# Patient Record
Sex: Female | Born: 1940 | Race: White | Hispanic: No | Marital: Married | Smoking: Never smoker
Health system: Southern US, Community
[De-identification: ages and names within clinical notes are randomized; demographics above are authoritative.]

## PROBLEM LIST (undated history)

## (undated) DIAGNOSIS — F419 Anxiety disorder, unspecified: Secondary | ICD-10-CM

## (undated) DIAGNOSIS — M810 Age-related osteoporosis without current pathological fracture: Secondary | ICD-10-CM

## (undated) DIAGNOSIS — T8859XA Other complications of anesthesia, initial encounter: Secondary | ICD-10-CM

## (undated) DIAGNOSIS — I10 Essential (primary) hypertension: Secondary | ICD-10-CM

## (undated) DIAGNOSIS — S52202A Unspecified fracture of shaft of left ulna, initial encounter for closed fracture: Secondary | ICD-10-CM

## (undated) DIAGNOSIS — E119 Type 2 diabetes mellitus without complications: Secondary | ICD-10-CM

## (undated) DIAGNOSIS — T4145XA Adverse effect of unspecified anesthetic, initial encounter: Secondary | ICD-10-CM

## (undated) DIAGNOSIS — M1990 Unspecified osteoarthritis, unspecified site: Secondary | ICD-10-CM

## (undated) DIAGNOSIS — E039 Hypothyroidism, unspecified: Secondary | ICD-10-CM

## (undated) DIAGNOSIS — M719 Bursopathy, unspecified: Secondary | ICD-10-CM

## (undated) DIAGNOSIS — S5292XA Unspecified fracture of left forearm, initial encounter for closed fracture: Secondary | ICD-10-CM

## (undated) DIAGNOSIS — C50919 Malignant neoplasm of unspecified site of unspecified female breast: Secondary | ICD-10-CM

## (undated) DIAGNOSIS — Z9889 Other specified postprocedural states: Secondary | ICD-10-CM

## (undated) DIAGNOSIS — F32A Depression, unspecified: Secondary | ICD-10-CM

## (undated) DIAGNOSIS — F329 Major depressive disorder, single episode, unspecified: Secondary | ICD-10-CM

## (undated) DIAGNOSIS — E079 Disorder of thyroid, unspecified: Secondary | ICD-10-CM

## (undated) DIAGNOSIS — K219 Gastro-esophageal reflux disease without esophagitis: Secondary | ICD-10-CM

## (undated) DIAGNOSIS — E785 Hyperlipidemia, unspecified: Secondary | ICD-10-CM

## (undated) DIAGNOSIS — R112 Nausea with vomiting, unspecified: Secondary | ICD-10-CM

## (undated) HISTORY — DX: Malignant neoplasm of unspecified site of unspecified female breast: C50.919

## (undated) HISTORY — DX: Essential (primary) hypertension: I10

## (undated) HISTORY — DX: Major depressive disorder, single episode, unspecified: F32.9

## (undated) HISTORY — DX: Bursopathy, unspecified: M71.9

## (undated) HISTORY — DX: Depression, unspecified: F32.A

## (undated) HISTORY — DX: Age-related osteoporosis without current pathological fracture: M81.0

## (undated) HISTORY — DX: Type 2 diabetes mellitus without complications: E11.9

## (undated) HISTORY — PX: CHOLECYSTECTOMY: SHX55

## (undated) HISTORY — DX: Disorder of thyroid, unspecified: E07.9

## (undated) HISTORY — DX: Hyperlipidemia, unspecified: E78.5

---

## 1981-08-23 DIAGNOSIS — C50919 Malignant neoplasm of unspecified site of unspecified female breast: Secondary | ICD-10-CM

## 1981-08-23 HISTORY — DX: Malignant neoplasm of unspecified site of unspecified female breast: C50.919

## 1981-08-23 HISTORY — PX: MASTECTOMY: SHX3

## 1988-08-23 HISTORY — PX: CATARACT EXTRACTION: SUR2

## 1998-12-17 ENCOUNTER — Encounter: Payer: Self-pay | Admitting: Ophthalmology

## 1998-12-17 ENCOUNTER — Ambulatory Visit (HOSPITAL_COMMUNITY): Admission: RE | Admit: 1998-12-17 | Discharge: 1998-12-18 | Payer: Self-pay | Admitting: Ophthalmology

## 1999-05-08 ENCOUNTER — Ambulatory Visit (HOSPITAL_COMMUNITY): Admission: RE | Admit: 1999-05-08 | Discharge: 1999-05-09 | Payer: Self-pay | Admitting: Surgery

## 1999-05-08 ENCOUNTER — Encounter: Payer: Self-pay | Admitting: Surgery

## 2007-10-10 ENCOUNTER — Ambulatory Visit: Payer: Self-pay | Admitting: General Surgery

## 2007-10-10 HISTORY — PX: BREAST BIOPSY: SHX20

## 2010-12-01 ENCOUNTER — Other Ambulatory Visit: Payer: Self-pay | Admitting: *Deleted

## 2010-12-01 DIAGNOSIS — Z79899 Other long term (current) drug therapy: Secondary | ICD-10-CM

## 2010-12-01 DIAGNOSIS — E7800 Pure hypercholesterolemia, unspecified: Secondary | ICD-10-CM

## 2010-12-02 ENCOUNTER — Ambulatory Visit: Payer: Self-pay | Admitting: Cardiology

## 2010-12-02 ENCOUNTER — Other Ambulatory Visit (INDEPENDENT_AMBULATORY_CARE_PROVIDER_SITE_OTHER): Payer: Self-pay | Admitting: *Deleted

## 2010-12-02 DIAGNOSIS — E7800 Pure hypercholesterolemia, unspecified: Secondary | ICD-10-CM

## 2010-12-02 DIAGNOSIS — Z79899 Other long term (current) drug therapy: Secondary | ICD-10-CM

## 2014-09-03 ENCOUNTER — Ambulatory Visit: Payer: Self-pay | Admitting: Family Medicine

## 2014-10-10 ENCOUNTER — Ambulatory Visit: Payer: Self-pay | Admitting: Family Medicine

## 2015-03-18 ENCOUNTER — Telehealth: Payer: Self-pay | Admitting: Family Medicine

## 2015-03-18 NOTE — Telephone Encounter (Signed)
Pt needs refill on all medications. Metformin,Glipizide, Lisinopril, Synthroid,Sertraline, Zithromax to be sent to [facility].

## 2015-03-18 NOTE — Telephone Encounter (Signed)
Spoke with Patient and she has refills on medication called [facility] and had them refilled.

## 2015-04-25 ENCOUNTER — Ambulatory Visit (INDEPENDENT_AMBULATORY_CARE_PROVIDER_SITE_OTHER): Payer: Medicare PPO | Admitting: Family Medicine

## 2015-04-25 ENCOUNTER — Encounter: Payer: Self-pay | Admitting: Family Medicine

## 2015-04-25 VITALS — BP 138/77 | HR 80 | Temp 98.2°F | Resp 18 | Ht 61.0 in | Wt 193.8 lb

## 2015-04-25 DIAGNOSIS — E785 Hyperlipidemia, unspecified: Secondary | ICD-10-CM

## 2015-04-25 DIAGNOSIS — Z9181 History of falling: Secondary | ICD-10-CM | POA: Insufficient documentation

## 2015-04-25 DIAGNOSIS — I10 Essential (primary) hypertension: Secondary | ICD-10-CM | POA: Diagnosis not present

## 2015-04-25 DIAGNOSIS — E119 Type 2 diabetes mellitus without complications: Secondary | ICD-10-CM

## 2015-04-25 DIAGNOSIS — F339 Major depressive disorder, recurrent, unspecified: Secondary | ICD-10-CM | POA: Insufficient documentation

## 2015-04-25 DIAGNOSIS — N951 Menopausal and female climacteric states: Secondary | ICD-10-CM | POA: Insufficient documentation

## 2015-04-25 DIAGNOSIS — E1169 Type 2 diabetes mellitus with other specified complication: Secondary | ICD-10-CM | POA: Insufficient documentation

## 2015-04-25 DIAGNOSIS — E1129 Type 2 diabetes mellitus with other diabetic kidney complication: Secondary | ICD-10-CM | POA: Insufficient documentation

## 2015-04-25 DIAGNOSIS — E039 Hypothyroidism, unspecified: Secondary | ICD-10-CM | POA: Insufficient documentation

## 2015-04-25 DIAGNOSIS — R809 Proteinuria, unspecified: Secondary | ICD-10-CM

## 2015-04-25 DIAGNOSIS — F329 Major depressive disorder, single episode, unspecified: Secondary | ICD-10-CM | POA: Insufficient documentation

## 2015-04-25 HISTORY — DX: Type 2 diabetes mellitus without complications: E11.9

## 2015-04-25 MED ORDER — LISINOPRIL 20 MG PO TABS: 20.0000 mg | ORAL_TABLET | Freq: Every day | ORAL | Status: DC

## 2015-04-25 MED ORDER — METFORMIN HCL 500 MG PO TABS: 1000.0000 mg | ORAL_TABLET | Freq: Two times a day (BID) | ORAL | Status: DC

## 2015-04-25 MED ORDER — ATORVASTATIN CALCIUM 40 MG PO TABS: 40.0000 mg | ORAL_TABLET | Freq: Every day | ORAL | Status: DC

## 2015-04-25 MED ORDER — GLIPIZIDE ER 2.5 MG PO TB24: 2.5000 mg | ORAL_TABLET | Freq: Every day | ORAL | Status: DC

## 2015-04-25 NOTE — Progress Notes (Signed)
Name: Lindsey [name]   MRN: [id number]    DOB: [birth year]   Date:04/25/2015       Progress Note  Subjective  Chief Complaint  Chief Complaint  Patient presents with  . Follow-up    3 mo  . Diabetes  . Depression  . Hyperlipidemia    Diabetes She presents for her follow-up diabetic visit. She has type 2 diabetes mellitus. Pertinent negatives for hypoglycemia include no headaches. Pertinent negatives for diabetes include no blurred vision, no chest pain and no fatigue. Pertinent negatives for diabetic complications include no CVA. Current diabetic treatment includes oral agent (dual therapy).  Hyperlipidemia This is a chronic problem. The problem is controlled. Recent lipid tests were reviewed and are normal. Pertinent negatives include no chest pain, leg pain, myalgias or shortness of breath. Current antihyperlipidemic treatment includes statins.  Hypertension This is a chronic problem. The problem is controlled. Pertinent negatives include no blurred vision, chest pain, headaches, palpitations or shortness of breath. Past treatments include ACE inhibitors. There is no history of kidney disease, CAD/MI or CVA.    Past Medical History  Diagnosis Date  . Depression   . Diabetes mellitus without complication   . Hyperlipidemia   . Osteoporosis     Past Surgical History  Procedure Laterality Date  . Mastectomy Right 1983    Family History  Problem Relation Age of Onset  . Kidney disease Mother   . Heart disease Father   . Stroke Sister   . Cancer Sister     breast  . Stroke Brother   . Diabetes Brother   . Hypertension Brother     Social History   Social History  . Marital Status: Married    Spouse Name: N/A  . Number of Children: N/A  . Years of Education: N/A   Occupational History  . Not on file.   Social History Main Topics  . Smoking status: Never Smoker   . Smokeless tobacco: Never Used  . Alcohol Use: No  . Drug Use: No  . Sexual Activity: No   Other  Topics Concern  . Not on file   Social History Narrative  . No narrative on file     Current outpatient prescriptions:  .  aspirin 81 MG tablet, Take by mouth., Disp: , Rfl:  .  atorvastatin (LIPITOR) 40 MG tablet, Take by mouth., Disp: , Rfl:  .  cholecalciferol (VITAMIN D) 1000 UNITS tablet, Take by mouth., Disp: , Rfl:  .  glipiZIDE (GLUCOTROL XL) 2.5 MG 24 hr tablet, Take by mouth., Disp: , Rfl:  .  lisinopril (PRINIVIL,ZESTRIL) 20 MG tablet, Take by mouth., Disp: , Rfl:  .  metFORMIN (GLUCOPHAGE) 500 MG tablet, Take by mouth., Disp: , Rfl:  .  mupirocin ointment (BACTROBAN) 2 %, BACTROBAN, 2% (External Ointment)  1 (one) Ointment two times daily for 30 days  Quantity: 45;  Refills: 2   Ordered :19-Jul-2012  Dione Booze ;  Started 18-Apr-2012 Active, Disp: , Rfl:  .  naproxen sodium (ALEVE) 220 MG tablet, Take by mouth., Disp: , Rfl:  .  sertraline (ZOLOFT) 100 MG tablet, Take by mouth., Disp: , Rfl:  .  SYNTHROID 112 MCG tablet, , Disp: , Rfl: 1  No Known Allergies   Review of Systems  Constitutional: Negative for fatigue.  Eyes: Negative for blurred vision.  Respiratory: Negative for shortness of breath.   Cardiovascular: Negative for chest pain and palpitations.  Musculoskeletal: Negative for myalgias.  Neurological: Negative for headaches.  Objective  Filed Vitals:   04/25/15 1006  BP: 138/77  Pulse: 80  Temp: 98.2 F (36.8 C)  TempSrc: Oral  Resp: 18  Height: 5\' 1"  (1.549 m)  Weight: 193 lb 12.8 oz (87.907 kg)  SpO2: 95%    Physical Exam  Constitutional: She is oriented to person, place, and time and well-developed, well-nourished, and in no distress.  Cardiovascular: Normal rate and regular rhythm.   Pulmonary/Chest: Effort normal and breath sounds normal.  Abdominal: Soft. Bowel sounds are normal.  Neurological: She is alert and oriented to person, place, and time.  Skin: Skin is warm and dry.  Nursing note and vitals reviewed.  Assessment &  Plan  1. Diabetes mellitus without complication  - glipiZIDE (GLUCOTROL XL) 2.5 MG 24 hr tablet; Take 1 tablet (2.5 mg total) by mouth daily with breakfast.  Dispense: 90 tablet; Refill: 1 - metFORMIN (GLUCOPHAGE) 500 MG tablet; Take 2 tablets (1,000 mg total) by mouth 2 (two) times daily with a meal.  Dispense: 360 tablet; Refill: 1 - HgB A1c  2. Hyperlipidemia  - atorvastatin (LIPITOR) 40 MG tablet; Take 1 tablet (40 mg total) by mouth daily at 6 PM.  Dispense: 90 tablet; Refill: 1 - Lipid Profile - Comprehensive Metabolic Panel (CMET)  3. Benign hypertension  - lisinopril (PRINIVIL,ZESTRIL) 20 MG tablet; Take 1 tablet (20 mg total) by mouth daily.  Dispense: 90 tablet; Refill: 1   Trameka Dorough [facility] 04/25/2015 10:18 AM

## 2015-04-26 LAB — LIPID PANEL
CHOL/HDL RATIO: 4.4 ratio (ref 0.0–4.4)
Cholesterol, Total: 227 mg/dL — ABNORMAL HIGH (ref 100–199)
HDL: 52 mg/dL (ref >39)
LDL Calculated: 142 mg/dL — ABNORMAL HIGH (ref 0–99)
Triglycerides: 167 mg/dL — ABNORMAL HIGH (ref 0–149)
VLDL CHOLESTEROL CAL: 33 mg/dL (ref 5–40)

## 2015-04-26 LAB — COMPREHENSIVE METABOLIC PANEL
ALK PHOS: 57 IU/L (ref 39–117)
ALT: 16 IU/L (ref 0–32)
AST: 19 IU/L (ref 0–40)
Albumin/Globulin Ratio: 1.9 (ref 1.1–2.5)
Albumin: 4.7 g/dL (ref 3.5–4.8)
BILIRUBIN TOTAL: 0.7 mg/dL (ref 0.0–1.2)
BUN/Creatinine Ratio: 20 (ref 11–26)
BUN: 17 mg/dL (ref 8–27)
CHLORIDE: 102 mmol/L (ref 97–108)
CO2: 26 mmol/L (ref 18–29)
Calcium: 9.9 mg/dL (ref 8.7–10.3)
Creatinine, Ser: 0.85 mg/dL (ref 0.57–1.00)
GFR calc Af Amer: 78 mL/min/{1.73_m2} (ref >59)
GFR calc non Af Amer: 67 mL/min/{1.73_m2} (ref >59)
GLUCOSE: 136 mg/dL — AB (ref 65–99)
Globulin, Total: 2.5 g/dL (ref 1.5–4.5)
Potassium: 5.8 mmol/L — ABNORMAL HIGH (ref 3.5–5.2)
Sodium: 145 mmol/L — ABNORMAL HIGH (ref 134–144)
TOTAL PROTEIN: 7.2 g/dL (ref 6.0–8.5)

## 2015-04-26 LAB — HEMOGLOBIN A1C
Est. average glucose Bld gHb Est-mCnc: 140 mg/dL
HEMOGLOBIN A1C: 6.5 % — AB (ref 4.8–5.6)

## 2015-05-01 ENCOUNTER — Other Ambulatory Visit: Payer: Self-pay | Admitting: Family Medicine

## 2015-05-01 DIAGNOSIS — E875 Hyperkalemia: Secondary | ICD-10-CM

## 2015-05-08 ENCOUNTER — Other Ambulatory Visit: Payer: Self-pay | Admitting: Family Medicine

## 2015-05-09 LAB — BASIC METABOLIC PANEL
BUN/Creatinine Ratio: 20 (ref 11–26)
BUN: 17 mg/dL (ref 8–27)
CO2: 26 mmol/L (ref 18–29)
CREATININE: 0.84 mg/dL (ref 0.57–1.00)
Calcium: 9.7 mg/dL (ref 8.7–10.3)
Chloride: 103 mmol/L (ref 97–108)
GFR calc Af Amer: 79 mL/min/{1.73_m2} (ref >59)
GFR, EST NON AFRICAN AMERICAN: 68 mL/min/{1.73_m2} (ref >59)
Glucose: 152 mg/dL — ABNORMAL HIGH (ref 65–99)
Potassium: 4.7 mmol/L (ref 3.5–5.2)
SODIUM: 146 mmol/L — AB (ref 134–144)

## 2015-05-19 ENCOUNTER — Telehealth: Payer: Self-pay | Admitting: Family Medicine

## 2015-05-19 NOTE — Telephone Encounter (Signed)
Requesting return call needing test results.

## 2015-05-21 MED ORDER — SYNTHROID 112 MCG PO TABS: 112.0000 ug | ORAL_TABLET | Freq: Every day | ORAL | Status: DC

## 2015-05-21 NOTE — Telephone Encounter (Signed)
Medication has been refilled and sent to [facility] 

## 2015-06-09 ENCOUNTER — Ambulatory Visit (INDEPENDENT_AMBULATORY_CARE_PROVIDER_SITE_OTHER): Payer: Medicare PPO | Admitting: Family Medicine

## 2015-06-09 ENCOUNTER — Encounter: Payer: Self-pay | Admitting: Family Medicine

## 2015-06-09 VITALS — BP 136/77 | HR 79 | Temp 98.6°F | Resp 18 | Ht 61.0 in | Wt 195.2 lb

## 2015-06-09 DIAGNOSIS — Z23 Encounter for immunization: Secondary | ICD-10-CM | POA: Diagnosis not present

## 2015-06-09 NOTE — Progress Notes (Signed)
This encounter was created in error - please disregard.

## 2015-08-26 ENCOUNTER — Ambulatory Visit (INDEPENDENT_AMBULATORY_CARE_PROVIDER_SITE_OTHER): Payer: Medicare PPO | Admitting: Family Medicine

## 2015-08-26 ENCOUNTER — Encounter: Payer: Self-pay | Admitting: Family Medicine

## 2015-08-26 VITALS — BP 138/71 | HR 76 | Temp 98.5°F | Resp 17 | Ht 61.0 in | Wt 194.5 lb

## 2015-08-26 DIAGNOSIS — I10 Essential (primary) hypertension: Secondary | ICD-10-CM | POA: Diagnosis not present

## 2015-08-26 DIAGNOSIS — E785 Hyperlipidemia, unspecified: Secondary | ICD-10-CM | POA: Diagnosis not present

## 2015-08-26 DIAGNOSIS — IMO0001 Reserved for inherently not codable concepts without codable children: Secondary | ICD-10-CM

## 2015-08-26 DIAGNOSIS — E119 Type 2 diabetes mellitus without complications: Secondary | ICD-10-CM

## 2015-08-26 LAB — GLUCOSE, POCT (MANUAL RESULT ENTRY): POC Glucose: 150 mg/dl — AB (ref 70–99)

## 2015-08-26 LAB — POCT GLYCOSYLATED HEMOGLOBIN (HGB A1C): Hemoglobin A1C: 6.9

## 2015-08-26 NOTE — Progress Notes (Signed)
Name: Lindsey [name]   MRN: [id number]    DOB: 1941-07-03   Date:08/26/2015       Progress Note  Subjective  Chief Complaint  Chief Complaint  Patient presents with  . Diabetes  . Hypertension  . Hyperlipidemia    Diabetes She presents for her follow-up diabetic visit. She has type 2 diabetes mellitus. Her disease course has been stable. Pertinent negatives for hypoglycemia include no headaches. (Sometimes if she goes too long without eating, her blood glucose will drop.) Pertinent negatives for diabetes include no blurred vision, no chest pain, no fatigue, no foot paresthesias, no polydipsia and no polyuria. Pertinent negatives for diabetic complications include no autonomic neuropathy, CVA or heart disease. Current diabetic treatment includes oral agent (monotherapy). She is following a diabetic and generally healthy diet. She has not had a previous visit with a dietitian. She rarely participates in exercise. Frequency home blood tests: Does not check her Blood Glucose at home. There is no change in her home blood glucose trend. An ACE inhibitor/angiotensin II receptor blocker is being taken. Eye exam is current (November 2016).  Hypertension This is a chronic problem. The problem is controlled. Pertinent negatives include no blurred vision, chest pain, headaches, palpitations or shortness of breath. Past treatments include ACE inhibitors. There is no history of kidney disease, CAD/MI or CVA.  Hyperlipidemia This is a chronic problem. The problem is controlled. Recent lipid tests were reviewed and are high. Pertinent negatives include no chest pain, leg pain, myalgias or shortness of breath. Current antihyperlipidemic treatment includes statins.    Past Medical History  Diagnosis Date  . Depression   . Diabetes mellitus without complication ([place])   . Hyperlipidemia   . Osteoporosis     Past Surgical History  Procedure Laterality Date  . Mastectomy Right 1983    Family History  Problem  Relation Age of Onset  . Kidney disease Mother   . Heart disease Father   . Stroke Sister   . Cancer Sister     breast  . Stroke Brother   . Diabetes Brother   . Hypertension Brother     Social History   Social History  . Marital Status: Married    Spouse Name: N/A  . Number of Children: N/A  . Years of Education: N/A   Occupational History  . Not on file.   Social History Main Topics  . Smoking status: Never Smoker   . Smokeless tobacco: Never Used  . Alcohol Use: No  . Drug Use: No  . Sexual Activity: No   Other Topics Concern  . Not on file   Social History Narrative    Current outpatient prescriptions:  .  aspirin 81 MG tablet, Take by mouth., Disp: , Rfl:  .  atorvastatin (LIPITOR) 40 MG tablet, Take 1 tablet (40 mg total) by mouth daily at 6 PM., Disp: 90 tablet, Rfl: 1 .  cholecalciferol (VITAMIN D) 1000 UNITS tablet, Take by mouth., Disp: , Rfl:  .  ketoconazole (NIZORAL) 2 % cream, APPLY TO THE SKIN TWICE A DAY TO INFRAMAMMARY AND GROIN AS DIRECTED, Disp: , Rfl: 0 .  lisinopril (PRINIVIL,ZESTRIL) 20 MG tablet, Take 1 tablet (20 mg total) by mouth daily., Disp: 90 tablet, Rfl: 1 .  metFORMIN (GLUCOPHAGE) 500 MG tablet, Take 2 tablets (1,000 mg total) by mouth 2 (two) times daily with a meal., Disp: 360 tablet, Rfl: 1 .  mupirocin ointment (BACTROBAN) 2 %, BACTROBAN, 2% (External Ointment)  1 (one) Ointment  two times daily for 30 days  Quantity: 45;  Refills: 2   Ordered :19-Jul-2012  Lindsey [name] ;  Started 18-Apr-2012 Active, Disp: , Rfl:  .  naproxen sodium (ALEVE) 220 MG tablet, Take by mouth., Disp: , Rfl:  .  sertraline (ZOLOFT) 100 MG tablet, Take by mouth., Disp: , Rfl:  .  SYNTHROID 112 MCG tablet, Take 1 tablet (112 mcg total) by mouth daily., Disp: 90 tablet, Rfl: 2  No Known Allergies   Review of Systems  Constitutional: Negative for fatigue.  Eyes: Negative for blurred vision.  Respiratory: Negative for shortness of breath.   Cardiovascular:  Negative for chest pain and palpitations.  Musculoskeletal: Negative for myalgias.  Neurological: Negative for headaches.  Endo/Heme/Allergies: Negative for polydipsia.    Objective  Filed Vitals:   08/26/15 0908  BP: 138/71  Pulse: 76  Temp: 98.5 F (36.9 C)  TempSrc: Oral  Resp: 17  Height: 5\' 1"  (1.549 m)  Weight: 194 lb 8 oz (88.225 kg)  SpO2: 97%    Physical Exam  Constitutional: She is oriented to person, place, and time and well-developed, well-nourished, and in no distress.  HENT:  Head: Normocephalic and atraumatic.  Cardiovascular: Normal rate, regular rhythm and normal heart sounds.   No murmur heard. Pulmonary/Chest: Effort normal.  Abdominal: Soft. Bowel sounds are normal.  Musculoskeletal: Normal range of motion. She exhibits no edema.  Neurological: She is alert and oriented to person, place, and time.  Skin: Skin is warm and dry.  Nursing note and vitals reviewed.    Assessment & Plan  1. Benign hypertension BP at goal.  2. Diabetes mellitus without complication (HCC) [id number], well controlled DM.  - POCT Glucose (CBG) - POCT HgB A1C - Urine Microalbumin w/creat. ratio  3. Hyperlipidemia  - Comprehensive Metabolic Panel (CMET) - Lipid Profile  4. Obesity, Class II, BMI 35-39.9, with comorbidity ([place]) referral to [facility] for management and treatment of obesity and diabetes. - Amb ref to Medical Nutrition Therapy-MNT   Lindsey [name] Lindsey A. Lindsey [name] 08/26/2015 9:45 AM

## 2015-08-27 LAB — LIPID PANEL
CHOLESTEROL TOTAL: 125 mg/dL (ref 100–199)
Chol/HDL Ratio: 2.6 ratio units (ref 0.0–4.4)
HDL: 49 mg/dL (ref >39)
LDL CALC: 49 mg/dL (ref 0–99)
TRIGLYCERIDES: 134 mg/dL (ref 0–149)
VLDL CHOLESTEROL CAL: 27 mg/dL (ref 5–40)

## 2015-08-27 LAB — COMPREHENSIVE METABOLIC PANEL
ALBUMIN: 4.6 g/dL (ref 3.5–4.8)
ALK PHOS: 53 IU/L (ref 39–117)
ALT: 16 IU/L (ref 0–32)
AST: 17 IU/L (ref 0–40)
Albumin/Globulin Ratio: 2.2 (ref 1.1–2.5)
BUN/Creatinine Ratio: 18 (ref 11–26)
BUN: 13 mg/dL (ref 8–27)
Bilirubin Total: 0.6 mg/dL (ref 0.0–1.2)
CO2: 27 mmol/L (ref 18–29)
CREATININE: 0.72 mg/dL (ref 0.57–1.00)
Calcium: 9.6 mg/dL (ref 8.7–10.3)
Chloride: 102 mmol/L (ref 96–106)
GFR calc Af Amer: 95 mL/min/{1.73_m2} (ref >59)
GFR calc non Af Amer: 82 mL/min/{1.73_m2} (ref >59)
GLUCOSE: 144 mg/dL — AB (ref 65–99)
Globulin, Total: 2.1 g/dL (ref 1.5–4.5)
Potassium: 4.8 mmol/L (ref 3.5–5.2)
Sodium: 145 mmol/L — ABNORMAL HIGH (ref 134–144)
Total Protein: 6.7 g/dL (ref 6.0–8.5)

## 2015-08-27 LAB — MICROALBUMIN / CREATININE URINE RATIO
CREATININE, UR: 85.9 mg/dL
MICROALB/CREAT RATIO: 37.6 mg/g{creat} — AB (ref 0.0–30.0)
MICROALBUM., U, RANDOM: 32.3 ug/mL

## 2015-09-12 ENCOUNTER — Encounter: Payer: Medicare PPO | Attending: Family Medicine | Admitting: Dietician

## 2015-09-12 VITALS — Ht 61.0 in | Wt 190.7 lb

## 2015-09-12 DIAGNOSIS — E119 Type 2 diabetes mellitus without complications: Secondary | ICD-10-CM | POA: Diagnosis not present

## 2015-09-12 DIAGNOSIS — E669 Obesity, unspecified: Secondary | ICD-10-CM

## 2015-09-12 NOTE — Progress Notes (Signed)
Medical Nutrition Therapy: Visit start time: 1100  end time: 1200  Assessment:  Diagnosis: Diabetes Past medical history: HTN, hyperlipidemia Psychosocial issues/ stress concerns: none; she is taking antidepressant Preferred learning method:  Nicki [place] . Hands-on  Current weight: 190.7lbs with shoes  Height: 5'1" Medications, supplements: reviewed list in chart with patient  Progress and evaluation: Patient reports several year history of Diabetes.          She states she does sometimes have low BG symptoms when she waits too long to eat.           She has been making some diet changes to improve blood sugars and lose weight; she reports weight loss of several pounds recently.   Physical activity: occasional walking, not on a regular basis  Dietary Intake:  Usual eating pattern includes 2-3 meals and 1-2 snacks per day. Dining out frequency: 3 meals per week.  Breakfast: cereal, Special K, corn flakes, cheerios, bran flakes with unsweetened soy milk Snack: sometimes orange or apple Lunch: pkg peanut butter crackers Snack: crackers or pretzels or chips Supper: vegetables and beans, mostly chicken or pork chop, occasionally salmon or other fish Snack: usually none Beverages: water or flavored water, Sugar free Nature's twist, diet pepsi dr pepper root beer; avoids juices.   Nutrition Care Education: Topics covered: Diabetes, weight control Basic nutrition: basic food groups, appropriate nutrient balance, appropriate meal and snack schedule, general nutrition guidelines    Weight control: benefits of weight control, guidelines for food portions to promote weight loss Diabetes:  goals for BGs and HbA1C, appropriate carb intake and balance, importance of protein sources with each meal, symptoms and treatment of low BGs.  Other lifestyle changes:  Role of physical activity in controlling BGs and weight. Discussed options for light exercise, including chair exercises.   Nutritional  Diagnosis:  NI-5.5 Imbalance of nutrients As related to some meals lacking protein sources.  As evidenced by patient report.  Intervention: Instruction as noted above.   Set goals with patient input to include protein with meals for adequate intake and to help prevent low BGs.   Encouraged light exercise.   Advised patient to plan for snacks when physically active and when away from home for several hours.  Education Materials given:  . General diet guidelines for Diabetes . Plate Planner . Food lists/ plate-method food lists . Sample meal pattern/ menus: Quick and Healthy Meal Ideas . Snacking handout . Goals/ instructions  Learner/ who was taught:  . Patient   Level of understanding: [name] Verbalizes/ demonstrates competency   Demonstrated degree of understanding via:   Teach back Learning barriers: . None   Willingness to learn/ readiness for change: . Eager, change in progress   Monitoring and Evaluation:  Dietary intake, exercise, BG control, and body weight      follow up: 10/13/15

## 2015-09-12 NOTE — Patient Instructions (Addendum)
   Measure portions of starchy foods, and aim for 1 cup or less.   If you eat 2/3 cup of starch or less with your meal, you can add a serving of fruit.   Eat a protein food with your breakfast -- you can add 1/4 cup nuts with your cereal, or 1-2 boiled eggs. Or have scrambled eggs and grits.  Make sure to eat a small easy meal or a balanced snack at lunchtime. You can try cracks and lowfat cheese with a fruit, or a small bowl of soup with a salad, or a serving of beans with a vegetable, or a sandwich with peanut butter or [place] and a fruit.   "100% whole grain" is the healthiest choice for bread. If not that, try white wheat bread.   Keep walking as often as possible, or spend 15 or more minutes exercising your legs and arms while sitting in a chair.

## 2015-09-16 ENCOUNTER — Other Ambulatory Visit: Payer: Self-pay | Admitting: Family Medicine

## 2015-09-17 NOTE — Telephone Encounter (Signed)
Medication has been refilled and sent to [facility]

## 2015-09-29 ENCOUNTER — Ambulatory Visit (INDEPENDENT_AMBULATORY_CARE_PROVIDER_SITE_OTHER): Payer: Medicare PPO | Admitting: Family Medicine

## 2015-09-29 ENCOUNTER — Encounter: Payer: Self-pay | Admitting: Family Medicine

## 2015-09-29 VITALS — BP 142/80 | HR 82 | Temp 97.9°F | Resp 16 | Wt 194.0 lb

## 2015-09-29 DIAGNOSIS — N644 Mastodynia: Secondary | ICD-10-CM

## 2015-09-29 DIAGNOSIS — M810 Age-related osteoporosis without current pathological fracture: Secondary | ICD-10-CM

## 2015-09-29 DIAGNOSIS — Z1211 Encounter for screening for malignant neoplasm of colon: Secondary | ICD-10-CM

## 2015-09-29 DIAGNOSIS — Z0000 Encounter for general adult medical examination without abnormal findings: Secondary | ICD-10-CM

## 2015-09-29 HISTORY — DX: Age-related osteoporosis without current pathological fracture: M81.0

## 2015-09-29 MED ORDER — ALENDRONATE SODIUM 70 MG PO TABS: 70.0000 mg | ORAL_TABLET | ORAL | Status: DC

## 2015-09-29 MED ORDER — METFORMIN HCL 500 MG PO TABS: 500.0000 mg | ORAL_TABLET | Freq: Four times a day (QID) | ORAL | Status: DC

## 2015-09-29 NOTE — Progress Notes (Signed)
Name: Lindsey [name]   MRN: [id number]    DOB: Nov 12, 1940   Date:09/29/2015       Progress Note  Subjective  Chief Complaint  Chief Complaint  Patient presents with  . Annual Exam    HPI   Pt. Is here for annual physical exam. She is doing well. Last mammogram in January 2016, BI-RADS Cat. 1 Last Dexa Scan in February 2016, showed Osteoporosis at L-Spine, this was never reviewed by me and was scanned into the patient's chart by the CMA at that time. Never had Colonoscopy. Lipid Panel obtained in January 2017, is at goal.  Past Medical History  Diagnosis Date  . Depression   . Diabetes mellitus without complication ([place])   . Hyperlipidemia   . Osteoporosis     Past Surgical History  Procedure Laterality Date  . Mastectomy Right 1983    Family History  Problem Relation Age of Onset  . Kidney disease Mother   . Heart disease Father   . Stroke Sister   . Cancer Sister     breast  . Stroke Brother   . Diabetes Brother   . Hypertension Brother     Social History   Social History  . Marital Status: Married    Spouse Name: N/A  . Number of Children: N/A  . Years of Education: N/A   Occupational History  . Not on file.   Social History Main Topics  . Smoking status: Never Smoker   . Smokeless tobacco: Never Used  . Alcohol Use: No  . Drug Use: No  . Sexual Activity: No   Other Topics Concern  . Not on file   Social History Narrative     Current outpatient prescriptions:  .  alendronate (FOSAMAX) 70 MG tablet, Take 1 tablet (70 mg total) by mouth once a week. Take with a full glass of water on an empty stomach., Disp: 52 tablet, Rfl: 0 .  aspirin 81 MG tablet, Take by mouth., Disp: , Rfl:  .  atorvastatin (LIPITOR) 40 MG tablet, Take 1 tablet (40 mg total) by mouth daily., Disp: 90 tablet, Rfl: 1 .  cholecalciferol (VITAMIN D) 1000 UNITS tablet, Take by mouth., Disp: , Rfl:  .  ketoconazole (NIZORAL) 2 % cream, APPLY TO THE SKIN TWICE A DAY TO INFRAMAMMARY  AND GROIN AS DIRECTED, Disp: , Rfl: 0 .  lisinopril (PRINIVIL,ZESTRIL) 20 MG tablet, Take 1 tablet (20 mg total) by mouth daily., Disp: 90 tablet, Rfl: 1 .  metFORMIN (GLUCOPHAGE) 500 MG tablet, Take 1 tablet (500 mg total) by mouth 2 (two) times daily with a meal., Disp: 360 tablet, Rfl: 1 .  metFORMIN (GLUCOPHAGE) 500 MG tablet, Take 1 tablet (500 mg total) by mouth 4 (four) times daily., Disp: 360 tablet, Rfl: 3 .  mupirocin ointment (BACTROBAN) 2 %, Reported on 09/12/2015, Disp: , Rfl:  .  naproxen sodium (ALEVE) 220 MG tablet, Take by mouth., Disp: , Rfl:  .  sertraline (ZOLOFT) 100 MG tablet, Take by mouth., Disp: , Rfl:  .  SYNTHROID 112 MCG tablet, Take 1 tablet (112 mcg total) by mouth daily., Disp: 90 tablet, Rfl: 2  No Known Allergies   Review of Systems  Constitutional: Negative for fever, chills and weight loss.  HENT: Negative for sore throat.   Eyes: Negative for blurred vision and double vision.  Respiratory: Negative for cough, sputum production and shortness of breath.   Cardiovascular: Negative for chest pain.  Gastrointestinal: Positive for heartburn (frequent heartburn).  Negative for nausea, vomiting, abdominal pain, blood in stool and melena.  Genitourinary: Negative for dysuria and hematuria.  Musculoskeletal: Positive for back pain (occasional back pain). Negative for myalgias, joint pain and neck pain.  Neurological: Negative for dizziness and headaches.  Psychiatric/Behavioral: Positive for depression. The patient is not nervous/anxious and does not have insomnia.      Objective  Filed Vitals:   09/29/15 0851  BP: 142/80  Pulse: 82  Temp: 97.9 F (36.6 C)  TempSrc: Oral  Resp: 16  Weight: 194 lb (87.998 kg)  SpO2: 95%    Physical Exam  Constitutional: She is oriented to person, place, and time and well-developed, well-nourished, and in no distress.  HENT:  Head: Normocephalic and atraumatic.  Eyes: Pupils are equal, round, and reactive to light.   Cardiovascular: Normal rate and regular rhythm.   Pulmonary/Chest: Effort normal and breath sounds normal. Left breast exhibits tenderness (mild tenderness over the 7-8 o clock position.in the left breast, no palpable mass.). Left breast exhibits no inverted nipple, no mass, no nipple discharge and no skin change.  R. Mastectomy in 1983, 2/2 breast cancer. L. Breast normal,   Abdominal: Soft. Bowel sounds are normal.  Neurological: She is alert and oriented to person, place, and time.  Nursing note and vitals reviewed.    Assessment & Plan  1. 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 Complains of one episode of bloody discharge from left nipple. We will order mammogram and a diagnostic ultrasound of left breast.  - MM Digital Diagnostic Unilat L; Future - US BREAST LTD UNI LEFT INC AXILLA; Future  2. Annual physical exam   3. Screening for colon cancer Never had colonoscopy and never wishes to have one. We will order Cologuard, which is agreeable to patient. - Cologuard  4. 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 We'll start on Fosamax for one year. Advised to take medication with a full glass of water and to stay upright for at least an hour following the administration. If patient develops GI symptoms, we'll consider discontinuation. Follow-up with repeat DEXA scan in March 2017. Patient verbalized agreement. - alendronate (FOSAMAX) 70 MG tablet; Take 1 tablet (70 mg total) by mouth once a week. Take with a full glass of water on an empty stomach.  Dispense: 52 tablet; Refill: 0   Rosalie Gelpi [facility] 09/29/2015 8:15 PM

## 2015-10-06 ENCOUNTER — Other Ambulatory Visit: Payer: Medicare PPO

## 2015-10-06 ENCOUNTER — Ambulatory Visit: Payer: Medicare PPO

## 2015-10-10 ENCOUNTER — Ambulatory Visit
Admission: RE | Admit: 2015-10-10 | Discharge: 2015-10-10 | Disposition: A | Discharge status: Home / Self Care | Payer: Medicare PPO | Source: Ambulatory Visit | Attending: Family Medicine | Admitting: Family Medicine

## 2015-10-10 ENCOUNTER — Other Ambulatory Visit: Payer: Self-pay | Admitting: Family Medicine

## 2015-10-10 DIAGNOSIS — Z853 Personal history of malignant neoplasm of breast: Secondary | ICD-10-CM | POA: Diagnosis not present

## 2015-10-10 DIAGNOSIS — N63 Unspecified lump in breast: Secondary | ICD-10-CM | POA: Insufficient documentation

## 2015-10-10 DIAGNOSIS — N644 Mastodynia: Secondary | ICD-10-CM | POA: Diagnosis present

## 2015-10-10 HISTORY — DX: Malignant neoplasm of unspecified site of unspecified female breast: C50.919

## 2015-10-13 ENCOUNTER — Encounter: Payer: Medicare PPO | Attending: Family Medicine | Admitting: Dietician

## 2015-10-13 VITALS — Ht 61.0 in | Wt 192.0 lb

## 2015-10-13 DIAGNOSIS — E119 Type 2 diabetes mellitus without complications: Secondary | ICD-10-CM | POA: Diagnosis not present

## 2015-10-13 DIAGNOSIS — E669 Obesity, unspecified: Secondary | ICD-10-CM

## 2015-10-13 NOTE — Progress Notes (Signed)
Medical Nutrition Therapy: Visit start time: 0930  end time: 1000  Assessment:  Diagnosis: Diabetes Medical history changes: no changes Psychosocial issues/ stress concerns: none  Current weight: 192.0lbs  Height: 5'1" Medications, supplement changes: no changes per patient  Progress and evaluation: Patient reports no particular diet changes since last visit on 09/12/15.         She still reports occasional low blood sugar symptoms in afternoons.          Weight has remained stable, less than 1lb change since previous visit.   Physical activity: some walking, not on a regular basis. States she stays busy all day.   Dietary Intake:  Usual eating pattern includes 2-3  meals and 2-3 snacks per day. Dining out frequency: 1-2 meals per week.  Breakfast: cereal, Special K, corn flakes, cheerios, bran flakes with unsweetened soy milk Snack: sometimes orange or apple Lunch: pkg peanut butter crackers Snack: crackers or pretzels or chips Supper: vegetables and beans, mostly chicken or pork chop, occasionally salmon or other fish Snack: usually none Beverages: water or flavored water, Sugar free Nature's twist, diet pepsi dr pepper root beer; avoids juices   Nutrition Care Education: Topics covered: diabetes, weight management Weight control: behavioral changes for weight loss: brief review of portion control, importance of physical activity Diabetes: appropriate meal and snack schedule, appropriate carb intake and balance, importance of adequate protein for BG control, prevention of low BGs, and preserving muscle.   Nutritional Diagnosis:  [place]-3.3 Overweight/obesity As related to hypothyroidism, history of excess calories.  As evidenced by patient report.  Intervention: Discussion as noted above.   Reviewed importance of protein and controlled starch portions.    Encouraged ongoing regular physical activity.   Education Materials given:  [name] Sample meal pattern/ menus . Goals/  instructions  Learner/ who was taught:  . Patient   Level of understanding: [name] Verbalizes/ demonstrates competency   Demonstrated degree of understanding via:   Teach back Learning barriers: . None  Willingness to learn/ readiness for change: . Eager, change in progress   Monitoring and Evaluation:  Dietary intake, exercise, BG control, and body weight      follow up: prn

## 2015-10-13 NOTE — Patient Instructions (Signed)
   Make sure to have some protein food in the morning and at lunchtime.   If you eat a vegetable plate for your meal, make sure to have beans like pintos as one of your choices.   Keep trying to build up time spent walking or otherwise active.

## 2015-10-24 ENCOUNTER — Encounter: Payer: Self-pay | Admitting: Family Medicine

## 2015-10-27 ENCOUNTER — Ambulatory Visit: Payer: Medicare PPO | Admitting: Family Medicine

## 2015-10-28 ENCOUNTER — Other Ambulatory Visit: Payer: Self-pay | Admitting: Family Medicine

## 2015-10-28 DIAGNOSIS — N63 Unspecified lump in unspecified breast: Secondary | ICD-10-CM

## 2015-11-04 ENCOUNTER — Ambulatory Visit
Admission: RE | Admit: 2015-11-04 | Discharge: 2015-11-04 | Disposition: A | Discharge status: Home / Self Care | Payer: Medicare PPO | Source: Ambulatory Visit | Attending: Family Medicine | Admitting: Family Medicine

## 2015-11-04 DIAGNOSIS — N63 Unspecified lump in unspecified breast: Secondary | ICD-10-CM

## 2015-11-04 DIAGNOSIS — C50912 Malignant neoplasm of unspecified site of left female breast: Secondary | ICD-10-CM | POA: Insufficient documentation

## 2015-11-04 DIAGNOSIS — C50919 Malignant neoplasm of unspecified site of unspecified female breast: Secondary | ICD-10-CM

## 2015-11-04 HISTORY — DX: Malignant neoplasm of unspecified site of unspecified female breast: C50.919

## 2015-11-04 HISTORY — PX: BREAST BIOPSY: SHX20

## 2015-11-05 ENCOUNTER — Other Ambulatory Visit: Payer: Self-pay | Admitting: Family Medicine

## 2015-11-05 DIAGNOSIS — C50912 Malignant neoplasm of unspecified site of left female breast: Secondary | ICD-10-CM | POA: Insufficient documentation

## 2015-11-05 DIAGNOSIS — C50412 Malignant neoplasm of upper-outer quadrant of left female breast: Secondary | ICD-10-CM

## 2015-11-06 LAB — SURGICAL PATHOLOGY

## 2015-11-13 ENCOUNTER — Encounter: Payer: Self-pay | Admitting: *Deleted

## 2015-11-13 ENCOUNTER — Telehealth: Payer: Self-pay | Admitting: Family Medicine

## 2015-11-13 NOTE — Progress Notes (Signed)
  Oncology Nurse Navigator Documentation  Navigator Location: [facility] (11/13/15 1400) Navigator Encounter Type: Introductory phone call (11/13/15 1400)   Abnormal Finding Date: 10/10/15 (11/13/15 1400) Confirmed Diagnosis Date: 11/04/15 (11/13/15 1400)     Patient Visit Type: Initial (11/13/15 1400)                    Acuity: Level 2 (11/13/15 1400)   Acuity Level 2: Initial guidance, education and coordination as needed (11/13/15 1400)     Time Spent with Patient: 30 (11/13/15 1400)   Introduced IT trainer.  Patient has initial consult with Dr. Jamal Collin on 11/20/15 .  Will deliver [facility] Handbook/folder with hospital services to office unless patient calls ,and wants to review information .  Collected information for case conference.  Patient has history of right mastectomy in 1983.  Her 35 year old daughter was diagnosed with Breast Cancer 1 year ago.

## 2015-11-13 NOTE — Telephone Encounter (Signed)
Made appointment for pt at [facility] with Dr. Jamal Collin for 11/18/15 @2  and called to inform pt. Pt stated that she needed a morning appointment so I gave her the number to call and reschedule appointment for a time that fits her schedule.

## 2015-11-13 NOTE — Telephone Encounter (Signed)
The Navigant from [facility] called wanting to know if there was a course of treatment for patient based on the findings of ultrasound and mammogram results.

## 2015-11-18 ENCOUNTER — Ambulatory Visit: Payer: Self-pay | Admitting: General Surgery

## 2015-11-20 ENCOUNTER — Encounter: Payer: Self-pay | Admitting: *Deleted

## 2015-11-20 ENCOUNTER — Ambulatory Visit (INDEPENDENT_AMBULATORY_CARE_PROVIDER_SITE_OTHER): Payer: Medicare PPO | Admitting: General Surgery

## 2015-11-20 ENCOUNTER — Encounter: Payer: Self-pay | Admitting: General Surgery

## 2015-11-20 VITALS — BP 178/88 | HR 90 | Resp 16 | Ht 61.0 in | Wt 190.0 lb

## 2015-11-20 DIAGNOSIS — C50412 Malignant neoplasm of upper-outer quadrant of left female breast: Secondary | ICD-10-CM

## 2015-11-20 NOTE — Progress Notes (Signed)
Patient ID: Lindsey [name], female   DOB: Mar 14, 1941, 75 y.o.   MRN: [id number]  Chief Complaint  Patient presents with  . Breast Problem    breast cancer    HPI Lindsey [name] is a 75 y.o. female.  who presents for a breast evaluation post breast biopsy showing left breast cancer. She has a history of right breast cancer in 1983. The most recent mammogram was done on 11-04-15.  Patient does perform regular self breast checks and gets regular mammograms done.  She states she could not feel anything different in the breast prior to the mammogram. She did have bloody discharge one time in February. She is here today with her husband-Carl, daughter-Cindy and daughter in law-Anita. I have reviewed the history of present illness with the patient.  HPI  Past Medical History  Diagnosis Date  . Depression   . Diabetes mellitus without complication ([place])   . Hyperlipidemia   . Osteoporosis   . Breast cancer ([place]) 1983    right breast cancer  . Hypertension   . Thyroid disease   . Breast cancer in female [facility]) 11-04-15    INVASIVE MAMMARY CARCINOMA     Past Surgical History  Procedure Laterality Date  . Mastectomy Right 1983    Dr Myrle Sheng  . Breast biopsy Left 11-04-15  . Cataract extraction  1990  . Breast biopsy Left 10-10-07    Family History  Problem Relation Age of Onset  . Kidney disease Mother   . Heart disease Father   . Stroke Sister   . Cancer Sister     breast  . Breast cancer Sister   . Stroke Brother   . Diabetes Brother   . Hypertension Brother   . Cancer Daughter 44    DCIS/lumpectomy/genetic negative    Social History Social History  Substance Use Topics  . Smoking status: Never Smoker   . Smokeless tobacco: Never Used  . Alcohol Use: No    No Known Allergies  Current Outpatient Prescriptions  Medication Sig Dispense Refill  . alendronate (FOSAMAX) 70 MG tablet Take 1 tablet (70 mg total) by mouth once a week. Take with a full glass of water on an empty  stomach. 52 tablet 0  . aspirin 81 MG tablet Take by mouth.    [name] atorvastatin (LIPITOR) 40 MG tablet Take 1 tablet (40 mg total) by mouth daily. 90 tablet 1  . cholecalciferol (VITAMIN D) 1000 UNITS tablet Take by mouth.    [name] ketoconazole (NIZORAL) 2 % cream APPLY TO THE SKIN TWICE A DAY TO INFRAMAMMARY AND GROIN AS DIRECTED  0  . lisinopril (PRINIVIL,ZESTRIL) 20 MG tablet Take 1 tablet (20 mg total) by mouth daily. 90 tablet 1  . metFORMIN (GLUCOPHAGE) 500 MG tablet Take 1 tablet (500 mg total) by mouth 2 (two) times daily with a meal. (Patient taking differently: Take 1,000 mg by mouth 2 (two) times daily with a meal. ) 360 tablet 1  . naproxen sodium (ALEVE) 220 MG tablet Take by mouth.    . sertraline (ZOLOFT) 100 MG tablet Take by mouth.    . SYNTHROID 112 MCG tablet Take 1 tablet (112 mcg total) by mouth daily. 90 tablet 2   No current facility-administered medications for this visit.    Review of Systems Review of Systems  Blood pressure 178/88, pulse 90, resp. rate 16, height 5\' 1"  (1.549 m), weight 190 lb (86.183 kg).  Physical Exam Physical Exam  Constitutional: She is  oriented to person, place, and time. She appears well-developed and well-nourished.  HENT:  Mouth/Throat: Oropharynx is clear and moist.  Eyes: Conjunctivae are normal. No scleral icterus.  Neck: Neck supple.  Cardiovascular: Normal rate, regular rhythm and normal heart sounds.   Pulmonary/Chest: Effort normal and breath sounds normal. Left breast exhibits skin change. Left breast exhibits no inverted nipple, no mass, no nipple discharge and no tenderness.  Right mastectomy with keloid like scars. Left breast minimal irregularity in the upper outer quadrant with minimal ecchymosis from recent biopsy.  Abdominal: Soft. Normal appearance. There is no hepatomegaly. There is no tenderness.  Lymphadenopathy:    She has no cervical adenopathy.    She has no axillary adenopathy.  Neurological: She is alert and  oriented to person, place, and time.  Skin: Skin is warm and dry.  Psychiatric: Her behavior is normal.    Data Reviewed Mammogram,ultrasound and progress notes. Suspicious mass at 1 o'clock left breast. Biopsy-invasive mammary CA, ER/PR pos, her 2 neg  Assessment    History of right breast cancer-mastectomy in 1983. New findings invasive mammary carcinoma left breast. No charge [place] was done to see if the mass is easy to locate- the mass biopsies at 1 ocl is well seen as also the clip. In view of the episode of bloody nipple discharge, the areolar area was also looked at There is a an intensely shadowing mass 3-4 ocl jus outside the areola.     Plan   Discussed findings in full with pt and her family present today. If pt decided on mastectomy, there is no need for biopsy of the new area lateral to nipple. She is uncertain at present Recommend left breast core biopsy. Discussed in detail with patient and family options of lumpectomy vs mastectomy, she will decide and call back with her decision.     PCP:  Rochel Brome A This information has been scribed by Karie Fetch RN, BSN,BC.   SANKAR,SEEPLAPUTHUR G 11/20/2015, 11:43 AM

## 2015-11-20 NOTE — Patient Instructions (Addendum)
Left breast biopsy in the office. Decide and call back with decision of lumpectomy vs mastectomy

## 2015-11-20 NOTE — Progress Notes (Signed)
  Oncology Nurse Navigator Documentation  Navigator Location: [facility] (11/20/15 0800) Navigator Encounter Type: Telephone (11/20/15 0800)           Patient Visit Type: Other (11/20/15 0800)   Barriers/Navigation Needs: Education (11/20/15 0800)                Acuity: Level 2 (11/20/15 0800)         Time Spent with Patient: 15 (11/20/15 0800)   Patient called today to see if she could eat prior to her surgery at 11:00.  Informed her she had a surgical consult this morning and would not be having surgery.  Informed her she would be discussing surgical options.  Informed her to go ahead and have breakfast this morning.  She is to call if she has any questions or needs.

## 2015-11-24 ENCOUNTER — Telehealth: Payer: Self-pay | Admitting: General Surgery

## 2015-11-24 ENCOUNTER — Ambulatory Visit: Payer: Medicare PPO | Admitting: General Surgery

## 2015-11-24 ENCOUNTER — Telehealth: Payer: Self-pay | Admitting: *Deleted

## 2015-11-24 ENCOUNTER — Other Ambulatory Visit: Payer: Self-pay | Admitting: General Surgery

## 2015-11-24 DIAGNOSIS — C50412 Malignant neoplasm of upper-outer quadrant of left female breast: Secondary | ICD-10-CM

## 2015-11-24 NOTE — Addendum Note (Signed)
Addended by: Christene Lye on: 11/24/2015 01:08 PM   Modules accepted: Orders

## 2015-11-24 NOTE — Telephone Encounter (Signed)
Pt's daughter Rodena Piety had called earlier stating Ms Fread had decided to have a mastectomy. Accordingly her appt today here was cancelled. As discussed before mastectomy and SN biopsy will be scheduled.  This procedure has been discussed with pt at her visit last week.

## 2015-11-24 NOTE — Telephone Encounter (Signed)
Per daughter-in-law, Rodena Piety, patient wants to cancel today's appointment for left breast biopsy and proceed with left breast mastectomy.   Patient's surgery to be scheduled for 12-12-15 at [facility]. It is okay for patient to continue 81 mg aspirin once daily.

## 2015-11-27 ENCOUNTER — Inpatient Hospital Stay: Payer: Medicare PPO | Admitting: Hematology and Oncology

## 2015-12-04 ENCOUNTER — Encounter
Admission: RE | Admit: 2015-12-04 | Discharge: 2015-12-04 | Disposition: A | Discharge status: Home / Self Care | Payer: Medicare PPO | Source: Ambulatory Visit | Attending: General Surgery | Admitting: General Surgery

## 2015-12-04 DIAGNOSIS — Z01810 Encounter for preprocedural cardiovascular examination: Secondary | ICD-10-CM | POA: Insufficient documentation

## 2015-12-04 DIAGNOSIS — Z01812 Encounter for preprocedural laboratory examination: Secondary | ICD-10-CM | POA: Insufficient documentation

## 2015-12-04 DIAGNOSIS — I10 Essential (primary) hypertension: Secondary | ICD-10-CM

## 2015-12-04 HISTORY — DX: Unspecified osteoarthritis, unspecified site: M19.90

## 2015-12-04 HISTORY — DX: Hypothyroidism, unspecified: E03.9

## 2015-12-04 LAB — COMPREHENSIVE METABOLIC PANEL
ALK PHOS: 47 U/L (ref 38–126)
ALT: 18 U/L (ref 14–54)
ANION GAP: 4 — AB (ref 5–15)
AST: 20 U/L (ref 15–41)
Albumin: 4.5 g/dL (ref 3.5–5.0)
BUN: 19 mg/dL (ref 6–20)
CALCIUM: 9.1 mg/dL (ref 8.9–10.3)
CHLORIDE: 105 mmol/L (ref 101–111)
CO2: 29 mmol/L (ref 22–32)
CREATININE: 0.63 mg/dL (ref 0.44–1.00)
GFR calc non Af Amer: >60 mL/min (ref >60)
GLUCOSE: 152 mg/dL — AB (ref 65–99)
Potassium: 3.5 mmol/L (ref 3.5–5.1)
Sodium: 138 mmol/L (ref 135–145)
Total Bilirubin: 1 mg/dL (ref 0.3–1.2)
Total Protein: 7.3 g/dL (ref 6.5–8.1)

## 2015-12-04 LAB — CBC WITH DIFFERENTIAL/PLATELET
Basophils Absolute: 0 10*3/uL (ref 0–0.1)
Basophils Relative: 1 %
Eosinophils Absolute: 0.2 10*3/uL (ref 0–0.7)
Eosinophils Relative: 4 %
HEMATOCRIT: 39.1 % (ref 35.0–47.0)
HEMOGLOBIN: 13.3 g/dL (ref 12.0–16.0)
LYMPHS ABS: 1.4 10*3/uL (ref 1.0–3.6)
LYMPHS PCT: 28 %
MCH: 29 pg (ref 26.0–34.0)
MCHC: 34.1 g/dL (ref 32.0–36.0)
MCV: 85.2 fL (ref 80.0–100.0)
MONOS PCT: 7 %
Monocytes Absolute: 0.3 10*3/uL (ref 0.2–0.9)
NEUTROS ABS: 3 10*3/uL (ref 1.4–6.5)
NEUTROS PCT: 60 %
Platelets: 153 10*3/uL (ref 150–440)
RBC: 4.59 MIL/uL (ref 3.80–5.20)
RDW: 14.7 % — ABNORMAL HIGH (ref 11.5–14.5)
WBC: 5 10*3/uL (ref 3.6–11.0)

## 2015-12-04 NOTE — Patient Instructions (Addendum)
  Your procedure is scheduled on: 12/12/15 Report to radiology at 7:00   Remember: Instructions that are not followed completely Taber result in serious medical risk, up to and including death, or upon the discretion of your surgeon and anesthesiologist your surgery Ange need to be rescheduled.    __x__ 1. Do not eat food or drink liquids after midnight. No gum chewing or hard candies.     __x__ 2. No Alcohol for 24 hours before or after surgery.   ____ 3. Bring all medications with you on the day of surgery if instructed.    __x__ 4. Notify your doctor if there is any change in your medical condition     (cold, fever, infections).     Do not wear jewelry, make-up, hairpins, clips or nail polish.  Do not wear lotions, powders, or perfumes. You [place] wear deodorant.  Do not shave 48 hours prior to surgery. Men [place] shave face and neck.  Do not bring valuables to the hospital.    [facility] is not responsible for any belongings or valuables.               Contacts, dentures or bridgework Dandy not be worn into surgery.  Leave your suitcase in the car. After surgery it [place] be brought to your room.  For patients admitted to the hospital, discharge time is determined by your                treatment team.   Patients discharged the day of surgery will not be allowed to drive home.   Please read over the following fact sheets that you were given:   Surgical Site Infection Prevention   ____ Take these medicines the morning of surgery with A SIP OF WATER:    1. synthroid  2. lisinopril  3. sertraline  4.Cart take tylenol if hurting  5.  6.  ____ Fleet Enema (as directed)   __x__ Use CHG Soap as directed  ____ Use inhalers on the day of surgery  __x__ Stop metformin 2 days prior to surgery Last dose on 4/18   ____ Take 1/2 of usual insulin dose the night before surgery and none on the morning of surgery.   __x_ Stop aspirin on 4/14  __x__ Stop Anti-inflammatories on 4/14  Sulewski take  tylenol for pain   ____ Stop supplements until after surgery.    ____ Bring C-Pap to the hospital.

## 2015-12-04 NOTE — Pre-Procedure Instructions (Signed)
Dr. Rosey Bath notified of right bundle branch on EKG taken in PAT.  He Ok'd to proceed without clearance.

## 2015-12-04 NOTE — Pre-Procedure Instructions (Signed)
Patient practices right arm precautions due to a right mastectomy in the distant past and is having a left mastectomy.  Dr. Angie Fava office will call us to let us know if we need to use a foot IV or can we use the right arm. Patient is diabetic.

## 2015-12-05 NOTE — Pre-Procedure Instructions (Signed)
Dr. Jamal Collin said to use the right arm for the IV stick in upcoming left mastectomy surgery.

## 2015-12-12 ENCOUNTER — Ambulatory Visit
Admission: RE | Admit: 2015-12-12 | Discharge: 2015-12-12 | Disposition: A | Discharge status: Home / Self Care | Payer: Medicare PPO | Source: Ambulatory Visit | Attending: General Surgery | Admitting: General Surgery

## 2015-12-12 ENCOUNTER — Encounter: Payer: Self-pay | Admitting: *Deleted

## 2015-12-12 ENCOUNTER — Ambulatory Visit: Payer: Medicare PPO | Admitting: Anesthesiology

## 2015-12-12 ENCOUNTER — Encounter
Admission: RE | Disposition: A | Discharge status: Home / Self Care | Payer: Self-pay | Source: Ambulatory Visit | Attending: General Surgery

## 2015-12-12 DIAGNOSIS — E119 Type 2 diabetes mellitus without complications: Secondary | ICD-10-CM | POA: Diagnosis not present

## 2015-12-12 DIAGNOSIS — Z6835 Body mass index (BMI) 35.0-35.9, adult: Secondary | ICD-10-CM | POA: Insufficient documentation

## 2015-12-12 DIAGNOSIS — E669 Obesity, unspecified: Secondary | ICD-10-CM | POA: Insufficient documentation

## 2015-12-12 DIAGNOSIS — C50412 Malignant neoplasm of upper-outer quadrant of left female breast: Secondary | ICD-10-CM | POA: Diagnosis not present

## 2015-12-12 DIAGNOSIS — Z841 Family history of disorders of kidney and ureter: Secondary | ICD-10-CM | POA: Insufficient documentation

## 2015-12-12 DIAGNOSIS — Z9011 Acquired absence of right breast and nipple: Secondary | ICD-10-CM | POA: Diagnosis not present

## 2015-12-12 DIAGNOSIS — M810 Age-related osteoporosis without current pathological fracture: Secondary | ICD-10-CM | POA: Diagnosis not present

## 2015-12-12 DIAGNOSIS — Z7983 Long term (current) use of bisphosphonates: Secondary | ICD-10-CM | POA: Insufficient documentation

## 2015-12-12 DIAGNOSIS — Z833 Family history of diabetes mellitus: Secondary | ICD-10-CM | POA: Diagnosis not present

## 2015-12-12 DIAGNOSIS — Z803 Family history of malignant neoplasm of breast: Secondary | ICD-10-CM | POA: Diagnosis not present

## 2015-12-12 DIAGNOSIS — Z853 Personal history of malignant neoplasm of breast: Secondary | ICD-10-CM | POA: Insufficient documentation

## 2015-12-12 DIAGNOSIS — I10 Essential (primary) hypertension: Secondary | ICD-10-CM | POA: Diagnosis not present

## 2015-12-12 DIAGNOSIS — M1990 Unspecified osteoarthritis, unspecified site: Secondary | ICD-10-CM | POA: Diagnosis not present

## 2015-12-12 DIAGNOSIS — Z7982 Long term (current) use of aspirin: Secondary | ICD-10-CM | POA: Diagnosis not present

## 2015-12-12 DIAGNOSIS — C50912 Malignant neoplasm of unspecified site of left female breast: Secondary | ICD-10-CM | POA: Diagnosis present

## 2015-12-12 DIAGNOSIS — Z79899 Other long term (current) drug therapy: Secondary | ICD-10-CM | POA: Insufficient documentation

## 2015-12-12 DIAGNOSIS — E039 Hypothyroidism, unspecified: Secondary | ICD-10-CM | POA: Diagnosis not present

## 2015-12-12 DIAGNOSIS — D0512 Intraductal carcinoma in situ of left breast: Secondary | ICD-10-CM | POA: Diagnosis not present

## 2015-12-12 DIAGNOSIS — Z9842 Cataract extraction status, left eye: Secondary | ICD-10-CM | POA: Insufficient documentation

## 2015-12-12 DIAGNOSIS — Z8249 Family history of ischemic heart disease and other diseases of the circulatory system: Secondary | ICD-10-CM | POA: Insufficient documentation

## 2015-12-12 DIAGNOSIS — E785 Hyperlipidemia, unspecified: Secondary | ICD-10-CM | POA: Diagnosis not present

## 2015-12-12 DIAGNOSIS — F329 Major depressive disorder, single episode, unspecified: Secondary | ICD-10-CM | POA: Insufficient documentation

## 2015-12-12 DIAGNOSIS — Z170 Estrogen receptor positive status [ER+]: Secondary | ICD-10-CM | POA: Diagnosis not present

## 2015-12-12 DIAGNOSIS — Z7984 Long term (current) use of oral hypoglycemic drugs: Secondary | ICD-10-CM | POA: Insufficient documentation

## 2015-12-12 DIAGNOSIS — Z823 Family history of stroke: Secondary | ICD-10-CM | POA: Diagnosis not present

## 2015-12-12 HISTORY — PX: MASTECTOMY W/ SENTINEL NODE BIOPSY: SHX2001

## 2015-12-12 LAB — GLUCOSE, CAPILLARY: Glucose-Capillary: 161 mg/dL — ABNORMAL HIGH (ref 65–99)

## 2015-12-12 SURGERY — MASTECTOMY WITH SENTINEL LYMPH NODE BIOPSY
Anesthesia: General | Laterality: Left | Wound class: Clean Contaminated

## 2015-12-12 MED ORDER — FENTANYL CITRATE (PF) 100 MCG/2ML IJ SOLN
INTRAMUSCULAR | Status: DC | PRN
  Administered 2015-12-12: 50 ug via INTRAVENOUS
  Administered 2015-12-12: 25 ug via INTRAVENOUS
  Administered 2015-12-12: 75 ug via INTRAVENOUS
  Administered 2015-12-12: 50 ug via INTRAVENOUS

## 2015-12-12 MED ORDER — ONDANSETRON HCL 4 MG/2ML IJ SOLN
INTRAMUSCULAR | Status: AC
  Filled 2015-12-12: qty 2

## 2015-12-12 MED ORDER — CEFAZOLIN SODIUM-DEXTROSE 2-4 GM/100ML-% IV SOLN
INTRAVENOUS | Status: AC
  Administered 2015-12-12: 2 g via INTRAVENOUS
  Filled 2015-12-12: qty 100

## 2015-12-12 MED ORDER — HYDROMORPHONE HCL 1 MG/ML IJ SOLN
0.2500 mg | INTRAMUSCULAR | Status: DC | PRN
  Administered 2015-12-12 (×2): 0.5 mg via INTRAVENOUS

## 2015-12-12 MED ORDER — ACETAMINOPHEN 10 MG/ML IV SOLN
INTRAVENOUS | Status: DC | PRN
  Administered 2015-12-12: 1000 mg via INTRAVENOUS

## 2015-12-12 MED ORDER — LIDOCAINE HCL (CARDIAC) 20 MG/ML IV SOLN
INTRAVENOUS | Status: DC | PRN
  Administered 2015-12-12: 60 mg via INTRAVENOUS

## 2015-12-12 MED ORDER — ONDANSETRON HCL 4 MG/2ML IJ SOLN
INTRAMUSCULAR | Status: DC | PRN
  Administered 2015-12-12: 4 mg via INTRAVENOUS

## 2015-12-12 MED ORDER — CEFAZOLIN SODIUM-DEXTROSE 2-4 GM/100ML-% IV SOLN
2.0000 g | INTRAVENOUS | Status: AC
  Administered 2015-12-12: 2 g via INTRAVENOUS

## 2015-12-12 MED ORDER — KETAMINE HCL 50 MG/ML IJ SOLN
INTRAMUSCULAR | Status: DC | PRN
  Administered 2015-12-12: 25 mg via INTRAMUSCULAR
  Administered 2015-12-12: 20 mg via INTRAMUSCULAR

## 2015-12-12 MED ORDER — ONDANSETRON HCL 4 MG/2ML IJ SOLN
4.0000 mg | Freq: Once | INTRAMUSCULAR | Status: AC | PRN
  Administered 2015-12-12: 4 mg via INTRAVENOUS

## 2015-12-12 MED ORDER — SODIUM CHLORIDE 0.9 % IV SOLN
INTRAVENOUS | Status: DC
  Administered 2015-12-12: 50 mL/h via INTRAVENOUS

## 2015-12-12 MED ORDER — FAMOTIDINE 20 MG PO TABS
20.0000 mg | ORAL_TABLET | Freq: Once | ORAL | Status: AC
  Administered 2015-12-12: 20 mg via ORAL

## 2015-12-12 MED ORDER — DEXAMETHASONE SODIUM PHOSPHATE 10 MG/ML IJ SOLN
INTRAMUSCULAR | Status: DC | PRN
  Administered 2015-12-12: 10 mg via INTRAVENOUS

## 2015-12-12 MED ORDER — CHLORHEXIDINE GLUCONATE 4 % EX LIQD: 1.0000 "application " | Freq: Once | CUTANEOUS | Status: DC

## 2015-12-12 MED ORDER — OXYCODONE-ACETAMINOPHEN 5-325 MG PO TABS: 1.0000 | ORAL_TABLET | ORAL | Status: DC | PRN

## 2015-12-12 MED ORDER — SODIUM CHLORIDE 0.9 % IJ SOLN
INTRAMUSCULAR | Status: AC
  Filled 2015-12-12: qty 10

## 2015-12-12 MED ORDER — TECHNETIUM TC 99M SULFUR COLLOID
1.0000 | Freq: Once | INTRAVENOUS | Status: AC | PRN
  Administered 2015-12-12: 0.923 via INTRAVENOUS

## 2015-12-12 MED ORDER — METHYLENE BLUE 0.5 % INJ SOLN
INTRAVENOUS | Status: DC | PRN
  Administered 2015-12-12: 6 mL via SUBMUCOSAL

## 2015-12-12 MED ORDER — METOCLOPRAMIDE HCL 5 MG/ML IJ SOLN
INTRAMUSCULAR | Status: AC
  Administered 2015-12-12: 10 mg
  Filled 2015-12-12: qty 2

## 2015-12-12 MED ORDER — METHYLENE BLUE 0.5 % INJ SOLN
INTRAVENOUS | Status: AC
  Filled 2015-12-12: qty 10

## 2015-12-12 MED ORDER — FAMOTIDINE 20 MG PO TABS
ORAL_TABLET | ORAL | Status: AC
  Administered 2015-12-12: 20 mg via ORAL
  Filled 2015-12-12: qty 1

## 2015-12-12 MED ORDER — ACETAMINOPHEN 10 MG/ML IV SOLN
INTRAVENOUS | Status: AC
  Filled 2015-12-12: qty 100

## 2015-12-12 MED ORDER — PROPOFOL 10 MG/ML IV BOLUS
INTRAVENOUS | Status: DC | PRN
  Administered 2015-12-12: 100 mg via INTRAVENOUS

## 2015-12-12 MED ORDER — EPHEDRINE SULFATE 50 MG/ML IJ SOLN
INTRAMUSCULAR | Status: DC | PRN
  Administered 2015-12-12: 5 mg via INTRAVENOUS

## 2015-12-12 MED ORDER — HYDROMORPHONE HCL 1 MG/ML IJ SOLN
INTRAMUSCULAR | Status: AC
  Administered 2015-12-12: 0.5 mg via INTRAVENOUS
  Filled 2015-12-12: qty 1

## 2015-12-12 SURGICAL SUPPLY — 58 items
APPLIER CLIP 11 MED OPEN (CLIP)
APPLIER CLIP 13 LRG OPEN (CLIP)
BLADE CLIPPER SURG (BLADE) ×3 IMPLANT
BLADE SURG 10 STRL SS SAFETY (BLADE) ×3 IMPLANT
BULB RESERV EVAC DRAIN JP 100C (MISCELLANEOUS) ×6 IMPLANT
CANISTER SUCT 1200ML W/VALVE (MISCELLANEOUS) ×3 IMPLANT
CHLORAPREP W/TINT 26ML (MISCELLANEOUS) ×3 IMPLANT
CLIP APPLIE 11 MED OPEN (CLIP) IMPLANT
CLIP APPLIE 13 LRG OPEN (CLIP) IMPLANT
CLOSURE WOUND 1/2 X4 (GAUZE/BANDAGES/DRESSINGS) ×2
DEVICE DISSECT PLASMABLAD 3.0S (MISCELLANEOUS) ×1 IMPLANT
DRAIN CHANNEL JP 15F RND 16 (MISCELLANEOUS) ×6 IMPLANT
DRAPE LAPAROTOMY TRNSV 106X77 (MISCELLANEOUS) ×3 IMPLANT
DRSG TELFA 3X8 NADH (GAUZE/BANDAGES/DRESSINGS) ×3 IMPLANT
ELECT CAUTERY BLADE 6.4 (BLADE) ×3 IMPLANT
ELECT REM PT RETURN 9FT ADLT (ELECTROSURGICAL) ×3
ELECTRODE REM PT RTRN 9FT ADLT (ELECTROSURGICAL) ×1 IMPLANT
GAUZE FLUFF 18X24 1PLY STRL (GAUZE/BANDAGES/DRESSINGS) ×3 IMPLANT
GAUZE SPONGE 4X4 12PLY STRL (GAUZE/BANDAGES/DRESSINGS) ×3 IMPLANT
GLOVE BIO SURGEON STRL SZ7 (GLOVE) ×18 IMPLANT
GOWN STRL REUS W/ TWL LRG LVL3 (GOWN DISPOSABLE) ×4 IMPLANT
GOWN STRL REUS W/TWL LRG LVL3 (GOWN DISPOSABLE) ×8
HARMONIC SCALPEL FOCUS (MISCELLANEOUS) IMPLANT
KIT RM TURNOVER STRD PROC AR (KITS) ×3 IMPLANT
LABEL OR SOLS (LABEL) ×3 IMPLANT
LIQUID BAND (GAUZE/BANDAGES/DRESSINGS) ×3 IMPLANT
NDL SAFETY 18GX1.5 (NEEDLE) ×3 IMPLANT
NEEDLE FILTER BLUNT 18X 1/2SAF (NEEDLE) ×2
NEEDLE FILTER BLUNT 18X1 1/2 (NEEDLE) ×1 IMPLANT
NEEDLE HYPO 25X1 1.5 SAFETY (NEEDLE) ×3 IMPLANT
NS IRRIG 1000ML POUR BTL (IV SOLUTION) ×3 IMPLANT
PACK BASIN MINOR ARMC (MISCELLANEOUS) ×3 IMPLANT
PAD ABD DERMACEA PRESS 5X9 (GAUZE/BANDAGES/DRESSINGS) ×3 IMPLANT
PIN SAFETY STRL (MISCELLANEOUS) IMPLANT
PLASMABLADE 3.0S (MISCELLANEOUS) ×3
SPONGE LAP 18X18 5 PK (GAUZE/BANDAGES/DRESSINGS) ×6 IMPLANT
STRIP CLOSURE SKIN 1/2X4 (GAUZE/BANDAGES/DRESSINGS) ×4 IMPLANT
SURGI-BRA LG (MISCELLANEOUS) ×3 IMPLANT
SUT ETHILON 2 0 FS 18 (SUTURE) ×3 IMPLANT
SUT ETHILON 2 0 FSLX (SUTURE) ×3 IMPLANT
SUT ETHILON 3-0 FS-10 30 BLK (SUTURE) ×6
SUT MNCRL AB 3-0 PS2 27 (SUTURE) ×6 IMPLANT
SUT SILK 2 0 (SUTURE) ×2
SUT SILK 2-0 18XBRD TIE 12 (SUTURE) ×1 IMPLANT
SUT SILK 3 0 (SUTURE) ×2
SUT SILK 3-0 18XBRD TIE 12 (SUTURE) ×1 IMPLANT
SUT SILK 4 0 (SUTURE) ×2
SUT SILK 4-0 18XBRD TIE 12 (SUTURE) ×1 IMPLANT
SUT VIC AB 2-0 CT1 27 (SUTURE) ×4
SUT VIC AB 2-0 CT1 TAPERPNT 27 (SUTURE) ×2 IMPLANT
SUT VIC AB 3-0 SH 27 (SUTURE) ×2
SUT VIC AB 3-0 SH 27X BRD (SUTURE) ×1 IMPLANT
SUT VICRYL+ 3-0 144IN (SUTURE) ×3 IMPLANT
SUTURE EHLN 3-0 FS-10 30 BLK (SUTURE) ×2 IMPLANT
SWABSTK COMLB BENZOIN TINCTURE (MISCELLANEOUS) ×3 IMPLANT
SYRINGE 10CC LL (SYRINGE) ×3 IMPLANT
TUBING CONNECTING 10 (TUBING) ×2 IMPLANT
TUBING CONNECTING 10' (TUBING) ×1

## 2015-12-12 NOTE — Anesthesia Preprocedure Evaluation (Signed)
Anesthesia Evaluation  Patient identified by MRN, date of birth, ID band Patient awake    Reviewed: Allergy & Precautions, NPO status , Patient's Chart, lab work & pertinent test results  Airway Mallampati: II  TM Distance: >3 FB Neck ROM: Limited    Dental  (+) Upper Dentures   Pulmonary    Pulmonary exam normal        Cardiovascular Exercise Tolerance: Good hypertension, Pt. on medications Normal cardiovascular exam     Neuro/Psych Depression    GI/Hepatic   Endo/Other  diabetes, Type 2Hypothyroidism BG 161. Treated hypothyroidism.  Renal/GU      Musculoskeletal  (+) Arthritis , Osteoarthritis,    Abdominal (+) + obese,  Abdomen: soft.    Peds  Hematology   Anesthesia Other Findings   Reproductive/Obstetrics                             Anesthesia Physical Anesthesia Plan  ASA: III  Anesthesia Plan: General   Post-op Pain Management:    Induction: Intravenous  Airway Management Planned: LMA  Additional Equipment:   Intra-op Plan:   Post-operative Plan: Extubation in OR  Informed Consent: I have reviewed the patients History and Physical, chart, labs and discussed the procedure including the risks, benefits and alternatives for the proposed anesthesia with the patient or authorized representative who has indicated his/her understanding and acceptance.     Plan Discussed with: CRNA  Anesthesia Plan Comments:         Anesthesia Quick Evaluation

## 2015-12-12 NOTE — Anesthesia Postprocedure Evaluation (Signed)
Anesthesia Post Note  Patient: Lindsey [name]  Procedure(s) Performed: Procedure(s) (LRB): MASTECTOMY WITH SENTINEL LYMPH NODE BIOPSY (Left)  Patient location during evaluation: PACU Anesthesia Type: General Level of consciousness: awake Pain management: pain level controlled Vital Signs Assessment: post-procedure vital signs reviewed and stable Respiratory status: spontaneous breathing Cardiovascular status: blood pressure returned to baseline Postop Assessment: no headache Anesthetic complications: no    Last Vitals:  Filed Vitals:   12/12/15 0827  BP: 187/95  Pulse: 78  Temp: 36.5 C  Resp: 6    Last Pain: There were no vitals filed for this visit.               Jhana Giarratano M

## 2015-12-12 NOTE — Interval H&P Note (Signed)
History and Physical Interval Note:  12/12/2015 8:46 AM  Lindsey [name]  has presented today for surgery, with the diagnosis of LEFT BREAST CANCER  The various methods of treatment have been discussed with the patient and family. After consideration of risks, benefits and other options for treatment, the patient has consented to  Procedure(s): MASTECTOMY WITH SENTINEL LYMPH NODE BIOPSY (Left) as a surgical intervention .  The patient's history has been reviewed, patient examined, no change in status, stable for surgery.  I have reviewed the patient's chart and labs.  Questions were answered to the patient's satisfaction.     [name] G

## 2015-12-12 NOTE — H&P (View-Only) (Signed)
Patient ID: Lindsey [name], female   DOB: 10/15/1940, 75 y.o.   MRN: [id number]  Chief Complaint  Patient presents with  . Breast Problem    breast cancer    HPI Lindsey [name] is a 75 y.o. female.  who presents for a breast evaluation post breast biopsy showing left breast cancer. She has a history of right breast cancer in 1983. The most recent mammogram was done on 11-04-15.  Patient does perform regular self breast checks and gets regular mammograms done.  She states she could not feel anything different in the breast prior to the mammogram. She did have bloody discharge one time in February. She is here today with her husband-Lindsey [name], daughter-Lindsey [name] and daughter in law-Lindsey [name]. I have reviewed the history of present illness with the patient.  HPI  Past Medical History  Diagnosis Date  . Depression   . Diabetes mellitus without complication ([place])   . Hyperlipidemia   . Osteoporosis   . Breast cancer ([place]) 1983    right breast cancer  . Hypertension   . Thyroid disease   . Breast cancer in female [facility]) 11-04-15    INVASIVE MAMMARY CARCINOMA     Past Surgical History  Procedure Laterality Date  . Mastectomy Right 1983    Dr Myrle Sheng  . Breast biopsy Left 11-04-15  . Cataract extraction  1990  . Breast biopsy Left 10-10-07    Family History  Problem Relation Age of Onset  . Kidney disease Mother   . Heart disease Father   . Stroke Sister   . Cancer Sister     breast  . Breast cancer Sister   . Stroke Brother   . Diabetes Brother   . Hypertension Brother   . Cancer Daughter 74    DCIS/lumpectomy/genetic negative    Social History Social History  Substance Use Topics  . Smoking status: Never Smoker   . Smokeless tobacco: Never Used  . Alcohol Use: No    No Known Allergies  Current Outpatient Prescriptions  Medication Sig Dispense Refill  . alendronate (FOSAMAX) 70 MG tablet Take 1 tablet (70 mg total) by mouth once a week. Take with a full glass of water on an empty  stomach. 52 tablet 0  . aspirin 81 MG tablet Take by mouth.    [name] atorvastatin (LIPITOR) 40 MG tablet Take 1 tablet (40 mg total) by mouth daily. 90 tablet 1  . cholecalciferol (VITAMIN D) 1000 UNITS tablet Take by mouth.    [name] ketoconazole (NIZORAL) 2 % cream APPLY TO THE SKIN TWICE A DAY TO INFRAMAMMARY AND GROIN AS DIRECTED  0  . lisinopril (PRINIVIL,ZESTRIL) 20 MG tablet Take 1 tablet (20 mg total) by mouth daily. 90 tablet 1  . metFORMIN (GLUCOPHAGE) 500 MG tablet Take 1 tablet (500 mg total) by mouth 2 (two) times daily with a meal. (Patient taking differently: Take 1,000 mg by mouth 2 (two) times daily with a meal. ) 360 tablet 1  . naproxen sodium (ALEVE) 220 MG tablet Take by mouth.    . sertraline (ZOLOFT) 100 MG tablet Take by mouth.    . SYNTHROID 112 MCG tablet Take 1 tablet (112 mcg total) by mouth daily. 90 tablet 2   No current facility-administered medications for this visit.    Review of Systems Review of Systems  Blood pressure 178/88, pulse 90, resp. rate 16, height 5\' 1"  (1.549 m), weight 190 lb (86.183 kg).  Physical Exam Physical Exam  Constitutional: She is  oriented to person, place, and time. She appears well-developed and well-nourished.  HENT:  Mouth/Throat: Oropharynx is clear and moist.  Eyes: Conjunctivae are normal. No scleral icterus.  Neck: Neck supple.  Cardiovascular: Normal rate, regular rhythm and normal heart sounds.   Pulmonary/Chest: Effort normal and breath sounds normal. Left breast exhibits skin change. Left breast exhibits no inverted nipple, no mass, no nipple discharge and no tenderness.  Right mastectomy with keloid like scars. Left breast minimal irregularity in the upper outer quadrant with minimal ecchymosis from recent biopsy.  Abdominal: Soft. Normal appearance. There is no hepatomegaly. There is no tenderness.  Lymphadenopathy:    She has no cervical adenopathy.    She has no axillary adenopathy.  Neurological: She is alert and  oriented to person, place, and time.  Skin: Skin is warm and dry.  Psychiatric: Her behavior is normal.    Data Reviewed Mammogram,ultrasound and progress notes. Suspicious mass at 1 o'clock left breast. Biopsy-invasive mammary CA, ER/PR pos, her 2 neg  Assessment    History of right breast cancer-mastectomy in 1983. New findings invasive mammary carcinoma left breast. No charge [place] was done to see if the mass is easy to locate- the mass biopsies at 1 ocl is well seen as also the clip. In view of the episode of bloody nipple discharge, the areolar area was also looked at There is a an intensely shadowing mass 3-4 ocl jus outside the areola.     Plan   Discussed findings in full with pt and her family present today. If pt decided on mastectomy, there is no need for biopsy of the new area lateral to nipple. She is uncertain at present Recommend left breast core biopsy. Discussed in detail with patient and family options of lumpectomy vs mastectomy, she will decide and call back with her decision.     PCP:  Rochel Brome A This information has been scribed by Karie Fetch RN, BSN,BC.   SANKAR,SEEPLAPUTHUR G 11/20/2015, 11:43 AM

## 2015-12-12 NOTE — Anesthesia Procedure Notes (Signed)
Procedure Name: LMA Insertion Date/Time: 12/12/2015 9:53 AM Performed by: Jenetta Downer Pre-anesthesia Checklist: Patient identified, Emergency Drugs available, Suction available and Patient being monitored Patient Re-evaluated:Patient Re-evaluated prior to inductionOxygen Delivery Method: Circle system utilized Preoxygenation: Pre-oxygenation with 100% oxygen Intubation Type: IV induction Ventilation: Mask ventilation without difficulty LMA: LMA inserted LMA Size: 4.0 Number of attempts: 1 Placement Confirmation: positive ETCO2 and breath sounds checked- equal and bilateral

## 2015-12-12 NOTE — Transfer of Care (Signed)
Immediate Anesthesia Transfer of Care Note  Patient: Lindsey [name]  Procedure(s) Performed: Procedure(s): MASTECTOMY WITH SENTINEL LYMPH NODE BIOPSY (Left)  Patient Location: PACU  Anesthesia Type:General  Level of Consciousness: sedated  Airway & Oxygen Therapy: Patient Spontanous Breathing and Patient connected to face mask oxygen  Post-op Assessment: Report given to RN and Post -op Vital signs reviewed and stable  Post vital signs: Reviewed and stable  Last Vitals:  Filed Vitals:   12/12/15 0827 12/12/15 1209  BP: 187/95 151/98  Pulse: 78 82  Temp: 36.5 C 36.4 C  Resp: 6 17    Complications: No apparent anesthesia complications

## 2015-12-12 NOTE — Discharge Instructions (Signed)
AMBULATORY SURGERY  °DISCHARGE INSTRUCTIONS ° ° °1) The drugs that you were given will stay in your system until tomorrow so for the next 24 hours you should not: ° °A) Drive an automobile °B) Make any legal decisions °C) Drink any alcoholic beverage ° ° °2) You [place] resume regular meals tomorrow.  Today it is better to start with liquids and gradually work up to solid foods. ° °You [place] eat anything you prefer, but it is better to start with liquids, then soup and crackers, and gradually work up to solid foods. ° ° °3) Please notify your doctor immediately if you have any unusual bleeding, trouble breathing, redness and pain at the surgery site, drainage, fever, or pain not relieved by medication. ° ° ° °4) Additional Instructions: ° ° ° ° ° ° ° °Please contact your physician with any problems or Same Day Surgery at [phone], Monday through Friday 6 am to 4 pm, or [place] [facility] number at [phone]. °

## 2015-12-12 NOTE — Op Note (Signed)
Preop diagnosis: Carcinoma left breast  Post op diagnosis: Same  Operation: Left total mastectomy with sentinel node biopsy  Surgeon: S.G.Kaiser Belluomini  Assistant:     Anesthesia: Gen.  Complications: None  EBL: 100 mL  Drains: 2 Jackson-Pratt    drains  Description: Preoperatively the patient underwent nuclear contrast injection for sentinel node identification. Patient was brought the operating room and put to sleep with a Gamma finder the activity in the axilla was noted be very limited and accordingly 5 mL of diluted methylene blue was injected in the subareolar region. The left breast maxilla was then prepped and draped as sterile field. Timeout was performed. Elliptical incision in the medial to lateral aspect was mapped out on the skin and the skin incision was made along the superior flap with the initial dissection limited to the lateral aspect of this. The skin and subcutaneous tissue was lifted up all the way to the anterior fold of the axilla and the sentinel node region was acquired with the use of from Gamma finder did not appear to be any easily apparent node however with further search there was one node about a centimeter centimeter and a half in size that was somewhat firm is seemed to have a little bit of activity. This was removed and sent off as sentinel node separately no other visible nodes or palpable nodes were noted and signal activity was essentially nill and nor was a any blue nodes seen. Total mastectomy was then performed and the skin incision was made superior and inferior flaps were then created superior flap was created to the infraclavicular space. Lower flap to the upper rectus fascia. The plasma blade was utilized for the entire dissection in coagulation in addition a suture ligature of 3-0 Vicryl and plain ligatures of 3-0 Vicryl used. The breast with the underlying pectoral fascia was then dissected off from the underlying pectoral muscle from the medial to lateral  aspect and the lateral attachments were then taken down. A small portion of the inferior most aspect of the axillary fat pad was removed along with the breast for additional lymph node assessment. The lateral end of the breast was tagged for identification and orientation for the pathologist. Removed breast was sent fresh to the pathology. Hemostasis was ensured the wound was then irrigated with saline enhanced all of this fluid was suctioned out to 9 mm Jackson-Pratt drain is then positioned and brought out through the inferior stab incisions one was placed along the medial aspect one laterally towards the axilla and the drain was fastened the skin with 3-0 nylon stitches the subcutaneous tissue was approximated with the several interrupted 2-0 Vicryl stitches and the skin then closed with 2 running sutures of 3-0 Monocryl. Liqui ban was applied a Telfa and fluffs and ABDs were then placed in a surgical bra utilized to hold this in place patient tolerated the procedure well with no immediate problems encountered she was subsequently returned recovery room stable condition.

## 2015-12-15 ENCOUNTER — Ambulatory Visit (INDEPENDENT_AMBULATORY_CARE_PROVIDER_SITE_OTHER): Payer: Medicare PPO | Admitting: General Surgery

## 2015-12-15 ENCOUNTER — Encounter: Payer: Self-pay | Admitting: General Surgery

## 2015-12-15 VITALS — BP 126/74 | HR 74 | Resp 12 | Ht 61.0 in | Wt 183.0 lb

## 2015-12-15 DIAGNOSIS — C50412 Malignant neoplasm of upper-outer quadrant of left female breast: Secondary | ICD-10-CM

## 2015-12-15 NOTE — Patient Instructions (Signed)
Patient to return at the beginning of next week.

## 2015-12-15 NOTE — Progress Notes (Signed)
This is a 75 year old female here today for her post op left mastectomy done on 12/12/15. Patient is doing well and has no complaints. Incision is clean and healing well and intact. Dressings were changed. Drainage is serosanguious and in moderate amounts. Patient to return early next week.    PCP:  Keith Rake  This information has been scribed by Gaspar Cola CMA.

## 2015-12-16 ENCOUNTER — Encounter: Payer: Self-pay | Admitting: General Surgery

## 2015-12-16 LAB — SURGICAL PATHOLOGY

## 2015-12-17 NOTE — Progress Notes (Signed)
  Oncology Nurse Navigator Documentation  Navigator Location: [facility] (12/17/15 1000)         Surgery Date: 12/12/15 (12/17/15 1000) Treatment Initiated Date: 12/12/15 (12/17/15 1000) Patient Visit Type: Follow-up (12/17/15 1000) Treatment Phase: Follow-up (surgery) (12/17/15 1000)     Interventions: Coordination of Care (12/17/15 1000)        Support Groups/Services:  (Lymphedema class) (12/17/15 1000)   Acuity: Level 1 (12/17/15 1000)         Time Spent with Patient: 30 (12/17/15 1000)  Phoned patient post mastectomy.  States she is doing well with minimal discomfort, but having difficulty sleeping.  Would like to take Aleve, states it sometimes helps her sleep.  Spoke to Colman Cater RN at Dr. Angie Fava office, and phoned patient back to let her know ok to take Aleve.  Also recommended Lymphedema class offered here at the [facility] to patient.  She has flier with number to register when she is ready.  Her next appointment with Dr. Jamal Collin is on [place] 1, 2017.

## 2015-12-22 ENCOUNTER — Ambulatory Visit (INDEPENDENT_AMBULATORY_CARE_PROVIDER_SITE_OTHER): Payer: Medicare PPO | Admitting: General Surgery

## 2015-12-22 ENCOUNTER — Encounter: Payer: Self-pay | Admitting: General Surgery

## 2015-12-22 VITALS — BP 128/74 | HR 90 | Resp 14 | Ht 64.0 in | Wt 188.0 lb

## 2015-12-22 DIAGNOSIS — C50412 Malignant neoplasm of upper-outer quadrant of left female breast: Secondary | ICD-10-CM

## 2015-12-22 NOTE — Progress Notes (Signed)
This is a 75 year old female here today for her post op left mastectomy done on 12/12/15. She reports moderate drainage, but did not bring her drainage chart. I have reviewed the history of present illness with the patient.  Left mastectomy incision is clean and intact. Drains also intact with serosanguinous drainage. Path- pT1c, pN0 Pt advised. Patient is doing well except for some mild post surgical pain.   RTC 1 week    PCP:  Keith Rake  This information has been scribed by Gaspar Cola CMA.

## 2015-12-22 NOTE — Patient Instructions (Addendum)
Return one week.  

## 2015-12-29 ENCOUNTER — Ambulatory Visit (INDEPENDENT_AMBULATORY_CARE_PROVIDER_SITE_OTHER): Payer: Medicare PPO | Admitting: Family Medicine

## 2015-12-29 ENCOUNTER — Encounter: Payer: Self-pay | Admitting: Family Medicine

## 2015-12-29 VITALS — BP 128/84 | HR 84 | Temp 98.2°F | Resp 16 | Ht 64.0 in | Wt 188.7 lb

## 2015-12-29 DIAGNOSIS — E785 Hyperlipidemia, unspecified: Secondary | ICD-10-CM

## 2015-12-29 DIAGNOSIS — I10 Essential (primary) hypertension: Secondary | ICD-10-CM | POA: Diagnosis not present

## 2015-12-29 DIAGNOSIS — E1121 Type 2 diabetes mellitus with diabetic nephropathy: Secondary | ICD-10-CM

## 2015-12-29 DIAGNOSIS — F331 Major depressive disorder, recurrent, moderate: Secondary | ICD-10-CM

## 2015-12-29 LAB — POCT GLYCOSYLATED HEMOGLOBIN (HGB A1C): Hemoglobin A1C: 7.2

## 2015-12-29 MED ORDER — SERTRALINE HCL 100 MG PO TABS: 100.0000 mg | ORAL_TABLET | Freq: Every day | ORAL | Status: DC

## 2015-12-29 NOTE — Progress Notes (Signed)
Name: Lindsey [name]   MRN: [id number]    DOB: Oct 08, 1940   Date:12/29/2015       Progress Note  Subjective  Chief Complaint  Chief Complaint  Patient presents with  . Follow-up    Diabetes She presents for her follow-up diabetic visit. She has type 2 diabetes mellitus. Her disease course has been stable. Pertinent negatives for hypoglycemia include no headaches. Pertinent negatives for diabetes include no chest pain, no fatigue, no foot paresthesias, no polydipsia and no polyuria. Pertinent negatives for diabetic complications include no autonomic neuropathy, CVA or heart disease. Current diabetic treatment includes oral agent (monotherapy). She is following a diabetic and generally healthy diet. She has not had a previous visit with a dietitian. She rarely participates in exercise. Frequency home blood tests: Does not check her Blood Glucose at home. There is no change in her home blood glucose trend. An ACE inhibitor/angiotensin II receptor blocker is being taken. Eye exam is current (November 2016).  Hypertension This is a chronic problem. The problem is controlled. Pertinent negatives include no chest pain, headaches, palpitations or shortness of breath. Past treatments include ACE inhibitors. There is no history of kidney disease, CAD/MI or CVA.  Hyperlipidemia This is a chronic problem. The problem is controlled. Recent lipid tests were reviewed and are high. Pertinent negatives include no chest pain, leg pain, myalgias or shortness of breath. Current antihyperlipidemic treatment includes statins.  Depression        This is a chronic problem.The problem is unchanged.  Associated symptoms include no fatigue, no myalgias and no headaches.  Past treatments include SSRIs - Selective serotonin reuptake inhibitors.    Past Medical History  Diagnosis Date  . Depression   . Diabetes mellitus without complication ([place])   . Hyperlipidemia   . Osteoporosis   . Hypertension   . Thyroid disease    . Hypothyroidism   . Arthritis   . Breast cancer ([place]) 1983    right breast cancer  . 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 Mastectomy Right 1983    Dr Myrle Sheng  . Breast biopsy Left 11-04-15  . Cataract extraction Bilateral 1990  . Breast biopsy Left 10-10-07  . Cholecystectomy    . Mastectomy Right 1983  . Mastectomy w/ sentinel node biopsy Left 12/12/2015    Procedure: MASTECTOMY WITH SENTINEL LYMPH NODE BIOPSY;  Surgeon: Christene Lye, MD;  Location: [facility] ORS;  Service: General;  Laterality: Left;    Family History  Problem Relation Age of Onset  . Kidney disease Mother   . Heart disease Father   . Stroke Sister   . Cancer Sister     breast  . Breast cancer Sister   . Stroke Brother   . Diabetes Brother   . Hypertension Brother   . Cancer Daughter 47    DCIS/lumpectomy/genetic negative    Social History   Social History  . Marital Status: Married    Spouse Name: N/A  . Number of Children: N/A  . Years of Education: N/A   Occupational History  . Not on file.   Social History Main Topics  . Smoking status: Never Smoker   . Smokeless tobacco: Never Used     Comment: no passive smoke in home  . Alcohol Use: No  . Drug Use: No  . Sexual Activity: No   Other Topics Concern  . Not  on file   Social History Narrative     Current outpatient prescriptions:  .  alendronate (FOSAMAX) 70 MG tablet, Take 1 tablet (70 mg total) by mouth once a week. Take with a full glass of water on an empty stomach., Disp: 52 tablet, Rfl: 0 .  aspirin 81 MG tablet, Take by mouth., Disp: , Rfl:  .  atorvastatin (LIPITOR) 40 MG tablet, Take 1 tablet (40 mg total) by mouth daily., Disp: 90 tablet, Rfl: 1 .  cholecalciferol (VITAMIN D) 1000 UNITS tablet, Take by mouth., Disp: , Rfl:  .  ketoconazole (NIZORAL) 2 % cream, APPLY TO THE SKIN TWICE A DAY TO INFRAMAMMARY AND GROIN AS DIRECTED,  Disp: , Rfl: 0 .  lisinopril (PRINIVIL,ZESTRIL) 20 MG tablet, Take 1 tablet (20 mg total) by mouth daily., Disp: 90 tablet, Rfl: 1 .  metFORMIN (GLUCOPHAGE) 500 MG tablet, Take 1 tablet (500 mg total) by mouth 2 (two) times daily with a meal. (Patient taking differently: Take 1,000 mg by mouth 2 (two) times daily with a meal. ), Disp: 360 tablet, Rfl: 1 .  naproxen sodium (ALEVE) 220 MG tablet, Take by mouth., Disp: , Rfl:  .  oxyCODONE-acetaminophen (ROXICET) 5-325 MG tablet, Take 1 tablet by mouth every 4 (four) hours as needed., Disp: 20 tablet, Rfl: 0 .  sertraline (ZOLOFT) 100 MG tablet, Take by mouth., Disp: , Rfl:  .  SYNTHROID 112 MCG tablet, Take 1 tablet (112 mcg total) by mouth daily., Disp: 90 tablet, Rfl: 2 .  vitamin B-12 (CYANOCOBALAMIN) 1000 MCG tablet, Take 1,000 mcg by mouth every morning., Disp: , Rfl:   No Known Allergies   Review of Systems  Constitutional: Negative for fatigue.  Respiratory: Negative for shortness of breath.   Cardiovascular: Negative for chest pain and palpitations.  Musculoskeletal: Negative for myalgias.  Neurological: Negative for headaches.  Endo/Heme/Allergies: Negative for polydipsia.  Psychiatric/Behavioral: Positive for depression.     Objective  Filed Vitals:   12/29/15 0920  BP: 128/84  Pulse: 84  Temp: 98.2 F (36.8 C)  TempSrc: Oral  Resp: 16  Height: 5\' 4"  (1.626 m)  Weight: 188 lb 11.2 oz (85.594 kg)  SpO2: 99%    Physical Exam  Constitutional: She is oriented to person, place, and time and well-developed, well-nourished, and in no distress.  HENT:  Head: Normocephalic and atraumatic.  Cardiovascular: Normal rate and regular rhythm.   Pulmonary/Chest: Effort normal and breath sounds normal.  Neurological: She is alert and oriented to person, place, and time.  Nursing note and vitals reviewed.      Assessment & Plan  1. Type 2 diabetes mellitus with diabetic nephropathy, without long-term current use of insulin  (HCC) A1c is at goal, consistent with well-controlled diabetes mellitus, no change in therapy. - POCT HgB A1C  2. Benign hypertension Blood pressure is stable, continue present antihypertensive therapy  3. Hyperlipidemia  - Lipid Profile - Comprehensive Metabolic Panel (CMET)  4. Moderate episode of recurrent major depressive disorder (HCC) Stable and improving, refills provided - sertraline (ZOLOFT) 100 MG tablet; Take 1 tablet (100 mg total) by mouth daily.  Dispense: 90 tablet; Refill: 0    Timothee Gali Asad A. [facility] 12/29/2015 9:35 AM

## 2015-12-30 ENCOUNTER — Ambulatory Visit (INDEPENDENT_AMBULATORY_CARE_PROVIDER_SITE_OTHER): Payer: Medicare PPO | Admitting: General Surgery

## 2015-12-30 ENCOUNTER — Encounter: Payer: Self-pay | Admitting: General Surgery

## 2015-12-30 VITALS — BP 128/74 | HR 72 | Resp 14 | Ht 64.0 in | Wt 188.0 lb

## 2015-12-30 DIAGNOSIS — C50412 Malignant neoplasm of upper-outer quadrant of left female breast: Secondary | ICD-10-CM

## 2015-12-30 LAB — COMPREHENSIVE METABOLIC PANEL
ALK PHOS: 59 IU/L (ref 39–117)
ALT: 16 IU/L (ref 0–32)
AST: 12 IU/L (ref 0–40)
Albumin/Globulin Ratio: 2 (ref 1.2–2.2)
Albumin: 4.2 g/dL (ref 3.5–4.8)
BILIRUBIN TOTAL: 0.6 mg/dL (ref 0.0–1.2)
BUN/Creatinine Ratio: 17 (ref 12–28)
BUN: 13 mg/dL (ref 8–27)
CHLORIDE: 102 mmol/L (ref 96–106)
CO2: 24 mmol/L (ref 18–29)
CREATININE: 0.75 mg/dL (ref 0.57–1.00)
Calcium: 9.4 mg/dL (ref 8.7–10.3)
GFR calc non Af Amer: 78 mL/min/{1.73_m2} (ref >59)
GFR, EST AFRICAN AMERICAN: 90 mL/min/{1.73_m2} (ref >59)
GLUCOSE: 186 mg/dL — AB (ref 65–99)
Globulin, Total: 2.1 g/dL (ref 1.5–4.5)
Potassium: 4.7 mmol/L (ref 3.5–5.2)
Sodium: 143 mmol/L (ref 134–144)
Total Protein: 6.3 g/dL (ref 6.0–8.5)

## 2015-12-30 LAB — LIPID PANEL
Chol/HDL Ratio: 2.8 ratio units (ref 0.0–4.4)
Cholesterol, Total: 151 mg/dL (ref 100–199)
HDL: 53 mg/dL (ref >39)
LDL CALC: 74 mg/dL (ref 0–99)
Triglycerides: 119 mg/dL (ref 0–149)
VLDL CHOLESTEROL CAL: 24 mg/dL (ref 5–40)

## 2015-12-30 NOTE — Patient Instructions (Addendum)
Patient to call us on Thursday and let us know what the drainage is.

## 2015-12-30 NOTE — Progress Notes (Signed)
This is a 75 year old female here today for her post op left mastectomy done on 12/12/15. Drain sheet present. I have reviewed the history of present illness with the patient.  Drain 1 was draining less than 63mL of fluid per day. It was removed today. Drain 2 was still draining greater than 72mL per day.  Patient is to continue drain sheet for drain 2 and to call us on Thursday with drainage results.    PCP:  Manuella Ghazi,  This information has been scribed by Gaspar Cola CMA.

## 2016-01-01 ENCOUNTER — Telehealth: Payer: Self-pay | Admitting: *Deleted

## 2016-01-01 NOTE — Telephone Encounter (Signed)
She called to let us know that the drainage was 30 yesterday. She will call tomorrow with amount and drain Helt then be removed if less than 63ml.

## 2016-01-02 ENCOUNTER — Telehealth: Payer: Self-pay

## 2016-01-02 ENCOUNTER — Ambulatory Visit (INDEPENDENT_AMBULATORY_CARE_PROVIDER_SITE_OTHER): Payer: Medicare PPO

## 2016-01-02 DIAGNOSIS — C50412 Malignant neoplasm of upper-outer quadrant of left female breast: Secondary | ICD-10-CM

## 2016-01-02 NOTE — Progress Notes (Deleted)
Patient ID: Solie Hootman Eberle, female   DOB: 1940-10-16, 75 y.o.   MRN: [id number]  Chief Complaint  Patient presents with  . Wound Check    HPI Elbony Sholl Eagon is a 75 y.o. female Patient came in today for a wound check and drain removal. She states that her drainage was 30 cc total yesterday and was 20cc total for today. The wound is clean, with no signs of infection noted. Follow up as scheduled.     HPI  Past Medical History  Diagnosis Date  . Depression   . Diabetes mellitus without complication ([place])   . Hyperlipidemia   . Osteoporosis   . Hypertension   . Thyroid disease   . Hypothyroidism   . Arthritis   . Breast cancer ([place]) 1983    right breast cancer  . 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 Mastectomy Right 1983    Dr Myrle Sheng  . Breast biopsy Left 11-04-15  . Cataract extraction Bilateral 1990  . Breast biopsy Left 10-10-07  . Cholecystectomy    . Mastectomy Right 1983  . Mastectomy w/ sentinel node biopsy Left 12/12/2015    Procedure: MASTECTOMY WITH SENTINEL LYMPH NODE BIOPSY;  Surgeon: Christene Lye, MD;  Location: [facility] ORS;  Service: General;  Laterality: Left;    Family History  Problem Relation Age of Onset  . Kidney disease Mother   . Heart disease Father   . Stroke Sister   . Cancer Sister     breast  . Breast cancer Sister   . Stroke Brother   . Diabetes Brother   . Hypertension Brother   . Cancer Daughter 69    DCIS/lumpectomy/genetic negative    Social History Social History  Substance Use Topics  . Smoking status: Never Smoker   . Smokeless tobacco: Never Used     Comment: no passive smoke in home  . Alcohol Use: No    No Known Allergies  Current Outpatient Prescriptions  Medication Sig Dispense Refill  . alendronate (FOSAMAX) 70 MG tablet Take 1 tablet (70 mg total) by mouth once a week. Take with a full glass of water on an empty stomach. 52  tablet 0  . aspirin 81 MG tablet Take by mouth.    [name] atorvastatin (LIPITOR) 40 MG tablet Take 1 tablet (40 mg total) by mouth daily. 90 tablet 1  . cholecalciferol (VITAMIN D) 1000 UNITS tablet Take by mouth.    [name] ketoconazole (NIZORAL) 2 % cream APPLY TO THE SKIN TWICE A DAY TO INFRAMAMMARY AND GROIN AS DIRECTED  0  . lisinopril (PRINIVIL,ZESTRIL) 20 MG tablet Take 1 tablet (20 mg total) by mouth daily. 90 tablet 1  . metFORMIN (GLUCOPHAGE) 500 MG tablet Take 1 tablet (500 mg total) by mouth 2 (two) times daily with a meal. (Patient taking differently: Take 1,000 mg by mouth 2 (two) times daily with a meal. ) 360 tablet 1  . naproxen sodium (ALEVE) 220 MG tablet Take by mouth.    . oxyCODONE-acetaminophen (ROXICET) 5-325 MG tablet Take 1 tablet by mouth every 4 (four) hours as needed. 20 tablet 0  . sertraline (ZOLOFT) 100 MG tablet Take 1 tablet (100 mg total) by mouth daily. 90 tablet 0  . SYNTHROID 112 MCG tablet Take 1 tablet (112 mcg total) by mouth daily. 90 tablet 2  . vitamin B-12 (CYANOCOBALAMIN) 1000 MCG tablet Take  1,000 mcg by mouth every morning.     No current facility-administered medications for this visit.    Review of Systems Review of Systems  There were no vitals taken for this visit.  Physical Exam Physical Exam  Data Reviewed ***  Assessment    ***    Plan    ***       Lesly Rubenstein 01/02/2016, 10:01 AM

## 2016-01-02 NOTE — Progress Notes (Signed)
Patient ID: Lindsey [name], female   DOB: 1941/01/13, 75 y.o.   MRN: [id number] Patient came in today for a wound check and drain removal. She states that her drainage was 30 cc total yesterday and was 20cc total for today.  Drain sheet is present. The wound is clean, with no signs of infection noted. Drain removed with out resistance. Follow up as scheduled.  This has been scribed by Lesly Rubenstein LPN

## 2016-01-02 NOTE — Telephone Encounter (Signed)
Patient called and states that her drain amount was 20 cc today. She will come in to have her drain taken out today.

## 2016-01-06 ENCOUNTER — Encounter: Payer: Self-pay | Admitting: General Surgery

## 2016-01-15 ENCOUNTER — Ambulatory Visit (INDEPENDENT_AMBULATORY_CARE_PROVIDER_SITE_OTHER): Payer: Medicare PPO | Admitting: General Surgery

## 2016-01-15 ENCOUNTER — Encounter: Payer: Self-pay | Admitting: General Surgery

## 2016-01-15 VITALS — BP 146/88 | HR 72 | Resp 12 | Ht 61.0 in | Wt 186.0 lb

## 2016-01-15 DIAGNOSIS — C50412 Malignant neoplasm of upper-outer quadrant of left female breast: Secondary | ICD-10-CM

## 2016-01-15 NOTE — Patient Instructions (Addendum)
The patient is aware to call back for any questions or concerns. Follow up in 2 week. Activity as tolerated.

## 2016-01-15 NOTE — Progress Notes (Signed)
This is a 75 year old female here today for her post op left mastectomy done on 12/12/15. Drains were removed 2 weeks ago. She feels there is a little fluid that has come back. I have reviewed the history of present illness with the patient.  Incision clean and well healed. Seroma noted in lower flap -50 ml thin serous fluid  Aspirated. Recheck in 2 weeks, Activity as tolerated.   PCP:  Keith Rake  This information has been scribed by Karie Fetch RN, BSN,BC.

## 2016-01-16 ENCOUNTER — Encounter: Payer: Self-pay | Admitting: Family Medicine

## 2016-01-16 ENCOUNTER — Ambulatory Visit
Admission: RE | Admit: 2016-01-16 | Discharge: 2016-01-16 | Disposition: A | Discharge status: Home / Self Care | Payer: Medicare PPO | Source: Ambulatory Visit | Attending: Family Medicine | Admitting: Family Medicine

## 2016-01-16 ENCOUNTER — Ambulatory Visit (INDEPENDENT_AMBULATORY_CARE_PROVIDER_SITE_OTHER): Payer: Medicare PPO | Admitting: Family Medicine

## 2016-01-16 VITALS — BP 127/80 | HR 78 | Temp 97.7°F | Resp 16 | Ht 64.0 in | Wt 187.1 lb

## 2016-01-16 DIAGNOSIS — R600 Localized edema: Secondary | ICD-10-CM | POA: Diagnosis not present

## 2016-01-16 NOTE — Progress Notes (Signed)
Name: Lindsey [name]   MRN: [id number]    DOB: May 25, 1941   Date:01/16/2016       Progress Note  Subjective  Chief Complaint  Chief Complaint  Patient presents with  . Leg Swelling    HPI  Bilateral Leg Swelling: Both legs started swelling for about a week or more. She is on her feet most of the day and reports swelling is usually worse towards the end of the day. No history of blood clots in her legs, no fevers, chills, rashes, or dyspnea.  Past Medical History  Diagnosis Date  . Depression   . Diabetes mellitus without complication ([place])   . Hyperlipidemia   . Osteoporosis   . Hypertension   . Thyroid disease   . Hypothyroidism   . Arthritis   . Breast cancer ([place]) 1983    right breast cancer  . 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 Mastectomy Right 1983    Dr Myrle Sheng  . Breast biopsy Left 11-04-15  . Cataract extraction Bilateral 1990  . Breast biopsy Left 10-10-07  . Cholecystectomy    . Mastectomy Right 1983  . Mastectomy w/ sentinel node biopsy Left 12/12/2015    Procedure: MASTECTOMY WITH SENTINEL LYMPH NODE BIOPSY;  Surgeon: Christene Lye, MD;  Location: [facility] ORS;  Service: General;  Laterality: Left;    Family History  Problem Relation Age of Onset  . Kidney disease Mother   . Heart disease Father   . Stroke Sister   . Cancer Sister     breast  . Breast cancer Sister   . Stroke Brother   . Diabetes Brother   . Hypertension Brother   . Cancer Daughter 45    DCIS/lumpectomy/genetic negative    Social History   Social History  . Marital Status: Married    Spouse Name: N/A  . Number of Children: N/A  . Years of Education: N/A   Occupational History  . Not on file.   Social History Main Topics  . Smoking status: Never Smoker   . Smokeless tobacco: Never Used     Comment: no passive smoke in home  . Alcohol Use: No  . Drug Use: No  . Sexual  Activity: No   Other Topics Concern  . Not on file   Social History Narrative     Current outpatient prescriptions:  .  alendronate (FOSAMAX) 70 MG tablet, Take 1 tablet (70 mg total) by mouth once a week. Take with a full glass of water on an empty stomach., Disp: 52 tablet, Rfl: 0 .  aspirin 81 MG tablet, Take by mouth., Disp: , Rfl:  .  atorvastatin (LIPITOR) 40 MG tablet, Take 1 tablet (40 mg total) by mouth daily., Disp: 90 tablet, Rfl: 1 .  cholecalciferol (VITAMIN D) 1000 UNITS tablet, Take by mouth., Disp: , Rfl:  .  ketoconazole (NIZORAL) 2 % cream, APPLY TO THE SKIN TWICE A DAY TO INFRAMAMMARY AND GROIN AS DIRECTED, Disp: , Rfl: 0 .  lisinopril (PRINIVIL,ZESTRIL) 20 MG tablet, Take 1 tablet (20 mg total) by mouth daily., Disp: 90 tablet, Rfl: 1 .  metFORMIN (GLUCOPHAGE) 500 MG tablet, Take 1 tablet (500 mg total) by mouth 2 (two) times daily with a meal. (Patient taking differently: Take 1,000 mg by mouth 2 (two) times daily with a meal. ), Disp: 360 tablet, Rfl: 1 .  naproxen sodium (  ALEVE) 220 MG tablet, Take by mouth., Disp: , Rfl:  .  sertraline (ZOLOFT) 100 MG tablet, Take 1 tablet (100 mg total) by mouth daily., Disp: 90 tablet, Rfl: 0 .  SYNTHROID 112 MCG tablet, Take 1 tablet (112 mcg total) by mouth daily., Disp: 90 tablet, Rfl: 2 .  vitamin B-12 (CYANOCOBALAMIN) 1000 MCG tablet, Take 1,000 mcg by mouth every morning., Disp: , Rfl:   No Known Allergies   Review of Systems  Constitutional: Negative for fever and chills.  Respiratory: Negative for cough and shortness of breath.   Cardiovascular: Positive for leg swelling. Negative for chest pain.  Musculoskeletal: Negative for myalgias.  Skin: Negative for rash.    Objective  Filed Vitals:   01/16/16 0935  BP: 127/80  Pulse: 78  Temp: 97.7 F (36.5 C)  TempSrc: Oral  Resp: 16  Height: 5\' 4"  (1.626 m)  Weight: 187 lb 1.6 oz (84.868 kg)  SpO2: 98%    Physical Exam  Constitutional: She is well-developed,  well-nourished, and in no distress.  HENT:  Head: Normocephalic and atraumatic.  Cardiovascular: Normal rate and regular rhythm.   Pulmonary/Chest: Effort normal and breath sounds normal.  Musculoskeletal:  Bilateral lower extremity edema, mostly centered around the ankles, no rash visible on lower extremities, no tenderness to palpation.    Nursing note and vitals reviewed.    Assessment & Plan  1. Bilateral lower extremity edema Given recent surgery for breast cancer, we'll rule out DVT. Otherwise swelling likely from dependent edema based on history. Advised to elevate her legs frequently throughout the day - US Venous Img Lower Unilateral Left; Future - US Venous Img Lower Unilateral Right; Future - Comprehensive Metabolic Panel (CMET)   Jacoba Cherney Asad A. [facility] 01/16/2016 9:52 AM

## 2016-01-17 LAB — COMPREHENSIVE METABOLIC PANEL
ALK PHOS: 64 IU/L (ref 39–117)
ALT: 13 IU/L (ref 0–32)
AST: 18 IU/L (ref 0–40)
Albumin/Globulin Ratio: 1.7 (ref 1.2–2.2)
Albumin: 4.5 g/dL (ref 3.5–4.8)
BILIRUBIN TOTAL: 0.8 mg/dL (ref 0.0–1.2)
BUN / CREAT RATIO: 13 (ref 12–28)
BUN: 9 mg/dL (ref 8–27)
CHLORIDE: 104 mmol/L (ref 96–106)
CO2: 25 mmol/L (ref 18–29)
CREATININE: 0.71 mg/dL (ref 0.57–1.00)
Calcium: 10.1 mg/dL (ref 8.7–10.3)
GFR calc Af Amer: 96 mL/min/{1.73_m2} (ref >59)
GFR calc non Af Amer: 83 mL/min/{1.73_m2} (ref >59)
GLUCOSE: 145 mg/dL — AB (ref 65–99)
Globulin, Total: 2.6 g/dL (ref 1.5–4.5)
Potassium: 5.3 mmol/L — ABNORMAL HIGH (ref 3.5–5.2)
Sodium: 150 mmol/L — ABNORMAL HIGH (ref 134–144)
Total Protein: 7.1 g/dL (ref 6.0–8.5)

## 2016-01-29 ENCOUNTER — Ambulatory Visit (INDEPENDENT_AMBULATORY_CARE_PROVIDER_SITE_OTHER): Payer: Medicare PPO | Admitting: General Surgery

## 2016-01-29 ENCOUNTER — Encounter: Payer: Self-pay | Admitting: General Surgery

## 2016-01-29 VITALS — BP 130/70 | HR 74 | Resp 12 | Ht 61.0 in

## 2016-01-29 DIAGNOSIS — C50412 Malignant neoplasm of upper-outer quadrant of left female breast: Secondary | ICD-10-CM

## 2016-01-29 NOTE — Progress Notes (Signed)
This is a 75 year old female here today for her post op left mastectomy done on 12/12/15. Patient states she is doing well. Incision is clean and healing well.  Patient to return and come back with her family to discuss further treatment options. Pathology revealed pT1c primary. Role of Mammoprint in this situation was briefly discussed with patient,  Patient would like her daughters to hear more about this and decide if she wishes to pursue. Otherwise, will start her on anti-hormonal treatment.  I have reviewed the history of present illness with the patient.     PCP:  Keith Rake  This information has been scribed by Gaspar Cola CMA.

## 2016-01-29 NOTE — Patient Instructions (Addendum)
Follow up appointment to be announced.  

## 2016-02-05 ENCOUNTER — Other Ambulatory Visit: Payer: Self-pay | Admitting: Family Medicine

## 2016-02-05 DIAGNOSIS — E875 Hyperkalemia: Secondary | ICD-10-CM

## 2016-02-05 DIAGNOSIS — E870 Hyperosmolality and hypernatremia: Secondary | ICD-10-CM

## 2016-02-06 LAB — COMPREHENSIVE METABOLIC PANEL
ALBUMIN: 4.4 g/dL (ref 3.6–5.1)
ALK PHOS: 50 U/L (ref 33–130)
ALT: 12 U/L (ref 6–29)
AST: 15 U/L (ref 10–35)
BILIRUBIN TOTAL: 0.8 mg/dL (ref 0.2–1.2)
BUN: 20 mg/dL (ref 7–25)
CO2: 25 mmol/L (ref 20–31)
CREATININE: 0.74 mg/dL (ref 0.60–0.93)
Calcium: 9.1 mg/dL (ref 8.6–10.4)
Chloride: 104 mmol/L (ref 98–110)
Glucose, Bld: 144 mg/dL — ABNORMAL HIGH (ref 65–99)
Potassium: 4.3 mmol/L (ref 3.5–5.3)
SODIUM: 138 mmol/L (ref 135–146)
TOTAL PROTEIN: 6.7 g/dL (ref 6.1–8.1)

## 2016-02-19 ENCOUNTER — Other Ambulatory Visit: Payer: Self-pay | Admitting: Family Medicine

## 2016-02-27 ENCOUNTER — Encounter: Payer: Self-pay | Admitting: Family Medicine

## 2016-02-27 ENCOUNTER — Telehealth: Payer: Self-pay | Admitting: Family Medicine

## 2016-02-27 DIAGNOSIS — E039 Hypothyroidism, unspecified: Secondary | ICD-10-CM

## 2016-02-27 MED ORDER — SYNTHROID 112 MCG PO TABS: 112.0000 ug | ORAL_TABLET | Freq: Every day | ORAL | Status: DC

## 2016-02-27 NOTE — Telephone Encounter (Signed)
Patient would like to get enough Synthroid medication until her next appointment on 03/30/16.  Patient's daughter stated that she has been out for the last 3 days.  Please call once complete.

## 2016-02-27 NOTE — Telephone Encounter (Signed)
Patient is requesting a refill on her Synthroid. She was last seen on 01/16/16 and have upcoming appointment for 03/30/16. Daughter states that her mom did lab work and it should have been caught that she needed the TSH ordered. Stated that her mom cannot be without this medication. Asking that you please send refill to [place] (daughter) #: [phone]

## 2016-02-27 NOTE — Telephone Encounter (Signed)
Pt informed

## 2016-02-27 NOTE — Telephone Encounter (Signed)
Prescription for Synthroid has been sent to patient's pharmacy

## 2016-03-17 ENCOUNTER — Encounter: Payer: Self-pay | Admitting: General Surgery

## 2016-03-17 ENCOUNTER — Ambulatory Visit (INDEPENDENT_AMBULATORY_CARE_PROVIDER_SITE_OTHER): Payer: Medicare PPO | Admitting: General Surgery

## 2016-03-17 VITALS — BP 144/80 | HR 74 | Resp 16 | Ht 64.0 in | Wt 192.0 lb

## 2016-03-17 DIAGNOSIS — C50412 Malignant neoplasm of upper-outer quadrant of left female breast: Secondary | ICD-10-CM

## 2016-03-17 MED ORDER — LETROZOLE 2.5 MG PO TABS: 2.5000 mg | ORAL_TABLET | Freq: Every day | ORAL | 12 refills | Status: DC

## 2016-03-17 NOTE — Patient Instructions (Signed)
The patient is aware to call back for any questions or concerns.  

## 2016-03-17 NOTE — Progress Notes (Signed)
Patient ID: Lindsey [name], female   DOB: 1941/01/30, 75 y.o.   MRN: [id number]  Chief Complaint  Patient presents with  . Other    disscussion    HPI Lindsey [name] is a 75 y.o. female here today with her family for a discussion regarding the option of further risk assessment of breast cancer(Mammoprint or Oncotype DX) based on her recent history of invasive mammary carcinoma. Pt's daughter was present today- she has had breast cancer I have reviewed the history of present illness with the patient.  HPI  Past Medical History:  Diagnosis Date  . Arthritis   . Breast cancer ([place]) 1983   right breast cancer  . Breast cancer in female [facility]) 11-04-15   INVASIVE MAMMARY CARCINOMA   . Depression   . Diabetes mellitus without complication ([place])   . Hyperlipidemia   . Hypertension   . Hypothyroidism   . Osteoporosis   . Thyroid disease     Past Surgical History:  Procedure Laterality Date  . BREAST BIOPSY Left 11-04-15  . BREAST BIOPSY Left 10-10-07  . CATARACT EXTRACTION Bilateral 1990  . CHOLECYSTECTOMY    . MASTECTOMY Right 1983   Dr Myrle Sheng  . MASTECTOMY Right 1983  . MASTECTOMY W/ SENTINEL NODE BIOPSY Left 12/12/2015   Procedure: MASTECTOMY WITH SENTINEL LYMPH NODE BIOPSY;  Surgeon: Christene Lye, MD;  Location: [facility] ORS;  Service: General;  Laterality: Left;    Family History  Problem Relation Age of Onset  . Kidney disease Mother   . Heart disease Father   . Stroke Sister   . Cancer Sister     breast  . Breast cancer Sister   . Stroke Brother   . Diabetes Brother   . Hypertension Brother   . Cancer Daughter 63    DCIS/lumpectomy/genetic negative    Social History Social History  Substance Use Topics  . Smoking status: Never Smoker  . Smokeless tobacco: Never Used     Comment: no passive smoke in home  . Alcohol use No    No Known Allergies  Current Outpatient Prescriptions  Medication Sig Dispense Refill  . alendronate (FOSAMAX) 70 MG tablet Take  1 tablet (70 mg total) by mouth once a week. Take with a full glass of water on an empty stomach. 52 tablet 0  . aspirin 81 MG tablet Take by mouth.    [name] atorvastatin (LIPITOR) 40 MG tablet Take 1 tablet (40 mg total) by mouth daily. 90 tablet 1  . cholecalciferol (VITAMIN D) 1000 UNITS tablet Take by mouth.    [name] ketoconazole (NIZORAL) 2 % cream APPLY TO THE SKIN TWICE A DAY TO INFRAMAMMARY AND GROIN AS DIRECTED  0  . lisinopril (PRINIVIL,ZESTRIL) 20 MG tablet Take 1 tablet (20 mg total) by mouth daily. 90 tablet 1  . metFORMIN (GLUCOPHAGE) 500 MG tablet Take 1 tablet (500 mg total) by mouth 2 (two) times daily with a meal. (Patient taking differently: Take 1,000 mg by mouth 2 (two) times daily with a meal. ) 360 tablet 1  . naproxen sodium (ALEVE) 220 MG tablet Take by mouth.    . sertraline (ZOLOFT) 100 MG tablet Take 1 tablet (100 mg total) by mouth daily. 90 tablet 0  . SYNTHROID 112 MCG tablet Take 1 tablet (112 mcg total) by mouth daily. 30 tablet 0  . vitamin B-12 (CYANOCOBALAMIN) 1000 MCG tablet Take 1,000 mcg by mouth every morning.    [name] letrozole (FEMARA) 2.5 MG tablet Take  1 tablet (2.5 mg total) by mouth daily. 30 tablet 12   No current facility-administered medications for this visit.     Review of Systems Review of Systems  Constitutional: Negative.   Respiratory: Negative.   Cardiovascular: Negative.     Blood pressure (!) 144/80, pulse 74, resp. rate 16, height 5\' 4"  (1.626 m), weight 192 lb (87.1 kg).  Physical Exam Physical Exam  Data Reviewed Prior notes  Assessment    History of breast cancer. The patient and her family were educated on the benefits and financial risks of   testing for breast cancer rcurrence risk. Her daughter, who also has a history of breast cancer, had the testing done and was found to have a very low risk of recurrence. Based on the patient's age and the results of her daughter's test, I do not recommend that she have testing done; however,  she was provided all the information she needed to make an informed decision at this time.    Plan    Patient has decided to not move forward with genetic testing at this time. Will initiate antihormonal therapy-Rx sent for Femara       Teagon Kron G 03/19/2016, 11:38 AM

## 2016-03-19 ENCOUNTER — Encounter: Payer: Self-pay | Admitting: General Surgery

## 2016-03-30 ENCOUNTER — Other Ambulatory Visit: Payer: Self-pay | Admitting: Family Medicine

## 2016-03-30 ENCOUNTER — Ambulatory Visit (INDEPENDENT_AMBULATORY_CARE_PROVIDER_SITE_OTHER): Payer: Medicare PPO | Admitting: Family Medicine

## 2016-03-30 ENCOUNTER — Encounter: Payer: Self-pay | Admitting: Family Medicine

## 2016-03-30 VITALS — BP 137/78 | HR 82 | Temp 98.5°F | Resp 17 | Ht 64.0 in | Wt 185.5 lb

## 2016-03-30 DIAGNOSIS — E039 Hypothyroidism, unspecified: Secondary | ICD-10-CM

## 2016-03-30 DIAGNOSIS — IMO0002 Reserved for concepts with insufficient information to code with codable children: Secondary | ICD-10-CM

## 2016-03-30 DIAGNOSIS — E1121 Type 2 diabetes mellitus with diabetic nephropathy: Secondary | ICD-10-CM

## 2016-03-30 DIAGNOSIS — I10 Essential (primary) hypertension: Secondary | ICD-10-CM

## 2016-03-30 DIAGNOSIS — E1165 Type 2 diabetes mellitus with hyperglycemia: Secondary | ICD-10-CM

## 2016-03-30 LAB — GLUCOSE, POCT (MANUAL RESULT ENTRY): POC Glucose: 141 mg/dl — AB (ref 70–99)

## 2016-03-30 LAB — POCT GLYCOSYLATED HEMOGLOBIN (HGB A1C): HEMOGLOBIN A1C: 7.4

## 2016-03-30 LAB — TSH: TSH: 1.09 m[IU]/L

## 2016-03-30 MED ORDER — METFORMIN HCL 1000 MG PO TABS: 1000.0000 mg | ORAL_TABLET | Freq: Two times a day (BID) | ORAL | 0 refills | Status: DC

## 2016-03-30 MED ORDER — SITAGLIPTIN PHOSPHATE 50 MG PO TABS: 50.0000 mg | ORAL_TABLET | Freq: Every day | ORAL | 0 refills | Status: DC

## 2016-03-30 NOTE — Progress Notes (Signed)
Name: Lindsey [name]   MRN: [id number]    DOB: 11-Apr-1941   Date:03/30/2016       Progress Note  Subjective  Chief Complaint  Chief Complaint  Patient presents with  . Follow-up    3 mo    Diabetes  She presents for her follow-up diabetic visit. She has type 2 diabetes mellitus. Her disease course has been worsening. There are no hypoglycemic associated symptoms. Pertinent negatives for hypoglycemia include no headaches. Associated symptoms include fatigue. Pertinent negatives for diabetes include no blurred vision, no chest pain, no polydipsia and no polyuria. Symptoms are stable. Pertinent negatives for diabetic complications include no CVA, heart disease or peripheral neuropathy. Current diabetic treatment includes oral agent (monotherapy). She is following a diabetic and generally healthy diet. Frequency home blood tests: Pt. does not check her Blood Glucose  An ACE inhibitor/angiotensin II receptor blocker is being taken. Eye exam is current.  Thyroid Problem  Presents for follow-up visit. Symptoms include depressed mood, fatigue, hair loss and leg swelling. Patient reports no cold intolerance or constipation. (Symptoms of depressed mood leg swelling and fatigue could also be secondary to recent diagnosis of breast cancer with left mastectomy.)  Hypertension  This is a chronic problem. The problem is unchanged. Pertinent negatives include no blurred vision, chest pain or headaches. Past treatments include ACE inhibitors. Hypertensive end-organ damage includes a thyroid problem. There is no history of CVA.     Past Medical History:  Diagnosis Date  . Arthritis   . Breast cancer ([place]) 1983   right breast cancer  . Breast cancer in female [facility]) 11-04-15   INVASIVE MAMMARY CARCINOMA   . Depression   . Diabetes mellitus without complication ([place])   . Hyperlipidemia   . Hypertension   . Hypothyroidism   . Osteoporosis   . Thyroid disease     Past Surgical History:  Procedure Laterality  Date  . BREAST BIOPSY Left 11-04-15  . BREAST BIOPSY Left 10-10-07  . CATARACT EXTRACTION Bilateral 1990  . CHOLECYSTECTOMY    . MASTECTOMY Right 1983   Dr Myrle Sheng  . MASTECTOMY Right 1983  . MASTECTOMY W/ SENTINEL NODE BIOPSY Left 12/12/2015   Procedure: MASTECTOMY WITH SENTINEL LYMPH NODE BIOPSY;  Surgeon: Christene Lye, MD;  Location: [facility] ORS;  Service: General;  Laterality: Left;    Family History  Problem Relation Age of Onset  . Kidney disease Mother   . Heart disease Father   . Stroke Sister   . Cancer Sister     breast  . Breast cancer Sister   . Stroke Brother   . Diabetes Brother   . Hypertension Brother   . Cancer Daughter 15    DCIS/lumpectomy/genetic negative    Social History   Social History  . Marital status: Married    Spouse name: N/A  . Number of children: N/A  . Years of education: N/A   Occupational History  . Not on file.   Social History Main Topics  . Smoking status: Never Smoker  . Smokeless tobacco: Never Used     Comment: no passive smoke in home  . Alcohol use No  . Drug use: No  . Sexual activity: No   Other Topics Concern  . Not on file   Social History Narrative  . No narrative on file     Current Outpatient Prescriptions:  .  alendronate (FOSAMAX) 70 MG tablet, Take 1 tablet (70 mg total) by mouth once a week. Take with  a full glass of water on an empty stomach., Disp: 52 tablet, Rfl: 0 .  aspirin 81 MG tablet, Take by mouth., Disp: , Rfl:  .  atorvastatin (LIPITOR) 40 MG tablet, Take 1 tablet (40 mg total) by mouth daily., Disp: 90 tablet, Rfl: 1 .  cholecalciferol (VITAMIN D) 1000 UNITS tablet, Take by mouth., Disp: , Rfl:  .  ketoconazole (NIZORAL) 2 % cream, APPLY TO THE SKIN TWICE A DAY TO INFRAMAMMARY AND GROIN AS DIRECTED, Disp: , Rfl: 0 .  letrozole (FEMARA) 2.5 MG tablet, Take 1 tablet (2.5 mg total) by mouth daily., Disp: 30 tablet, Rfl: 12 .  lisinopril (PRINIVIL,ZESTRIL) 20 MG tablet, Take 1 tablet (20  mg total) by mouth daily., Disp: 90 tablet, Rfl: 1 .  metFORMIN (GLUCOPHAGE) 500 MG tablet, Take 1 tablet (500 mg total) by mouth 2 (two) times daily with a meal. (Patient taking differently: Take 1,000 mg by mouth 2 (two) times daily with a meal. ), Disp: 360 tablet, Rfl: 1 .  naproxen sodium (ALEVE) 220 MG tablet, Take by mouth., Disp: , Rfl:  .  sertraline (ZOLOFT) 100 MG tablet, Take 1 tablet (100 mg total) by mouth daily., Disp: 90 tablet, Rfl: 0 .  SYNTHROID 112 MCG tablet, Take 1 tablet (112 mcg total) by mouth daily., Disp: 30 tablet, Rfl: 0 .  vitamin B-12 (CYANOCOBALAMIN) 1000 MCG tablet, Take 1,000 mcg by mouth every morning., Disp: , Rfl:   No Known Allergies   Review of Systems  Constitutional: Positive for fatigue.  Eyes: Negative for blurred vision.  Cardiovascular: Negative for chest pain.  Gastrointestinal: Negative for constipation.  Neurological: Negative for headaches.  Endo/Heme/Allergies: Negative for cold intolerance and polydipsia.    Objective  Vitals:   03/30/16 0942  BP: 137/78  Pulse: 82  Resp: 17  Temp: 98.5 F (36.9 C)  TempSrc: Oral  SpO2: 98%  Weight: 185 lb 8 oz (84.1 kg)  Height: 5\' 4"  (1.626 m)    Physical Exam  Constitutional: She is oriented to person, place, and time and well-developed, well-nourished, and in no distress.  HENT:  Head: Normocephalic and atraumatic.  Cardiovascular: Normal rate, regular rhythm and normal heart sounds.   No murmur heard. Pulmonary/Chest: Effort normal and breath sounds normal. She has no wheezes.  Abdominal: Soft. Bowel sounds are normal.  Musculoskeletal:       Right ankle: She exhibits swelling.       Left ankle: She exhibits swelling.  2+ pitting edema bilateral Lower extremities, no tenderness to palpation.  Neurological: She is alert and oriented to person, place, and time.  Psychiatric: Mood, memory, affect and judgment normal.  Nursing note and vitals reviewed.   Assessment & Plan  1.  Uncontrolled type 2 diabetes mellitus with diabetic nephropathy, without long-term current use of insulin (HCC) A1c is worse, we'll add Sitagliptin 50 mg daily to regimen. - POCT HgB A1C - POCT Glucose (CBG) - Urine Microalbumin w/creat. ratio - metFORMIN (GLUCOPHAGE) 1000 MG tablet; Take 1 tablet (1,000 mg total) by mouth 2 (two) times daily with a meal.  Dispense: 180 tablet; Refill: 0 - sitaGLIPtin (JANUVIA) 50 MG tablet; Take 1 tablet (50 mg total) by mouth daily.  Dispense: 90 tablet; Refill: 0  2. Hypothyroidism, unspecified hypothyroidism type  - TSH  3. Benign hypertension BP stable and controlled on present therapy   Lindsey [name] [facility] 03/30/2016 10:02 AM

## 2016-03-31 LAB — MICROALBUMIN / CREATININE URINE RATIO

## 2016-04-01 LAB — MICROALBUMIN / CREATININE URINE RATIO
CREATININE, URINE: 158 mg/dL (ref 20–320)
MICROALB UR: 3.6 mg/dL
Microalb Creat Ratio: 23 mcg/mg creat (ref <30)

## 2016-04-02 ENCOUNTER — Other Ambulatory Visit: Payer: Self-pay | Admitting: Family Medicine

## 2016-04-02 DIAGNOSIS — E039 Hypothyroidism, unspecified: Secondary | ICD-10-CM

## 2016-05-17 ENCOUNTER — Ambulatory Visit (INDEPENDENT_AMBULATORY_CARE_PROVIDER_SITE_OTHER): Payer: Medicare PPO | Admitting: General Surgery

## 2016-05-17 ENCOUNTER — Encounter: Payer: Self-pay | Admitting: General Surgery

## 2016-05-17 VITALS — BP 134/72 | HR 65 | Resp 12 | Ht 61.0 in | Wt 183.0 lb

## 2016-05-17 DIAGNOSIS — C50412 Malignant neoplasm of upper-outer quadrant of left female breast: Secondary | ICD-10-CM

## 2016-05-17 NOTE — Progress Notes (Signed)
Patient ID: Lindsey [name], female   DOB: [birth year], 75 y.o.   MRN: [id number]  Chief Complaint  Patient presents with  . Follow-up    Breast Cancer    HPI Lindsey [name] is a 75 y.o. female is here today for a 2 month breast cancer follow up. Patient states she is having left axilla pain for two weeks. She has not tried taking anything for the pain. She is tolerating the Femara well.    I have reviewed the history of present illness with the patient.  HPI  Past Medical History:  Diagnosis Date  . Arthritis   . Breast cancer ([place]) 1983   right breast cancer  . Breast cancer in female [facility]) 11-04-15   Left Breast INVASIVE MAMMARY CARCINOMA  T1c, no ER/PR positive Her 2 Negative  . Depression   . Diabetes mellitus without complication ([place])   . Hyperlipidemia   . Hypertension   . Hypothyroidism   . Osteoporosis   . Thyroid disease     Past Surgical History:  Procedure Laterality Date  . BREAST BIOPSY Left 11-04-15  . BREAST BIOPSY Left 10-10-07  . CATARACT EXTRACTION Bilateral 1990  . CHOLECYSTECTOMY    . MASTECTOMY Right 1983   Dr Lindsey [name]  . MASTECTOMY Right 1983  . MASTECTOMY W/ SENTINEL NODE BIOPSY Left 12/12/2015   Procedure: MASTECTOMY WITH SENTINEL LYMPH NODE BIOPSY;  Surgeon: Lindsey Lye, MD;  Location: [facility] ORS;  Service: General;  Laterality: Left;    Family History  Problem Relation Age of Onset  . Kidney disease Mother   . Heart disease Father   . Stroke Sister   . Cancer Sister     breast  . Breast cancer Sister   . Stroke Brother   . Diabetes Brother   . Hypertension Brother   . Cancer Daughter 52    DCIS/lumpectomy/genetic negative    Social History Social History  Substance Use Topics  . Smoking status: Never Smoker  . Smokeless tobacco: Never Used     Comment: no passive smoke in home  . Alcohol use No    No Known Allergies  Current Outpatient Prescriptions  Medication Sig Dispense Refill  . alendronate (FOSAMAX) 70 MG tablet  Take 1 tablet (70 mg total) by mouth once a week. Take with a full glass of water on an empty stomach. 52 tablet 0  . aspirin 81 MG tablet Take by mouth.    [name] atorvastatin (LIPITOR) 40 MG tablet Take 1 tablet (40 mg total) by mouth daily. 90 tablet 1  . cholecalciferol (VITAMIN D) 1000 UNITS tablet Take by mouth.    [name] ketoconazole (NIZORAL) 2 % cream APPLY TO THE SKIN TWICE A DAY TO INFRAMAMMARY AND GROIN AS DIRECTED  0  . letrozole (FEMARA) 2.5 MG tablet Take 1 tablet (2.5 mg total) by mouth daily. 30 tablet 12  . lisinopril (PRINIVIL,ZESTRIL) 20 MG tablet Take 1 tablet (20 mg total) by mouth daily. 90 tablet 1  . metFORMIN (GLUCOPHAGE) 1000 MG tablet Take 1 tablet (1,000 mg total) by mouth 2 (two) times daily with a meal. 180 tablet 0  . naproxen sodium (ALEVE) 220 MG tablet Take by mouth.    . sertraline (ZOLOFT) 100 MG tablet Take 1 tablet (100 mg total) by mouth daily. 90 tablet 0  . sitaGLIPtin (JANUVIA) 50 MG tablet Take 1 tablet (50 mg total) by mouth daily. 90 tablet 0  . SYNTHROID 112 MCG tablet take 1 tablet by mouth  once daily 90 tablet 1  . vitamin B-12 (CYANOCOBALAMIN) 1000 MCG tablet Take 1,000 mcg by mouth every morning.     No current facility-administered medications for this visit.     Review of Systems Review of Systems  Constitutional: Negative.   Respiratory: Negative.   Cardiovascular: Negative.     Blood pressure 134/72, pulse 65, resp. rate 12, height 5\' 1"  (1.549 m), weight 183 lb (83 kg).  Physical Exam Physical Exam  Constitutional: She is oriented to person, place, and time. She appears well-developed and well-nourished.  Eyes: Conjunctivae are normal. No scleral icterus.  Neck: Neck supple. No thyromegaly present.  Cardiovascular: Normal rate, regular rhythm and normal heart sounds.   Pulmonary/Chest: Effort normal and breath sounds normal.  Well healed mastectomy scar  Lymphadenopathy:    She has no cervical adenopathy.    She has no axillary  adenopathy.  Neurological: She is alert and oriented to person, place, and time.  Skin: Skin is warm and dry.    Data Reviewed Prior notes  Assessment    Left breast cancer-stage 1, post mastectomy. Currently on Letrazole and doing well    Plan     Patient to follow up 6 months. Patient to get CA 27.29 and CEA done today.    [name],Lindsey G 05/17/2016, 1:43 PM

## 2016-05-17 NOTE — Patient Instructions (Addendum)
Take Aleve once a day to help with pain as needed. Patient to follow up 6 months. Patient to get CA 27.29 and CEA done today.

## 2016-05-18 LAB — CANCER ANTIGEN 27.29: CA 27.29: 20 U/mL (ref 0.0–38.6)

## 2016-05-18 LAB — CEA: CEA: 2.2 ng/mL (ref 0.0–4.7)

## 2016-05-18 NOTE — Progress Notes (Signed)
Inform pt labs are normal. F/u as scheduled

## 2016-05-19 ENCOUNTER — Telehealth: Payer: Self-pay | Admitting: *Deleted

## 2016-05-19 ENCOUNTER — Other Ambulatory Visit: Payer: Self-pay | Admitting: Family Medicine

## 2016-05-19 NOTE — Telephone Encounter (Signed)
Notified patient as instructed, patient pleased. Discussed follow-up appointments, patient agrees  

## 2016-05-19 NOTE — Telephone Encounter (Signed)
-----   Message from Christene Lye, MD sent at 05/18/2016  5:45 PM EDT ----- Inform pt labs are normal. F/u as scheduled

## 2016-07-01 ENCOUNTER — Encounter: Payer: Self-pay | Admitting: Family Medicine

## 2016-07-01 ENCOUNTER — Ambulatory Visit (INDEPENDENT_AMBULATORY_CARE_PROVIDER_SITE_OTHER): Payer: Medicare PPO | Admitting: Family Medicine

## 2016-07-01 VITALS — BP 136/67 | HR 82 | Temp 98.6°F | Resp 17 | Ht 61.0 in | Wt 182.2 lb

## 2016-07-01 DIAGNOSIS — Z23 Encounter for immunization: Secondary | ICD-10-CM

## 2016-07-01 DIAGNOSIS — E119 Type 2 diabetes mellitus without complications: Secondary | ICD-10-CM | POA: Diagnosis not present

## 2016-07-01 DIAGNOSIS — I10 Essential (primary) hypertension: Secondary | ICD-10-CM

## 2016-07-01 DIAGNOSIS — E785 Hyperlipidemia, unspecified: Secondary | ICD-10-CM

## 2016-07-01 DIAGNOSIS — F331 Major depressive disorder, recurrent, moderate: Secondary | ICD-10-CM

## 2016-07-01 LAB — COMPLETE METABOLIC PANEL WITH GFR
ALK PHOS: 53 U/L (ref 33–130)
ALT: 14 U/L (ref 6–29)
AST: 16 U/L (ref 10–35)
Albumin: 4.6 g/dL (ref 3.6–5.1)
BUN: 16 mg/dL (ref 7–25)
CHLORIDE: 104 mmol/L (ref 98–110)
CO2: 30 mmol/L (ref 20–31)
Calcium: 9.8 mg/dL (ref 8.6–10.4)
Creat: 0.77 mg/dL (ref 0.60–0.93)
GFR, Est African American: 87 mL/min (ref >=60)
GFR, Est Non African American: 75 mL/min (ref >=60)
Glucose, Bld: 140 mg/dL — ABNORMAL HIGH (ref 65–99)
POTASSIUM: 4.6 mmol/L (ref 3.5–5.3)
SODIUM: 142 mmol/L (ref 135–146)
Total Bilirubin: 1 mg/dL (ref 0.2–1.2)
Total Protein: 6.9 g/dL (ref 6.1–8.1)

## 2016-07-01 LAB — POCT UA - MICROALBUMIN
ALBUMIN/CREATININE RATIO, URINE, POC: NEGATIVE
Creatinine, POC: NEGATIVE mg/dL
MICROALBUMIN (UR) POC: NEGATIVE mg/L

## 2016-07-01 LAB — LIPID PANEL
CHOL/HDL RATIO: 2.8 ratio (ref <5.0)
Cholesterol: 131 mg/dL (ref <200)
HDL: 46 mg/dL — ABNORMAL LOW (ref >50)
LDL Cholesterol: 63 mg/dL
Triglycerides: 112 mg/dL (ref <150)
VLDL: 22 mg/dL (ref <30)

## 2016-07-01 LAB — GLUCOSE, POCT (MANUAL RESULT ENTRY): POC GLUCOSE: 129 mg/dL — AB (ref 70–99)

## 2016-07-01 LAB — POCT GLYCOSYLATED HEMOGLOBIN (HGB A1C): HEMOGLOBIN A1C: 6.4

## 2016-07-01 MED ORDER — SERTRALINE HCL 100 MG PO TABS: 100.0000 mg | ORAL_TABLET | Freq: Every day | ORAL | 0 refills | Status: DC

## 2016-07-01 MED ORDER — LISINOPRIL 20 MG PO TABS: 20.0000 mg | ORAL_TABLET | Freq: Every day | ORAL | 1 refills | Status: DC

## 2016-07-01 NOTE — Progress Notes (Signed)
Name: Lindsey [name]   MRN: [id number]    DOB: 11-16-1940   Date:07/01/2016       Progress Note  Subjective  Chief Complaint  Chief Complaint  Patient presents with  . Follow-up    3 mo  . Medication Refill    Diabetes  She presents for her follow-up diabetic visit. She has type 2 diabetes mellitus. Her disease course has been stable. Pertinent negatives for hypoglycemia include no headaches. Pertinent negatives for diabetes include no blurred vision, no chest pain, no fatigue, no foot paresthesias, no polydipsia and no polyuria. Pertinent negatives for diabetic complications include no autonomic neuropathy, CVA or heart disease. Current diabetic treatment includes oral agent (dual therapy). She is following a generally healthy diet. She has not had a previous visit with a dietitian. She rarely participates in exercise. Frequency home blood tests: Does not check her Blood Glucose at home. Home blood sugar record trend: does not check her Blood Glucose in AM. An ACE inhibitor/angiotensin II receptor blocker is being taken. Eye exam is current (November 2016).  Hypertension  This is a chronic problem. The problem is controlled. Pertinent negatives include no blurred vision, chest pain, headaches, palpitations or shortness of breath. Past treatments include ACE inhibitors. There is no history of kidney disease, CAD/MI or CVA.  Hyperlipidemia  This is a chronic problem. The problem is controlled. Recent lipid tests were reviewed and are high. Pertinent negatives include no chest pain, leg pain, myalgias or shortness of breath. Current antihyperlipidemic treatment includes statins.  Depression         This is a chronic problem.  The onset quality is gradual. The problem is unchanged.  Associated symptoms include no fatigue, no helplessness, no hopelessness, no decreased interest, no myalgias, no headaches and not sad.  Past treatments include SSRIs - Selective serotonin reuptake inhibitors.   Past  Medical History:  Diagnosis Date  . Arthritis   . Breast cancer ([place]) 1983   right breast cancer  . Breast cancer in female [facility]) 11-04-15   Left Breast INVASIVE MAMMARY CARCINOMA  T1c, no ER/PR positive Her 2 Negative  . Depression   . Diabetes mellitus without complication ([place])   . Hyperlipidemia   . Hypertension   . Hypothyroidism   . Osteoporosis   . Thyroid disease     Past Surgical History:  Procedure Laterality Date  . BREAST BIOPSY Left 11-04-15  . BREAST BIOPSY Left 10-10-07  . CATARACT EXTRACTION Bilateral 1990  . CHOLECYSTECTOMY    . MASTECTOMY Right 1983   Dr Myrle Sheng  . MASTECTOMY Right 1983  . MASTECTOMY W/ SENTINEL NODE BIOPSY Left 12/12/2015   Procedure: MASTECTOMY WITH SENTINEL LYMPH NODE BIOPSY;  Surgeon: Christene Lye, MD;  Location: [facility] ORS;  Service: General;  Laterality: Left;    Family History  Problem Relation Age of Onset  . Kidney disease Mother   . Heart disease Father   . Stroke Sister   . Cancer Sister     breast  . Breast cancer Sister   . Stroke Brother   . Diabetes Brother   . Hypertension Brother   . Cancer Daughter 38    DCIS/lumpectomy/genetic negative    Social History   Social History  . Marital status: Married    Spouse name: N/A  . Number of children: N/A  . Years of education: N/A   Occupational History  . Not on file.   Social History Main Topics  . Smoking status: Never Smoker  .  Smokeless tobacco: Never Used     Comment: no passive smoke in home  . Alcohol use No  . Drug use: No  . Sexual activity: No   Other Topics Concern  . Not on file   Social History Narrative  . No narrative on file     Current Outpatient Prescriptions:  .  alendronate (FOSAMAX) 70 MG tablet, Take 1 tablet (70 mg total) by mouth once a week. Take with a full glass of water on an empty stomach., Disp: 52 tablet, Rfl: 0 .  aspirin 81 MG tablet, Take by mouth., Disp: , Rfl:  .  atorvastatin (LIPITOR) 40 MG tablet, TAKE 1  TABLET EVERY DAY, Disp: 90 tablet, Rfl: 1 .  cholecalciferol (VITAMIN D) 1000 UNITS tablet, Take by mouth., Disp: , Rfl:  .  ketoconazole (NIZORAL) 2 % cream, APPLY TO THE SKIN TWICE A DAY TO INFRAMAMMARY AND GROIN AS DIRECTED, Disp: , Rfl: 0 .  letrozole (FEMARA) 2.5 MG tablet, Take 1 tablet (2.5 mg total) by mouth daily., Disp: 30 tablet, Rfl: 12 .  lisinopril (PRINIVIL,ZESTRIL) 20 MG tablet, TAKE 1 TABLET EVERY DAY, Disp: 90 tablet, Rfl: 1 .  metFORMIN (GLUCOPHAGE) 1000 MG tablet, Take 1 tablet (1,000 mg total) by mouth 2 (two) times daily with a meal., Disp: 180 tablet, Rfl: 0 .  naproxen sodium (ALEVE) 220 MG tablet, Take by mouth., Disp: , Rfl:  .  sertraline (ZOLOFT) 100 MG tablet, Take 1 tablet (100 mg total) by mouth daily., Disp: 90 tablet, Rfl: 0 .  sitaGLIPtin (JANUVIA) 50 MG tablet, Take 1 tablet (50 mg total) by mouth daily., Disp: 90 tablet, Rfl: 0 .  SYNTHROID 112 MCG tablet, take 1 tablet by mouth once daily, Disp: 90 tablet, Rfl: 1 .  vitamin B-12 (CYANOCOBALAMIN) 1000 MCG tablet, Take 1,000 mcg by mouth every morning., Disp: , Rfl:   No Known Allergies   Review of Systems  Constitutional: Negative for fatigue.  Eyes: Negative for blurred vision.  Respiratory: Negative for shortness of breath.   Cardiovascular: Negative for chest pain and palpitations.  Musculoskeletal: Negative for myalgias.  Neurological: Negative for headaches.  Endo/Heme/Allergies: Negative for polydipsia.  Psychiatric/Behavioral: Positive for depression.    Objective  Vitals:   07/01/16 0928  BP: 136/67  Pulse: 82  Resp: 17  Temp: 98.6 F (37 C)  TempSrc: Oral  SpO2: 98%  Weight: 182 lb 3.2 oz (82.6 kg)  Height: 5\' 1"  (1.549 m)    Physical Exam  Constitutional: She is oriented to person, place, and time and well-developed, well-nourished, and in no distress.  HENT:  Head: Normocephalic and atraumatic.  Cardiovascular: Normal rate and regular rhythm.   Pulmonary/Chest: Effort normal  and breath sounds normal.  Musculoskeletal:  Bilateral lower extremity edema, mostly centered around the ankles, no rash visible on lower extremities, tenderness to palpation.    Neurological: She is alert and oriented to person, place, and time.  Psychiatric: Mood, memory, affect and judgment normal.  Nursing note and vitals reviewed.   Recent Results (from the past 2160 hour(s))  CEA     Status: None   Collection Time: 05/17/16 10:12 AM  Result Value Ref Range   CEA 2.2 0.0 - 4.7 ng/mL    Comment:        Roche ECLIA methodology       Nonsmokers  <3.9  Smokers     <5.6   Cancer antigen 27.29     Status: None   Collection Time: 05/17/16 10:12 AM  Result Value Ref Range   CA 27.29 20.0 0.0 - 38.6 U/mL    Comment: Research scientist (life sciences)     Assessment & Plan  1. Need for immunization against influenza  - Flu vaccine HIGH DOSE PF (Fluzone High dose)  2. Moderate episode of recurrent major depressive disorder (HCC) Stable and in remission, refill for sertraline provided - sertraline (ZOLOFT) 100 MG tablet; Take 1 tablet (100 mg total) by mouth daily.  Dispense: 90 tablet; Refill: 0  3. Hyperlipidemia, unspecified hyperlipidemia type  - Lipid Profile - COMPLETE METABOLIC PANEL WITH GFR  4. Controlled type 2 diabetes mellitus without complication, without long-term current use of insulin (HCC) Point-of-care A1c 6.4%, glucose is elevated at 1 29 mg/dL and urine microalbumin is negative. Continue on metformin twice daily and recheck in 3 months - POCT HgB A1C - POCT Glucose (CBG) - POCT UA - Microalbumin  5. Benign hypertension  - lisinopril (PRINIVIL,ZESTRIL) 20 MG tablet; Take 1 tablet (20 mg total) by mouth daily.  Dispense: 90 tablet; Refill: 1   Brantley Naser [facility] 07/01/2016 9:48 AM

## 2016-07-30 ENCOUNTER — Other Ambulatory Visit: Payer: Self-pay | Admitting: Family Medicine

## 2016-07-30 DIAGNOSIS — E1121 Type 2 diabetes mellitus with diabetic nephropathy: Secondary | ICD-10-CM

## 2016-07-30 DIAGNOSIS — IMO0002 Reserved for concepts with insufficient information to code with codable children: Secondary | ICD-10-CM

## 2016-07-30 DIAGNOSIS — E1165 Type 2 diabetes mellitus with hyperglycemia: Principal | ICD-10-CM

## 2016-08-02 NOTE — Telephone Encounter (Signed)
Medication has been refilled and sent to [facility]

## 2016-08-31 DIAGNOSIS — E113393 Type 2 diabetes mellitus with moderate nonproliferative diabetic retinopathy without macular edema, bilateral: Secondary | ICD-10-CM | POA: Diagnosis not present

## 2016-08-31 DIAGNOSIS — Z961 Presence of intraocular lens: Secondary | ICD-10-CM | POA: Diagnosis not present

## 2016-09-08 DIAGNOSIS — M1711 Unilateral primary osteoarthritis, right knee: Secondary | ICD-10-CM | POA: Diagnosis not present

## 2016-09-08 DIAGNOSIS — M19041 Primary osteoarthritis, right hand: Secondary | ICD-10-CM | POA: Diagnosis not present

## 2016-09-08 DIAGNOSIS — M7552 Bursitis of left shoulder: Secondary | ICD-10-CM | POA: Diagnosis not present

## 2016-09-08 DIAGNOSIS — M19042 Primary osteoarthritis, left hand: Secondary | ICD-10-CM | POA: Diagnosis not present

## 2016-09-30 ENCOUNTER — Ambulatory Visit (INDEPENDENT_AMBULATORY_CARE_PROVIDER_SITE_OTHER): Payer: PPO | Admitting: Family Medicine

## 2016-09-30 ENCOUNTER — Encounter: Payer: Self-pay | Admitting: Family Medicine

## 2016-09-30 VITALS — BP 138/74 | HR 77 | Temp 98.0°F | Resp 16 | Ht 61.0 in | Wt 179.6 lb

## 2016-09-30 DIAGNOSIS — E559 Vitamin D deficiency, unspecified: Secondary | ICD-10-CM | POA: Diagnosis not present

## 2016-09-30 DIAGNOSIS — M810 Age-related osteoporosis without current pathological fracture: Secondary | ICD-10-CM

## 2016-09-30 DIAGNOSIS — Z1211 Encounter for screening for malignant neoplasm of colon: Secondary | ICD-10-CM | POA: Diagnosis not present

## 2016-09-30 DIAGNOSIS — Z0000 Encounter for general adult medical examination without abnormal findings: Secondary | ICD-10-CM

## 2016-09-30 LAB — COMPLETE METABOLIC PANEL WITH GFR
ALT: 14 U/L (ref 6–29)
AST: 18 U/L (ref 10–35)
Albumin: 4.4 g/dL (ref 3.6–5.1)
Alkaline Phosphatase: 50 U/L (ref 33–130)
BUN: 18 mg/dL (ref 7–25)
CHLORIDE: 105 mmol/L (ref 98–110)
CO2: 29 mmol/L (ref 20–31)
Calcium: 9.4 mg/dL (ref 8.6–10.4)
Creat: 0.73 mg/dL (ref 0.60–0.93)
GFR, EST NON AFRICAN AMERICAN: 80 mL/min (ref >=60)
GFR, Est African American: >89 mL/min (ref >=60)
Glucose, Bld: 128 mg/dL — ABNORMAL HIGH (ref 65–99)
POTASSIUM: 4.1 mmol/L (ref 3.5–5.3)
SODIUM: 143 mmol/L (ref 135–146)
Total Bilirubin: 0.8 mg/dL (ref 0.2–1.2)
Total Protein: 6.7 g/dL (ref 6.1–8.1)

## 2016-09-30 NOTE — Progress Notes (Signed)
Name: Lindsey [name]   MRN: [id number]    DOB: June 03, 1941   Date:09/30/2016       Progress Note  Subjective  Chief Complaint  Chief Complaint  Patient presents with  . Annual Exam    CPE  . Labs Only    fasting    HPI  Pt. Presents for Complete Physical Exam.  She has history of left and right breast cancer, s/p bilateral mastectomy and follows up with Dr. Jamal Collin. She did not have a colonoscopy, will order Cologuard today.  She has Osteoporosis, last DEXA scan in February 2016, on Alendronate.   Past Medical History:  Diagnosis Date  . Arthritis   . Breast cancer ([place]) 1983   right breast cancer  . Breast cancer in female [facility]) 11-04-15   Left Breast INVASIVE MAMMARY CARCINOMA  T1c, no ER/PR positive Her 2 Negative  . Depression   . Diabetes mellitus without complication ([place])   . Hyperlipidemia   . Hypertension   . Hypothyroidism   . Osteoporosis   . Thyroid disease     Past Surgical History:  Procedure Laterality Date  . BREAST BIOPSY Left 11-04-15  . BREAST BIOPSY Left 10-10-07  . CATARACT EXTRACTION Bilateral 1990  . CHOLECYSTECTOMY    . MASTECTOMY Right 1983   Dr Myrle Sheng  . MASTECTOMY Right 1983  . MASTECTOMY W/ SENTINEL NODE BIOPSY Left 12/12/2015   Procedure: MASTECTOMY WITH SENTINEL LYMPH NODE BIOPSY;  Surgeon: Christene Lye, MD;  Location: [facility] ORS;  Service: General;  Laterality: Left;    Family History  Problem Relation Age of Onset  . Kidney disease Mother   . Heart disease Father   . Stroke Sister   . Cancer Sister     breast  . Breast cancer Sister   . Stroke Brother   . Diabetes Brother   . Hypertension Brother   . Cancer Daughter 27    DCIS/lumpectomy/genetic negative    Social History   Social History  . Marital status: Married    Spouse name: N/A  . Number of children: N/A  . Years of education: N/A   Occupational History  . Not on file.   Social History Main Topics  . Smoking status: Never Smoker  . Smokeless tobacco:  Never Used     Comment: no passive smoke in home  . Alcohol use No  . Drug use: No  . Sexual activity: No   Other Topics Concern  . Not on file   Social History Narrative  . No narrative on file     Current Outpatient Prescriptions:  .  alendronate (FOSAMAX) 70 MG tablet, Take 1 tablet (70 mg total) by mouth once a week. Take with a full glass of water on an empty stomach., Disp: 52 tablet, Rfl: 0 .  aspirin 81 MG tablet, Take by mouth., Disp: , Rfl:  .  atorvastatin (LIPITOR) 40 MG tablet, TAKE 1 TABLET EVERY DAY, Disp: 90 tablet, Rfl: 1 .  cholecalciferol (VITAMIN D) 1000 UNITS tablet, Take by mouth., Disp: , Rfl:  .  ketoconazole (NIZORAL) 2 % cream, APPLY TO THE SKIN TWICE A DAY TO INFRAMAMMARY AND GROIN AS DIRECTED, Disp: , Rfl: 0 .  letrozole (FEMARA) 2.5 MG tablet, Take 1 tablet (2.5 mg total) by mouth daily., Disp: 30 tablet, Rfl: 12 .  lisinopril (PRINIVIL,ZESTRIL) 20 MG tablet, Take 1 tablet (20 mg total) by mouth daily., Disp: 90 tablet, Rfl: 1 .  metFORMIN (GLUCOPHAGE) 1000 MG tablet, TAKE  1 TABLET TWICE DAILY WITH A MEAL, Disp: 180 tablet, Rfl: 0 .  naproxen sodium (ALEVE) 220 MG tablet, Take by mouth., Disp: , Rfl:  .  sertraline (ZOLOFT) 100 MG tablet, Take 1 tablet (100 mg total) by mouth daily., Disp: 90 tablet, Rfl: 0 .  sitaGLIPtin (JANUVIA) 50 MG tablet, Take 1 tablet (50 mg total) by mouth daily., Disp: 90 tablet, Rfl: 0 .  SYNTHROID 112 MCG tablet, take 1 tablet by mouth once daily, Disp: 90 tablet, Rfl: 1 .  vitamin B-12 (CYANOCOBALAMIN) 1000 MCG tablet, Take 1,000 mcg by mouth every morning., Disp: , Rfl:   No Known Allergies   Review of Systems  Constitutional: Negative for chills, fever and malaise/fatigue.  HENT: Negative for congestion, ear pain and sore throat.   Eyes: Negative for blurred vision and double vision.  Respiratory: Negative for cough, shortness of breath and wheezing.   Cardiovascular: Negative for chest pain and leg swelling.   Gastrointestinal: Negative for abdominal pain, blood in stool, constipation (occasional constipation.), diarrhea, nausea and vomiting.  Genitourinary: Negative for dysuria and hematuria.  Musculoskeletal: Positive for back pain. Negative for joint pain and neck pain.  Skin: Negative for itching and rash.  Neurological: Negative for dizziness and headaches (occasional headaches).  Psychiatric/Behavioral: Positive for depression. The patient is not nervous/anxious and does not have insomnia.      Objective  Vitals:   09/30/16 0921  BP: 138/74  Pulse: 77  Resp: 16  Temp: 98 F (36.7 C)  TempSrc: Oral  SpO2: 99%  Weight: 179 lb 9.6 oz (81.5 kg)  Height: 5\' 1"  (1.549 m)    Physical Exam  Constitutional: She is oriented to person, place, and time and well-developed, well-nourished, and in no distress.  HENT:  Head: Normocephalic and atraumatic.  Eyes: Pupils are equal, round, and reactive to light.  Cardiovascular: Normal rate, regular rhythm and normal heart sounds.   No murmur heard. Pulmonary/Chest: Effort normal and breath sounds normal. She has no wheezes.  Abdominal: Soft. Bowel sounds are normal. There is no tenderness.  Genitourinary:  Genitourinary Comments: deferred  Musculoskeletal:       Right ankle: She exhibits no swelling. No tenderness.       Left ankle: She exhibits no swelling. No tenderness.  Neurological: She is alert and oriented to person, place, and time.  Skin: Skin is warm and dry.  Psychiatric: Mood, memory, affect and judgment normal.  Nursing note and vitals reviewed.    Assessment & Plan  1. Well woman exam without gynecological exam Obtain age-appropriate laboratory screening - Vitamin D (25 hydroxy) - COMPLETE METABOLIC PANEL WITH GFR  2. Osteoporosis, post-menopausal  - DG Bone Density; Future  3. Colon cancer screening  - Cologuard   Hina Gupta [facility] 09/30/2016 9:36 AM

## 2016-10-01 LAB — VITAMIN D 25 HYDROXY (VIT D DEFICIENCY, FRACTURES): VIT D 25 HYDROXY: 42 ng/mL (ref 30–100)

## 2016-10-28 ENCOUNTER — Encounter: Payer: Self-pay | Admitting: Family Medicine

## 2016-10-28 ENCOUNTER — Ambulatory Visit (INDEPENDENT_AMBULATORY_CARE_PROVIDER_SITE_OTHER): Payer: PPO | Admitting: Family Medicine

## 2016-10-28 VITALS — BP 136/73 | HR 72 | Temp 98.0°F | Resp 16 | Ht 61.0 in | Wt 179.8 lb

## 2016-10-28 DIAGNOSIS — F331 Major depressive disorder, recurrent, moderate: Secondary | ICD-10-CM

## 2016-10-28 DIAGNOSIS — I10 Essential (primary) hypertension: Secondary | ICD-10-CM | POA: Diagnosis not present

## 2016-10-28 DIAGNOSIS — E7800 Pure hypercholesterolemia, unspecified: Secondary | ICD-10-CM

## 2016-10-28 DIAGNOSIS — E119 Type 2 diabetes mellitus without complications: Secondary | ICD-10-CM | POA: Diagnosis not present

## 2016-10-28 LAB — POCT GLYCOSYLATED HEMOGLOBIN (HGB A1C): HEMOGLOBIN A1C: 6.3

## 2016-10-28 MED ORDER — ATORVASTATIN CALCIUM 40 MG PO TABS: 40.0000 mg | ORAL_TABLET | Freq: Every day | ORAL | 1 refills | Status: DC

## 2016-10-28 MED ORDER — SERTRALINE HCL 100 MG PO TABS: 100.0000 mg | ORAL_TABLET | Freq: Every day | ORAL | 0 refills | Status: DC

## 2016-10-28 MED ORDER — METFORMIN HCL 1000 MG PO TABS: ORAL_TABLET | ORAL | 0 refills | Status: DC

## 2016-10-28 MED ORDER — SITAGLIPTIN PHOSPHATE 50 MG PO TABS: 50.0000 mg | ORAL_TABLET | Freq: Every day | ORAL | 0 refills | Status: DC

## 2016-10-28 NOTE — Progress Notes (Signed)
Name: Lindsey [name]   MRN: [id number]    DOB: Apr 10, 1941   Date:10/28/2016       Progress Note  Subjective  Chief Complaint  Chief Complaint  Patient presents with  . Follow-up    1 mo  . Medication Refill    Diabetes  She presents for her follow-up diabetic visit. She has type 2 diabetes mellitus. Her disease course has been stable. Pertinent negatives for hypoglycemia include no headaches. Pertinent negatives for diabetes include no blurred vision, no chest pain, no foot paresthesias, no polydipsia and no polyuria. Pertinent negatives for diabetic complications include no autonomic neuropathy, CVA or heart disease. Current diabetic treatment includes oral agent (dual therapy). She is following a generally healthy diet. She has not had a previous visit with a dietitian. She rarely participates in exercise. Frequency home blood tests: Does not check her Blood Glucose at home. Home blood sugar record trend: does not check her Blood Glucose in AM. An ACE inhibitor/angiotensin II receptor blocker is being taken. Eye exam is current (November 2017).  Hypertension  This is a chronic problem. The problem is controlled. Pertinent negatives include no blurred vision, chest pain, headaches, palpitations or shortness of breath. Past treatments include ACE inhibitors. There is no history of kidney disease, CAD/MI or CVA.  Hyperlipidemia  This is a chronic problem. The problem is controlled. Recent lipid tests were reviewed and are high. Pertinent negatives include no chest pain, leg pain, myalgias or shortness of breath. Current antihyperlipidemic treatment includes statins.     Past Medical History:  Diagnosis Date  . Arthritis   . Breast cancer ([place]) 1983   right breast cancer  . Breast cancer in female [facility]) 11-04-15   Left Breast INVASIVE MAMMARY CARCINOMA  T1c, no ER/PR positive Her 2 Negative  . Depression   . Diabetes mellitus without complication ([place])   . Hyperlipidemia   . Hypertension   .  Hypothyroidism   . Osteoporosis   . Thyroid disease     Past Surgical History:  Procedure Laterality Date  . BREAST BIOPSY Left 11-04-15  . BREAST BIOPSY Left 10-10-07  . CATARACT EXTRACTION Bilateral 1990  . CHOLECYSTECTOMY    . MASTECTOMY Right 1983   Dr Myrle Sheng  . MASTECTOMY Right 1983  . MASTECTOMY W/ SENTINEL NODE BIOPSY Left 12/12/2015   Procedure: MASTECTOMY WITH SENTINEL LYMPH NODE BIOPSY;  Surgeon: Christene Lye, MD;  Location: [facility] ORS;  Service: General;  Laterality: Left;    Family History  Problem Relation Age of Onset  . Kidney disease Mother   . Heart disease Father   . Stroke Sister   . Cancer Sister     breast  . Breast cancer Sister   . Stroke Brother   . Diabetes Brother   . Hypertension Brother   . Cancer Daughter 48    DCIS/lumpectomy/genetic negative    Social History   Social History  . Marital status: Married    Spouse name: N/A  . Number of children: N/A  . Years of education: N/A   Occupational History  . Not on file.   Social History Main Topics  . Smoking status: Never Smoker  . Smokeless tobacco: Never Used     Comment: no passive smoke in home  . Alcohol use No  . Drug use: No  . Sexual activity: No   Other Topics Concern  . Not on file   Social History Narrative  . No narrative on file  Current Outpatient Prescriptions:  .  alendronate (FOSAMAX) 70 MG tablet, Take 1 tablet (70 mg total) by mouth once a week. Take with a full glass of water on an empty stomach., Disp: 52 tablet, Rfl: 0 .  aspirin 81 MG tablet, Take by mouth., Disp: , Rfl:  .  atorvastatin (LIPITOR) 40 MG tablet, TAKE 1 TABLET EVERY DAY, Disp: 90 tablet, Rfl: 1 .  cholecalciferol (VITAMIN D) 1000 UNITS tablet, Take by mouth., Disp: , Rfl:  .  ketoconazole (NIZORAL) 2 % cream, APPLY TO THE SKIN TWICE A DAY TO INFRAMAMMARY AND GROIN AS DIRECTED, Disp: , Rfl: 0 .  letrozole (FEMARA) 2.5 MG tablet, Take 1 tablet (2.5 mg total) by mouth daily., Disp:  30 tablet, Rfl: 12 .  lisinopril (PRINIVIL,ZESTRIL) 20 MG tablet, Take 1 tablet (20 mg total) by mouth daily., Disp: 90 tablet, Rfl: 1 .  metFORMIN (GLUCOPHAGE) 1000 MG tablet, TAKE 1 TABLET TWICE DAILY WITH A MEAL, Disp: 180 tablet, Rfl: 0 .  naproxen sodium (ALEVE) 220 MG tablet, Take by mouth., Disp: , Rfl:  .  sertraline (ZOLOFT) 100 MG tablet, Take 1 tablet (100 mg total) by mouth daily., Disp: 90 tablet, Rfl: 0 .  sitaGLIPtin (JANUVIA) 50 MG tablet, Take 1 tablet (50 mg total) by mouth daily., Disp: 90 tablet, Rfl: 0 .  SYNTHROID 112 MCG tablet, take 1 tablet by mouth once daily, Disp: 90 tablet, Rfl: 1 .  vitamin B-12 (CYANOCOBALAMIN) 1000 MCG tablet, Take 1,000 mcg by mouth every morning., Disp: , Rfl:   No Known Allergies   Review of Systems  Eyes: Negative for blurred vision.  Respiratory: Negative for shortness of breath.   Cardiovascular: Negative for chest pain and palpitations.  Musculoskeletal: Negative for myalgias.  Neurological: Negative for headaches.  Endo/Heme/Allergies: Negative for polydipsia.     Objective  Vitals:   10/28/16 1011  BP: 136/73  Pulse: 72  Resp: 16  Temp: 98 F (36.7 C)  TempSrc: Oral  SpO2: 99%  Weight: 179 lb 12.8 oz (81.6 kg)  Height: 5\' 1"  (1.549 m)    Physical Exam  Constitutional: She is oriented to person, place, and time and well-developed, well-nourished, and in no distress.  HENT:  Head: Normocephalic and atraumatic.  Cardiovascular: Normal rate, regular rhythm and normal heart sounds.   No murmur heard. Pulmonary/Chest: Effort normal and breath sounds normal. She has no wheezes.  Abdominal: Soft. Bowel sounds are normal. There is no tenderness.  Musculoskeletal: She exhibits edema (trace pitting edema bilateral ankles).  Neurological: She is alert and oriented to person, place, and time.  Psychiatric: Mood, memory, affect and judgment normal.  Nursing note and vitals reviewed.     Assessment & Plan  1. Moderate  episode of recurrent major depressive disorder (HCC)  - sertraline (ZOLOFT) 100 MG tablet; Take 1 tablet (100 mg total) by mouth daily.  Dispense: 90 tablet; Refill: 0  2. Controlled type 2 diabetes mellitus without complication, without long-term current use of insulin (HCC) Point-of-care A1c 6.3%, well-controlled diabetes - metFORMIN (GLUCOPHAGE) 1000 MG tablet; TAKE 1 TABLET TWICE DAILY WITH A MEAL  Dispense: 180 tablet; Refill: 0 - sitaGLIPtin (JANUVIA) 50 MG tablet; Take 1 tablet (50 mg total) by mouth daily.  Dispense: 90 tablet; Refill: 0 - POCT CBG (Fasting - Glucose) - POCT glycosylated hemoglobin (Hb A1C)  3. Benign hypertension BP stable on present antihypertensive therapy  4. Pure hypercholesterolemia  - atorvastatin (LIPITOR) 40 MG tablet; Take 1 tablet (40 mg total) by  mouth daily.  Dispense: 90 tablet; Refill: 1   Narcissa Melder [facility] 10/28/2016 10:29 AM

## 2016-11-15 DIAGNOSIS — M533 Sacrococcygeal disorders, not elsewhere classified: Secondary | ICD-10-CM | POA: Diagnosis not present

## 2016-11-18 ENCOUNTER — Encounter: Payer: Self-pay | Admitting: General Surgery

## 2016-11-18 ENCOUNTER — Ambulatory Visit (INDEPENDENT_AMBULATORY_CARE_PROVIDER_SITE_OTHER): Payer: PPO | Admitting: General Surgery

## 2016-11-18 VITALS — BP 154/84 | HR 72 | Resp 12 | Ht 61.0 in | Wt 178.0 lb

## 2016-11-18 DIAGNOSIS — C50412 Malignant neoplasm of upper-outer quadrant of left female breast: Secondary | ICD-10-CM

## 2016-11-18 DIAGNOSIS — Z170 Estrogen receptor positive status [ER+]: Secondary | ICD-10-CM

## 2016-11-18 MED ORDER — LETROZOLE 2.5 MG PO TABS: 2.5000 mg | ORAL_TABLET | Freq: Every day | ORAL | 3 refills | Status: DC

## 2016-11-18 NOTE — Progress Notes (Signed)
Patient ID: Lindsey [name], female   DOB: 1940-10-28, 76 y.o.   MRN: [id number]  Chief Complaint  Patient presents with  . Breast Cancer    HPI Lindsey [name] is a 76 y.o. female here today for her one year post left mastectomy for left breast cancer. Tolerating the letrozole. She denies any new issues. Cologuard was ordered but she has not completed testing as of yet. I have reviewed the history of present illness with the patient.  HPI  Past Medical History:  Diagnosis Date  . Arthritis   . Breast cancer ([place]) 1983   right breast cancer  . Breast cancer in female [facility]) 11-04-15   Left Breast INVASIVE MAMMARY CARCINOMA  T1c, no ER/PR positive Her 2 Negative  . Depression   . Diabetes mellitus without complication ([place])   . Hyperlipidemia   . Hypertension   . Hypothyroidism   . Osteoporosis   . Thyroid disease     Past Surgical History:  Procedure Laterality Date  . BREAST BIOPSY Left 11-04-15  . BREAST BIOPSY Left 10-10-07  . CATARACT EXTRACTION Bilateral 1990  . CHOLECYSTECTOMY    . MASTECTOMY Right 1983   Dr Myrle Sheng  . MASTECTOMY Right 1983  . MASTECTOMY W/ SENTINEL NODE BIOPSY Left 12/12/2015   Procedure: MASTECTOMY WITH SENTINEL LYMPH NODE BIOPSY;  Surgeon: Christene Lye, MD;  Location: [facility] ORS;  Service: General;  Laterality: Left;    Family History  Problem Relation Age of Onset  . Kidney disease Mother   . Heart disease Father   . Stroke Sister   . Cancer Sister     breast  . Breast cancer Sister   . Stroke Brother   . Diabetes Brother   . Hypertension Brother   . Cancer Daughter 36    DCIS/lumpectomy/genetic negative    Social History Social History  Substance Use Topics  . Smoking status: Never Smoker  . Smokeless tobacco: Never Used     Comment: no passive smoke in home  . Alcohol use No    No Known Allergies  Current Outpatient Prescriptions  Medication Sig Dispense Refill  . alendronate (FOSAMAX) 70 MG tablet Take 1 tablet (70 mg  total) by mouth once a week. Take with a full glass of water on an empty stomach. 52 tablet 0  . aspirin 81 MG tablet Take by mouth.    [name] atorvastatin (LIPITOR) 40 MG tablet Take 1 tablet (40 mg total) by mouth daily. 90 tablet 1  . cholecalciferol (VITAMIN D) 1000 UNITS tablet Take by mouth.    [name] ketoconazole (NIZORAL) 2 % cream APPLY TO THE SKIN TWICE A DAY TO INFRAMAMMARY AND GROIN AS DIRECTED  0  . letrozole (FEMARA) 2.5 MG tablet Take 1 tablet (2.5 mg total) by mouth daily. 90 tablet 3  . lisinopril (PRINIVIL,ZESTRIL) 20 MG tablet Take 1 tablet (20 mg total) by mouth daily. 90 tablet 1  . metFORMIN (GLUCOPHAGE) 1000 MG tablet TAKE 1 TABLET TWICE DAILY WITH A MEAL 180 tablet 0  . naproxen sodium (ALEVE) 220 MG tablet Take by mouth.    . sertraline (ZOLOFT) 100 MG tablet Take 1 tablet (100 mg total) by mouth daily. 90 tablet 0  . sitaGLIPtin (JANUVIA) 50 MG tablet Take 1 tablet (50 mg total) by mouth daily. 90 tablet 0  . SYNTHROID 112 MCG tablet take 1 tablet by mouth once daily 90 tablet 1  . vitamin B-12 (CYANOCOBALAMIN) 1000 MCG tablet Take 1,000 mcg by mouth  every morning.     No current facility-administered medications for this visit.     Review of Systems Review of Systems  Constitutional: Negative.   Respiratory: Negative.   Cardiovascular: Negative.     Blood pressure (!) 154/84, pulse 72, resp. rate 12, height 5\' 1"  (1.549 m), weight 178 lb (80.7 kg).  Physical Exam Physical Exam  Constitutional: She is oriented to person, place, and time. She appears well-developed and well-nourished.  HENT:  Mouth/Throat: Oropharynx is clear and moist.  Eyes: Conjunctivae are normal. No scleral icterus.  Neck: Neck supple.  Cardiovascular: Normal rate, regular rhythm and normal heart sounds.   Pulmonary/Chest: Effort normal and breath sounds normal.  Keloid over right mastectomy site, unchanged from previous assessment. Left mastectomy site is clean.  Abdominal: Soft. There is no  tenderness.  Lymphadenopathy:    She has no cervical adenopathy.    She has no axillary adenopathy.  Neurological: She is alert and oriented to person, place, and time.  Skin: Skin is warm and dry.  Psychiatric: Her behavior is normal.    Data Reviewed  Prior progress notes.  Assessment    Left breast cancer-stage 1, post mastectomy. Currently on Letrazole and doing well    Plan    Patient to get CA 27.29 and CEA done today.  Patient to follow up 6 months     This information has been scribed by Karie Fetch RN, BSN,BC. [name]    Kevron Patella G 11/19/2016, 2:41 PM

## 2016-11-18 NOTE — Patient Instructions (Signed)
The patient is aware to call back for any questions or concerns.  

## 2016-11-19 LAB — CANCER ANTIGEN 27.29: CA 27.29: 21.3 U/mL (ref 0.0–38.6)

## 2016-11-19 LAB — CEA: CEA: 2.4 ng/mL (ref 0.0–4.7)

## 2016-11-23 ENCOUNTER — Telehealth: Payer: Self-pay | Admitting: *Deleted

## 2016-11-23 NOTE — Telephone Encounter (Signed)
Notified patient as instructed, patient pleased. Discussed follow-up appointments, patient agrees  

## 2016-11-23 NOTE — Telephone Encounter (Signed)
-----   Message from Christene Lye, MD sent at 11/19/2016  7:44 AM EDT ----- Normal values. Please inform pt.

## 2016-11-30 ENCOUNTER — Encounter (INDEPENDENT_AMBULATORY_CARE_PROVIDER_SITE_OTHER): Payer: PPO | Admitting: Family Medicine

## 2016-12-03 ENCOUNTER — Other Ambulatory Visit: Payer: Self-pay | Admitting: Family Medicine

## 2016-12-03 DIAGNOSIS — E039 Hypothyroidism, unspecified: Secondary | ICD-10-CM

## 2016-12-06 DIAGNOSIS — M533 Sacrococcygeal disorders, not elsewhere classified: Secondary | ICD-10-CM | POA: Diagnosis not present

## 2016-12-08 IMAGING — US US EXTREM LOW VENOUS BILAT
1 series · 13 of 24 positions shown · non-contrast
Comparison: None.

CLINICAL DATA: 75-year-old female with a history of edema



[Series 1: us extrem low venous bilat · 0.08mm/px · 13 of 67 slices shown]
[im 1/67]
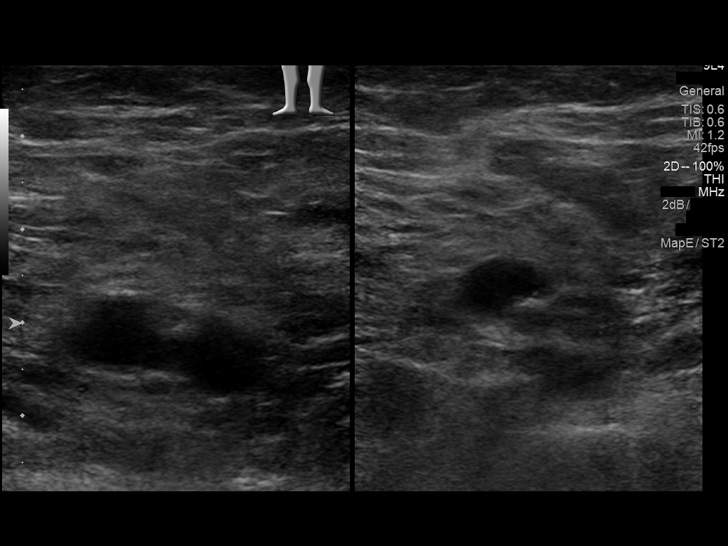
[im 6/67]
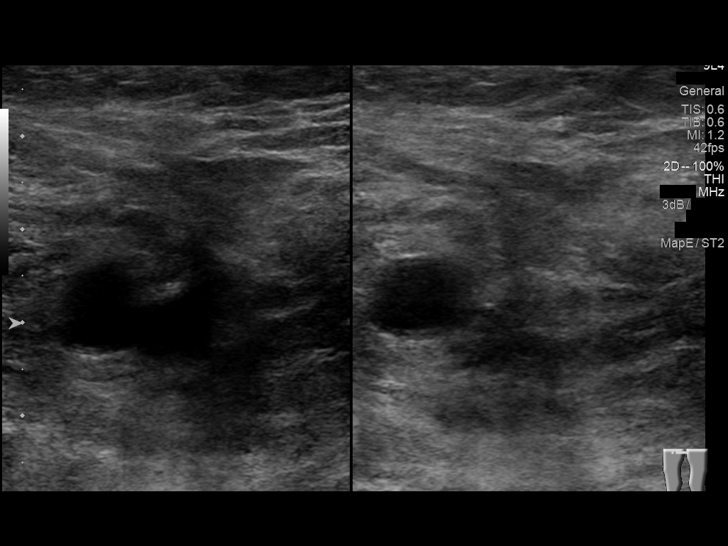
[im 12/67]
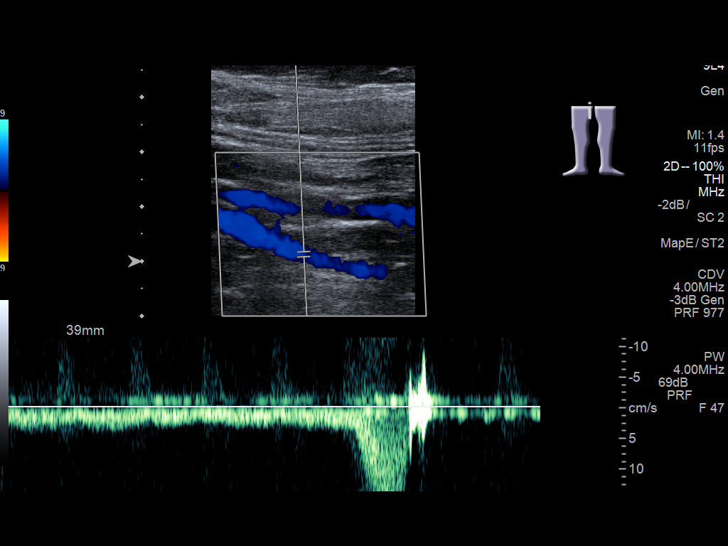
[im 18/67]
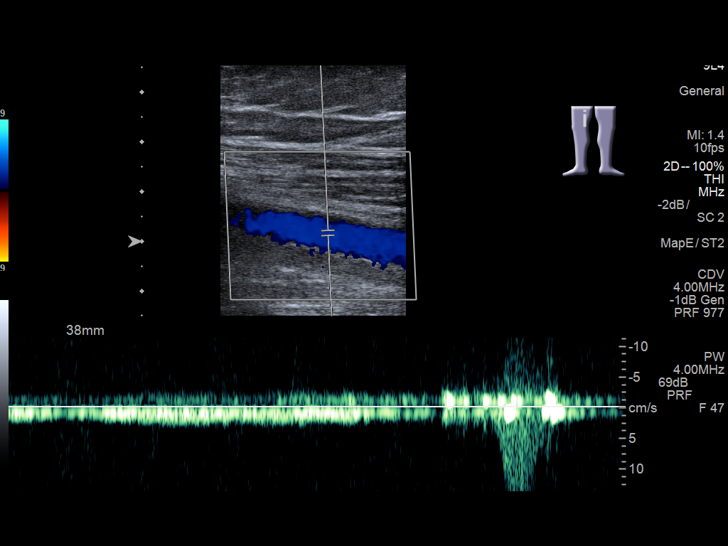
[im 23/67]
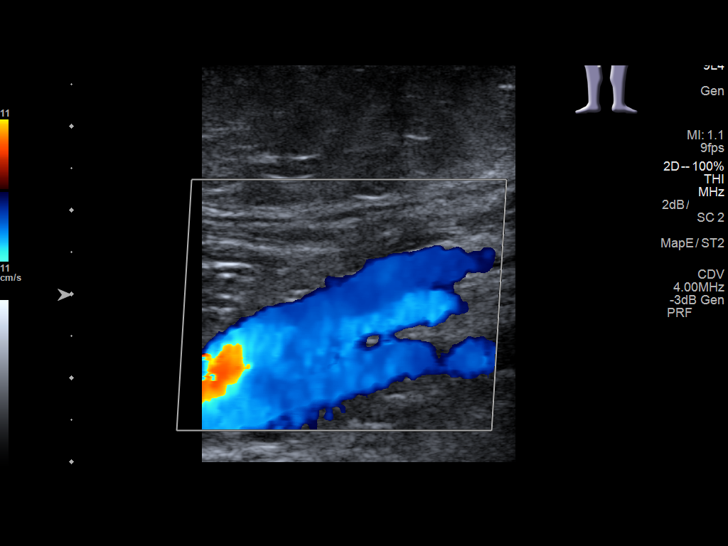
[im 29/67]
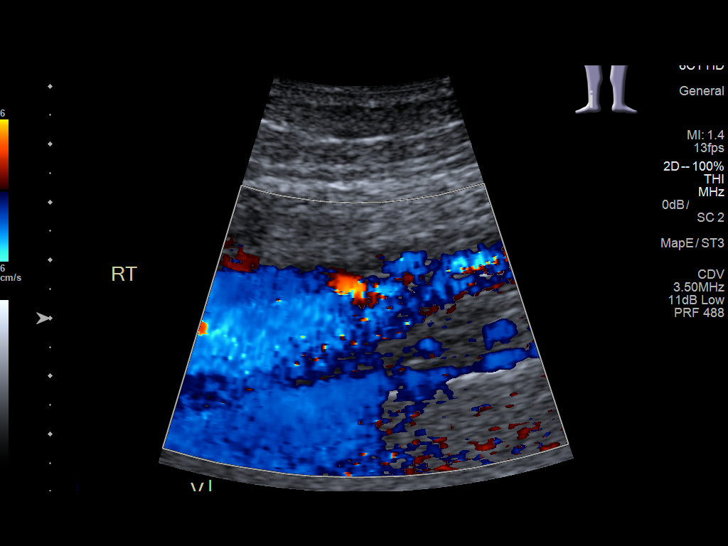
[im 35/67]
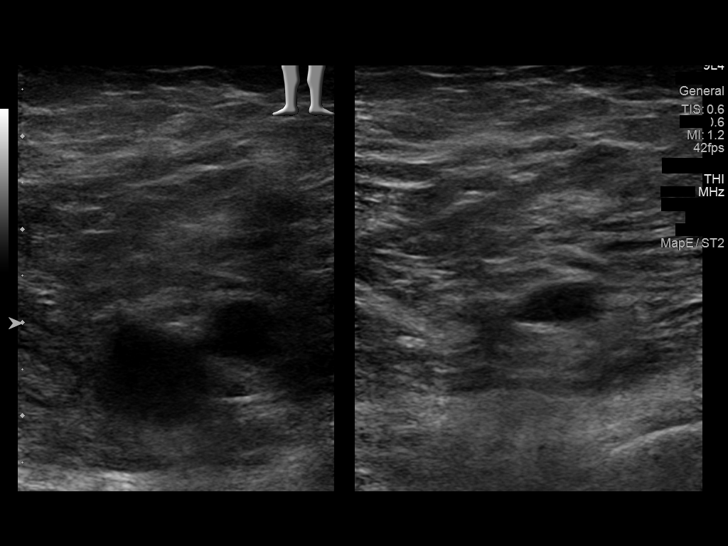
[im 38/67]
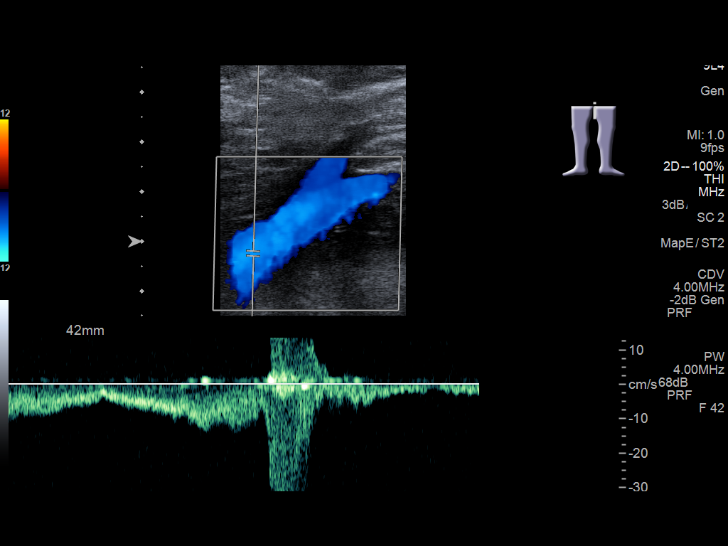
[im 44/67]
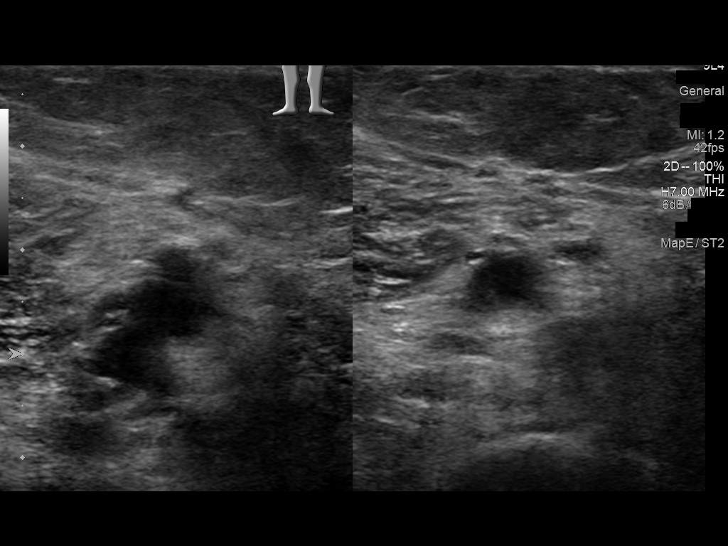
[im 49/67]
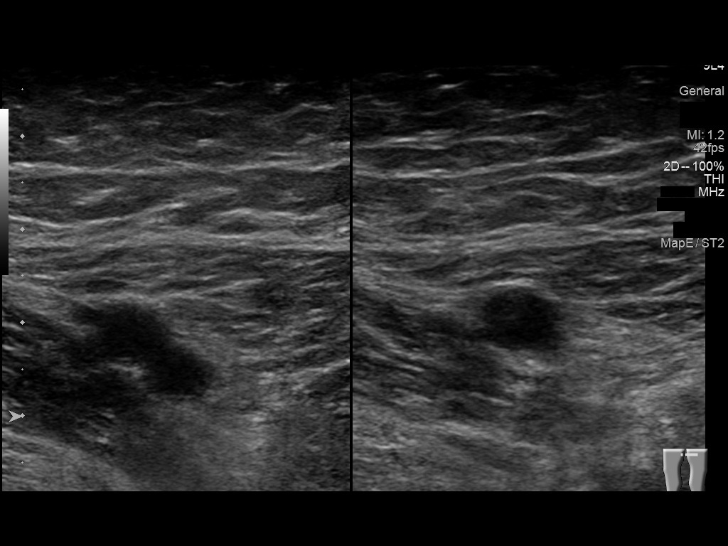
[im 55/67]
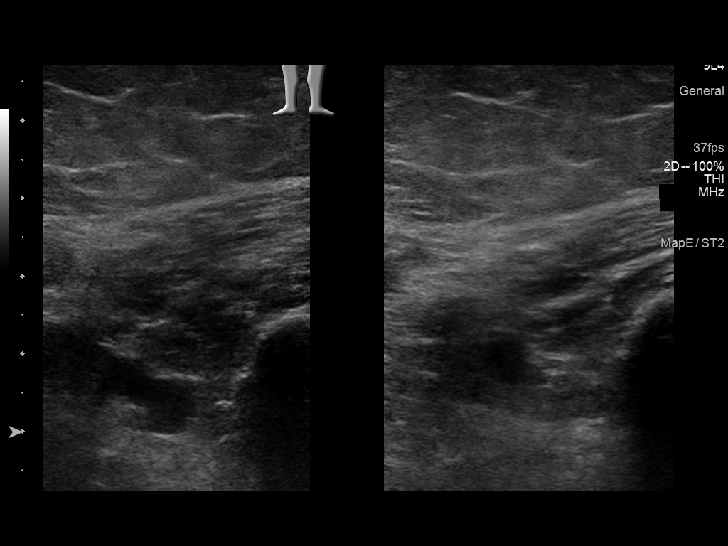
[im 61/67]
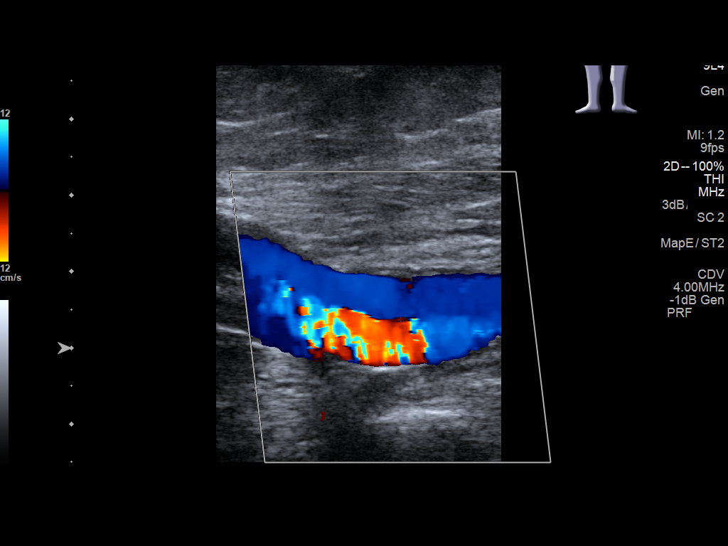
[im 67/67]
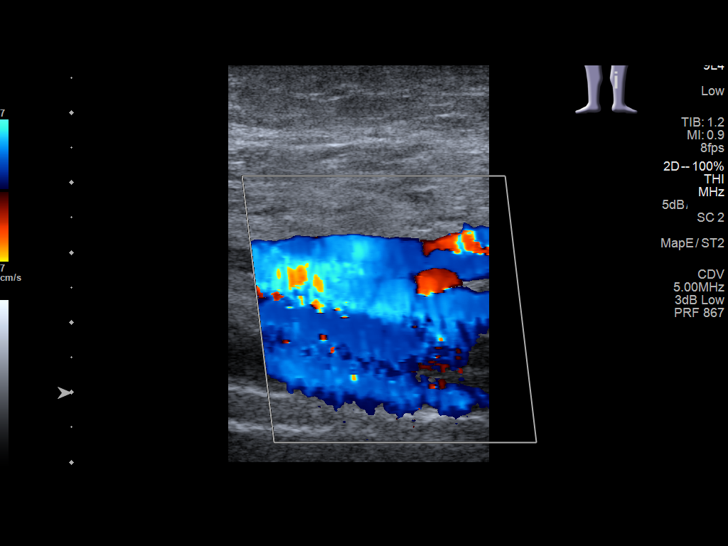

[13 of 24 positions shown; findings below may reference images not displayed]

FINDINGS: RIGHT LOWER EXTREMITY

Common Femoral Vein: No evidence of thrombus. Normal
compressibility, respiratory phasicity and response to augmentation.

Saphenofemoral Junction: No evidence of thrombus. Normal
compressibility and flow on color Doppler imaging.

Profunda Femoral Vein: No evidence of thrombus. Normal
compressibility and flow on color Doppler imaging.

Femoral Vein: No evidence of thrombus. Normal compressibility,
respiratory phasicity and response to augmentation.

Popliteal Vein: No evidence of thrombus. Normal compressibility,
respiratory phasicity and response to augmentation.

Calf Veins: No evidence of thrombus. Normal compressibility and flow
on color Doppler imaging.

Superficial Great Saphenous Vein: No evidence of thrombus. Normal
compressibility and flow on color Doppler imaging.

Other Findings:  None.

LEFT LOWER EXTREMITY

Common Femoral Vein: No evidence of thrombus. Normal
compressibility, respiratory phasicity and response to augmentation.

Saphenofemoral Junction: No evidence of thrombus. Normal
compressibility and flow on color Doppler imaging.

Profunda Femoral Vein: No evidence of thrombus. Normal
compressibility and flow on color Doppler imaging.

Femoral Vein: No evidence of thrombus. Normal compressibility,
respiratory phasicity and response to augmentation.

Popliteal Vein: No evidence of thrombus. Normal compressibility,
respiratory phasicity and response to augmentation.

Calf Veins: No evidence of thrombus. Normal compressibility and flow
on color Doppler imaging.

Superficial Great Saphenous Vein: No evidence of thrombus. Normal
compressibility and flow on color Doppler imaging.

Other Findings:  None.
IMPRESSION: Sonographic survey of the bilateral lower extremities negative for
DVT.

## 2017-01-27 ENCOUNTER — Ambulatory Visit (INDEPENDENT_AMBULATORY_CARE_PROVIDER_SITE_OTHER): Payer: PPO | Admitting: Family Medicine

## 2017-01-27 ENCOUNTER — Encounter: Payer: Self-pay | Admitting: Family Medicine

## 2017-01-27 VITALS — BP 138/78 | HR 79 | Temp 97.9°F | Resp 16 | Ht 61.0 in | Wt 177.2 lb

## 2017-01-27 DIAGNOSIS — I10 Essential (primary) hypertension: Secondary | ICD-10-CM | POA: Diagnosis not present

## 2017-01-27 DIAGNOSIS — E7800 Pure hypercholesterolemia, unspecified: Secondary | ICD-10-CM | POA: Diagnosis not present

## 2017-01-27 DIAGNOSIS — F331 Major depressive disorder, recurrent, moderate: Secondary | ICD-10-CM

## 2017-01-27 DIAGNOSIS — E119 Type 2 diabetes mellitus without complications: Secondary | ICD-10-CM

## 2017-01-27 DIAGNOSIS — E039 Hypothyroidism, unspecified: Secondary | ICD-10-CM

## 2017-01-27 LAB — LIPID PANEL
CHOL/HDL RATIO: 2.7 ratio (ref <5.0)
Cholesterol: 104 mg/dL (ref <200)
HDL: 38 mg/dL — AB (ref >50)
LDL Cholesterol: 50 mg/dL (ref <100)
Triglycerides: 82 mg/dL (ref <150)
VLDL: 16 mg/dL (ref <30)

## 2017-01-27 LAB — POCT GLYCOSYLATED HEMOGLOBIN (HGB A1C): Hemoglobin A1C: 5.8

## 2017-01-27 MED ORDER — SITAGLIPTIN PHOSPHATE 50 MG PO TABS: 50.0000 mg | ORAL_TABLET | Freq: Every day | ORAL | 0 refills | Status: DC

## 2017-01-27 MED ORDER — ATORVASTATIN CALCIUM 40 MG PO TABS: 40.0000 mg | ORAL_TABLET | Freq: Every day | ORAL | 1 refills | Status: DC

## 2017-01-27 MED ORDER — SYNTHROID 112 MCG PO TABS: 112.0000 ug | ORAL_TABLET | Freq: Every day | ORAL | 1 refills | Status: DC

## 2017-01-27 MED ORDER — SERTRALINE HCL 100 MG PO TABS: 100.0000 mg | ORAL_TABLET | Freq: Every day | ORAL | 0 refills | Status: DC

## 2017-01-27 MED ORDER — LISINOPRIL 20 MG PO TABS: 20.0000 mg | ORAL_TABLET | Freq: Every day | ORAL | 1 refills | Status: DC

## 2017-01-27 MED ORDER — METFORMIN HCL 500 MG PO TABS: 500.0000 mg | ORAL_TABLET | Freq: Two times a day (BID) | ORAL | 0 refills | Status: DC

## 2017-01-27 NOTE — Progress Notes (Signed)
Name: Lindsey [name]   MRN: [id number]    DOB: Mar 08, 1941   Date:01/27/2017       Progress Note  Subjective  Chief Complaint  Chief Complaint  Patient presents with  . Diabetes    3 month follow up pt not checking blood sugars  . Hyperlipidemia  . Depression  . Hypertension  . Medication Refill    Diabetes  She presents for her follow-up diabetic visit. She has type 2 diabetes mellitus. Her disease course has been stable. Hypoglycemia symptoms include nervousness/anxiousness. Pertinent negatives for hypoglycemia include no headaches. Pertinent negatives for diabetes include no blurred vision, no chest pain, no fatigue, no polydipsia and no polyuria. There are no hypoglycemic complications. Pertinent negatives for diabetic complications include no CVA, heart disease or peripheral neuropathy. Current diabetic treatment includes oral agent (dual therapy). She is following a diabetic and generally healthy diet. Frequency home blood tests: does not check her blood glucose. An ACE inhibitor/angiotensin II receptor blocker is being taken. Eye exam is current.  Hyperlipidemia  This is a chronic problem. The problem is controlled. Recent lipid tests were reviewed and are normal. Pertinent negatives include no chest pain, leg pain, myalgias or shortness of breath. Current antihyperlipidemic treatment includes statins.  Depression         This is a chronic problem.The problem is unchanged.  Associated symptoms include sad.  Associated symptoms include no fatigue, no helplessness, no hopelessness, no myalgias and no headaches.  Past treatments include SSRIs - Selective serotonin reuptake inhibitors.  Past medical history includes thyroid problem.   Hypertension  This is a chronic problem. The problem is unchanged. The problem is controlled. Pertinent negatives include no blurred vision, chest pain, headaches, orthopnea, palpitations or shortness of breath. Past treatments include ACE inhibitors. There is no  history of kidney disease, CAD/MI or CVA. Identifiable causes of hypertension include a thyroid problem.  Thyroid Problem  Presents for follow-up visit. Symptoms include anxiety. Patient reports no cold intolerance, constipation, depressed mood, dry skin, fatigue or palpitations. The symptoms have been stable. Her past medical history is significant for hyperlipidemia.    Past Medical History:  Diagnosis Date  . Arthritis   . Breast cancer ([place]) 1983   right breast cancer  . 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 Depression   . Diabetes mellitus without complication ([place])   . Hyperlipidemia   . Hypertension   . Hypothyroidism   . Osteoporosis   . Thyroid disease     Past Surgical History:  Procedure Laterality Date  . BREAST BIOPSY Left 11-04-15  . BREAST BIOPSY Left 10-10-07  . CATARACT EXTRACTION Bilateral 1990  . CHOLECYSTECTOMY    . MASTECTOMY Right 1983   Dr Myrle Sheng  . MASTECTOMY Right 1983  . MASTECTOMY W/ SENTINEL NODE BIOPSY Left 12/12/2015   Procedure: MASTECTOMY WITH SENTINEL LYMPH NODE BIOPSY;  Surgeon: Christene Lye, MD;  Location: [facility] ORS;  Service: General;  Laterality: Left;    Family History  Problem Relation Age of Onset  . Kidney disease Mother   . Heart disease Father   . Stroke Sister   . Cancer Sister        breast  . Breast cancer Sister   . Stroke Brother   . Diabetes Brother   . Hypertension Brother   . Cancer Daughter 50       DCIS/lumpectomy/genetic negative  Social History   Social History  . Marital status: Married    Spouse name: N/A  . Number of children: N/A  . Years of education: N/A   Occupational History  . Not on file.   Social History Main Topics  . Smoking status: Never Smoker  . Smokeless tobacco: Never Used     Comment: no passive smoke in home  . Alcohol use No  . Drug use: No  . Sexual activity: No   Other Topics  Concern  . Not on file   Social History Narrative  . No narrative on file     Current Outpatient Prescriptions:  .  alendronate (FOSAMAX) 70 MG tablet, Take 1 tablet (70 mg total) by mouth once a week. Take with a full glass of water on an empty stomach., Disp: 52 tablet, Rfl: 0 .  aspirin 81 MG tablet, Take by mouth., Disp: , Rfl:  .  atorvastatin (LIPITOR) 40 MG tablet, Take 1 tablet (40 mg total) by mouth daily., Disp: 90 tablet, Rfl: 1 .  cholecalciferol (VITAMIN D) 1000 UNITS tablet, Take by mouth., Disp: , Rfl:  .  ketoconazole (NIZORAL) 2 % cream, APPLY TO THE SKIN TWICE A DAY TO INFRAMAMMARY AND GROIN AS DIRECTED, Disp: , Rfl: 0 .  letrozole (FEMARA) 2.5 MG tablet, Take 1 tablet (2.5 mg total) by mouth daily., Disp: 90 tablet, Rfl: 3 .  lisinopril (PRINIVIL,ZESTRIL) 20 MG tablet, Take 1 tablet (20 mg total) by mouth daily., Disp: 90 tablet, Rfl: 1 .  metFORMIN (GLUCOPHAGE) 1000 MG tablet, TAKE 1 TABLET TWICE DAILY WITH A MEAL, Disp: 180 tablet, Rfl: 0 .  naproxen sodium (ALEVE) 220 MG tablet, Take by mouth., Disp: , Rfl:  .  sertraline (ZOLOFT) 100 MG tablet, Take 1 tablet (100 mg total) by mouth daily., Disp: 90 tablet, Rfl: 0 .  sitaGLIPtin (JANUVIA) 50 MG tablet, Take 1 tablet (50 mg total) by mouth daily., Disp: 90 tablet, Rfl: 0 .  SYNTHROID 112 MCG tablet, take 1 tablet by mouth once daily, Disp: 90 tablet, Rfl: 1 .  vitamin B-12 (CYANOCOBALAMIN) 1000 MCG tablet, Take 1,000 mcg by mouth every morning., Disp: , Rfl:   No Known Allergies   Review of Systems  Constitutional: Negative for fatigue.  Eyes: Negative for blurred vision.  Respiratory: Negative for shortness of breath.   Cardiovascular: Negative for chest pain, palpitations and orthopnea.  Gastrointestinal: Negative for constipation.  Musculoskeletal: Negative for myalgias.  Neurological: Negative for headaches.  Endo/Heme/Allergies: Negative for cold intolerance and polydipsia.  Psychiatric/Behavioral:  Positive for depression. The patient is nervous/anxious.      Objective  Vitals:   01/27/17 1044  BP: 138/78  Pulse: 79  Resp: 16  Temp: 97.9 F (36.6 C)  SpO2: 98%  Weight: 177 lb 3 oz (80.4 kg)  Height: 5\' 1"  (1.549 m)    Physical Exam  Constitutional: She is oriented to person, place, and time and well-developed, well-nourished, and in no distress.  HENT:  Head: Normocephalic and atraumatic.  Neck: Neck supple. No thyromegaly present.  Cardiovascular: Normal rate, regular rhythm and normal heart sounds.   No murmur heard. Pulmonary/Chest: Effort normal and breath sounds normal. She has no wheezes.  Abdominal: Soft. Bowel sounds are normal. There is no tenderness.  Musculoskeletal: She exhibits edema.  Neurological: She is alert and oriented to person, place, and time.  Psychiatric: Mood, memory, affect and judgment normal.  Nursing note and vitals reviewed.     Assessment & Plan  1. Controlled type 2 diabetes mellitus without complication, without long-term current use of insulin (HCC)   A1c is 5.8%, well-controlled diabetes, advised to drop metformin 500 mg twice a day, check blood glucose regularly. - POCT HgB A1C - sitaGLIPtin (JANUVIA) 50 MG tablet; Take 1 tablet (50 mg total) by mouth daily.  Dispense: 90 tablet; Refill: 0 - metFORMIN (GLUCOPHAGE) 500 MG tablet; Take 1 tablet (500 mg total) by mouth 2 (two) times daily with a meal. TAKE 1 TABLET TWICE DAILY WITH A MEAL  Dispense: 180 tablet; Refill: 0  2. Benign hypertension Blood pressure stable on present antihypertensive therapy - lisinopril (PRINIVIL,ZESTRIL) 20 MG tablet; Take 1 tablet (20 mg total) by mouth daily.  Dispense: 90 tablet; Refill: 1  3. Moderate episode of recurrent major depressive disorder (HCC)  - sertraline (ZOLOFT) 100 MG tablet; Take 1 tablet (100 mg total) by mouth daily.  Dispense: 90 tablet; Refill: 0  4. Hypothyroidism, unspecified type  - SYNTHROID 112 MCG tablet; Take 1  tablet (112 mcg total) by mouth daily.  Dispense: 90 tablet; Refill: 1 - TSH  5. Pure hypercholesterolemia  - atorvastatin (LIPITOR) 40 MG tablet; Take 1 tablet (40 mg total) by mouth daily.  Dispense: 90 tablet; Refill: 1 - Lipid panel   Bonetta Mostek [facility] 01/27/2017 11:07 AM

## 2017-01-28 ENCOUNTER — Ambulatory Visit: Payer: PPO | Admitting: Family Medicine

## 2017-01-28 LAB — TSH: TSH: 0.7 mIU/L

## 2017-04-01 DIAGNOSIS — M722 Plantar fascial fibromatosis: Secondary | ICD-10-CM | POA: Diagnosis not present

## 2017-04-12 DIAGNOSIS — Z961 Presence of intraocular lens: Secondary | ICD-10-CM | POA: Diagnosis not present

## 2017-04-29 ENCOUNTER — Ambulatory Visit (INDEPENDENT_AMBULATORY_CARE_PROVIDER_SITE_OTHER): Payer: PPO | Admitting: Family Medicine

## 2017-04-29 ENCOUNTER — Encounter: Payer: Self-pay | Admitting: Family Medicine

## 2017-04-29 VITALS — BP 135/78 | HR 74 | Temp 98.2°F | Resp 16 | Ht 61.0 in | Wt 176.1 lb

## 2017-04-29 DIAGNOSIS — Z23 Encounter for immunization: Secondary | ICD-10-CM

## 2017-04-29 DIAGNOSIS — E7800 Pure hypercholesterolemia, unspecified: Secondary | ICD-10-CM

## 2017-04-29 DIAGNOSIS — I10 Essential (primary) hypertension: Secondary | ICD-10-CM

## 2017-04-29 DIAGNOSIS — E039 Hypothyroidism, unspecified: Secondary | ICD-10-CM

## 2017-04-29 DIAGNOSIS — E119 Type 2 diabetes mellitus without complications: Secondary | ICD-10-CM

## 2017-04-29 LAB — GLUCOSE, POCT (MANUAL RESULT ENTRY): POC Glucose: 120 mg/dl — AB (ref 70–99)

## 2017-04-29 LAB — POCT GLYCOSYLATED HEMOGLOBIN (HGB A1C): Hemoglobin A1C: 5.8

## 2017-04-29 MED ORDER — SYNTHROID 112 MCG PO TABS: 112.0000 ug | ORAL_TABLET | Freq: Every day | ORAL | 1 refills | Status: DC

## 2017-04-29 MED ORDER — SITAGLIPTIN PHOSPHATE 50 MG PO TABS: 50.0000 mg | ORAL_TABLET | Freq: Every day | ORAL | 0 refills | Status: DC

## 2017-04-29 MED ORDER — ATORVASTATIN CALCIUM 40 MG PO TABS: 40.0000 mg | ORAL_TABLET | Freq: Every day | ORAL | 1 refills | Status: DC

## 2017-04-29 MED ORDER — METFORMIN HCL 500 MG PO TABS: 500.0000 mg | ORAL_TABLET | Freq: Two times a day (BID) | ORAL | 0 refills | Status: DC

## 2017-04-29 MED ORDER — LISINOPRIL 20 MG PO TABS: 20.0000 mg | ORAL_TABLET | Freq: Every day | ORAL | 1 refills | Status: DC

## 2017-04-29 NOTE — Progress Notes (Signed)
Name: Lindsey [name]   MRN: [id number]    DOB: Jun 28, 1941   Date:04/29/2017       Progress Note  Subjective  Chief Complaint  Chief Complaint  Patient presents with  . Follow-up    3 mo  . Medication Refill  . Diabetes  . Hyperlipidemia  . Hypothyroidism  . Hypertension    Diabetes  She presents for her follow-up diabetic visit. She has type 2 diabetes mellitus. Her disease course has been stable. Hypoglycemia symptoms include nervousness/anxiousness. Pertinent negatives for hypoglycemia include no headaches. Associated symptoms include blurred vision (occasionally has blurred vision.). Pertinent negatives for diabetes include no chest pain, no fatigue, no foot paresthesias, no polydipsia and no polyuria. There are no hypoglycemic complications. Pertinent negatives for diabetic complications include no CVA, heart disease or peripheral neuropathy. Current diabetic treatment includes oral agent (dual therapy). She is following a diabetic and generally healthy diet. Frequency home blood tests: does not check her blood glucose. An ACE inhibitor/angiotensin II receptor blocker is being taken. Eye exam is current.  Hyperlipidemia  This is a chronic problem. The problem is controlled. Recent lipid tests were reviewed and are normal. Pertinent negatives include no chest pain, leg pain, myalgias or shortness of breath. Current antihyperlipidemic treatment includes statins.  Hypertension  This is a chronic problem. The problem is unchanged. The problem is controlled. Associated symptoms include blurred vision (occasionally has blurred vision.). Pertinent negatives include no chest pain, headaches, orthopnea, palpitations or shortness of breath. Past treatments include ACE inhibitors. There is no history of kidney disease, CAD/MI or CVA.  Thyroid Problem  Presents for follow-up visit. Symptoms include anxiety. Patient reports no cold intolerance, constipation, depressed mood, dry skin, fatigue or  palpitations. The symptoms have been stable. Her past medical history is significant for hyperlipidemia.     Past Medical History:  Diagnosis Date  . Arthritis   . Breast cancer ([place]) 1983   right breast cancer  . Breast cancer in female [facility]) 11-04-15   Left Breast INVASIVE MAMMARY CARCINOMA  T1c, no ER/PR positive Her 2 Negative  . Depression   . Diabetes mellitus without complication ([place])   . Hyperlipidemia   . Hypertension   . Hypothyroidism   . Osteoporosis   . Thyroid disease     Past Surgical History:  Procedure Laterality Date  . BREAST BIOPSY Left 11-04-15  . BREAST BIOPSY Left 10-10-07  . CATARACT EXTRACTION Bilateral 1990  . CHOLECYSTECTOMY    . MASTECTOMY Right 1983   Dr Myrle Sheng  . MASTECTOMY Right 1983  . MASTECTOMY W/ SENTINEL NODE BIOPSY Left 12/12/2015   Procedure: MASTECTOMY WITH SENTINEL LYMPH NODE BIOPSY;  Surgeon: Christene Lye, MD;  Location: [facility] ORS;  Service: General;  Laterality: Left;    Family History  Problem Relation Age of Onset  . Kidney disease Mother   . Heart disease Father   . Stroke Sister   . Cancer Sister        breast  . Breast cancer Sister   . Stroke Brother   . Diabetes Brother   . Hypertension Brother   . Cancer Daughter 22       DCIS/lumpectomy/genetic negative    Social History   Social History  . Marital status: Married    Spouse name: N/A  . Number of children: N/A  . Years of education: N/A   Occupational History  . Not on file.   Social History Main Topics  . Smoking status: Never Smoker  .  Smokeless tobacco: Never Used     Comment: no passive smoke in home  . Alcohol use No  . Drug use: No  . Sexual activity: No   Other Topics Concern  . Not on file   Social History Narrative  . No narrative on file     Current Outpatient Prescriptions:  .  alendronate (FOSAMAX) 70 MG tablet, Take 1 tablet (70 mg total) by mouth once a week. Take with a full glass of water on an empty stomach., Disp:  52 tablet, Rfl: 0 .  aspirin 81 MG tablet, Take by mouth., Disp: , Rfl:  .  atorvastatin (LIPITOR) 40 MG tablet, Take 1 tablet (40 mg total) by mouth daily., Disp: 90 tablet, Rfl: 1 .  cholecalciferol (VITAMIN D) 1000 UNITS tablet, Take by mouth., Disp: , Rfl:  .  ketoconazole (NIZORAL) 2 % cream, APPLY TO THE SKIN TWICE A DAY TO INFRAMAMMARY AND GROIN AS DIRECTED, Disp: , Rfl: 0 .  letrozole (FEMARA) 2.5 MG tablet, Take 1 tablet (2.5 mg total) by mouth daily., Disp: 90 tablet, Rfl: 3 .  lisinopril (PRINIVIL,ZESTRIL) 20 MG tablet, Take 1 tablet (20 mg total) by mouth daily., Disp: 90 tablet, Rfl: 1 .  naproxen sodium (ALEVE) 220 MG tablet, Take by mouth., Disp: , Rfl:  .  sertraline (ZOLOFT) 100 MG tablet, Take 1 tablet (100 mg total) by mouth daily., Disp: 90 tablet, Rfl: 0 .  sitaGLIPtin (JANUVIA) 50 MG tablet, Take 1 tablet (50 mg total) by mouth daily., Disp: 90 tablet, Rfl: 0 .  SYNTHROID 112 MCG tablet, Take 1 tablet (112 mcg total) by mouth daily., Disp: 90 tablet, Rfl: 1 .  vitamin B-12 (CYANOCOBALAMIN) 1000 MCG tablet, Take 1,000 mcg by mouth every morning., Disp: , Rfl:  .  metFORMIN (GLUCOPHAGE) 500 MG tablet, Take 1 tablet (500 mg total) by mouth 2 (two) times daily with a meal. TAKE 1 TABLET TWICE DAILY WITH A MEAL, Disp: 180 tablet, Rfl: 0 .  ofloxacin (OCUFLOX) 0.3 % ophthalmic solution, instill 1 drop into both eyes four times a day for 7 days, Disp: , Rfl: 0  No Known Allergies   Review of Systems  Constitutional: Negative for fatigue.  Eyes: Positive for blurred vision (occasionally has blurred vision.).  Respiratory: Negative for shortness of breath.   Cardiovascular: Negative for chest pain, palpitations and orthopnea.  Gastrointestinal: Negative for constipation.  Musculoskeletal: Negative for myalgias.  Neurological: Negative for headaches.  Endo/Heme/Allergies: Negative for cold intolerance and polydipsia.  Psychiatric/Behavioral: The patient is nervous/anxious.      Objective  Vitals:   04/29/17 0951  BP: 135/78  Pulse: 74  Resp: 16  Temp: 98.2 F (36.8 C)  TempSrc: Oral  SpO2: 98%  Weight: 176 lb 1.6 oz (79.9 kg)  Height: 5\' 1"  (1.549 m)    Physical Exam  Constitutional: She is oriented to person, place, and time and well-developed, well-nourished, and in no distress.  HENT:  Head: Normocephalic and atraumatic.  Cardiovascular: Normal rate, regular rhythm and normal heart sounds.   No murmur heard. Pulmonary/Chest: Effort normal and breath sounds normal. She has no wheezes.  Neurological: She is alert and oriented to person, place, and time.  Psychiatric: Mood, memory, affect and judgment normal.  Nursing note and vitals reviewed.      Recent Results (from the past 2160 hour(s))  POCT Glucose (CBG)     Status: Abnormal   Collection Time: 04/29/17  9:57 AM  Result Value Ref Range   POC  Glucose 120 (A) 70 - 99 mg/dl  POCT HgB A1C     Status: Normal   Collection Time: 04/29/17  9:58 AM  Result Value Ref Range   Hemoglobin A1C 5.8      Assessment & Plan  1. Controlled type 2 diabetes mellitus without complication, without long-term current use of insulin (HCC)   A1c is 5.8%, well-controlled diabetes - POCT HgB A1C - POCT Glucose (CBG) - metFORMIN (GLUCOPHAGE) 500 MG tablet; Take 1 tablet (500 mg total) by mouth 2 (two) times daily with a meal. TAKE 1 TABLET TWICE DAILY WITH A MEAL  Dispense: 180 tablet; Refill: 0 - sitaGLIPtin (JANUVIA) 50 MG tablet; Take 1 tablet (50 mg total) by mouth daily.  Dispense: 90 tablet; Refill: 0  2. Benign hypertension  - lisinopril (PRINIVIL,ZESTRIL) 20 MG tablet; Take 1 tablet (20 mg total) by mouth daily.  Dispense: 90 tablet; Refill: 1  3. Hypothyroidism, unspecified type  - SYNTHROID 112 MCG tablet; Take 1 tablet (112 mcg total) by mouth daily.  Dispense: 90 tablet; Refill: 1  4. Pure hypercholesterolemia  - atorvastatin (LIPITOR) 40 MG tablet; Take 1 tablet (40 mg total) by  mouth daily.  Dispense: 90 tablet; Refill: 1  5. Need for influenza vaccination  - Flu vaccine HIGH DOSE PF (Fluzone High dose)   Laneice Meneely [facility] [employer] [facility] 04/29/2017 10:01 AM

## 2017-04-29 NOTE — Progress Notes (Deleted)
Name: Lindsey [name]   MRN: [id number]    DOB: [birth year]   Date:04/29/2017       Progress Note  Subjective  Chief Complaint  Chief Complaint  Patient presents with  . Follow-up    3 mo  . Medication Refill  . Diabetes  . Hyperlipidemia  . Hypothyroidism  . Hypertension    HPI  ***  Past Medical History:  Diagnosis Date  . Arthritis   . Breast cancer ([place]) 1983   right breast cancer  . Breast cancer in female [facility]) 11-04-15   Left Breast INVASIVE MAMMARY CARCINOMA  T1c, no ER/PR positive Her 2 Negative  . Depression   . Diabetes mellitus without complication ([place])   . Hyperlipidemia   . Hypertension   . Hypothyroidism   . Osteoporosis   . Thyroid disease     Past Surgical History:  Procedure Laterality Date  . BREAST BIOPSY Left 11-04-15  . BREAST BIOPSY Left 10-10-07  . CATARACT EXTRACTION Bilateral 1990  . CHOLECYSTECTOMY    . MASTECTOMY Right 1983   Dr Myrle Sheng  . MASTECTOMY Right 1983  . MASTECTOMY W/ SENTINEL NODE BIOPSY Left 12/12/2015   Procedure: MASTECTOMY WITH SENTINEL LYMPH NODE BIOPSY;  Surgeon: Christene Lye, MD;  Location: [facility] ORS;  Service: General;  Laterality: Left;    Family History  Problem Relation Age of Onset  . Kidney disease Mother   . Heart disease Father   . Stroke Sister   . Cancer Sister        breast  . Breast cancer Sister   . Stroke Brother   . Diabetes Brother   . Hypertension Brother   . Cancer Daughter 91       DCIS/lumpectomy/genetic negative    Social History   Social History  . Marital status: Married    Spouse name: N/A  . Number of children: N/A  . Years of education: N/A   Occupational History  . Not on file.   Social History Main Topics  . Smoking status: Never Smoker  . Smokeless tobacco: Never Used     Comment: no passive smoke in home  . Alcohol use No  . Drug use: No  . Sexual activity: No   Other Topics Concern  . Not on file   Social History Narrative  . No narrative on file      Current Outpatient Prescriptions:  .  alendronate (FOSAMAX) 70 MG tablet, Take 1 tablet (70 mg total) by mouth once a week. Take with a full glass of water on an empty stomach., Disp: 52 tablet, Rfl: 0 .  aspirin 81 MG tablet, Take by mouth., Disp: , Rfl:  .  atorvastatin (LIPITOR) 40 MG tablet, Take 1 tablet (40 mg total) by mouth daily., Disp: 90 tablet, Rfl: 1 .  cholecalciferol (VITAMIN D) 1000 UNITS tablet, Take by mouth., Disp: , Rfl:  .  ketoconazole (NIZORAL) 2 % cream, APPLY TO THE SKIN TWICE A DAY TO INFRAMAMMARY AND GROIN AS DIRECTED, Disp: , Rfl: 0 .  letrozole (FEMARA) 2.5 MG tablet, Take 1 tablet (2.5 mg total) by mouth daily., Disp: 90 tablet, Rfl: 3 .  lisinopril (PRINIVIL,ZESTRIL) 20 MG tablet, Take 1 tablet (20 mg total) by mouth daily., Disp: 90 tablet, Rfl: 1 .  naproxen sodium (ALEVE) 220 MG tablet, Take by mouth., Disp: , Rfl:  .  sertraline (ZOLOFT) 100 MG tablet, Take 1 tablet (100 mg total) by mouth daily., Disp: 90 tablet, Rfl: 0 .  sitaGLIPtin (JANUVIA) 50 MG tablet, Take 1 tablet (50 mg total) by mouth daily., Disp: 90 tablet, Rfl: 0 .  SYNTHROID 112 MCG tablet, Take 1 tablet (112 mcg total) by mouth daily., Disp: 90 tablet, Rfl: 1 .  vitamin B-12 (CYANOCOBALAMIN) 1000 MCG tablet, Take 1,000 mcg by mouth every morning., Disp: , Rfl:  .  metFORMIN (GLUCOPHAGE) 500 MG tablet, Take 1 tablet (500 mg total) by mouth 2 (two) times daily with a meal. TAKE 1 TABLET TWICE DAILY WITH A MEAL, Disp: 180 tablet, Rfl: 0 .  ofloxacin (OCUFLOX) 0.3 % ophthalmic solution, instill 1 drop into both eyes four times a day for 7 days, Disp: , Rfl: 0  No Known Allergies   ROS  ***  Objective  Vitals:   04/29/17 0951  BP: 135/78  Pulse: 74  Resp: 16  Temp: 98.2 F (36.8 C)  TempSrc: Oral  SpO2: 98%  Weight: 176 lb 1.6 oz (79.9 kg)  Height: 5\' 1"  (1.549 m)    Physical Exam   ***  Recent Results (from the past 2160 hour(s))  POCT Glucose (CBG)     Status:  Abnormal   Collection Time: 04/29/17  9:57 AM  Result Value Ref Range   POC Glucose 120 (A) 70 - 99 mg/dl  POCT HgB A1C     Status: Normal   Collection Time: 04/29/17  9:58 AM  Result Value Ref Range   Hemoglobin A1C 5.8      Assessment & Plan  1. Controlled type 2 diabetes mellitus without complication, without long-term current use of insulin (HCC) *** - POCT HgB A1C - POCT Glucose (CBG)   Thea Holshouser [facility] 04/29/2017 9:59 AM

## 2017-04-29 NOTE — Progress Notes (Signed)
135  

## 2017-05-18 ENCOUNTER — Ambulatory Visit (INDEPENDENT_AMBULATORY_CARE_PROVIDER_SITE_OTHER): Payer: PPO | Admitting: General Surgery

## 2017-05-18 ENCOUNTER — Encounter: Payer: Self-pay | Admitting: General Surgery

## 2017-05-18 VITALS — BP 144/68 | HR 72 | Resp 12 | Ht 61.0 in | Wt 178.0 lb

## 2017-05-18 DIAGNOSIS — C50412 Malignant neoplasm of upper-outer quadrant of left female breast: Secondary | ICD-10-CM

## 2017-05-18 DIAGNOSIS — Z170 Estrogen receptor positive status [ER+]: Secondary | ICD-10-CM | POA: Diagnosis not present

## 2017-05-18 DIAGNOSIS — Z853 Personal history of malignant neoplasm of breast: Secondary | ICD-10-CM

## 2017-05-18 NOTE — Patient Instructions (Signed)
Patient to return to clinic in one year to follow up with Dr. Bary Castilla for a repeat CA 27-29 and continued monitoring.

## 2017-05-18 NOTE — Progress Notes (Signed)
Patient ID: Lindsey [name], female   DOB: [birth year], 76 y.o.   MRN: [id number]  Chief Complaint  Patient presents with  . Follow-up    HPI Lindsey [name] is a 76 y.o. female.  Here for follow up bilateral breast cancer. Patient states she has pain over the right breast incision site from the mastectomy done in 1983. 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   HPI  Past Medical History:  Diagnosis Date  . Arthritis   . Breast cancer ([place]) 1983   right breast cancer  . Breast cancer in female [facility]) 11-04-15   Left Breast INVASIVE MAMMARY CARCINOMA  T1c, no ER/PR positive Her 2 Negative  . Depression   . Diabetes mellitus without complication ([place])   . Hyperlipidemia   . Hypertension   . Hypothyroidism   . Osteoporosis   . Thyroid disease     Past Surgical History:  Procedure Laterality Date  . BREAST BIOPSY Left 11-04-15  . BREAST BIOPSY Left 10-10-07  . CATARACT EXTRACTION Bilateral 1990  . CHOLECYSTECTOMY    . MASTECTOMY Right 1983   Dr Myrle Sheng  . MASTECTOMY Right 1983  . MASTECTOMY W/ SENTINEL NODE BIOPSY Left 12/12/2015   Procedure: MASTECTOMY WITH SENTINEL LYMPH NODE BIOPSY;  Surgeon: Christene Lye, MD;  Location: [facility] ORS;  Service: General;  Laterality: Left;    Family History  Problem Relation Age of Onset  . Kidney disease Mother   . Heart disease Father   . Stroke Sister   . Cancer Sister        breast  . Breast cancer Sister   . Stroke Brother   . Diabetes Brother   . Hypertension Brother   . Cancer Daughter 79       DCIS/lumpectomy/genetic negative    Social History Social History  Substance Use Topics  . Smoking status: Never Smoker  . Smokeless tobacco: Never Used     Comment: no passive smoke in home  . Alcohol use No    No Known Allergies  Current Outpatient Prescriptions  Medication Sig Dispense Refill  . alendronate (FOSAMAX) 70 MG tablet Take 1 tablet (70 mg total) by  mouth once a week. Take with a full glass of water on an empty stomach. 52 tablet 0  . aspirin 81 MG tablet Take by mouth.    [name] atorvastatin (LIPITOR) 40 MG tablet Take 1 tablet (40 mg total) by mouth daily. 90 tablet 1  . cholecalciferol (VITAMIN D) 1000 UNITS tablet Take by mouth.    [name] ketoconazole (NIZORAL) 2 % cream APPLY TO THE SKIN TWICE A DAY TO INFRAMAMMARY AND GROIN AS DIRECTED  0  . letrozole (FEMARA) 2.5 MG tablet Take 1 tablet (2.5 mg total) by mouth daily. 90 tablet 3  . lisinopril (PRINIVIL,ZESTRIL) 20 MG tablet Take 1 tablet (20 mg total) by mouth daily. 90 tablet 1  . metFORMIN (GLUCOPHAGE) 500 MG tablet Take 1 tablet (500 mg total) by mouth 2 (two) times daily with a meal. TAKE 1 TABLET TWICE DAILY WITH A MEAL 180 tablet 0  . naproxen sodium (ALEVE) 220 MG tablet Take by mouth.    [name] ofloxacin (OCUFLOX) 0.3 % ophthalmic solution instill 1 drop into both eyes four times a day for 7 days  0  . sertraline (ZOLOFT) 100 MG tablet Take 1 tablet (100 mg total) by mouth daily. 90 tablet 0  . sitaGLIPtin (JANUVIA)  50 MG tablet Take 1 tablet (50 mg total) by mouth daily. 90 tablet 0  . SYNTHROID 112 MCG tablet Take 1 tablet (112 mcg total) by mouth daily. 90 tablet 1  . vitamin B-12 (CYANOCOBALAMIN) 1000 MCG tablet Take 1,000 mcg by mouth every morning.     No current facility-administered medications for this visit.     Review of Systems Review of Systems  Constitutional: Negative.   Respiratory: Negative.   Cardiovascular: Negative.     Blood pressure (!) 144/68, pulse 72, resp. rate 12, height '5\' 1"'  (1.549 m), weight 178 lb (80.7 kg).  Physical Exam Physical Exam  Constitutional: She is oriented to person, place, and time. She appears well-developed and well-nourished.  HENT:  Mouth/Throat: Oropharynx is clear and moist.  Eyes: Conjunctivae are normal. No scleral icterus.  Neck: Neck supple.  Cardiovascular: Normal rate, regular rhythm and normal heart sounds.     Pulmonary/Chest: Effort normal and breath sounds normal. No respiratory distress.    Abdominal: Soft. Bowel sounds are normal. There is no tenderness.  Lymphadenopathy:    She has no cervical adenopathy.    She has no axillary adenopathy.  Neurological: She is alert and oriented to person, place, and time.  Skin: Skin is warm and dry.  Psychiatric: Her behavior is normal.    Data Reviewed Prior notes and labs reviewed  Assessment    Bilateral breast cancer s/p bilateral mastectomy - right side 1983, 1 year post left side mastectomy due to T1c, N0 ER/PR positive, HER2 neg breast cancer - physical exam and CA 27.29 from 11/18/16 is stable. Patient is tolerating the discomfort over the keloid scar and does not want any further intervention at this time.  Pt is doing well with Letrazole    Plan    Patient to continue Letrozole for 5 years. Will draw CA 27.29 today in clinic.   Patient to return to clinic in one year to follow up with Dr. Bary Castilla for a repeat CA 27-29 and continued monitoring.   HPI, Physical Exam, Assessment and Plan have been scribed under the direction and in the presence of Mckinley Jewel, MD Karie Fetch, RN     I have completed the exam and reviewed the above documentation for accuracy and completeness.  I agree with the above.  Haematologist has been used and any errors in dictation or transcription are unintentional.  Seeplaputhur G. Lindsey [name], M.D., F.A.C.S.    Karie Fetch M 05/18/2017, 10:29 AM

## 2017-05-19 ENCOUNTER — Telehealth: Payer: Self-pay | Admitting: *Deleted

## 2017-05-19 LAB — CANCER ANTIGEN 27.29: CA 27.29: 23.7 U/mL (ref 0.0–38.6)

## 2017-05-19 NOTE — Telephone Encounter (Signed)
-----   Message from Christene Lye, MD sent at 05/19/2017  7:29 AM EDT ----- Please inform pt-normal lab

## 2017-05-19 NOTE — Telephone Encounter (Signed)
Notified patient as instructed, patient pleased. Discussed follow-up appointments, patient agrees  

## 2017-07-13 DIAGNOSIS — M1712 Unilateral primary osteoarthritis, left knee: Secondary | ICD-10-CM | POA: Diagnosis not present

## 2017-07-13 DIAGNOSIS — M1711 Unilateral primary osteoarthritis, right knee: Secondary | ICD-10-CM | POA: Diagnosis not present

## 2017-07-19 DIAGNOSIS — M1711 Unilateral primary osteoarthritis, right knee: Secondary | ICD-10-CM | POA: Diagnosis not present

## 2017-07-27 ENCOUNTER — Ambulatory Visit: Payer: Self-pay

## 2017-07-28 DIAGNOSIS — M7061 Trochanteric bursitis, right hip: Secondary | ICD-10-CM | POA: Diagnosis not present

## 2017-07-29 ENCOUNTER — Ambulatory Visit (INDEPENDENT_AMBULATORY_CARE_PROVIDER_SITE_OTHER): Payer: PPO

## 2017-07-29 ENCOUNTER — Ambulatory Visit: Payer: Self-pay

## 2017-07-29 ENCOUNTER — Ambulatory Visit: Payer: Self-pay | Admitting: Family Medicine

## 2017-07-29 VITALS — BP 134/70 | HR 96 | Temp 98.3°F | Resp 16 | Ht 61.0 in

## 2017-07-29 DIAGNOSIS — Z23 Encounter for immunization: Secondary | ICD-10-CM

## 2017-07-29 DIAGNOSIS — Z0000 Encounter for general adult medical examination without abnormal findings: Secondary | ICD-10-CM | POA: Diagnosis not present

## 2017-07-29 NOTE — Patient Instructions (Addendum)
Lindsey [name] , Thank you for taking time to come for your Medicare Wellness Visit. I appreciate your ongoing commitment to your health goals. Please review the following plan we discussed and let me know if I can assist you in the future.   Screening recommendations/referrals: Colonoscopy: Cologuard ordered 09/30/16. Please complete this exam and submit specimen to the company for processing. Mammogram: Completed 11/04/15. Breast cancer screenings no longer required. Bone Density: Completed 10/11/14. Repeat osteoporotic testing every 2 years. Please call [phone] to schedule your bone density exam.  Recommended yearly ophthalmology/optometry visit for glaucoma screening and checkup Recommended yearly dental visit for hygiene and checkup  Vaccinations: Influenza vaccine: Completed 04/29/17 Pneumococcal vaccine: Completed PCV13 08/13/14 and PPSV23 given today. Tdap vaccine: Declined. Please call your insurance company to determine your out of pocket expense. Shingles vaccine: Declined. Please call your insurance company to determine your out of pocket expense.  Advanced directives: Please bring a copy of your POA (Power of Attorney) and/or Living Will to your next appointment.   Conditions/risks identified: Recommend to drink 6-8 8oz glasses of water per day  Next appointment: You are scheduled to see Dr. Manuella Ghazi on 08/03/17 @ 11:40am.   Please schedule your annual wellness exam with your Nurse Health Advisor in one year.  Preventive Care 40 Years and Older, Female Preventive care refers to lifestyle choices and visits with your health care provider that can promote health and wellness. What does preventive care include?  A yearly physical exam. This is also called an annual well check.  Dental exams once or twice a year.  Routine eye exams. Ask your health care provider how often you should have your eyes checked.  Personal lifestyle choices, including:  Daily care of your teeth and  gums.  Regular physical activity.  Eating a healthy diet.  Avoiding tobacco and drug use.  Limiting alcohol use.  Practicing safe sex.  Taking low-dose aspirin every day.  Taking vitamin and mineral supplements as recommended by your health care provider. What happens during an annual well check? The services and screenings done by your health care provider during your annual well check will depend on your age, overall health, lifestyle risk factors, and family history of disease. Counseling  Your health care provider Mumaw ask you questions about your:  Alcohol use.  Tobacco use.  Drug use.  Emotional well-being.  Home and relationship well-being.  Sexual activity.  Eating habits.  History of falls.  Memory and ability to understand (cognition).  Work and work Statistician.  Reproductive health. Screening  You C[place]low have the following tests or measurements:  Height, weight, and BMI.  Blood pressure.  Lipid and cholesterol levels. These Champagne be checked every 5 years, or more frequently if you are over 87 years old.  Skin check.  Lung cancer screening. You [place] have this screening every year starting at age 28 if you have a 30-pack-year history of smoking and currently smoke or have quit within the past 15 years.  Fecal occult blood test (FOBT) of the stool. You Kroh have this test every year starting at age 55.  Flexible sigmoidoscopy or colonoscopy. You Rayos have a sigmoidoscopy every 5 years or a colonoscopy every 10 years starting at age 1.  Hepatitis C blood test.  Hepatitis B blood test.  Sexually transmitted disease (STD) testing.  Diabetes screening. This is done by checking your blood sugar (glucose) after you have not eaten for a while (fasting). You Messerschmidt have this done every 1-3 years.  Bone density scan. This is done to screen for osteoporosis. You H[place] have this done starting at age 73.  Mammogram. This Kinney be done every 1-2 years. Talk to your  health care provider about how often you should have regular mammograms. Talk with your health care provider about your test results, treatment options, and if necessary, the need for more tests. Vaccines  Your health care provider Berrocal recommend certain vaccines, such as:  Influenza vaccine. This is recommended every year.  Tetanus, diphtheria, and acellular pertussis (Tdap, Td) vaccine. You Prew need a Td booster every 10 years.  Zoster vaccine. You Hoyt need this after age 44.  Pneumococcal 13-valent conjugate (PCV13) vaccine. One dose is recommended after age 82.  Pneumococcal polysaccharide (PPSV23) vaccine. One dose is recommended after age 58. Talk to your health care provider about which screenings and vaccines you need and how often you need them. This information is not intended to replace advice given to you by your health care provider. Make sure you discuss any questions you have with your health care provider. Document Released: 09/05/2015 Document Revised: 04/28/2016 Document Reviewed: 06/10/2015 Elsevier Interactive Patient Education  2017 [place] Prevention in the Home Falls can cause injuries. They can happen to people of all ages. There are many things you can do to make your home safe and to help prevent falls. What can I do on the outside of my home?  Regularly fix the edges of walkways and driveways and fix any cracks.  Remove anything that might make you trip as you walk through a door, such as a raised step or threshold.  Trim any bushes or trees on the path to your home.  Use bright outdoor lighting.  Clear any walking paths of anything that might make someone trip, such as rocks or tools.  Regularly check to see if handrails are loose or broken. Make sure that both sides of any steps have handrails.  Any raised decks and porches should have guardrails on the edges.  Have any leaves, snow, or ice cleared regularly.  Use sand or salt on walking  paths during winter.  Clean up any spills in your garage right away. This includes oil or grease spills. What can I do in the bathroom?  Use night lights.  Install grab bars by the toilet and in the tub and shower. Do not use towel bars as grab bars.  Use non-skid mats or decals in the tub or shower.  If you need to sit down in the shower, use a plastic, non-slip stool.  Keep the floor dry. Clean up any water that spills on the floor as soon as it happens.  Remove soap buildup in the tub or shower regularly.  Attach bath mats securely with double-sided non-slip rug tape.  Do not have throw rugs and other things on the floor that can make you trip. What can I do in the bedroom?  Use night lights.  Make sure that you have a light by your bed that is easy to reach.  Do not use any sheets or blankets that are too big for your bed. They should not hang down onto the floor.  Have a firm chair that has side arms. You can use this for support while you get dressed.  Do not have throw rugs and other things on the floor that can make you trip. What can I do in the kitchen?  Clean up any spills right away.  Avoid walking  on wet floors.  Keep items that you use a lot in easy-to-reach places.  If you need to reach something above you, use a strong step stool that has a grab bar.  Keep electrical cords out of the way.  Do not use floor polish or wax that makes floors slippery. If you must use wax, use non-skid floor wax.  Do not have throw rugs and other things on the floor that can make you trip. What can I do with my stairs?  Do not leave any items on the stairs.  Make sure that there are handrails on both sides of the stairs and use them. Fix handrails that are broken or loose. Make sure that handrails are as long as the stairways.  Check any carpeting to make sure that it is firmly attached to the stairs. Fix any carpet that is loose or worn.  Avoid having throw rugs at  the top or bottom of the stairs. If you do have throw rugs, attach them to the floor with carpet tape.  Make sure that you have a light switch at the top of the stairs and the bottom of the stairs. If you do not have them, ask someone to add them for you. What else can I do to help prevent falls?  Wear shoes that:  Do not have high heels.  Have rubber bottoms.  Are comfortable and fit you well.  Are closed at the toe. Do not wear sandals.  If you use a stepladder:  Make sure that it is fully opened. Do not climb a closed stepladder.  Make sure that both sides of the stepladder are locked into place.  Ask someone to hold it for you, if possible.  Clearly mark and make sure that you can see:  Any grab bars or handrails.  First and last steps.  Where the edge of each step is.  Use tools that help you move around (mobility aids) if they are needed. These include:  Canes.  Walkers.  Scooters.  Crutches.  Turn on the lights when you go into a dark area. Replace any light bulbs as soon as they burn out.  Set up your furniture so you have a clear path. Avoid moving your furniture around.  If any of your floors are uneven, fix them.  If there are any pets around you, be aware of where they are.  Review your medicines with your doctor. Some medicines can make you feel dizzy. This can increase your chance of falling. Ask your doctor what other things that you can do to help prevent falls. This information is not intended to replace advice given to you by your health care provider. Make sure you discuss any questions you have with your health care provider. Document Released: 06/05/2009 Document Revised: 01/15/2016 Document Reviewed: 09/13/2014 Elsevier Interactive Patient Education  2017 [employer].

## 2017-07-29 NOTE — Progress Notes (Signed)
Subjective:   Lindsey [name] is a 76 y.o. female who presents for Medicare Annual (Subsequent) preventive examination.  Review of Systems:  N/A Cardiac Risk Factors include: advanced age (>41men, >61 women);diabetes mellitus;dyslipidemia;sedentary lifestyle;obesity (BMI >30kg/m2);hypertension     Objective:     Vitals: BP (!) 152/60 (BP Location: Left Arm, Patient Position: Sitting, Cuff Size: Normal)   Pulse 96   Temp 98.3 F (36.8 C) (Oral)   Resp 16   Ht 5\' 1"  (1.549 m)   BMI 33.63 kg/m   Body mass index is 33.63 kg/m.  Advanced Directives 07/29/2017 04/29/2017 10/28/2016 09/30/2016 07/01/2016 03/30/2016 01/16/2016  Does Patient Have a Medical Advance Directive? Yes No No No No No No  Type of Paramedic of [place];Living will - - - - - -  Copy of [place] in Chart? No - copy requested - - - - - -  Would patient like information on creating a medical advance directive? - - - - No - patient declined information No - patient declined information No - patient declined information    Tobacco Social History   Tobacco Use  Smoking Status Never Smoker  Smokeless Tobacco Never Used  Tobacco Comment   Non-smoker. Smoking cessation materials contraindicated     Counseling given: Not Answered Comment: Non-smoker. Smoking cessation materials contraindicated   Clinical Intake:  Pre-visit preparation completed: Yes  Pain : No/denies pain     BMI - recorded: 33.63 Nutritional Status: BMI > 30  Obese Nutritional Risks: None Diabetes: Yes CBG done?: No Did pt. bring in CBG monitor from home?: No  Activities of Daily Living: Independent Ambulation: Independent with device- listed below Home Assistive Devices/Equipment: Eyeglasses, Dentures (specify type), Shower/tub chair(full set of upper dentures) Medication Administration: Independent Home Management: Independent  Barriers to Care Management & Learning: None  Do you feel unsafe in  your current relationship?: No(married) Do you feel physically threatened by others?: No Anyone hurting you at home, work, or school?: No  How often do you need to have someone help you when you read instructions, pamphlets, or other written materials from your doctor or pharmacy?: 1 - Never What is the last grade level you completed in school?: 12th grade  Interpreter Needed?: No  Information entered by :: AEversole, LPN  Past Medical History:  Diagnosis Date  . Arthritis   . Breast cancer ([place]) 1983   right breast cancer  . 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 Bursitis    R knee  . Depression   . Diabetes mellitus without complication ([place])   . Hyperlipidemia   . Hypertension   . Hypothyroidism   . Osteoporosis   . Thyroid disease    Past Surgical History:  Procedure Laterality Date  . BREAST BIOPSY Left 11-04-15  . BREAST BIOPSY Left 10-10-07  . CATARACT EXTRACTION Bilateral 1990  . CHOLECYSTECTOMY    . MASTECTOMY Right 1983   Dr Myrle Sheng  . MASTECTOMY Right 1983  . MASTECTOMY W/ SENTINEL NODE BIOPSY Left 12/12/2015   Procedure: MASTECTOMY WITH SENTINEL LYMPH NODE BIOPSY;  Surgeon: Christene Lye, MD;  Location: [facility] ORS;  Service: General;  Laterality: Left;   Family History  Problem Relation Age of Onset  . Kidney disease Mother   . Heart disease Father   . Stroke Sister   . Cancer Sister  breast  . Breast cancer Sister   . Stroke Brother   . Diabetes Brother   . Hypertension Brother   . Cancer Daughter 42       DCIS/lumpectomy/genetic negative  . Breast cancer Daughter    Social History   Socioeconomic History  . Marital status: Married    Spouse name: None  . Number of children: 3  . Years of education: None  . Highest education level: None  Social Needs  . Financial resource strain: Not hard at all  . Food insecurity - worry: Never true  . Food  insecurity - inability: Never true  . Transportation needs - medical: No  . Transportation needs - non-medical: No  Occupational History  . Occupation: Retired  Tobacco Use  . Smoking status: Never Smoker  . Smokeless tobacco: Never Used  . Tobacco comment: Non-smoker. Smoking cessation materials contraindicated  Substance and Sexual Activity  . Alcohol use: No    Alcohol/week: 0.0 oz  . Drug use: No  . Sexual activity: No  Other Topics Concern  . None  Social History Narrative  . None    Outpatient Encounter Medications as of 07/29/2017  Medication Sig  . alendronate (FOSAMAX) 70 MG tablet Take 1 tablet (70 mg total) by mouth once a week. Take with a full glass of water on an empty stomach.  [name] aspirin 81 MG tablet Take by mouth.  [name] atorvastatin (LIPITOR) 40 MG tablet Take 1 tablet (40 mg total) by mouth daily.  . cholecalciferol (VITAMIN D) 1000 UNITS tablet Take by mouth.  [name] ketoconazole (NIZORAL) 2 % cream APPLY TO THE SKIN TWICE A DAY TO INFRAMAMMARY AND GROIN AS DIRECTED  . letrozole (FEMARA) 2.5 MG tablet Take 1 tablet (2.5 mg total) by mouth daily.  [name] lisinopril (PRINIVIL,ZESTRIL) 20 MG tablet Take 1 tablet (20 mg total) by mouth daily.  . metFORMIN (GLUCOPHAGE) 500 MG tablet Take 1 tablet (500 mg total) by mouth 2 (two) times daily with a meal. TAKE 1 TABLET TWICE DAILY WITH A MEAL  . naproxen sodium (ALEVE) 220 MG tablet Take by mouth.  [name] ofloxacin (OCUFLOX) 0.3 % ophthalmic solution instill 1 drop into both eyes four times a day for 7 days  . sertraline (ZOLOFT) 100 MG tablet Take 1 tablet (100 mg total) by mouth daily.  . sitaGLIPtin (JANUVIA) 50 MG tablet Take 1 tablet (50 mg total) by mouth daily.  [name] SYNTHROID 112 MCG tablet Take 1 tablet (112 mcg total) by mouth daily.  . vitamin B-12 (CYANOCOBALAMIN) 1000 MCG tablet Take 1,000 mcg by mouth every morning.  . traMADol (ULTRAM) 50 MG tablet Take 50 mg by mouth at bedtime.   No facility-administered encounter  medications on file as of 07/29/2017.     Activities of Daily Living In your present state of health, do you have any difficulty performing the following activities: 07/29/2017 04/29/2017  Hearing? Tempie Donning  Comment feels ears are "clogged". Recommended to schedule an appt with audiologist to establish care and undergo evaluation -  Vision? N Y  Comment - glasses  Difficulty concentrating or making decisions? Y N  Comment short term memory loss -  Walking or climbing stairs? Y Y  Comment R lower extremity pain -  Dressing or bathing? N N  Doing errands, shopping? N Y  Conservation officer, nature and eating ? N -  Using the Toilet? N -  In the past six months, have you accidently leaked urine? Y -  Comment stress incontinence -  Do you have problems with loss of bowel control? Y -  Comment occassionally unable to make it to the bathroom in time -  Managing your Medications? N -  Managing your Finances? N -  Housekeeping or managing your Housekeeping? N -  Some recent data might be hidden    Timed Get Up and Go performed: Yes. 12 sec. No intervention required  Patient Care Team: Roselee Nova, MD as PCP - General (Family Medicine) Christene Lye, MD as Consulting Physician (General Surgery) Melrose Nakayama, MD as Consulting Physician (Orthopedic Surgery)    Assessment:   Exercise Activities and Dietary recommendations Current Exercise Habits: The patient does not participate in regular exercise at present, Exercise limited by: orthopedic condition(s)(R knee bursitis)  Goals    . DIET - INCREASE WATER INTAKE     Recommend to drink 6-8 8oz glasses of water per day      Fall Risk Fall Risk  07/29/2017 04/29/2017 10/28/2016 09/30/2016 07/01/2016  Falls in the past year? No No No No No  Risk for fall due to : - - - - -  Risk for fall due to: Comment - - - - -   Is the patient's home free of loose throw rugs in walkways, pet beds, electrical cords, etc?   yes      Grab bars in the bathroom?  no      Handrails on the stairs?   yes      Adequate lighting?   yes  Depression Screen PHQ 2/9 Scores 07/29/2017 04/29/2017 10/28/2016 09/30/2016  PHQ - 2 Score 2 0 0 0  PHQ- 9 Score 9 - - -     Cognitive Function     6CIT Screen 07/29/2017  What Year? 0 points  What month? 0 points  What time? 0 points  Count back from 20 0 points  Months in reverse 0 points  Repeat phrase 4 points  Total Score 4    Immunization History  Administered Date(s) Administered  . Influenza, High Dose Seasonal PF 06/12/2014, 06/09/2015, 07/01/2016, 04/29/2017  . Influenza,inj,Quad PF,6+ Mos 07/23/2013  . Pneumococcal Conjugate-13 08/13/2014  . Pneumococcal Polysaccharide-23 07/29/2017   Due for Shingrix and Tetanus vaccines. Declined to receive Tdap today. Declined. Pt advised to call her insurance company to determine her out of pocket expense. Advised she Paczkowski also receive this vaccine at her local pharmacy or Health Dept.  Screening Tests Health Maintenance  Topic Date Due  . TETANUS/TDAP  07/29/2018 (Originally 12/30/1959)  . HEMOGLOBIN A1C  10/27/2017  . FOOT EXAM  10/28/2017  . OPHTHALMOLOGY EXAM  04/04/2018  . INFLUENZA VACCINE  Completed  . DEXA SCAN  Completed  . PNA vac Low Risk Adult  Completed   Cancer Screenings: Lung: Low Dose CT Chest recommended if Age 33-80 years, 30 pack-year currently smoking OR have quit w/in 15years. Patient does not qualify. Breast: Up to date on Mammogram? Yes  Had a bilateral mastectomy. Breast cancer screenings no longer required Up to date of Bone Density/Dexa? No. Dr. Manuella Ghazi ordered DEXA on 09/30/16. Pt still has not completed this exam. Education provided re: importance of this exam. Provided pt with contact information to schedule her exam as soon as she is able. Colorectal: Dr. Manuella Ghazi ordered Cologuard on 09/30/16. Pt still has not completed this exam. Confirmed she has received the supplies. Pt states she has had various issues/circumstances in her life that have  prevented her from completing test. Education provided re: importance of completing  this exam. Verbalized acceptance and understanding. States she will perform this test some time this week and send package back the company to complete processing.  Additional Screenings: HIV Screening: does not qualify Hepatitis C Screening: does not qualify     Plan:  I have personally reviewed and addressed the Medicare Annual Wellness questionnaire and have noted the following in the patient's chart:  A. Medical and social history B. Use of alcohol, tobacco or illicit drugs  C. Current medications and supplements D. Functional ability and status E.  Nutritional status F.  Physical activity G. Advance directives and Code Status: Pt provided with MOST and DNR documents for his/her review. Pt has been advised that he/she will only need to complete the document that is most appropriate to suit his/her health care wishes. Once completed, pt has been advised to return the appropriate document to our office for the physician to review and sign. H. List of other physicians I.  Hospitalizations, surgeries, and ER visits in previous 12 months J.  [place] such as hearing and vision if needed, cognitive and depression L. Referrals and appointments - none  In addition, I have reviewed and discussed with patient certain preventive protocols, quality metrics, and best practice recommendations. A written personalized care plan for preventive services as well as general preventive health recommendations were provided to patient.  See attached scanned questionnaire for additional information.   Signed,  Aleatha Borer, LPN Nurse Health Advisor

## 2017-07-29 NOTE — Progress Notes (Signed)
Given pt anxiety for visit, B/P re-checked manually at the end of visit. 134/70, L arm, sitting, normal blood pressure cuff. Elevated b/p improved. Denies chest pain, chest pressure or dyspnea. No pedal edema noted. No intervention required at this time.

## 2017-08-03 ENCOUNTER — Ambulatory Visit: Payer: Self-pay | Admitting: Family Medicine

## 2017-08-18 ENCOUNTER — Other Ambulatory Visit: Payer: Self-pay | Admitting: Orthopedic Surgery

## 2017-08-18 DIAGNOSIS — M79604 Pain in right leg: Secondary | ICD-10-CM

## 2017-08-25 ENCOUNTER — Encounter: Payer: Self-pay | Admitting: Family Medicine

## 2017-08-25 ENCOUNTER — Ambulatory Visit (INDEPENDENT_AMBULATORY_CARE_PROVIDER_SITE_OTHER): Payer: PPO | Admitting: Family Medicine

## 2017-08-25 VITALS — BP 126/68 | HR 83 | Temp 98.3°F | Resp 14 | Ht 61.0 in | Wt 174.0 lb

## 2017-08-25 DIAGNOSIS — J0110 Acute frontal sinusitis, unspecified: Secondary | ICD-10-CM

## 2017-08-25 DIAGNOSIS — E119 Type 2 diabetes mellitus without complications: Secondary | ICD-10-CM

## 2017-08-25 DIAGNOSIS — I10 Essential (primary) hypertension: Secondary | ICD-10-CM | POA: Diagnosis not present

## 2017-08-25 DIAGNOSIS — E039 Hypothyroidism, unspecified: Secondary | ICD-10-CM | POA: Diagnosis not present

## 2017-08-25 DIAGNOSIS — F331 Major depressive disorder, recurrent, moderate: Secondary | ICD-10-CM | POA: Diagnosis not present

## 2017-08-25 LAB — POCT GLYCOSYLATED HEMOGLOBIN (HGB A1C): Hemoglobin A1C: 6

## 2017-08-25 LAB — GLUCOSE, POCT (MANUAL RESULT ENTRY): POC Glucose: 132 mg/dl — AB (ref 70–99)

## 2017-08-25 MED ORDER — SERTRALINE HCL 100 MG PO TABS: 100.0000 mg | ORAL_TABLET | Freq: Every day | ORAL | 0 refills | Status: DC

## 2017-08-25 MED ORDER — LISINOPRIL 20 MG PO TABS: 20.0000 mg | ORAL_TABLET | Freq: Every day | ORAL | 1 refills | Status: DC

## 2017-08-25 MED ORDER — AMOXICILLIN-POT CLAVULANATE 875-125 MG PO TABS
1.0000 | ORAL_TABLET | Freq: Two times a day (BID) | ORAL | 0 refills | Status: DC
Start: 2017-08-25 — End: 2017-11-24

## 2017-08-25 MED ORDER — SITAGLIPTIN PHOSPHATE 50 MG PO TABS: 50.0000 mg | ORAL_TABLET | Freq: Every day | ORAL | 0 refills | Status: DC

## 2017-08-25 MED ORDER — METFORMIN HCL 500 MG PO TABS: 500.0000 mg | ORAL_TABLET | Freq: Two times a day (BID) | ORAL | 0 refills | Status: DC

## 2017-08-25 MED ORDER — SYNTHROID 112 MCG PO TABS: 112.0000 ug | ORAL_TABLET | Freq: Every day | ORAL | 1 refills | Status: DC

## 2017-08-25 NOTE — Progress Notes (Signed)
Name: Lindsey [name]   MRN: [id number]    DOB: 1940-10-01   Date:08/25/2017       Progress Note  Subjective  Chief Complaint  Chief Complaint  Patient presents with  . Diabetes    Pt do not check her sugars  . Hyperlipidemia    Pt states she is trying to eat right; have trouble.   . Hypertension    Pt denies any issue   . Hypothyroidism  . Medication Refill    Diabetes  She presents for her follow-up diabetic visit. She has type 2 diabetes mellitus. Her disease course has been stable. Pertinent negatives for hypoglycemia include no dizziness, headaches, sweats or tremors. Pertinent negatives for diabetes include no chest pain, no fatigue, no foot paresthesias, no polydipsia and no polyuria. Pertinent negatives for diabetic complications include no CVA, heart disease or peripheral neuropathy. Current diabetic treatment includes oral agent (dual therapy). Her weight is stable. She is following a generally healthy diet. She rarely (walks every day around the house.) participates in exercise. Frequency home blood tests: does not check her blood glucose at home. Eye exam is current.  Hyperlipidemia  This is a chronic problem. The problem is controlled. Recent lipid tests were reviewed and are normal. Pertinent negatives include no chest pain, leg pain, myalgias or shortness of breath. Current antihyperlipidemic treatment includes statins. Risk factors for coronary artery disease include diabetes mellitus.  Hypertension  This is a chronic problem. The problem is unchanged. The problem is controlled. Pertinent negatives include no chest pain, headaches, shortness of breath or sweats. Past treatments include ACE inhibitors. There is no history of CVA.  Sinusitis  This is a new problem. There has been no fever. Associated symptoms include congestion, sinus pressure and sneezing. Pertinent negatives include no chills, ear pain, headaches, shortness of breath or sore throat. Past treatments include oral  decongestants (has tried taking Mucinex).     Past Medical History:  Diagnosis Date  . Arthritis   . Breast cancer ([place]) 1983   right breast cancer  . 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 Bursitis    R knee  . Depression   . Diabetes mellitus without complication ([place])   . Hyperlipidemia   . Hypertension   . Hypothyroidism   . Osteoporosis   . Thyroid disease     Past Surgical History:  Procedure Laterality Date  . BREAST BIOPSY Left 11-04-15  . BREAST BIOPSY Left 10-10-07  . CATARACT EXTRACTION Bilateral 1990  . CHOLECYSTECTOMY    . MASTECTOMY Right 1983   Dr Myrle Sheng  . MASTECTOMY Right 1983  . MASTECTOMY W/ SENTINEL NODE BIOPSY Left 12/12/2015   Procedure: MASTECTOMY WITH SENTINEL LYMPH NODE BIOPSY;  Surgeon: Christene Lye, MD;  Location: [facility] ORS;  Service: General;  Laterality: Left;    Family History  Problem Relation Age of Onset  . Kidney disease Mother   . Heart disease Father   . Stroke Sister   . Cancer Sister        breast  . Breast cancer Sister   . Stroke Brother   . Diabetes Brother   . Hypertension Brother   . Cancer Daughter 71       DCIS/lumpectomy/genetic negative  . Breast cancer Daughter     Social History   Socioeconomic History  . Marital status: Married    Spouse name: Not on file  .  Number of children: 3  . Years of education: Not on file  . Highest education level: Not on file  Social Needs  . Financial resource strain: Not hard at all  . Food insecurity - worry: Never true  . Food insecurity - inability: Never true  . Transportation needs - medical: No  . Transportation needs - non-medical: No  Occupational History  . Occupation: Retired  Tobacco Use  . Smoking status: Never Smoker  . Smokeless tobacco: Never Used  . Tobacco comment: Non-smoker. Smoking cessation materials contraindicated  Substance and Sexual Activity  . Alcohol  use: No    Alcohol/week: 0.0 oz  . Drug use: No  . Sexual activity: No  Other Topics Concern  . Not on file  Social History Narrative  . Not on file     Current Outpatient Medications:  .  alendronate (FOSAMAX) 70 MG tablet, Take 1 tablet (70 mg total) by mouth once a week. Take with a full glass of water on an empty stomach., Disp: 52 tablet, Rfl: 0 .  aspirin 81 MG tablet, Take by mouth., Disp: , Rfl:  .  atorvastatin (LIPITOR) 40 MG tablet, Take 1 tablet (40 mg total) by mouth daily., Disp: 90 tablet, Rfl: 1 .  cholecalciferol (VITAMIN D) 1000 UNITS tablet, Take by mouth., Disp: , Rfl:  .  ketoconazole (NIZORAL) 2 % cream, APPLY TO THE SKIN TWICE A DAY TO INFRAMAMMARY AND GROIN AS DIRECTED, Disp: , Rfl: 0 .  letrozole (FEMARA) 2.5 MG tablet, Take 1 tablet (2.5 mg total) by mouth daily., Disp: 90 tablet, Rfl: 3 .  lisinopril (PRINIVIL,ZESTRIL) 20 MG tablet, Take 1 tablet (20 mg total) by mouth daily., Disp: 90 tablet, Rfl: 1 .  naproxen sodium (ALEVE) 220 MG tablet, Take by mouth., Disp: , Rfl:  .  ofloxacin (OCUFLOX) 0.3 % ophthalmic solution, instill 1 drop into both eyes four times a day for 7 days, Disp: , Rfl: 0 .  sertraline (ZOLOFT) 100 MG tablet, Take 1 tablet (100 mg total) by mouth daily., Disp: 90 tablet, Rfl: 0 .  sitaGLIPtin (JANUVIA) 50 MG tablet, Take 1 tablet (50 mg total) by mouth daily., Disp: 90 tablet, Rfl: 0 .  SYNTHROID 112 MCG tablet, Take 1 tablet (112 mcg total) by mouth daily., Disp: 90 tablet, Rfl: 1 .  traMADol (ULTRAM) 50 MG tablet, Take 50 mg by mouth at bedtime., Disp: , Rfl: 1 .  vitamin B-12 (CYANOCOBALAMIN) 1000 MCG tablet, Take 1,000 mcg by mouth every morning., Disp: , Rfl:  .  metFORMIN (GLUCOPHAGE) 500 MG tablet, Take 1 tablet (500 mg total) by mouth 2 (two) times daily with a meal. TAKE 1 TABLET TWICE DAILY WITH A MEAL, Disp: 180 tablet, Rfl: 0  No Known Allergies   Review of Systems  Constitutional: Negative for chills, fatigue and fever.   HENT: Positive for congestion, sinus pressure and sneezing. Negative for ear pain and sore throat.   Respiratory: Negative for shortness of breath.   Cardiovascular: Negative for chest pain.  Musculoskeletal: Negative for myalgias.  Neurological: Negative for dizziness, tremors and headaches.  Endo/Heme/Allergies: Negative for polydipsia.      Objective  Vitals:   08/25/17 0950  BP: 126/68  Pulse: 83  Resp: 14  Temp: 98.3 F (36.8 C)  TempSrc: Oral  SpO2: 97%  Weight: 174 lb (78.9 kg)  Height: 5\' 1"  (1.549 m)    Physical Exam  Constitutional: She is oriented to person, place, and time and well-developed, well-nourished, and  in no distress.  HENT:  Head: Normocephalic and atraumatic.  Nose: Right sinus exhibits maxillary sinus tenderness and frontal sinus tenderness. Left sinus exhibits maxillary sinus tenderness and frontal sinus tenderness.  Mouth/Throat: Uvula is midline and oropharynx is clear and moist.  Right and left ear canals are narrowed, not able to visualize TM, no discharge.   Cardiovascular: Normal rate, regular rhythm, S1 normal, S2 normal and normal heart sounds.  Pulmonary/Chest: Breath sounds normal. She has no wheezes. She has no rhonchi.  Neurological: She is alert and oriented to person, place, and time.  Psychiatric: Mood, memory, affect and judgment normal.  Nursing note and vitals reviewed.   Recent Results (from the past 2160 hour(s))  POCT Glucose (CBG)     Status: Abnormal   Collection Time: 08/25/17  9:52 AM  Result Value Ref Range   POC Glucose 132 (A) 70 - 99 mg/dl  POCT HgB A1C     Status: Abnormal   Collection Time: 08/25/17  9:55 AM  Result Value Ref Range   Hemoglobin A1C 6.0      Assessment & Plan  1. Controlled type 2 diabetes mellitus without complication, without long-term current use of insulin (HCC) Point-of-care A1c 6.0%, well-controlled diabetes - POCT HgB A1C - POCT Glucose (CBG) - metFORMIN (GLUCOPHAGE) 500 MG  tablet; Take 1 tablet (500 mg total) by mouth 2 (two) times daily with a meal. TAKE 1 TABLET TWICE DAILY WITH A MEAL  Dispense: 180 tablet; Refill: 0 - sitaGLIPtin (JANUVIA) 50 MG tablet; Take 1 tablet (50 mg total) by mouth daily.  Dispense: 90 tablet; Refill: 0 - Urine Microalbumin w/creat. ratio  2. Benign hypertension BP stable on present anti-hypertensive treatment - lisinopril (PRINIVIL,ZESTRIL) 20 MG tablet; Take 1 tablet (20 mg total) by mouth daily.  Dispense: 90 tablet; Refill: 1  3. Hypothyroidism, unspecified type  - SYNTHROID 112 MCG tablet; Take 1 tablet (112 mcg total) by mouth daily.  Dispense: 90 tablet; Refill: 1  4. Moderate episode of recurrent major depressive disorder (HCC)  - sertraline (ZOLOFT) 100 MG tablet; Take 1 tablet (100 mg total) by mouth daily.  Dispense: 90 tablet; Refill: 0  5. Acute non-recurrent frontal sinusitis  - amoxicillin-clavulanate (AUGMENTIN) 875-125 MG tablet; Take 1 tablet by mouth 2 (two) times daily.  Dispense: 20 tablet; Refill: 0  Myson Levi [facility] 08/25/2017 10:07 AM

## 2017-08-26 DIAGNOSIS — M5441 Lumbago with sciatica, right side: Secondary | ICD-10-CM | POA: Diagnosis not present

## 2017-08-26 LAB — MICROALBUMIN / CREATININE URINE RATIO
CREATININE, URINE: 97 mg/dL (ref 20–275)
MICROALB/CREAT RATIO: 11 ug/mg{creat} (ref <30)
Microalb, Ur: 1.1 mg/dL

## 2017-08-27 ENCOUNTER — Other Ambulatory Visit: Payer: Medicare PPO

## 2017-08-30 ENCOUNTER — Other Ambulatory Visit: Payer: Self-pay | Admitting: Orthopaedic Surgery

## 2017-09-01 ENCOUNTER — Other Ambulatory Visit: Payer: Self-pay | Admitting: Orthopaedic Surgery

## 2017-09-01 DIAGNOSIS — M5126 Other intervertebral disc displacement, lumbar region: Secondary | ICD-10-CM

## 2017-09-17 ENCOUNTER — Ambulatory Visit
Admission: RE | Admit: 2017-09-17 | Discharge: 2017-09-17 | Disposition: A | Discharge status: Home / Self Care | Payer: PPO | Source: Ambulatory Visit | Attending: Orthopaedic Surgery | Admitting: Orthopaedic Surgery

## 2017-09-17 DIAGNOSIS — M5126 Other intervertebral disc displacement, lumbar region: Secondary | ICD-10-CM

## 2017-09-17 DIAGNOSIS — M48061 Spinal stenosis, lumbar region without neurogenic claudication: Secondary | ICD-10-CM | POA: Diagnosis not present

## 2017-10-07 DIAGNOSIS — M5416 Radiculopathy, lumbar region: Secondary | ICD-10-CM | POA: Diagnosis not present

## 2017-10-07 DIAGNOSIS — M5441 Lumbago with sciatica, right side: Secondary | ICD-10-CM | POA: Diagnosis not present

## 2017-11-03 DIAGNOSIS — M5416 Radiculopathy, lumbar region: Secondary | ICD-10-CM | POA: Diagnosis not present

## 2017-11-21 ENCOUNTER — Other Ambulatory Visit: Payer: Self-pay

## 2017-11-21 DIAGNOSIS — E7800 Pure hypercholesterolemia, unspecified: Secondary | ICD-10-CM

## 2017-11-21 NOTE — Telephone Encounter (Signed)
Refill Request for Cholesterol medication. Atorvastatin to [facility].   Last visit: 08/25/2017   Lab Results  Component Value Date   CHOL 104 01/27/2017   HDL 38 (L) 01/27/2017   LDLCALC 50 01/27/2017   TRIG 82 01/27/2017   CHOLHDL 2.7 01/27/2017    Follow up on 11/24/2017

## 2017-11-24 ENCOUNTER — Ambulatory Visit (INDEPENDENT_AMBULATORY_CARE_PROVIDER_SITE_OTHER): Payer: PPO | Admitting: Family Medicine

## 2017-11-24 ENCOUNTER — Encounter: Payer: Self-pay | Admitting: Family Medicine

## 2017-11-24 VITALS — BP 126/78 | HR 78 | Temp 97.9°F | Resp 18 | Ht 61.0 in | Wt 174.4 lb

## 2017-11-24 DIAGNOSIS — G4709 Other insomnia: Secondary | ICD-10-CM | POA: Diagnosis not present

## 2017-11-24 DIAGNOSIS — I10 Essential (primary) hypertension: Secondary | ICD-10-CM

## 2017-11-24 DIAGNOSIS — F331 Major depressive disorder, recurrent, moderate: Secondary | ICD-10-CM

## 2017-11-24 DIAGNOSIS — E039 Hypothyroidism, unspecified: Secondary | ICD-10-CM

## 2017-11-24 DIAGNOSIS — E1129 Type 2 diabetes mellitus with other diabetic kidney complication: Secondary | ICD-10-CM

## 2017-11-24 DIAGNOSIS — C50412 Malignant neoplasm of upper-outer quadrant of left female breast: Secondary | ICD-10-CM | POA: Diagnosis not present

## 2017-11-24 DIAGNOSIS — Z170 Estrogen receptor positive status [ER+]: Secondary | ICD-10-CM

## 2017-11-24 DIAGNOSIS — E1169 Type 2 diabetes mellitus with other specified complication: Secondary | ICD-10-CM | POA: Diagnosis not present

## 2017-11-24 DIAGNOSIS — E538 Deficiency of other specified B group vitamins: Secondary | ICD-10-CM

## 2017-11-24 DIAGNOSIS — R809 Proteinuria, unspecified: Secondary | ICD-10-CM | POA: Diagnosis not present

## 2017-11-24 DIAGNOSIS — E785 Hyperlipidemia, unspecified: Secondary | ICD-10-CM

## 2017-11-24 DIAGNOSIS — M816 Localized osteoporosis [Lequesne]: Secondary | ICD-10-CM | POA: Diagnosis not present

## 2017-11-24 LAB — POCT GLYCOSYLATED HEMOGLOBIN (HGB A1C): Hemoglobin A1C: 6.3

## 2017-11-24 MED ORDER — LISINOPRIL 20 MG PO TABS: 20.0000 mg | ORAL_TABLET | Freq: Every day | ORAL | 1 refills | Status: DC

## 2017-11-24 MED ORDER — TRAZODONE HCL 50 MG PO TABS: 25.0000 mg | ORAL_TABLET | Freq: Every evening | ORAL | 1 refills | Status: DC | PRN

## 2017-11-24 MED ORDER — SERTRALINE HCL 50 MG PO TABS: 50.0000 mg | ORAL_TABLET | Freq: Every day | ORAL | 1 refills | Status: DC

## 2017-11-24 MED ORDER — ATORVASTATIN CALCIUM 40 MG PO TABS: 40.0000 mg | ORAL_TABLET | Freq: Every day | ORAL | 1 refills | Status: DC

## 2017-11-24 MED ORDER — METFORMIN HCL 850 MG PO TABS: 850.0000 mg | ORAL_TABLET | Freq: Two times a day (BID) | ORAL | 1 refills | Status: DC

## 2017-11-24 NOTE — Progress Notes (Signed)
Name: Lindsey [name]   MRN: [id number]    DOB: [birth year]   Date:11/24/2017       Progress Note  Subjective  Chief Complaint  Chief Complaint  Patient presents with  . Medication Refill    3 month F/U  . Diabetes    Does not check her sugar at home  . Hyperlipidemia  . Hypertension  . Hypothyroidism    HPI  DMII: she used to have microalbuminuria 2 years ago, but has been on ACE and is doing well, last urine micro negative. Also low HDL, on statin therapy. Denies polyphagia, polyuria or polydipsia. She does not check her glucose at home. She is taking aspirin ( discussed new guidelines -but she wants to continue medication). HgbA1C is at goal, we will change from Januvia 50 mg and Metformin 500 mg bid to Metformin 850 mg twice daily to off set cost.   Breast cancer: had on the right side in 1983, treated with lumpectomy, and had recurrence on the left  in 2017 and has been on Femara since lumpectomy . No nipple discharge, no lumps. Doing well  HTN: taking ace and denies cough or angioedema. No chest pain or palpitation.   Hypothyroidism: she has been on Synthroid 112 mcg for many years, she denies constipation, she has occasional diarrhea with loose stools. Denies dysphagia, but has noticed hair loss.   Osteoporosis: stopped Fosamax, ran out of refills, not sure of how long ago, no recent falls or fractures, we will check labs and repeat bone density test.   Morbid obesity: based on co-morbidities, discussed importance of daily physical activity  Major Depression: States stressed out because husband is sick, difficulty with transportation since she never learned how to drive. On zoloft, denies crying spells, but still feels sad. Appetite is normal. She does not sleep well.   Patient Active Problem List   Diagnosis Date Noted  . Bilateral lower extremity edema 01/16/2016  . Breast cancer, left breast ([place]) 11/05/2015  . 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 Morbid (severe)  obesity due to excess calories ([place]) 08/26/2015  . Controlled type 2 diabetes mellitus with microalbuminuria ([place]) 04/25/2015  . Hyperlipidemia 04/25/2015  . Benign hypertension 04/25/2015  . Adult hypothyroidism 04/25/2015  . At risk for falling 04/25/2015  . Major depression ([place]) 04/25/2015  . Post menopausal syndrome 04/25/2015    Past Surgical History:  Procedure Laterality Date  . BREAST BIOPSY Left 11-04-15  . BREAST BIOPSY Left 10-10-07  . CATARACT EXTRACTION Bilateral 1990  . CHOLECYSTECTOMY    . MASTECTOMY Right 1983   Dr Myrle Sheng  . MASTECTOMY Right 1983  . MASTECTOMY W/ SENTINEL NODE BIOPSY Left 12/12/2015   Procedure: MASTECTOMY WITH SENTINEL LYMPH NODE BIOPSY;  Surgeon: Christene Lye, MD;  Location: [facility] ORS;  Service: General;  Laterality: Left;    Family History  Problem Relation Age of Onset  . Kidney disease Mother   . Heart disease Father   . Stroke Sister   . Cancer Sister        breast  . Breast cancer Sister   . Stroke Brother   . Diabetes Brother   . Hypertension Brother   . Cancer Daughter 27       DCIS/lumpectomy/genetic negative  . Breast cancer Daughter     Social History   Socioeconomic History  . Marital status: Married    Spouse name: Not on file  . Number of children: 3  . Years of education: Not on file  .  Highest education level: Not on file  Occupational History  . Occupation: Retired  Scientific laboratory technician  . Financial resource strain: Not hard at all  . Food insecurity:    Worry: Never true    Inability: Never true  . Transportation needs:    Medical: No    Non-medical: No  Tobacco Use  . Smoking status: Never Smoker  . Smokeless tobacco: Never Used  . Tobacco comment: Non-smoker. Smoking cessation materials contraindicated  Substance and Sexual Activity  . Alcohol use: No    Alcohol/week: 0.0 oz  . Drug use: No  . Sexual activity: Never  Lifestyle  . Physical activity:    Days per week: 0 days    Minutes per  session: 0 min  . Stress: Rather much  Relationships  . Social connections:    Talks on phone: Once a week    Gets together: Never    Attends religious service: Never    Active member of club or organization: No    Attends meetings of clubs or organizations: Never    Relationship status: Married  . Intimate partner violence:    Fear of current or ex partner: No    Emotionally abused: No    Physically abused: No    Forced sexual activity: No  Other Topics Concern  . Not on file  Social History Narrative  . Not on file     Current Outpatient Medications:  .  aspirin 81 MG tablet, Take by mouth., Disp: , Rfl:  .  atorvastatin (LIPITOR) 40 MG tablet, Take 1 tablet (40 mg total) by mouth daily., Disp: 90 tablet, Rfl: 1 .  cholecalciferol (VITAMIN D) 1000 UNITS tablet, Take by mouth., Disp: , Rfl:  .  ketoconazole (NIZORAL) 2 % cream, APPLY TO THE SKIN TWICE A DAY TO INFRAMAMMARY AND GROIN AS DIRECTED, Disp: , Rfl: 0 .  letrozole (FEMARA) 2.5 MG tablet, Take 1 tablet (2.5 mg total) by mouth daily. (Patient taking differently: Take 2.5 mg by mouth daily. Dr. Jamal Collin), Disp: 90 tablet, Rfl: 3 .  lisinopril (PRINIVIL,ZESTRIL) 20 MG tablet, Take 1 tablet (20 mg total) by mouth daily., Disp: 90 tablet, Rfl: 1 .  naproxen sodium (ALEVE) 220 MG tablet, Take by mouth., Disp: , Rfl:  .  sertraline (ZOLOFT) 50 MG tablet, Take 1 tablet (50 mg total) by mouth daily., Disp: 90 tablet, Rfl: 1 .  SYNTHROID 112 MCG tablet, Take 1 tablet (112 mcg total) by mouth daily., Disp: 90 tablet, Rfl: 1 .  vitamin B-12 (CYANOCOBALAMIN) 1000 MCG tablet, Take 1,000 mcg by mouth every morning., Disp: , Rfl:  .  metFORMIN (GLUCOPHAGE) 850 MG tablet, Take 1 tablet (850 mg total) by mouth 2 (two) times daily with a meal., Disp: 180 tablet, Rfl: 1 .  ofloxacin (OCUFLOX) 0.3 % ophthalmic solution, instill 1 drop into both eyes four times a day for 7 days, Disp: , Rfl: 0  No Known Allergies   ROS  Constitutional:  Negative for fever or weight change.  Respiratory: Negative for cough and shortness of breath.   Cardiovascular: Negative for chest pain or palpitations.  Gastrointestinal: Negative for abdominal pain, no bowel changes.  Musculoskeletal: Negative for gait problem or joint swelling.  Skin: Negative for rash.  Neurological: Negative for dizziness or headache.  No other specific complaints in a complete review of systems (except as listed in HPI above).   Objective  Vitals:   11/24/17 1003  BP: 126/78  Pulse: 78  Resp: 18  Temp: 97.9 F (36.6 C)  TempSrc: Oral  SpO2: 96%  Weight: 174 lb 6.4 oz (79.1 kg)  Height: 5\' 1"  (1.549 m)    Body mass index is 32.95 kg/m.  Physical Exam  Constitutional: Patient appears well-developed and well-nourished. Obese  No distress.  HEENT: head atraumatic, normocephalic, pupils equal and reactive to light, neck supple, throat within normal limits Cardiovascular: Normal rate, regular rhythm and normal heart sounds.  No murmur heard.opening click heard  No BLE edema. Pulmonary/Chest: Effort normal and breath sounds normal. No respiratory distress. Abdominal: Soft.  There is no tenderness. Psychiatric: Patient has a normal mood and affect. behavior is normal. Judgment and thought content normal.  Recent Results (from the past 2160 hour(s))  POCT HgB A1C     Status: None   Collection Time: 11/24/17 10:15 AM  Result Value Ref Range   Hemoglobin A1C 6.3     Diabetic Foot Exam: Diabetic Foot Exam - Simple   Simple Foot Form Diabetic Foot exam was performed with the following findings:  Yes 11/24/2017 10:42 AM  Visual Inspection See comments:  Yes Sensation Testing Intact to touch and monofilament testing bilaterally:  Yes Pulse Check Posterior Tibialis and Dorsalis pulse intact bilaterally:  Yes Comments Thick toe nails.       PHQ2/9: Depression screen [facility] 2/9 11/24/2017 08/25/2017 07/29/2017 04/29/2017 10/28/2016  Decreased Interest 0 0 1 0 0   Down, Depressed, Hopeless 0 0 1 0 0  PHQ - 2 Score 0 0 2 0 0  Altered sleeping - - 1 - -  Tired, decreased energy - - 1 - -  Change in appetite - - 1 - -  Feeling bad or failure about yourself  - - 1 - -  Trouble concentrating - - 1 - -  Moving slowly or fidgety/restless - - 1 - -  Suicidal thoughts - - 1 - -  PHQ-9 Score - - 9 - -  Difficult doing work/chores - - Somewhat difficult - -     Fall Risk: Fall Risk  11/24/2017 08/25/2017 07/29/2017 04/29/2017 10/28/2016  Falls in the past year? No No No No No  Risk for fall due to : - - - - -  Risk for fall due to: Comment - - - - -    Functional Status Survey: Is the patient deaf or have difficulty hearing?: Yes(Bilateral hearing loss) Does the patient have difficulty seeing, even when wearing glasses/contacts?: Yes(Wears prescription lenses) Does the patient have difficulty concentrating, remembering, or making decisions?: Yes(Remembering) Does the patient have difficulty walking or climbing stairs?: Yes(Knee Pain) Does the patient have difficulty dressing or bathing?: No Does the patient have difficulty doing errands alone such as visiting a doctor's office or shopping?: No    Assessment & Plan  1. Controlled type 2 diabetes mellitus with microalbuminuria, without long-term current use of insulin (HCC)  We will stop [place] ( cost) and change metformin from 500 mg twice daily to 85 mg twice daily  - POCT HgB A1C - lisinopril (PRINIVIL,ZESTRIL) 20 MG tablet; Take 1 tablet (20 mg total) by mouth daily.  Dispense: 90 tablet; Refill: 1 - metFORMIN (GLUCOPHAGE) 850 MG tablet; Take 1 tablet (850 mg total) by mouth 2 (two) times daily with a meal.  Dispense: 180 tablet; Refill: 1  2. Benign hypertension  - lisinopril (PRINIVIL,ZESTRIL) 20 MG tablet; Take 1 tablet (20 mg total) by mouth daily.  Dispense: 90 tablet; Refill: 1 - COMPLETE METABOLIC PANEL WITH GFR - CBC with  Differential/Platelet  3. Moderate episode of recurrent major  depressive disorder ([place])  She still seems depressed but does not want to go up on dose - sertraline (ZOLOFT) 50 MG tablet; Take 1 tablet (50 mg total) by mouth daily.  Dispense: 90 tablet; Refill: 1  4. Localized osteoporosis without current pathological fracture  Used to take Fosamax but stopped  - DG Bone Density; Future - CBC with Differential/Platelet - VITAMIN D 25 Hydroxy (Vit-D Deficiency, Fractures) - TSH - Parathyroid hormone, intact (no Ca)  5. Dyslipidemia associated with type 2 diabetes mellitus (HCC)  - POCT HgB A1C - atorvastatin (LIPITOR) 40 MG tablet; Take 1 tablet (40 mg total) by mouth daily.  Dispense: 90 tablet; Refill: 1 - metFORMIN (GLUCOPHAGE) 850 MG tablet; Take 1 tablet (850 mg total) by mouth 2 (two) times daily with a meal.  Dispense: 180 tablet; Refill: 1 - Lipid panel  6. Adult hypothyroidism  - TSH  7. B12 deficiency  - Vitamin B12  8. Morbid obesity ([place])  She has DM, HTN and BMI above 32 , explained importance of weight loss  9. Malignant neoplasm of upper-outer quadrant of left breast in female, estrogen receptor positive ([place])  Doing well, used to see Dr. Jamal Collin on Femara for the past 2 years.   10. Other insomnia  - traZODone (DESYREL) 50 MG tablet; Take 0.5-1 tablets (25-50 mg total) by mouth at bedtime as needed for sleep.  Dispense: 90 tablet; Refill: 1

## 2017-11-25 LAB — CBC WITH DIFFERENTIAL/PLATELET
BASOS ABS: 50 {cells}/uL (ref 0–200)
Basophils Relative: 0.9 %
Eosinophils Absolute: 187 cells/uL (ref 15–500)
Eosinophils Relative: 3.4 %
HEMATOCRIT: 38.5 % (ref 35.0–45.0)
Hemoglobin: 13.3 g/dL (ref 11.7–15.5)
LYMPHS ABS: 1705 {cells}/uL (ref 850–3900)
MCH: 29.8 pg (ref 27.0–33.0)
MCHC: 34.5 g/dL (ref 32.0–36.0)
MCV: 86.1 fL (ref 80.0–100.0)
MPV: 12 fL (ref 7.5–12.5)
Monocytes Relative: 7.4 %
NEUTROS PCT: 57.3 %
Neutro Abs: 3152 cells/uL (ref 1500–7800)
Platelets: 180 10*3/uL (ref 140–400)
RBC: 4.47 10*6/uL (ref 3.80–5.10)
RDW: 13.3 % (ref 11.0–15.0)
TOTAL LYMPHOCYTE: 31 %
WBC: 5.5 10*3/uL (ref 3.8–10.8)
WBCMIX: 407 {cells}/uL (ref 200–950)

## 2017-11-25 LAB — COMPLETE METABOLIC PANEL WITH GFR
AG Ratio: 2 (calc) (ref 1.0–2.5)
ALT: 14 U/L (ref 6–29)
AST: 15 U/L (ref 10–35)
Albumin: 4.6 g/dL (ref 3.6–5.1)
Alkaline phosphatase (APISO): 58 U/L (ref 33–130)
BILIRUBIN TOTAL: 0.9 mg/dL (ref 0.2–1.2)
BUN: 18 mg/dL (ref 7–25)
CALCIUM: 9.7 mg/dL (ref 8.6–10.4)
CHLORIDE: 105 mmol/L (ref 98–110)
CO2: 32 mmol/L (ref 20–32)
Creat: 0.84 mg/dL (ref 0.60–0.93)
GFR, EST NON AFRICAN AMERICAN: 67 mL/min/{1.73_m2} (ref >=60)
GFR, Est African American: 78 mL/min/{1.73_m2} (ref >=60)
GLUCOSE: 130 mg/dL — AB (ref 65–99)
Globulin: 2.3 g/dL (calc) (ref 1.9–3.7)
Potassium: 4.1 mmol/L (ref 3.5–5.3)
Sodium: 144 mmol/L (ref 135–146)
TOTAL PROTEIN: 6.9 g/dL (ref 6.1–8.1)

## 2017-11-25 LAB — LIPID PANEL
Cholesterol: 134 mg/dL (ref <200)
HDL: 49 mg/dL — AB (ref >50)
LDL CHOLESTEROL (CALC): 66 mg/dL
Non-HDL Cholesterol (Calc): 85 mg/dL (calc) (ref <130)
TRIGLYCERIDES: 107 mg/dL (ref <150)
Total CHOL/HDL Ratio: 2.7 (calc) (ref <5.0)

## 2017-11-25 LAB — PARATHYROID HORMONE, INTACT (NO CA): PTH: 28 pg/mL (ref 14–64)

## 2017-11-25 LAB — TSH: TSH: 1.38 mIU/L (ref 0.40–4.50)

## 2017-11-25 LAB — VITAMIN B12: Vitamin B-12: 1067 pg/mL (ref 200–1100)

## 2017-11-25 LAB — VITAMIN D 25 HYDROXY (VIT D DEFICIENCY, FRACTURES): VIT D 25 HYDROXY: 40 ng/mL (ref 30–100)

## 2017-12-13 DIAGNOSIS — M5416 Radiculopathy, lumbar region: Secondary | ICD-10-CM | POA: Diagnosis not present

## 2017-12-21 ENCOUNTER — Other Ambulatory Visit: Payer: Self-pay | Admitting: *Deleted

## 2017-12-21 MED ORDER — LETROZOLE 2.5 MG PO TABS: 2.5000 mg | ORAL_TABLET | Freq: Every day | ORAL | 12 refills | Status: DC

## 2018-01-05 DIAGNOSIS — M5416 Radiculopathy, lumbar region: Secondary | ICD-10-CM | POA: Diagnosis not present

## 2018-01-26 ENCOUNTER — Other Ambulatory Visit: Payer: PPO

## 2018-02-07 ENCOUNTER — Ambulatory Visit
Admission: RE | Admit: 2018-02-07 | Discharge: 2018-02-07 | Disposition: A | Discharge status: Home / Self Care | Payer: PPO | Source: Ambulatory Visit | Attending: Family Medicine | Admitting: Family Medicine

## 2018-02-07 DIAGNOSIS — M810 Age-related osteoporosis without current pathological fracture: Secondary | ICD-10-CM | POA: Diagnosis not present

## 2018-02-07 DIAGNOSIS — Z780 Asymptomatic menopausal state: Secondary | ICD-10-CM | POA: Diagnosis not present

## 2018-02-07 DIAGNOSIS — M816 Localized osteoporosis [Lequesne]: Secondary | ICD-10-CM | POA: Diagnosis not present

## 2018-02-15 DIAGNOSIS — M79644 Pain in right finger(s): Secondary | ICD-10-CM | POA: Diagnosis not present

## 2018-02-15 DIAGNOSIS — M79645 Pain in left finger(s): Secondary | ICD-10-CM | POA: Diagnosis not present

## 2018-02-16 DIAGNOSIS — M1711 Unilateral primary osteoarthritis, right knee: Secondary | ICD-10-CM | POA: Diagnosis not present

## 2018-02-16 DIAGNOSIS — M1712 Unilateral primary osteoarthritis, left knee: Secondary | ICD-10-CM | POA: Diagnosis not present

## 2018-03-24 DIAGNOSIS — M1712 Unilateral primary osteoarthritis, left knee: Secondary | ICD-10-CM | POA: Diagnosis not present

## 2018-03-24 DIAGNOSIS — M1711 Unilateral primary osteoarthritis, right knee: Secondary | ICD-10-CM | POA: Diagnosis not present

## 2018-03-27 ENCOUNTER — Ambulatory Visit: Payer: PPO | Admitting: Family Medicine

## 2018-03-28 ENCOUNTER — Encounter: Payer: Self-pay | Admitting: Family Medicine

## 2018-03-28 ENCOUNTER — Ambulatory Visit (INDEPENDENT_AMBULATORY_CARE_PROVIDER_SITE_OTHER): Payer: PPO | Admitting: Family Medicine

## 2018-03-28 DIAGNOSIS — R809 Proteinuria, unspecified: Secondary | ICD-10-CM

## 2018-03-28 DIAGNOSIS — F331 Major depressive disorder, recurrent, moderate: Secondary | ICD-10-CM

## 2018-03-28 DIAGNOSIS — M810 Age-related osteoporosis without current pathological fracture: Secondary | ICD-10-CM

## 2018-03-28 DIAGNOSIS — Z9181 History of falling: Secondary | ICD-10-CM

## 2018-03-28 DIAGNOSIS — I10 Essential (primary) hypertension: Secondary | ICD-10-CM

## 2018-03-28 DIAGNOSIS — E039 Hypothyroidism, unspecified: Secondary | ICD-10-CM | POA: Diagnosis not present

## 2018-03-28 DIAGNOSIS — C50412 Malignant neoplasm of upper-outer quadrant of left female breast: Secondary | ICD-10-CM

## 2018-03-28 DIAGNOSIS — E7800 Pure hypercholesterolemia, unspecified: Secondary | ICD-10-CM | POA: Diagnosis not present

## 2018-03-28 DIAGNOSIS — E1129 Type 2 diabetes mellitus with other diabetic kidney complication: Secondary | ICD-10-CM

## 2018-03-28 NOTE — Progress Notes (Addendum)
Name: Lindsey [name]   MRN: [id number]    DOB: November 17, 1940   Date:03/28/2018       Progress Note  Subjective  Chief Complaint  Chief Complaint  Patient presents with  . Follow-up    4 month recheck  . Hypertension  . Diabetes  . Hyperlipidemia    HPI  DMII: Urine micro is UTD - was normal 08/25/2017, has been on ACE and is doing well.. Also low HDL, on statin therapy (atorvastatin). Denies polyphagia, polyuria or polydipsia. She does not check her glucose at home. She is taking aspirin ( discussed new guidelines -but she wants to continue medication). She is taking Metformin 850mg  BID because Januvia was too expensive.  We will recheck CMP and A1C today.  Breast cancer: had on the right side in 1983, treated with lumpectomy, and had recurrence on the left  in 2017 and has been on Femara since mastectomy - she has had bilateral mastectomy. No nipple discharge, no lumps. Doing well on this.  Was followed by Dr. Jamal Collin - needs follow up to be scheduled - we will provide this referral today.  HTN: taking ace and denies cough or angioedema. No chest pain or palpitation, lightheadedness, dizziness, vision changes, headache. She denies BLE swelling at this time.  Hypothyroidism: she has been on Synthroid 112 mcg for many years, she has occasional diarrhea with loose stools; but has ongoing hair loss.  No dysphagia, heat/cold intolerance, constipation, palpitations, hair/nail changes.  Osteoporosis: stopped Fosamax, ran out of refills, not sure of how long ago, no recent falls or fractures.  Her bone density scan on 02/07/2018 showed T scores in osteopenic range of the bilateral femur and significant osteoporosis of L1-L2.  Discussed fall prevention in the home - good lighting, no loose rugs.  Will consider referral to endocrinology.  Morbid obesity: based on co-morbidities, discussed importance of daily physical activity and healthy eating habits. She verbalizes understanding but her activity  level is limited by her age and caring for her husband. Gained 6lbs since last visit.  Major Depression: States stressed out because husband is sick (he has trouble walking and is not able to do anything outside of the home), difficulty with transportation since she never learned how to drive. On zoloft, denies crying spells, but still feels sad. Appetite is normal. She reports difficulty sleeping, is not taking trazodone because she is worried she won't be able to get up to help her husband.  Discussed C3 program but she declines referral; her daughter does help with transportation.  PHQ-9 is 7 today - 2 point improvement from last visit.  Colorectal Cancer Screening: Has cologuard at her home, just needs to complete the screening.  Was Rx'd by Dr. Manuella Ghazi.  HLD: History of HLD - taking atorvastatin; last lipid panel in April 2019 showed low HDL and LDL at goal at 66.  Denies myalgias, chest pain, or shortness of breath.  Patient Active Problem List   Diagnosis Date Noted  . Bilateral lower extremity edema 01/16/2016  . Breast cancer, left breast ([place]) 11/05/2015  . 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 Morbid (severe) obesity due to excess calories ([place]) 08/26/2015  . Controlled type 2 diabetes mellitus with microalbuminuria ([place]) 04/25/2015  . Hyperlipidemia 04/25/2015  . Benign hypertension 04/25/2015  . Adult hypothyroidism 04/25/2015  . At risk for falling 04/25/2015  . Major depression ([place]) 04/25/2015  . Post menopausal syndrome 04/25/2015    Past Surgical History:  Procedure Laterality Date  . BREAST BIOPSY  Left 11-04-15  . BREAST BIOPSY Left 10-10-07  . CATARACT EXTRACTION Bilateral 1990  . CHOLECYSTECTOMY    . MASTECTOMY Right 1983   Dr Myrle Sheng  . MASTECTOMY Right 1983  . MASTECTOMY W/ SENTINEL NODE BIOPSY Left 12/12/2015   Procedure: MASTECTOMY WITH SENTINEL LYMPH NODE BIOPSY;  Surgeon: Christene Lye, MD;  Location: [facility] ORS;  Service: General;  Laterality:  Left;    Family History  Problem Relation Age of Onset  . Kidney disease Mother   . Heart disease Father   . Stroke Sister   . Cancer Sister        breast  . Breast cancer Sister   . Stroke Brother   . Diabetes Brother   . Hypertension Brother   . Cancer Daughter 1       DCIS/lumpectomy/genetic negative  . Breast cancer Daughter     Social History   Socioeconomic History  . Marital status: Married    Spouse name: Not on file  . Number of children: 3  . Years of education: Not on file  . Highest education level: Not on file  Occupational History  . Occupation: Retired  Scientific laboratory technician  . Financial resource strain: Not hard at all  . Food insecurity:    Worry: Never true    Inability: Never true  . Transportation needs:    Medical: No    Non-medical: No  Tobacco Use  . Smoking status: Never Smoker  . Smokeless tobacco: Never Used  . Tobacco comment: Non-smoker. Smoking cessation materials contraindicated  Substance and Sexual Activity  . Alcohol use: No    Alcohol/week: 0.0 oz  . Drug use: No  . Sexual activity: Never  Lifestyle  . Physical activity:    Days per week: 0 days    Minutes per session: 0 min  . Stress: Rather much  Relationships  . Social connections:    Talks on phone: Once a week    Gets together: Never    Attends religious service: Never    Active member of club or organization: No    Attends meetings of clubs or organizations: Never    Relationship status: Married  . Intimate partner violence:    Fear of current or ex partner: No    Emotionally abused: No    Physically abused: No    Forced sexual activity: No  Other Topics Concern  . Not on file  Social History Narrative  . Not on file     Current Outpatient Medications:  .  aspirin 81 MG tablet, Take by mouth., Disp: , Rfl:  .  atorvastatin (LIPITOR) 40 MG tablet, Take 1 tablet (40 mg total) by mouth daily., Disp: 90 tablet, Rfl: 1 .  cholecalciferol (VITAMIN D) 1000 UNITS  tablet, Take by mouth., Disp: , Rfl:  .  ketoconazole (NIZORAL) 2 % cream, APPLY TO THE SKIN TWICE A DAY TO INFRAMAMMARY AND GROIN AS DIRECTED, Disp: , Rfl: 0 .  letrozole (FEMARA) 2.5 MG tablet, Take 1 tablet (2.5 mg total) by mouth daily. (Patient taking differently: Take 2.5 mg by mouth daily. Dr. Jamal Collin), Disp: 90 tablet, Rfl: 3 .  letrozole (FEMARA) 2.5 MG tablet, Take 1 tablet (2.5 mg total) by mouth daily., Disp: 30 tablet, Rfl: 12 .  lisinopril (PRINIVIL,ZESTRIL) 20 MG tablet, Take 1 tablet (20 mg total) by mouth daily., Disp: 90 tablet, Rfl: 1 .  metFORMIN (GLUCOPHAGE) 850 MG tablet, Take 1 tablet (850 mg total) by mouth 2 (two) times daily  with a meal., Disp: 180 tablet, Rfl: 1 .  naproxen sodium (ALEVE) 220 MG tablet, Take by mouth., Disp: , Rfl:  .  ofloxacin (OCUFLOX) 0.3 % ophthalmic solution, instill 1 drop into both eyes four times a day for 7 days, Disp: , Rfl: 0 .  sertraline (ZOLOFT) 50 MG tablet, Take 1 tablet (50 mg total) by mouth daily., Disp: 90 tablet, Rfl: 1 .  SYNTHROID 112 MCG tablet, Take 1 tablet (112 mcg total) by mouth daily., Disp: 90 tablet, Rfl: 1 .  traZODone (DESYREL) 50 MG tablet, Take 0.5-1 tablets (25-50 mg total) by mouth at bedtime as needed for sleep., Disp: 90 tablet, Rfl: 1 .  vitamin B-12 (CYANOCOBALAMIN) 1000 MCG tablet, Take 1,000 mcg by mouth every morning., Disp: , Rfl:   No Known Allergies  ROS Constitutional: Negative for fever; positive for weight gain.  Respiratory: Negative for cough and shortness of breath.   Cardiovascular: Negative for chest pain or palpitations.  Gastrointestinal: Negative for abdominal pain, no bowel changes.  Musculoskeletal: Negative for gait problem or joint swelling.  Skin: Negative for rash.  Neurological: Negative for dizziness or headache.  No other specific complaints in a complete review of systems (except as listed in HPI above).  Objective  Vitals:   03/28/18 1000  BP: 130/72  Pulse: 77  Resp: 14    Temp: 98 F (36.7 C)  TempSrc: Oral  SpO2: 97%  Weight: 180 lb 3.2 oz (81.7 kg)  Height: 5\' 1"  (1.549 m)   Body mass index is 34.05 kg/m.  Physical Exam Constitutional: Patient appears well-developed and well-nourished. No distress.  HENT: Head: Normocephalic and atraumatic.  Nose: Nose normal. Mouth/Throat: Oropharynx is clear and moist. No oropharyngeal exudate.  Eyes: Conjunctivae and EOM are normal. Pupils are equal, round, and reactive to light. No scleral icterus.  Neck: Normal range of motion. Neck supple. No JVD present.  Cardiovascular: Normal rate, regular rhythm and normal heart sounds.  No murmur heard. No BLE edema. Pulmonary/Chest: Effort normal and breath sounds normal. No respiratory distress. Musculoskeletal: Normal range of motion, no joint effusions. No gross deformities Neurological: she is alert and oriented to person, place, and time. No cranial nerve deficit. Coordination, balance, strength, speech and gait are baseline.  Skin: Skin is warm and dry. No rash noted. No erythema.  Psychiatric: Patient has a normal mood and affect. behavior is normal. Judgment and thought content normal.  No results found for this or any previous visit (from the past 72 hour(s)).   PHQ2/9: Depression screen [facility] 2/9 03/28/2018 11/24/2017 08/25/2017 07/29/2017 04/29/2017  Decreased Interest 0 0 0 1 0  Down, Depressed, Hopeless 0 0 0 1 0  PHQ - 2 Score 0 0 0 2 0  Altered sleeping 2 - - 1 -  Tired, decreased energy 2 - - 1 -  Change in appetite 1 - - 1 -  Feeling bad or failure about yourself  1 - - 1 -  Trouble concentrating 1 - - 1 -  Moving slowly or fidgety/restless 0 - - 1 -  Suicidal thoughts 0 - - 1 -  PHQ-9 Score 7 - - 9 -  Difficult doing work/chores - - - Somewhat difficult -   Fall Risk: Fall Risk  03/28/2018 11/24/2017 08/25/2017 07/29/2017 04/29/2017  Falls in the past year? No No No No No  Risk for fall due to : - - - - -  Risk for fall due to: Comment - - - - -  Assessment  & Plan  1. Morbid (severe) obesity due to excess calories ([place]) - Discussed importance of 150 minutes of physical activity weekly, eat two servings of fish weekly, eat one serving of tree nuts ( cashews, pistachios, pecans, almonds.[name]) every other day, eat 6 servings of fruit/vegetables daily and drink plenty of water and avoid sweet beverages.   2. Osteoporosis, post-menopausal - Will discuss potentially re-starting fosamax vs endocrinology with PCP Dr. Ancil Boozer. - Continue Vitamin D supplementations.  3. Malignant neoplasm of upper-outer quadrant of left female breast, unspecified estrogen receptor status ([place]) - Ambulatory referral to General Surgery  4. Moderate episode of recurrent major depressive disorder ([place]) - Continue Zoloft; recommend she take trazodone PRN for sleep, [place] start with 1/2 tablet.  She will try to utilize her daughter more for evening/nighttime care to allow her to rest.  Declines C3 referral  5. Adult hypothyroidism - Stable; we will recheck in 3 months as she denies any changes in symptoms.  6. Controlled type 2 diabetes mellitus with microalbuminuria, without long-term current use of insulin (HCC) - Hemoglobin A1c - COMPLETE METABOLIC PANEL WITH GFR  7. Benign hypertension - COMPLETE METABOLIC PANEL WITH GFR - At goal, continue lisinopril daily.  8. At risk for falling - Discussed fall prevention.  9. Pure hypercholesterolemia - Continue atorvastatin - Discussed aspirin guidelines, but she would like to continue taking mediciation at this time.  Face-to-face time with patient was more than 25 minutes, >50% time spent counseling and coordination of care

## 2018-03-29 ENCOUNTER — Other Ambulatory Visit: Payer: Self-pay | Admitting: Family Medicine

## 2018-03-29 DIAGNOSIS — M810 Age-related osteoporosis without current pathological fracture: Secondary | ICD-10-CM

## 2018-03-29 LAB — COMPLETE METABOLIC PANEL WITH GFR
AG Ratio: 2 (calc) (ref 1.0–2.5)
ALKALINE PHOSPHATASE (APISO): 53 U/L (ref 33–130)
ALT: 18 U/L (ref 6–29)
AST: 17 U/L (ref 10–35)
Albumin: 4.5 g/dL (ref 3.6–5.1)
BUN: 24 mg/dL (ref 7–25)
CO2: 29 mmol/L (ref 20–32)
CREATININE: 0.75 mg/dL (ref 0.60–0.93)
Calcium: 9.4 mg/dL (ref 8.6–10.4)
Chloride: 106 mmol/L (ref 98–110)
GFR, Est African American: 88 mL/min/{1.73_m2} (ref >=60)
GFR, Est Non African American: 76 mL/min/{1.73_m2} (ref >=60)
GLUCOSE: 161 mg/dL — AB (ref 65–99)
Globulin: 2.2 g/dL (calc) (ref 1.9–3.7)
Potassium: 4 mmol/L (ref 3.5–5.3)
Sodium: 144 mmol/L (ref 135–146)
Total Bilirubin: 0.6 mg/dL (ref 0.2–1.2)
Total Protein: 6.7 g/dL (ref 6.1–8.1)

## 2018-03-29 LAB — HEMOGLOBIN A1C
EAG (MMOL/L): 8.7 (calc)
Hgb A1c MFr Bld: 7.1 % of total Hgb — ABNORMAL HIGH (ref <5.7)
Mean Plasma Glucose: 157 (calc)

## 2018-03-29 NOTE — Progress Notes (Signed)
Please call patient and let Lindsey [name] know I am referring Lindsey [name] to endocrinology after reviewing Lindsey [name] dexa scan with Dr. Ancil Boozer.  It is very important that she make this appointment because Lindsey [name] bone density scan showed significant osteoporosis.

## 2018-03-29 NOTE — Progress Notes (Signed)
Left message for patient

## 2018-04-10 DIAGNOSIS — M810 Age-related osteoporosis without current pathological fracture: Secondary | ICD-10-CM | POA: Diagnosis not present

## 2018-04-17 ENCOUNTER — Other Ambulatory Visit: Payer: Self-pay

## 2018-04-17 DIAGNOSIS — Z170 Estrogen receptor positive status [ER+]: Principal | ICD-10-CM

## 2018-04-17 DIAGNOSIS — C50412 Malignant neoplasm of upper-outer quadrant of left female breast: Secondary | ICD-10-CM

## 2018-04-22 DIAGNOSIS — M1712 Unilateral primary osteoarthritis, left knee: Secondary | ICD-10-CM | POA: Diagnosis not present

## 2018-04-22 DIAGNOSIS — M1711 Unilateral primary osteoarthritis, right knee: Secondary | ICD-10-CM | POA: Diagnosis not present

## 2018-05-01 DIAGNOSIS — M1711 Unilateral primary osteoarthritis, right knee: Secondary | ICD-10-CM | POA: Diagnosis not present

## 2018-05-01 DIAGNOSIS — M1712 Unilateral primary osteoarthritis, left knee: Secondary | ICD-10-CM | POA: Diagnosis not present

## 2018-05-17 DIAGNOSIS — M1711 Unilateral primary osteoarthritis, right knee: Secondary | ICD-10-CM | POA: Diagnosis not present

## 2018-05-17 DIAGNOSIS — M1712 Unilateral primary osteoarthritis, left knee: Secondary | ICD-10-CM | POA: Diagnosis not present

## 2018-05-22 ENCOUNTER — Other Ambulatory Visit: Payer: Self-pay | Admitting: Family Medicine

## 2018-05-22 ENCOUNTER — Other Ambulatory Visit: Payer: Self-pay

## 2018-05-22 DIAGNOSIS — E1169 Type 2 diabetes mellitus with other specified complication: Secondary | ICD-10-CM

## 2018-05-22 DIAGNOSIS — E785 Hyperlipidemia, unspecified: Principal | ICD-10-CM

## 2018-05-22 DIAGNOSIS — E039 Hypothyroidism, unspecified: Secondary | ICD-10-CM

## 2018-05-22 NOTE — Telephone Encounter (Signed)
Refill request for general medication. Synthroid to [facility].   Last office visit 03/28/2018   Follow up on 06/28/2018

## 2018-05-23 MED ORDER — SYNTHROID 112 MCG PO TABS: 112.0000 ug | ORAL_TABLET | Freq: Every day | ORAL | 1 refills | Status: DC

## 2018-05-25 ENCOUNTER — Ambulatory Visit: Payer: PPO | Admitting: General Surgery

## 2018-06-12 DIAGNOSIS — M1711 Unilateral primary osteoarthritis, right knee: Secondary | ICD-10-CM | POA: Diagnosis not present

## 2018-06-20 DIAGNOSIS — Z7982 Long term (current) use of aspirin: Secondary | ICD-10-CM | POA: Diagnosis not present

## 2018-06-20 DIAGNOSIS — M2241 Chondromalacia patellae, right knee: Secondary | ICD-10-CM | POA: Diagnosis not present

## 2018-06-20 DIAGNOSIS — M170 Bilateral primary osteoarthritis of knee: Secondary | ICD-10-CM | POA: Diagnosis not present

## 2018-06-20 DIAGNOSIS — I10 Essential (primary) hypertension: Secondary | ICD-10-CM | POA: Diagnosis not present

## 2018-06-20 DIAGNOSIS — M7061 Trochanteric bursitis, right hip: Secondary | ICD-10-CM | POA: Diagnosis not present

## 2018-06-20 DIAGNOSIS — Z9181 History of falling: Secondary | ICD-10-CM | POA: Diagnosis not present

## 2018-06-20 DIAGNOSIS — E119 Type 2 diabetes mellitus without complications: Secondary | ICD-10-CM | POA: Diagnosis not present

## 2018-06-20 DIAGNOSIS — M47816 Spondylosis without myelopathy or radiculopathy, lumbar region: Secondary | ICD-10-CM | POA: Diagnosis not present

## 2018-06-20 DIAGNOSIS — Z7984 Long term (current) use of oral hypoglycemic drugs: Secondary | ICD-10-CM | POA: Diagnosis not present

## 2018-06-20 DIAGNOSIS — M2242 Chondromalacia patellae, left knee: Secondary | ICD-10-CM | POA: Diagnosis not present

## 2018-06-27 ENCOUNTER — Ambulatory Visit (INDEPENDENT_AMBULATORY_CARE_PROVIDER_SITE_OTHER): Payer: PPO | Admitting: General Surgery

## 2018-06-27 ENCOUNTER — Other Ambulatory Visit: Payer: Self-pay

## 2018-06-27 ENCOUNTER — Encounter: Payer: Self-pay | Admitting: General Surgery

## 2018-06-27 VITALS — BP 160/82 | HR 72 | Temp 97.9°F | Resp 14 | Ht 61.0 in | Wt 188.6 lb

## 2018-06-27 DIAGNOSIS — Z853 Personal history of malignant neoplasm of breast: Secondary | ICD-10-CM

## 2018-06-27 NOTE — Patient Instructions (Addendum)
Patient is to return to the office in 1 year. Continue to take medication, call the office with any questions or concerns.

## 2018-06-27 NOTE — Progress Notes (Signed)
Patient ID: Lindsey [name], female   DOB: 17-Dec-1940, 77 y.o.   MRN: [id number]  Chief Complaint  Patient presents with  . Follow-up     one year f/u breast ca recall no mammos    HPI Lindsey [name] is a 77 y.o. female here today to follow up for 1 year breast cancer. Patient states she is feeling well with a few aches and pains on the right side in the breast area.  HPI  Past Medical History:  Diagnosis Date  . Arthritis   . Breast cancer ([place]) 1983   right breast cancer  . 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 Bursitis    R knee  . Depression   . Diabetes mellitus type 2, controlled, without complications ([place]) 0/0/9381  . Diabetes mellitus without complication ([place])   . Hyperlipidemia   . Hypertension   . Hypothyroidism   . Osteoporosis   . 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 Thyroid disease     Past Surgical History:  Procedure Laterality Date  . BREAST BIOPSY Left 11-04-15  . BREAST BIOPSY Left 10-10-07  . CATARACT EXTRACTION Bilateral 1990  . CHOLECYSTECTOMY    . MASTECTOMY Right 1983   Dr Myrle Sheng  . MASTECTOMY Right 1983  . MASTECTOMY W/ SENTINEL NODE BIOPSY Left 12/12/2015   Procedure: MASTECTOMY WITH SENTINEL LYMPH NODE BIOPSY;  Surgeon: Christene Lye, MD;  Location: [facility] ORS;  Service: General;  Laterality: Left;    Family History  Problem Relation Age of Onset  . Kidney disease Mother   . Heart disease Father   . Stroke Sister   . Cancer Sister        breast  . Breast cancer Sister   . Stroke Brother   . Diabetes Brother   . Hypertension Brother   . Cancer Daughter 75       DCIS/lumpectomy/genetic negative  . Breast cancer Daughter     Social History Social History   Tobacco Use  . Smoking status: Never Smoker  . Smokeless tobacco: Never Used  . Tobacco comment: Non-smoker. Smoking cessation materials contraindicated  Substance Use Topics  . Alcohol  use: No    Alcohol/week: 0.0 standard drinks  . Drug use: No    No Known Allergies  Current Outpatient Medications  Medication Sig Dispense Refill  . aspirin 81 MG tablet Take by mouth.    . cholecalciferol (VITAMIN D) 1000 UNITS tablet Take by mouth.    [name] ketoconazole (NIZORAL) 2 % cream APPLY TO THE SKIN TWICE A DAY TO INFRAMAMMARY AND GROIN AS DIRECTED  0  . letrozole (FEMARA) 2.5 MG tablet Take 1 tablet (2.5 mg total) by mouth daily. 30 tablet 12  . naproxen sodium (ALEVE) 220 MG tablet Take by mouth.    . SYNTHROID 112 MCG tablet Take 1 tablet (112 mcg total) by mouth daily. 90 tablet 1  . traZODone (DESYREL) 50 MG tablet Take 0.5-1 tablets (25-50 mg total) by mouth at bedtime as needed for sleep. 90 tablet 1  . vitamin B-12 (CYANOCOBALAMIN) 1000 MCG tablet Take 1,000 mcg by mouth every morning.    [name] alendronate (FOSAMAX) 70 MG tablet Take 1 tablet (70 mg total) by mouth every 7 (seven) days. Take with a full glass of water on an empty stomach. 12 tablet 3  . atorvastatin (LIPITOR) 40 MG tablet Take 1 tablet (40 mg total) by mouth  daily. 90 tablet 1  . lisinopril (PRINIVIL,ZESTRIL) 20 MG tablet Take 1 tablet (20 mg total) by mouth daily. 90 tablet 1  . metFORMIN (GLUCOPHAGE) 850 MG tablet Take 1 tablet (850 mg total) by mouth 2 (two) times daily with a meal. 180 tablet 1  . sertraline (ZOLOFT) 100 MG tablet Take 1 tablet (100 mg total) by mouth daily. 90 tablet 0   No current facility-administered medications for this visit.     Review of Systems Review of Systems  Constitutional: Negative.   Respiratory: Negative.   Cardiovascular: Negative.     Blood pressure (!) 160/82, pulse 72, temperature 97.9 F (36.6 C), temperature source Temporal, resp. rate 14, height 5\' 1"  (1.549 m), weight 188 lb 9.6 oz (85.5 kg), SpO2 92 %.  Physical Exam Physical Exam  Constitutional: She is oriented to person, place, and time. She appears well-developed and well-nourished.  Eyes:  Conjunctivae are normal. No scleral icterus.  Neck: Normal range of motion.  Cardiovascular: Normal rate, regular rhythm and normal heart sounds.  Pulmonary/Chest: Effort normal and breath sounds normal. Right breast exhibits no inverted nipple, no mass, no nipple discharge, no skin change and no tenderness. Left breast exhibits no inverted nipple, no mass, no nipple discharge, no skin change and no tenderness. Breasts are symmetrical.    Lymphadenopathy:    She has no cervical adenopathy.  Neurological: She is alert and oriented to person, place, and time.  Skin: Skin is warm and dry.    Data Reviewed No imaging studies.  Assessment    No evidence of recurrent cancer.    Plan Patient is to return to the office in 1 year. Continue to take medication, call the office with any questions or concerns. HPI, Physical Exam, Assessment and Plan have been scribed under the direction and in the presence of Hervey Ard, Md.  Eudelia Bunch R. Bobette Mo, CMA  I have completed the exam and reviewed the above documentation for accuracy and completeness.  I agree with the above.  Haematologist has been used and any errors in dictation or transcription are unintentional.  Hervey Ard, M.D., F.A.C.S.  [place] Gleason Donell Tomkins 06/28/2018, 5:03 PM

## 2018-06-28 ENCOUNTER — Encounter: Payer: Self-pay | Admitting: Family Medicine

## 2018-06-28 ENCOUNTER — Ambulatory Visit (INDEPENDENT_AMBULATORY_CARE_PROVIDER_SITE_OTHER): Payer: PPO | Admitting: Family Medicine

## 2018-06-28 VITALS — BP 146/86 | HR 83 | Temp 97.7°F | Resp 16 | Ht 61.0 in | Wt 185.5 lb

## 2018-06-28 DIAGNOSIS — Z9189 Other specified personal risk factors, not elsewhere classified: Secondary | ICD-10-CM | POA: Diagnosis not present

## 2018-06-28 DIAGNOSIS — M170 Bilateral primary osteoarthritis of knee: Secondary | ICD-10-CM

## 2018-06-28 DIAGNOSIS — Z5982 Transportation insecurity: Secondary | ICD-10-CM

## 2018-06-28 DIAGNOSIS — I10 Essential (primary) hypertension: Secondary | ICD-10-CM

## 2018-06-28 DIAGNOSIS — E1129 Type 2 diabetes mellitus with other diabetic kidney complication: Secondary | ICD-10-CM | POA: Diagnosis not present

## 2018-06-28 DIAGNOSIS — F331 Major depressive disorder, recurrent, moderate: Secondary | ICD-10-CM

## 2018-06-28 DIAGNOSIS — E785 Hyperlipidemia, unspecified: Secondary | ICD-10-CM

## 2018-06-28 DIAGNOSIS — E1169 Type 2 diabetes mellitus with other specified complication: Secondary | ICD-10-CM

## 2018-06-28 DIAGNOSIS — R198 Other specified symptoms and signs involving the digestive system and abdomen: Secondary | ICD-10-CM

## 2018-06-28 DIAGNOSIS — E039 Hypothyroidism, unspecified: Secondary | ICD-10-CM

## 2018-06-28 DIAGNOSIS — C50412 Malignant neoplasm of upper-outer quadrant of left female breast: Secondary | ICD-10-CM

## 2018-06-28 DIAGNOSIS — Z23 Encounter for immunization: Secondary | ICD-10-CM | POA: Diagnosis not present

## 2018-06-28 DIAGNOSIS — R809 Proteinuria, unspecified: Secondary | ICD-10-CM

## 2018-06-28 DIAGNOSIS — M810 Age-related osteoporosis without current pathological fracture: Secondary | ICD-10-CM

## 2018-06-28 DIAGNOSIS — E538 Deficiency of other specified B group vitamins: Secondary | ICD-10-CM

## 2018-06-28 DIAGNOSIS — R159 Full incontinence of feces: Secondary | ICD-10-CM

## 2018-06-28 DIAGNOSIS — Z853 Personal history of malignant neoplasm of breast: Secondary | ICD-10-CM | POA: Insufficient documentation

## 2018-06-28 MED ORDER — ALENDRONATE SODIUM 70 MG PO TABS: 70.0000 mg | ORAL_TABLET | ORAL | 3 refills | Status: DC

## 2018-06-28 MED ORDER — SERTRALINE HCL 100 MG PO TABS: 100.0000 mg | ORAL_TABLET | Freq: Every day | ORAL | 0 refills | Status: DC

## 2018-06-28 MED ORDER — METFORMIN HCL 850 MG PO TABS: 850.0000 mg | ORAL_TABLET | Freq: Two times a day (BID) | ORAL | 1 refills | Status: DC

## 2018-06-28 MED ORDER — ATORVASTATIN CALCIUM 40 MG PO TABS: 40.0000 mg | ORAL_TABLET | Freq: Every day | ORAL | 1 refills | Status: DC

## 2018-06-28 MED ORDER — LISINOPRIL 20 MG PO TABS: 20.0000 mg | ORAL_TABLET | Freq: Every day | ORAL | 1 refills | Status: DC

## 2018-06-28 NOTE — Progress Notes (Signed)
Name: Lindsey [name]   MRN: [id number]    DOB: 08-26-1940   Date:06/28/2018       Progress Note  Subjective  Chief Complaint  Chief Complaint  Patient presents with  . Medication Refill    3 month F/U  . Diabetes  . Hypertension  . Hyperlipidemia  . Hypothyroidism    HPI  DMII: she used to have microalbuminuria, but has been on ACE and is doing well, last urine micro negative. Also low HDL, on statin therapy. Denies polyphagia, polyuria or polydipsia. She does not check her glucose at home. She is taking aspirin ( discussed new guidelines -but she wants to continue medication). HgbA1C is at goal, last level 7.1% , we  changed from Januvia 50 mg and Metformin 500 mg bid to Metformin 850 mg twice daily to off set cost, continue current regiment and recheck hgbA1C on her next visit   Breast cancer: had on the right side in 1983, treated with mastectomy ,  and had recurrence on the left  in 2017 and had mastectomy of the left , she has been on Femara since, under the care of Dr. Fleet Contras   HTN: taking ace and denies cough or angioedema. No chest pain or palpitation. BP is slightly up today, we will monitor.   Hypothyroidism: she has been on Synthroid 112 mcg for many years. She has occasional dysphagia after taking medication, last TSH at goal.    Osteoporosis: last bone density showed osteoporosis, she was on fosamax but never got refills , she has been out of medication  Morbid obesity: based on co-morbidities, BMI 35 , discussed importance of daily physical activity  Major Depression: States stressed out because husband is sick, difficulty with transportation since she never learned how to drive. She has a daughter but does not come over frequently, no longer can go to church. We will adjust dose of Zoloft from 50 mg to 100 mg  Bowel incontinence: she has urgency, but sometimes unable to make it to the bathroom, [place] 6-7, going on for the past year. Accidents happens about once a  month. No blood in stools, explained referral to GI . She said she has a history of constipation, bowel movements about 3 times a week but bristol used to be a 1.    Patient Active Problem List   Diagnosis Date Noted  . Bilateral lower extremity edema 01/16/2016  . Breast cancer, left breast ([place]) 11/05/2015  . 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 Morbid (severe) obesity due to excess calories ([place]) 08/26/2015  . Controlled type 2 diabetes mellitus with microalbuminuria ([place]) 04/25/2015  . Hyperlipidemia 04/25/2015  . Benign hypertension 04/25/2015  . Adult hypothyroidism 04/25/2015  . At risk for falling 04/25/2015  . Major depression ([place]) 04/25/2015  . Post menopausal syndrome 04/25/2015    Past Surgical History:  Procedure Laterality Date  . BREAST BIOPSY Left 11-04-15  . BREAST BIOPSY Left 10-10-07  . CATARACT EXTRACTION Bilateral 1990  . CHOLECYSTECTOMY    . MASTECTOMY Right 1983   Dr Myrle Sheng  . MASTECTOMY Right 1983  . MASTECTOMY W/ SENTINEL NODE BIOPSY Left 12/12/2015   Procedure: MASTECTOMY WITH SENTINEL LYMPH NODE BIOPSY;  Surgeon: Christene Lye, MD;  Location: [facility] ORS;  Service: General;  Laterality: Left;    Family History  Problem Relation Age of Onset  . Kidney disease Mother   . Heart disease Father   . Stroke Sister   . Cancer Sister  breast  . Breast cancer Sister   . Stroke Brother   . Diabetes Brother   . Hypertension Brother   . Cancer Daughter 7       DCIS/lumpectomy/genetic negative  . Breast cancer Daughter     Social History   Socioeconomic History  . Marital status: Married    Spouse name: Not on file  . Number of children: 3  . Years of education: Not on file  . Highest education level: Not on file  Occupational History  . Occupation: Retired  Scientific laboratory technician  . Financial resource strain: Not hard at all  . Food insecurity:    Worry: Never true    Inability: Never true  . Transportation needs:    Medical: No     Non-medical: No  Tobacco Use  . Smoking status: Never Smoker  . Smokeless tobacco: Never Used  . Tobacco comment: Non-smoker. Smoking cessation materials contraindicated  Substance and Sexual Activity  . Alcohol use: No    Alcohol/week: 0.0 standard drinks  . Drug use: No  . Sexual activity: Never  Lifestyle  . Physical activity:    Days per week: 0 days    Minutes per session: 0 min  . Stress: Rather much  Relationships  . Social connections:    Talks on phone: Once a week    Gets together: Never    Attends religious service: Never    Active member of club or organization: No    Attends meetings of clubs or organizations: Never    Relationship status: Married  . Intimate partner violence:    Fear of current or ex partner: No    Emotionally abused: No    Physically abused: No    Forced sexual activity: No  Other Topics Concern  . Not on file  Social History Narrative  . Not on file     Current Outpatient Medications:  .  aspirin 81 MG tablet, Take by mouth., Disp: , Rfl:  .  atorvastatin (LIPITOR) 40 MG tablet, Take 1 tablet (40 mg total) by mouth daily., Disp: 90 tablet, Rfl: 1 .  cholecalciferol (VITAMIN D) 1000 UNITS tablet, Take by mouth., Disp: , Rfl:  .  ketoconazole (NIZORAL) 2 % cream, APPLY TO THE SKIN TWICE A DAY TO INFRAMAMMARY AND GROIN AS DIRECTED, Disp: , Rfl: 0 .  letrozole (FEMARA) 2.5 MG tablet, Take 1 tablet (2.5 mg total) by mouth daily., Disp: 30 tablet, Rfl: 12 .  lisinopril (PRINIVIL,ZESTRIL) 20 MG tablet, Take 1 tablet (20 mg total) by mouth daily., Disp: 90 tablet, Rfl: 1 .  metFORMIN (GLUCOPHAGE) 850 MG tablet, Take 1 tablet (850 mg total) by mouth 2 (two) times daily with a meal., Disp: 180 tablet, Rfl: 1 .  naproxen sodium (ALEVE) 220 MG tablet, Take by mouth., Disp: , Rfl:  .  sertraline (ZOLOFT) 100 MG tablet, Take 1 tablet (100 mg total) by mouth daily., Disp: 90 tablet, Rfl: 0 .  SYNTHROID 112 MCG tablet, Take 1 tablet (112 mcg total) by  mouth daily., Disp: 90 tablet, Rfl: 1 .  traZODone (DESYREL) 50 MG tablet, Take 0.5-1 tablets (25-50 mg total) by mouth at bedtime as needed for sleep., Disp: 90 tablet, Rfl: 1 .  vitamin B-12 (CYANOCOBALAMIN) 1000 MCG tablet, Take 1,000 mcg by mouth every morning., Disp: , Rfl:   No Known Allergies  I personally reviewed active problem list, medication list, allergies, family history, social history with the patient/caregiver today.   ROS  Constitutional: Negative for  fever or weight change.  Respiratory: Negative for cough and shortness of breath.   Cardiovascular: Negative for chest pain or palpitations.  Gastrointestinal: Negative for abdominal pain, no bowel changes.  Musculoskeletal: positive  for gait problem or joint swelling.  Skin: positive  For intermittent groin  rash.  Neurological: Negative for dizziness or headache.  No other specific complaints in a complete review of systems (except as listed in HPI above).  Objective  Vitals:   06/28/18 1115  BP: (!) 146/86  Pulse: 83  Resp: 16  Temp: 97.7 F (36.5 C)  TempSrc: Oral  SpO2: 99%  Weight: 185 lb 8 oz (84.1 kg)  Height: 5\' 1"  (1.549 m)    Body mass index is 35.05 kg/m.  Physical Exam  Constitutional: Patient appears well-developed and well-nourished. Obese  No distress.  HEENT: head atraumatic, normocephalic, pupils equal and reactive to light,neck supple, throat within normal limits Cardiovascular: Normal rate, regular rhythm and normal heart sounds.  No murmur heard. No BLE edema. Pulmonary/Chest: Effort normal and breath sounds normal. No respiratory distress. Abdominal: Soft.  There is no tenderness. Psychiatric: Patient has a normal mood and affect. behavior is normal. Judgment and thought content normal.  PHQ2/9: Depression screen [facility] 2/9 06/28/2018 03/28/2018 11/24/2017 08/25/2017 07/29/2017  Decreased Interest 2 0 0 0 1  Down, Depressed, Hopeless 2 0 0 0 1  PHQ - 2 Score 4 0 0 0 2  Altered sleeping 2  2 - - 1  Tired, decreased energy 3 2 - - 1  Change in appetite 2 1 - - 1  Feeling bad or failure about yourself  1 1 - - 1  Trouble concentrating 1 1 - - 1  Moving slowly or fidgety/restless 0 0 - - 1  Suicidal thoughts 0 0 - - 1  PHQ-9 Score 13 7 - - 9  Difficult doing work/chores Somewhat difficult - - - Somewhat difficult    Fall Risk: Fall Risk  06/28/2018 06/27/2018 03/28/2018 11/24/2017 08/25/2017  Falls in the past year? 0 0 No No No  Number falls in past yr: 0 - - - -  Injury with Fall? 0 - - - -  Risk for fall due to : - - - - -  Risk for fall due to: Comment - - - - -     Functional Status Survey: Is the patient deaf or have difficulty hearing?: Yes Does the patient have difficulty seeing, even when wearing glasses/contacts?: Yes Does the patient have difficulty concentrating, remembering, or making decisions?: No Does the patient have difficulty walking or climbing stairs?: No Does the patient have difficulty dressing or bathing?: No Does the patient have difficulty doing errands alone such as visiting a doctor's office or shopping?: Yes    Assessment & Plan  1. Adult hypothyroidism  Continue current dose of levothyroxine and recheck level next visit  2. Moderate episode of recurrent major depressive disorder (HCC)  We will increase dose of Zoloft, very depressed, feels isolated, does not drive. Advised to contact pastor to see if they can take her to church  - sertraline (ZOLOFT) 100 MG tablet; Take 1 tablet (100 mg total) by mouth daily.  Dispense: 90 tablet; Refill: 0  3. Controlled type 2 diabetes mellitus with microalbuminuria, without long-term current use of insulin (HCC)  - lisinopril (PRINIVIL,ZESTRIL) 20 MG tablet; Take 1 tablet (20 mg total) by mouth daily.  Dispense: 90 tablet; Refill: 1 - metFORMIN (GLUCOPHAGE) 850 MG tablet; Take 1 tablet (850 mg  total) by mouth 2 (two) times daily with a meal.  Dispense: 180 tablet; Refill: 1  4. B12  deficiency  Taking B12 still has some paresthesia tips of fingers  5. Benign hypertension  Slightly elevated today, usually at goal, continue current dose and adjust dose next visit if it remains elevated - lisinopril (PRINIVIL,ZESTRIL) 20 MG tablet; Take 1 tablet (20 mg total) by mouth daily.  Dispense: 90 tablet; Refill: 1  6. Dyslipidemia associated with type 2 diabetes mellitus (HCC)  - atorvastatin (LIPITOR) 40 MG tablet; Take 1 tablet (40 mg total) by mouth daily.  Dispense: 90 tablet; Refill: 1 - metFORMIN (GLUCOPHAGE) 850 MG tablet; Take 1 tablet (850 mg total) by mouth 2 (two) times daily with a meal.  Dispense: 180 tablet; Refill: 1  7. Osteoporosis, post-menopausal   8. Malignant neoplasm of upper-outer quadrant of left female breast, unspecified estrogen receptor status ([place])  Under the care of Dr. Fleet Contras, on Femara  9. Need for immunization against influenza  - Flu vaccine HIGH DOSE PF  10. Lack of access to transportation  - Referral to Chronic Care Management Services  11. Morbid obesity ([place])  BMI above 35 with multiple co morbidities   12. Primary osteoarthritis of both knees  Seen by Ortho, taking Naproxen occasionally , having PT at home , started yesterday

## 2018-06-30 ENCOUNTER — Ambulatory Visit: Payer: Self-pay

## 2018-06-30 DIAGNOSIS — R809 Proteinuria, unspecified: Principal | ICD-10-CM

## 2018-06-30 DIAGNOSIS — E1129 Type 2 diabetes mellitus with other diabetic kidney complication: Secondary | ICD-10-CM

## 2018-06-30 DIAGNOSIS — E7800 Pure hypercholesterolemia, unspecified: Secondary | ICD-10-CM

## 2018-06-30 NOTE — Chronic Care Management (AMB) (Signed)
  Chronic Care Management Note    77 y.o. year old female referred to Chronic Care Management by [name] for Chronic Case Management services specifically transportation needs. Chronic conditions include HTN, Hyperlipidemia, and DM2. Last office visit with [place] was 06/28/18.   Was unable to reach patient via telephone today for introduction of services to the CCM program and have left HIPAA compliant message with husband asking patient to return my call. (unsuccessful outreach #1).  Plan: Will follow-up within 3-5  business days via telephone.     Keziah Avis E. Rollene Rotunda, RN, BSN Nurse Care Coordinator [facility] Care Management  [phone]

## 2018-07-04 ENCOUNTER — Telehealth: Payer: Self-pay

## 2018-07-04 ENCOUNTER — Ambulatory Visit: Payer: Self-pay

## 2018-07-04 ENCOUNTER — Encounter: Payer: Self-pay | Admitting: *Deleted

## 2018-07-04 DIAGNOSIS — R809 Proteinuria, unspecified: Principal | ICD-10-CM

## 2018-07-04 DIAGNOSIS — E1129 Type 2 diabetes mellitus with other diabetic kidney complication: Secondary | ICD-10-CM

## 2018-07-04 DIAGNOSIS — I10 Essential (primary) hypertension: Secondary | ICD-10-CM

## 2018-07-04 NOTE — Chronic Care Management (AMB) (Addendum)
  Chronic Care Management Note    77 y.o. year old female referred to Chronic Care Management by Dr. [name] for  for Chronic Case Management services specifically transportation needs. Chronic conditions include HTN, Hyperlipidemia, and DM2. Last office visit with [place] was 06/28/18.   Was unable to reach patient via telephone today for introduction of services provided by the CCM Team. I was unable to leave a HIPAA compliant VM message as "mailbox is full" (unsuccessful outreach #2).  Plan: Will follow-up within 3-5  business days via telephone.     Sherri Levenhagen E. Rollene Rotunda, RN, BSN Nurse Care Coordinator [facility] Care Management  [phone]

## 2018-07-06 ENCOUNTER — Telehealth: Payer: Self-pay

## 2018-07-11 ENCOUNTER — Ambulatory Visit: Payer: Self-pay | Admitting: Pharmacist

## 2018-07-11 ENCOUNTER — Ambulatory Visit: Payer: Self-pay

## 2018-07-11 DIAGNOSIS — R809 Proteinuria, unspecified: Principal | ICD-10-CM

## 2018-07-11 DIAGNOSIS — E039 Hypothyroidism, unspecified: Secondary | ICD-10-CM

## 2018-07-11 DIAGNOSIS — M810 Age-related osteoporosis without current pathological fracture: Secondary | ICD-10-CM

## 2018-07-11 DIAGNOSIS — E1129 Type 2 diabetes mellitus with other diabetic kidney complication: Secondary | ICD-10-CM

## 2018-07-11 DIAGNOSIS — I10 Essential (primary) hypertension: Secondary | ICD-10-CM

## 2018-07-11 DIAGNOSIS — E7800 Pure hypercholesterolemia, unspecified: Secondary | ICD-10-CM

## 2018-07-11 DIAGNOSIS — Z5982 Transportation insecurity: Secondary | ICD-10-CM

## 2018-07-11 DIAGNOSIS — Z9189 Other specified personal risk factors, not elsewhere classified: Secondary | ICD-10-CM

## 2018-07-11 NOTE — Progress Notes (Signed)
Erroneous encounter

## 2018-07-11 NOTE — Chronic Care Management (AMB) (Signed)
  Chronic Care Management   Note  07/11/2018 Name: [name] MRN: [id number] DOB: 10-03-1940   Lindsey [name] is a 77 y.o. year old female who sees Dr. Ancil Boozer for primary care. Dr. Ancil Boozer asked the CCM team to consult the patient for assistance with chronic disease management related to community resource needs and diabetes education. Referral was placed 06/28/18. Telephone outreach to patient today to introduce CCM services.    Plan: Patient agreed to services and verbal consent obtained. [name] did not have her calendar but will call and schedule an initial appointment when her calendar is available. I will follow up in 1 week if I have not heard from her.   [name] was given information about Chronic Care Management services today including:  1. CCM service includes personalized support from designated clinical staff supervised by her physician, including individualized plan of care and coordination with other care providers 2. 24/7 contact phone numbers for assistance for urgent and routine care needs. 3. Service will only be billed when office clinical staff spend 20 minutes or more in a month to coordinate care. 4. Only one practitioner [name] furnish and bill the service in a calendar month. 5. The patient [name] stop CCM services at any time (effective at the end of the month) by phone call to the office staff. 6. The patient will be responsible for cost sharing (co-pay) of up to 20% of the service fee (after annual deductible is met).   [name], PharmD Clinical Pharmacist [facility] [phone]

## 2018-07-11 NOTE — Patient Instructions (Signed)
Lindsey [name] was given information about Chronic Care Management services today including:  1. CCM service includes personalized support from designated clinical staff supervised by her physician, including individualized plan of care and coordination with other care providers 2. 24/7 contact phone numbers for assistance for urgent and routine care needs. 3. Service will only be billed when office clinical staff spend 20 minutes or more in a month to coordinate care. 4. Only one practitioner [place] furnish and bill the service in a calendar month. 5. The patient [place] stop CCM services at any time (effective at the end of the month) by phone call to the office staff. 6. The patient will be responsible for cost sharing (co-pay) of up to 20% of the service fee (after annual deductible is met).  Patient agreed to services and verbal consent obtained.   Please call a member of the CCM (Chronic Care Management) Team with any questions or case management needs:   Vanetta Mulders, BSN Nurse Care Coordinator  [phone]  [name], PharmD  Clinical Pharmacist  [phone]

## 2018-07-13 DIAGNOSIS — E113393 Type 2 diabetes mellitus with moderate nonproliferative diabetic retinopathy without macular edema, bilateral: Secondary | ICD-10-CM | POA: Diagnosis not present

## 2018-07-18 ENCOUNTER — Ambulatory Visit (INDEPENDENT_AMBULATORY_CARE_PROVIDER_SITE_OTHER): Payer: PPO | Admitting: Pharmacist

## 2018-07-18 DIAGNOSIS — I10 Essential (primary) hypertension: Secondary | ICD-10-CM

## 2018-07-18 DIAGNOSIS — Z9189 Other specified personal risk factors, not elsewhere classified: Secondary | ICD-10-CM

## 2018-07-18 DIAGNOSIS — R809 Proteinuria, unspecified: Secondary | ICD-10-CM

## 2018-07-18 DIAGNOSIS — M810 Age-related osteoporosis without current pathological fracture: Secondary | ICD-10-CM | POA: Diagnosis not present

## 2018-07-18 DIAGNOSIS — Z5982 Transportation insecurity: Secondary | ICD-10-CM

## 2018-07-18 DIAGNOSIS — E7800 Pure hypercholesterolemia, unspecified: Secondary | ICD-10-CM | POA: Diagnosis not present

## 2018-07-18 DIAGNOSIS — E039 Hypothyroidism, unspecified: Secondary | ICD-10-CM

## 2018-07-18 DIAGNOSIS — E1129 Type 2 diabetes mellitus with other diabetic kidney complication: Secondary | ICD-10-CM | POA: Diagnosis not present

## 2018-07-18 NOTE — Chronic Care Management (AMB) (Signed)
  Chronic Care Management   Note  07/18/2018 Name: Lindsey [name] MRN: [id number] DOB: 07/07/1941   Lindsey [name] is a 77 y.o. year old female who sees Dr. Ancil Boozer for primary care. Dr. Ancil Boozer asked the CCM team to consult the patient for assistance with chronic disease management related to community resource needs and diabetes education. Referral was placed 06/28/18, consent received 07/11/18.   Follow up call placed today to schedule an initial CCM visit with patient. Patient states that she depends on family members for rides and will have to contact the CCM team once that is arranged. I provided the CCM team contact information.   Follow up in 1 month if patient does not reach out.   [name], PharmD Clinical Pharmacist [facility] [phone]

## 2018-07-24 DIAGNOSIS — M722 Plantar fascial fibromatosis: Secondary | ICD-10-CM | POA: Diagnosis not present

## 2018-08-01 ENCOUNTER — Ambulatory Visit: Payer: PPO

## 2018-08-07 DIAGNOSIS — R159 Full incontinence of feces: Secondary | ICD-10-CM | POA: Diagnosis not present

## 2018-08-07 DIAGNOSIS — R197 Diarrhea, unspecified: Secondary | ICD-10-CM | POA: Diagnosis not present

## 2018-08-17 ENCOUNTER — Ambulatory Visit: Payer: Self-pay | Admitting: Pharmacist

## 2018-08-17 NOTE — Chronic Care Management (AMB) (Signed)
  Chronic Care Management   Follow Up Note   08/17/2018 Name: Lindsey [name] MRN: [id number] DOB: [birth year]  Referred by: [place] Reason for referral : Chronic Care Management (Follow up outreach)   [name] a 77 y.o.year old femalewho sees Dr. Ladon Applebaum primary care.Dr. Lenox Ahr the CCM team to consult the patient for assistance with chronic disease management related to community resource needs and diabetes education. Referral was placed11/6/19, consent received 07/11/18.  Patient stated on 07/18/18 that she depends on family members for rides and will have to contact the CCM team once that is arranged.    Follow up call placed today as a one month follow up with patient regarding scheduling an initial CCM visit with patient. Unfortunately, patient was not available. HIPAA compliant voicemail was left with CCM team contact information.   Plan: Patient has CCM team contact information. CCM team will discontinue outreach calls. CCM team can plan to engage patient in person at visit with Dr. Ancil Boozer in February 2020.    [name], PharmD Clinical Pharmacist [facility] [phone]

## 2018-09-06 DIAGNOSIS — R197 Diarrhea, unspecified: Secondary | ICD-10-CM | POA: Diagnosis not present

## 2018-09-12 DIAGNOSIS — K640 First degree hemorrhoids: Secondary | ICD-10-CM | POA: Diagnosis not present

## 2018-09-12 DIAGNOSIS — R159 Full incontinence of feces: Secondary | ICD-10-CM | POA: Diagnosis not present

## 2018-09-12 DIAGNOSIS — R197 Diarrhea, unspecified: Secondary | ICD-10-CM | POA: Diagnosis not present

## 2018-09-12 LAB — HM COLONOSCOPY

## 2018-09-15 DIAGNOSIS — R197 Diarrhea, unspecified: Secondary | ICD-10-CM | POA: Diagnosis not present

## 2018-10-02 ENCOUNTER — Ambulatory Visit: Payer: PPO | Admitting: Family Medicine

## 2018-10-14 DIAGNOSIS — M25571 Pain in right ankle and joints of right foot: Secondary | ICD-10-CM | POA: Diagnosis not present

## 2018-10-16 DIAGNOSIS — M810 Age-related osteoporosis without current pathological fracture: Secondary | ICD-10-CM | POA: Diagnosis not present

## 2018-10-16 DIAGNOSIS — K219 Gastro-esophageal reflux disease without esophagitis: Secondary | ICD-10-CM | POA: Diagnosis not present

## 2018-10-24 DIAGNOSIS — M810 Age-related osteoporosis without current pathological fracture: Secondary | ICD-10-CM | POA: Diagnosis not present

## 2018-11-27 ENCOUNTER — Ambulatory Visit (INDEPENDENT_AMBULATORY_CARE_PROVIDER_SITE_OTHER): Payer: PPO | Admitting: Family Medicine

## 2018-11-27 ENCOUNTER — Other Ambulatory Visit: Payer: Self-pay

## 2018-11-27 ENCOUNTER — Encounter: Payer: Self-pay | Admitting: Family Medicine

## 2018-11-27 VITALS — BP 158/94 | HR 73 | Temp 97.5°F | Wt 180.0 lb

## 2018-11-27 DIAGNOSIS — Z79899 Other long term (current) drug therapy: Secondary | ICD-10-CM | POA: Diagnosis not present

## 2018-11-27 DIAGNOSIS — E039 Hypothyroidism, unspecified: Secondary | ICD-10-CM

## 2018-11-27 DIAGNOSIS — E1129 Type 2 diabetes mellitus with other diabetic kidney complication: Secondary | ICD-10-CM

## 2018-11-27 DIAGNOSIS — I480 Paroxysmal atrial fibrillation: Secondary | ICD-10-CM | POA: Diagnosis not present

## 2018-11-27 DIAGNOSIS — E538 Deficiency of other specified B group vitamins: Secondary | ICD-10-CM | POA: Diagnosis not present

## 2018-11-27 DIAGNOSIS — R040 Epistaxis: Secondary | ICD-10-CM | POA: Diagnosis not present

## 2018-11-27 DIAGNOSIS — F331 Major depressive disorder, recurrent, moderate: Secondary | ICD-10-CM | POA: Diagnosis not present

## 2018-11-27 DIAGNOSIS — E1169 Type 2 diabetes mellitus with other specified complication: Secondary | ICD-10-CM

## 2018-11-27 DIAGNOSIS — R809 Proteinuria, unspecified: Secondary | ICD-10-CM | POA: Diagnosis not present

## 2018-11-27 DIAGNOSIS — E1165 Type 2 diabetes mellitus with hyperglycemia: Secondary | ICD-10-CM | POA: Diagnosis not present

## 2018-11-27 DIAGNOSIS — I2510 Atherosclerotic heart disease of native coronary artery without angina pectoris: Secondary | ICD-10-CM | POA: Diagnosis not present

## 2018-11-27 DIAGNOSIS — E785 Hyperlipidemia, unspecified: Secondary | ICD-10-CM | POA: Diagnosis not present

## 2018-11-27 DIAGNOSIS — E7800 Pure hypercholesterolemia, unspecified: Secondary | ICD-10-CM | POA: Diagnosis not present

## 2018-11-27 DIAGNOSIS — I10 Essential (primary) hypertension: Secondary | ICD-10-CM

## 2018-11-27 DIAGNOSIS — M8589 Other specified disorders of bone density and structure, multiple sites: Secondary | ICD-10-CM | POA: Diagnosis not present

## 2018-11-27 DIAGNOSIS — I700 Atherosclerosis of aorta: Secondary | ICD-10-CM | POA: Diagnosis not present

## 2018-11-27 DIAGNOSIS — Z780 Asymptomatic menopausal state: Secondary | ICD-10-CM | POA: Diagnosis not present

## 2018-11-27 DIAGNOSIS — S81011D Laceration without foreign body, right knee, subsequent encounter: Secondary | ICD-10-CM | POA: Diagnosis not present

## 2018-11-27 DIAGNOSIS — Z1159 Encounter for screening for other viral diseases: Secondary | ICD-10-CM | POA: Diagnosis not present

## 2018-11-27 DIAGNOSIS — Z125 Encounter for screening for malignant neoplasm of prostate: Secondary | ICD-10-CM | POA: Diagnosis not present

## 2018-11-27 DIAGNOSIS — M170 Bilateral primary osteoarthritis of knee: Secondary | ICD-10-CM | POA: Diagnosis not present

## 2018-11-27 MED ORDER — LISINOPRIL-HYDROCHLOROTHIAZIDE 20-12.5 MG PO TABS: 1.0000 | ORAL_TABLET | Freq: Every day | ORAL | 1 refills | Status: DC

## 2018-11-27 MED ORDER — SERTRALINE HCL 100 MG PO TABS: 100.0000 mg | ORAL_TABLET | Freq: Every day | ORAL | 1 refills | Status: DC

## 2018-11-27 MED ORDER — METFORMIN HCL 850 MG PO TABS: 850.0000 mg | ORAL_TABLET | Freq: Two times a day (BID) | ORAL | 1 refills | Status: DC

## 2018-11-27 MED ORDER — ATORVASTATIN CALCIUM 40 MG PO TABS: 40.0000 mg | ORAL_TABLET | Freq: Every day | ORAL | 1 refills | Status: DC

## 2018-11-27 NOTE — Progress Notes (Signed)
Name: Lindsey [name]   MRN: [id number]    DOB: [birth year]   Date:11/27/2018       Progress Note  Subjective  Chief Complaint  No chief complaint on file.   I connected with@ on 11/27/18 at 11:00 AM EDT by telephone and verified that I am speaking with the correct person using two identifiers.  I discussed the limitations, risks, security and privacy concerns of performing an evaluation and management service by telephone and the availability of in person appointments. Staff also discussed with the patient that there Srinivasan be a patient responsible charge related to this service. Patient Location: home  Provider Location: [facility]   HPI  Syncope: she states she was at home working in her yard a couple of weeks ago and felt weak and asked her son to help her, they took her inside the house and sat her down, she fainted when she was sitting down on a chair, not sure how long she was out, no bowel or bladder incontinence. Her daughter-in-law is a Marine scientist but did not tell her how long she was out. She states once she woke up she did not feel confused and was back to normal, no episodes since. Discussed to never work outside alone, drink plenty of fluids and have snacks every 2 hours.   Nose bleeds: only on the left side, small amount but it happened multiple times for two days last week, no episodes since. No using any nasal spray, she stopped taking Zyrtec and symptoms resolved.   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 Also low HDL, on statin therapy. Denies polyphagia, polyuria or polydipsia. She does not check her glucose at home. She is taking aspirin ( discussed new guidelines -but she wants to continue medication). HgbA1C is at goal, last level 7.1% , we  changed from Januvia 50 mg and Metformin 500 mg bid to Metformin 850 mg twice daily to off set cost, she will come in this week for labs.  Breast cancer: had  on the right side in 1983, treated with mastectomy ,  and had recurrence on the left in 2017 and had mastectomy of the left , she has been on Femara since, under the care of Dr. Fleet Contras Unchanged  HTN: taking ace and denies cough or angioedema. No chest pain or palpitation. BP was high at home last night, explained she needs to check it more often We will adjust medication from lisinopril 20 to lisinopril hctz 20/12.5   Hypothyroidism: she has been on Synthroid 112 mcg for many years. She has occasional dysphagia after taking medication, last TSH at goal.  She is due for labs and will return this week for labs only   Osteoporosis: last bone density showed osteoporosis, she was on fosamax but never got refills , she was seen by Dr. Gabriel Carina and is waiting for PA for Prolia   Morbid obesity: based on co-morbidities, BMI 35 , discussed importance of daily physical activity. She has been active in her yard   Major Depression: States stressed out because husband is sick, difficulty with transportation since she never learned how to drive. She has a daughter but does not come over frequently, no longer can go to church. She was taking zoloft 100 mg but pharmacy dispensed 50 mg, we will go back to 100 mg. She is staying busy around the house. However worried about COVID-19  Bowel incontinence: resolved  since colonoscopy  Patient Active Problem List   Diagnosis Date Noted  . History of breast cancer 06/28/2018  . Bilateral lower extremity edema 01/16/2016  . Breast cancer, left breast ([place]) 11/05/2015  . 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 Morbid (severe) obesity due to excess calories ([place]) 08/26/2015  . Controlled type 2 diabetes mellitus with microalbuminuria ([place]) 04/25/2015  . Hyperlipidemia 04/25/2015  . Benign hypertension 04/25/2015  . Adult hypothyroidism 04/25/2015  . At risk for falling 04/25/2015  . Major depression ([place]) 04/25/2015  . Post menopausal syndrome 04/25/2015     Past Surgical History:  Procedure Laterality Date  . BREAST BIOPSY Left 11-04-15  . BREAST BIOPSY Left 10-10-07  . CATARACT EXTRACTION Bilateral 1990  . CHOLECYSTECTOMY    . MASTECTOMY Right 1983   Dr Myrle Sheng  . MASTECTOMY Right 1983  . MASTECTOMY W/ SENTINEL NODE BIOPSY Left 12/12/2015   Procedure: MASTECTOMY WITH SENTINEL LYMPH NODE BIOPSY;  Surgeon: Christene Lye, MD;  Location: [facility] ORS;  Service: General;  Laterality: Left;    Family History  Problem Relation Age of Onset  . Kidney disease Mother   . Heart disease Father   . Stroke Sister   . Cancer Sister        breast  . Breast cancer Sister   . Stroke Brother   . Diabetes Brother   . Hypertension Brother   . Cancer Daughter 44       DCIS/lumpectomy/genetic negative  . Breast cancer Daughter     Social History   Socioeconomic History  . Marital status: Married    Spouse name: Not on file  . Number of children: 3  . Years of education: Not on file  . Highest education level: Not on file  Occupational History  . Occupation: Retired  Scientific laboratory technician  . Financial resource strain: Not hard at all  . Food insecurity:    Worry: Never true    Inability: Never true  . Transportation needs:    Medical: No    Non-medical: No  Tobacco Use  . Smoking status: Never Smoker  . Smokeless tobacco: Never Used  . Tobacco comment: Non-smoker. Smoking cessation materials contraindicated  Substance and Sexual Activity  . Alcohol use: No    Alcohol/week: 0.0 standard drinks  . Drug use: No  . Sexual activity: Never  Lifestyle  . Physical activity:    Days per week: 0 days    Minutes per session: 0 min  . Stress: Rather much  Relationships  . Social connections:    Talks on phone: Once a week    Gets together: Never    Attends religious service: Never    Active member of club or organization: No    Attends meetings of clubs or organizations: Never    Relationship status: Married  . Intimate partner  violence:    Fear of current or ex partner: No    Emotionally abused: No    Physically abused: No    Forced sexual activity: No  Other Topics Concern  . Not on file  Social History Narrative  . Not on file     Current Outpatient Medications:  .  alendronate (FOSAMAX) 70 MG tablet, Take 1 tablet (70 mg total) by mouth every 7 (seven) days. Take with a full glass of water on an empty stomach., Disp: 12 tablet, Rfl: 3 .  aspirin 81 MG tablet, Take by mouth., Disp: , Rfl:  .  atorvastatin (LIPITOR) 40 MG tablet, Take 1  tablet (40 mg total) by mouth daily., Disp: 90 tablet, Rfl: 1 .  cholecalciferol (VITAMIN D) 1000 UNITS tablet, Take by mouth., Disp: , Rfl:  .  ketoconazole (NIZORAL) 2 % cream, APPLY TO THE SKIN TWICE A DAY TO INFRAMAMMARY AND GROIN AS DIRECTED, Disp: , Rfl: 0 .  letrozole (FEMARA) 2.5 MG tablet, Take 1 tablet (2.5 mg total) by mouth daily., Disp: 30 tablet, Rfl: 12 .  lisinopril (PRINIVIL,ZESTRIL) 20 MG tablet, Take 1 tablet (20 mg total) by mouth daily., Disp: 90 tablet, Rfl: 1 .  metFORMIN (GLUCOPHAGE) 850 MG tablet, Take 1 tablet (850 mg total) by mouth 2 (two) times daily with a meal., Disp: 180 tablet, Rfl: 1 .  naproxen sodium (ALEVE) 220 MG tablet, Take by mouth., Disp: , Rfl:  .  sertraline (ZOLOFT) 100 MG tablet, Take 1 tablet (100 mg total) by mouth daily., Disp: 90 tablet, Rfl: 0 .  SYNTHROID 112 MCG tablet, Take 1 tablet (112 mcg total) by mouth daily., Disp: 90 tablet, Rfl: 1 .  traZODone (DESYREL) 50 MG tablet, Take 0.5-1 tablets (25-50 mg total) by mouth at bedtime as needed for sleep., Disp: 90 tablet, Rfl: 1 .  vitamin B-12 (CYANOCOBALAMIN) 1000 MCG tablet, Take 1,000 mcg by mouth every morning., Disp: , Rfl:   No Known Allergies  I personally reviewed active problem list, medication list, allergies, family history, social history with the patient/caregiver today.   ROS  Constitutional: Negative for fever or weight change.  Respiratory: Negative for  cough and shortness of breath.   Cardiovascular: Negative for chest pain or palpitations.  Gastrointestinal: Negative for abdominal pain, no bowel changes.  Musculoskeletal: Negative for gait problem or joint swelling.  Skin: Negative for rash.  Neurological: Negative for dizziness, positive for intermittent  headache.  No other specific complaints in a complete review of systems (except as listed in HPI above).   Objective  Virtual encounter, vitals not obtained.  There is no height or weight on file to calculate BMI.  Physical Exam  Awake, alert and oriented, no distress   PHQ2/9: Depression screen [facility] 2/9 06/28/2018 03/28/2018 11/24/2017 08/25/2017 07/29/2017  Decreased Interest 2 0 0 0 1  Down, Depressed, Hopeless 2 0 0 0 1  PHQ - 2 Score 4 0 0 0 2  Altered sleeping 2 2 - - 1  Tired, decreased energy 3 2 - - 1  Change in appetite 2 1 - - 1  Feeling bad or failure about yourself  1 1 - - 1  Trouble concentrating 1 1 - - 1  Moving slowly or fidgety/restless 0 0 - - 1  Suicidal thoughts 0 0 - - 1  PHQ-9 Score 13 7 - - 9  Difficult doing work/chores Somewhat difficult - - - Somewhat difficult   PHQ-2/9 Result is positive.    Fall Risk: Fall Risk  06/28/2018 06/27/2018 03/28/2018 11/24/2017 08/25/2017  Falls in the past year? 0 0 No No No  Number falls in past yr: 0 - - - -  Injury with Fall? 0 - - - -  Risk for fall due to : - - - - -  Risk for fall due to: Comment - - - - -    Assessment & Plan  1. Dyslipidemia associated with type 2 diabetes mellitus (HCC)  - atorvastatin (LIPITOR) 40 MG tablet; Take 1 tablet (40 mg total) by mouth daily.  Dispense: 90 tablet; Refill: 1 - metFORMIN (GLUCOPHAGE) 850 MG tablet; Take 1 tablet (850 mg  total) by mouth 2 (two) times daily with a meal.  Dispense: 180 tablet; Refill: 1 - Lipid panel  2. Benign hypertension  - COMPLETE METABOLIC PANEL WITH GFR - CBC with Differential/Platelet - lisinopril-hydrochlorothiazide (PRINZIDE,ZESTORETIC)  20-12.5 MG tablet; Take 1 tablet by mouth daily.  Dispense: 90 tablet; Refill: 1  3. Controlled type 2 diabetes mellitus with microalbuminuria, without long-term current use of insulin (HCC)  - metFORMIN (GLUCOPHAGE) 850 MG tablet; Take 1 tablet (850 mg total) by mouth 2 (two) times daily with a meal.  Dispense: 180 tablet; Refill: 1 - Hemoglobin A1c  4. Moderate episode of recurrent major depressive disorder (HCC)  - sertraline (ZOLOFT) 100 MG tablet; Take 1 tablet (100 mg total) by mouth daily.  Dispense: 90 tablet; Refill: 1  5. Adult hypothyroidism  - TSH  6. Long-term use of high-risk medication  - B12  7. Bleeding nose  - CBC with Differential/Platelet  8. Low serum vitamin B12  - B12 I discussed the assessment and treatment plan with the patient. The patient was provided an opportunity to ask questions and all were answered. The patient agreed with the plan and demonstrated an understanding of the instructions.   The patient was advised to call back or seek an in-person evaluation if the symptoms worsen or if the condition fails to improve as anticipated.  I provided 25  minutes of non-face-to-face time during this encounter.  Loistine Chance, MD

## 2018-11-30 LAB — COMPLETE METABOLIC PANEL WITH GFR
AG Ratio: 1.9 (calc) (ref 1.0–2.5)
ALT: 15 U/L (ref 6–29)
AST: 16 U/L (ref 10–35)
Albumin: 4.4 g/dL (ref 3.6–5.1)
Alkaline phosphatase (APISO): 56 U/L (ref 37–153)
BUN: 18 mg/dL (ref 7–25)
CO2: 31 mmol/L (ref 20–32)
Calcium: 9.7 mg/dL (ref 8.6–10.4)
Chloride: 103 mmol/L (ref 98–110)
Creat: 0.86 mg/dL (ref 0.60–0.93)
GFR, Est African American: 75 mL/min/{1.73_m2} (ref >=60)
GFR, Est Non African American: 65 mL/min/{1.73_m2} (ref >=60)
Globulin: 2.3 g/dL (calc) (ref 1.9–3.7)
Glucose, Bld: 180 mg/dL — ABNORMAL HIGH (ref 65–99)
Potassium: 4.7 mmol/L (ref 3.5–5.3)
Sodium: 142 mmol/L (ref 135–146)
Total Bilirubin: 0.8 mg/dL (ref 0.2–1.2)
Total Protein: 6.7 g/dL (ref 6.1–8.1)

## 2018-11-30 LAB — CBC WITH DIFFERENTIAL/PLATELET
Absolute Monocytes: 360 cells/uL (ref 200–950)
Basophils Absolute: 52 cells/uL (ref 0–200)
Basophils Relative: 0.9 %
Eosinophils Absolute: 203 cells/uL (ref 15–500)
Eosinophils Relative: 3.5 %
HCT: 38.9 % (ref 35.0–45.0)
Hemoglobin: 13 g/dL (ref 11.7–15.5)
Lymphs Abs: 1508 cells/uL (ref 850–3900)
MCH: 28.5 pg (ref 27.0–33.0)
MCHC: 33.4 g/dL (ref 32.0–36.0)
MCV: 85.3 fL (ref 80.0–100.0)
MPV: 12.5 fL (ref 7.5–12.5)
Monocytes Relative: 6.2 %
Neutro Abs: 3677 cells/uL (ref 1500–7800)
Neutrophils Relative %: 63.4 %
Platelets: 189 10*3/uL (ref 140–400)
RBC: 4.56 10*6/uL (ref 3.80–5.10)
RDW: 13.4 % (ref 11.0–15.0)
Total Lymphocyte: 26 %
WBC: 5.8 10*3/uL (ref 3.8–10.8)

## 2018-11-30 LAB — HEMOGLOBIN A1C
Hgb A1c MFr Bld: 7.8 % of total Hgb — ABNORMAL HIGH (ref <5.7)
Mean Plasma Glucose: 177 (calc)
eAG (mmol/L): 9.8 (calc)

## 2018-11-30 LAB — LIPID PANEL
Cholesterol: 125 mg/dL (ref <200)
HDL: 42 mg/dL — ABNORMAL LOW (ref >=50)
LDL Cholesterol (Calc): 64 mg/dL (calc)
Non-HDL Cholesterol (Calc): 83 mg/dL (calc) (ref <130)
Total CHOL/HDL Ratio: 3 (calc) (ref <5.0)
Triglycerides: 106 mg/dL (ref <150)

## 2018-11-30 LAB — TSH: TSH: 0.83 mIU/L (ref 0.40–4.50)

## 2018-11-30 LAB — VITAMIN B12: Vitamin B-12: 860 pg/mL (ref 200–1100)

## 2018-12-03 ENCOUNTER — Other Ambulatory Visit: Payer: Self-pay | Admitting: Family Medicine

## 2018-12-07 ENCOUNTER — Other Ambulatory Visit: Payer: Self-pay

## 2018-12-07 ENCOUNTER — Emergency Department: Payer: PPO

## 2018-12-07 ENCOUNTER — Emergency Department
Admission: EM | Admit: 2018-12-07 | Discharge: 2018-12-07 | Disposition: A | Discharge status: Home / Self Care | Payer: PPO | Attending: Emergency Medicine | Admitting: Emergency Medicine

## 2018-12-07 DIAGNOSIS — Y9289 Other specified places as the place of occurrence of the external cause: Secondary | ICD-10-CM | POA: Diagnosis not present

## 2018-12-07 DIAGNOSIS — E119 Type 2 diabetes mellitus without complications: Secondary | ICD-10-CM | POA: Insufficient documentation

## 2018-12-07 DIAGNOSIS — I10 Essential (primary) hypertension: Secondary | ICD-10-CM | POA: Diagnosis not present

## 2018-12-07 DIAGNOSIS — S52002A Unspecified fracture of upper end of left ulna, initial encounter for closed fracture: Secondary | ICD-10-CM | POA: Insufficient documentation

## 2018-12-07 DIAGNOSIS — S52001S Unspecified fracture of upper end of right ulna, sequela: Secondary | ICD-10-CM | POA: Diagnosis not present

## 2018-12-07 DIAGNOSIS — Z79899 Other long term (current) drug therapy: Secondary | ICD-10-CM | POA: Diagnosis not present

## 2018-12-07 DIAGNOSIS — W010XXA Fall on same level from slipping, tripping and stumbling without subsequent striking against object, initial encounter: Secondary | ICD-10-CM | POA: Diagnosis not present

## 2018-12-07 DIAGNOSIS — E785 Hyperlipidemia, unspecified: Secondary | ICD-10-CM | POA: Diagnosis not present

## 2018-12-07 DIAGNOSIS — Y9389 Activity, other specified: Secondary | ICD-10-CM | POA: Diagnosis not present

## 2018-12-07 DIAGNOSIS — Z7982 Long term (current) use of aspirin: Secondary | ICD-10-CM | POA: Insufficient documentation

## 2018-12-07 DIAGNOSIS — Y998 Other external cause status: Secondary | ICD-10-CM | POA: Insufficient documentation

## 2018-12-07 DIAGNOSIS — S52102A Unspecified fracture of upper end of left radius, initial encounter for closed fracture: Secondary | ICD-10-CM | POA: Diagnosis not present

## 2018-12-07 DIAGNOSIS — S52101S Unspecified fracture of upper end of right radius, sequela: Secondary | ICD-10-CM | POA: Diagnosis not present

## 2018-12-07 DIAGNOSIS — S59902A Unspecified injury of left elbow, initial encounter: Secondary | ICD-10-CM | POA: Diagnosis present

## 2018-12-07 MED ORDER — OXYCODONE HCL 5 MG PO TABS
5.0000 mg | ORAL_TABLET | Freq: Once | ORAL | Status: AC
  Administered 2018-12-07: 23:00:00 5 mg via ORAL
  Filled 2018-12-07: qty 1

## 2018-12-07 MED ORDER — OXYCODONE HCL 5 MG PO TABS: 5.0000 mg | ORAL_TABLET | Freq: Three times a day (TID) | ORAL | 0 refills | Status: DC | PRN

## 2018-12-07 NOTE — ED Notes (Signed)
Patient c/o left elbow pain post fall earlier today. Patient denies LOC or other injury. Patient reports she fell onto left knee and struck elbow. Patient able to extend/flex knee with no issue.

## 2018-12-07 NOTE — Discharge Instructions (Signed)
Call Dr. Nicholaus Bloom office in the morning to schedule an appointment.   Be careful if you take the pain medication. It [place] make you dizzy.  Return to the ER for symptoms of concern if unable to schedule an appointment.

## 2018-12-07 NOTE — ED Notes (Signed)
This RN reviewed discharge instructions, follow-up care, prescriptions, cryotherapy, and need for elevation with patient. Patient verbalized understanding of all reviewed information.  Patient stable, with no distress noted at this time.

## 2018-12-07 NOTE — ED Provider Notes (Signed)
[facility] Emergency Department Provider Note ____________________________________________  Time seen: Approximately 10:00 PM  I have reviewed the triage vital signs and the nursing notes.   HISTORY  Chief Complaint Joint Swelling    HPI Lindsey [name] is a 78 y.o. female who presents to the emergency department for evaluation and treatment of left elbow pain after a mechanical, non-syncopal fall tonight. She believes her left knee gave out while she was walking and caused her to fall. She denies striking her head, dizziness, or loss of consciousness. She has pain in the left elbow with swelling and is unable to extend her arm. No alleviating measures prior to arrival. Significant PMH of diabetes and osteoporosis.  Past Medical History:  Diagnosis Date  . Arthritis   . Breast cancer ([place]) 1983   right breast cancer  . 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 Bursitis    R knee  . Depression   . Diabetes mellitus type 2, controlled, without complications ([place]) 03/30/4165  . Diabetes mellitus without complication ([place])   . Hyperlipidemia   . Hypertension   . Hypothyroidism   . Osteoporosis   . 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 Thyroid disease     Patient Active Problem List   Diagnosis Date Noted  . History of breast cancer 06/28/2018  . Bilateral lower extremity edema 01/16/2016  . Breast cancer, left breast ([place]) 11/05/2015  . 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 Morbid (severe) obesity due to excess calories ([place]) 08/26/2015  . Controlled type 2 diabetes mellitus with microalbuminuria ([place]) 04/25/2015  . Hyperlipidemia 04/25/2015  . Benign hypertension 04/25/2015  . Adult hypothyroidism 04/25/2015  . At risk for falling 04/25/2015  . Major depression ([place]) 04/25/2015  . Post menopausal syndrome 04/25/2015    Past Surgical History:  Procedure  Laterality Date  . BREAST BIOPSY Left 11-04-15  . BREAST BIOPSY Left 10-10-07  . CATARACT EXTRACTION Bilateral 1990  . CHOLECYSTECTOMY    . MASTECTOMY Right 1983   Dr Myrle Sheng  . MASTECTOMY Right 1983  . MASTECTOMY W/ SENTINEL NODE BIOPSY Left 12/12/2015   Procedure: MASTECTOMY WITH SENTINEL LYMPH NODE BIOPSY;  Surgeon: Christene Lye, MD;  Location: [facility] ORS;  Service: General;  Laterality: Left;    Prior to Admission medications   Medication Sig Start Date End Date Taking? Authorizing Provider  alendronate (FOSAMAX) 70 MG tablet Take 1 tablet (70 mg total) by mouth every 7 (seven) days. Take with a full glass of water on an empty stomach. 06/28/18   [name], MD  aspirin 81 MG tablet Take by mouth.    [provider]  atorvastatin (LIPITOR) 40 MG tablet Take 1 tablet (40 mg total) by mouth daily. 11/27/18   [name], MD  cholecalciferol (VITAMIN D) 1000 UNITS tablet Take by mouth.    [provider]  ketoconazole (NIZORAL) 2 % cream APPLY TO THE SKIN TWICE A DAY TO INFRAMAMMARY AND GROIN AS DIRECTED 06/13/15   [provider]  letrozole (FEMARA) 2.5 MG tablet Take 1 tablet (2.5 mg total) by mouth daily. 12/21/17   Robert Bellow, MD  lisinopril-hydrochlorothiazide (PRINZIDE,ZESTORETIC) 20-12.5 MG tablet Take 1 tablet by mouth daily. 11/27/18   [name], MD  metFORMIN (GLUCOPHAGE) 850 MG tablet Take 1 tablet (850 mg total) by mouth 2 (two) times daily with a meal. 11/27/18   Ancil Boozer, Drue Stager, MD  naproxen sodium (ALEVE)  220 MG tablet Take by mouth.    [provider]  oxyCODONE (ROXICODONE) 5 MG immediate release tablet Take 1 tablet (5 mg total) by mouth every 8 (eight) hours as needed. 12/07/18 12/07/19  Haru Shaff, Johnette Abraham B, FNP  sertraline (ZOLOFT) 100 MG tablet Take 1 tablet (100 mg total) by mouth daily. 11/27/18   [name], MD  SYNTHROID 112 MCG tablet Take 1 tablet (112 mcg total) by mouth daily. 05/23/18   [name], MD   traZODone (DESYREL) 50 MG tablet Take 0.5-1 tablets (25-50 mg total) by mouth at bedtime as needed for sleep. 11/24/17   [name], MD  vitamin B-12 (CYANOCOBALAMIN) 1000 MCG tablet Take 1,000 mcg by mouth every morning.    [provider]    Allergies Patient has no known allergies.  Family History  Problem Relation Age of Onset  . Kidney disease Mother   . Heart disease Father   . Stroke Sister   . Cancer Sister        breast  . Breast cancer Sister   . Stroke Brother   . Diabetes Brother   . Hypertension Brother   . Cancer Daughter 24       DCIS/lumpectomy/genetic negative  . Breast cancer Daughter     Social History Social History   Tobacco Use  . Smoking status: Never Smoker  . Smokeless tobacco: Never Used  . Tobacco comment: Non-smoker. Smoking cessation materials contraindicated  Substance Use Topics  . Alcohol use: No    Alcohol/week: 0.0 standard drinks  . Drug use: No    Review of Systems Constitutional: Negative for fever. Cardiovascular: Negative for chest pain. Respiratory: Negative for shortness of breath. Musculoskeletal: Positive for left elbow pain. Skin: Negative for open wound or lesion.  Neurological: Negative for decrease in sensation  ____________________________________________   PHYSICAL EXAM:  VITAL SIGNS: ED Triage Vitals  Enc Vitals Group     BP 12/07/18 2147 (!) 220/104     Pulse Rate 12/07/18 2147 98     Resp 12/07/18 2147 20     Temp 12/07/18 2147 98.4 F (36.9 C)     Temp Source 12/07/18 2147 Oral     SpO2 12/07/18 2147 98 %     Weight 12/07/18 2146 180 lb (81.6 kg)     Height 12/07/18 2146 5\' 2"  (1.575 m)     Head Circumference --      Peak Flow --      Pain Score 12/07/18 2146 6     Pain Loc --      Pain Edu? --      Excl. in [place]? --     Constitutional: Alert and oriented. Well appearing and in no acute distress. Eyes: Conjunctivae are clear without discharge or drainage Head: Atraumatic Neck:  Supple. No midline tenderness. Respiratory: No cough. Respirations are even and unlabored. Musculoskeletal: Focal tenderness over the left elbow. Very limited ROM due to pain. No pain on exam of shoulder, wrist, or hand. Neurologic: Awake, alert, oriented.  Skin: No open wounds or lesions over the left elbow.  Psychiatric: Affect and behavior are appropriate.  ____________________________________________   LABS (all labs ordered are listed, but only abnormal results are displayed)  Labs Reviewed - No data to display ____________________________________________  RADIOLOGY  Image of the left elbow shows proximal radial and ulnar fractures.  Ulnar fracture is angulated mildly displaced.  Radial head fracture is impacted and angulated.  Joint effusion is seen. Image was viewed by me as  well as radiology. ____________________________________________   PROCEDURES  Procedures  ____________________________________________   INITIAL IMPRESSION / ASSESSMENT AND PLAN / ED COURSE  Lindsey [name] is a 78 y.o. who presents to the emergency department for treatment and evaluation of left elbow pain after mechanical, non-syncopal fall prior to arrival.  X-ray shows proximal displaced and angulated radius and ulnar fractures.  Case was reviewed with Dr. Roland Rack who recommends [facility] and follow-up outpatient with plan for surgical intervention next week.  X-ray results were discussed with the patient as well as instructions to call in the morning to schedule an outpatient visit with Dr. Roland Rack.  Patient verbalized understanding.  Instructions were also provided in writing.  Medications  oxyCODONE (Oxy IR/ROXICODONE) immediate release tablet 5 mg (5 mg Oral Given 12/07/18 2308)    Pertinent labs & imaging results that were available during my care of the patient were reviewed by me and considered in my medical decision making (see chart for  details).  _________________________________________   FINAL CLINICAL IMPRESSION(S) / ED DIAGNOSES  Final diagnoses:  Closed fracture of proximal end of left radius and ulna, initial encounter    ED Discharge Orders         Ordered    oxyCODONE (ROXICODONE) 5 MG immediate release tablet  Every 8 hours PRN     12/07/18 2331           If controlled substance prescribed during this visit, 12 month history viewed on the [place] prior to issuing an initial prescription for Schedule II or III opiod.   Victorino Dike, FNP 12/07/18 2354    Schuyler Amor, MD 12/08/18 [phone]

## 2018-12-07 NOTE — ED Triage Notes (Signed)
Pt in with co left elbow pain states she lost her balance and fell. Denies any head injury or neck pain. Pt was able to walk after fall.

## 2018-12-08 ENCOUNTER — Other Ambulatory Visit: Payer: Self-pay

## 2018-12-08 ENCOUNTER — Other Ambulatory Visit: Payer: Self-pay | Admitting: Orthopedic Surgery

## 2018-12-08 ENCOUNTER — Encounter (HOSPITAL_BASED_OUTPATIENT_CLINIC_OR_DEPARTMENT_OTHER): Payer: Self-pay | Admitting: *Deleted

## 2018-12-08 DIAGNOSIS — S52092A Other fracture of upper end of left ulna, initial encounter for closed fracture: Secondary | ICD-10-CM | POA: Diagnosis not present

## 2018-12-08 DIAGNOSIS — S52132A Displaced fracture of neck of left radius, initial encounter for closed fracture: Secondary | ICD-10-CM | POA: Diagnosis not present

## 2018-12-11 ENCOUNTER — Encounter (HOSPITAL_BASED_OUTPATIENT_CLINIC_OR_DEPARTMENT_OTHER)
Admission: RE | Disposition: A | Discharge status: Home / Self Care | Payer: Self-pay | Source: Home / Self Care | Attending: Orthopedic Surgery

## 2018-12-11 ENCOUNTER — Ambulatory Visit (HOSPITAL_BASED_OUTPATIENT_CLINIC_OR_DEPARTMENT_OTHER)
Admission: RE | Admit: 2018-12-11 | Discharge: 2018-12-11 | Disposition: A | Discharge status: Home / Self Care | Payer: PPO | Attending: Orthopedic Surgery | Admitting: Orthopedic Surgery

## 2018-12-11 ENCOUNTER — Ambulatory Visit (HOSPITAL_COMMUNITY): Payer: PPO

## 2018-12-11 ENCOUNTER — Ambulatory Visit (HOSPITAL_BASED_OUTPATIENT_CLINIC_OR_DEPARTMENT_OTHER): Payer: PPO | Admitting: Anesthesiology

## 2018-12-11 ENCOUNTER — Encounter (HOSPITAL_BASED_OUTPATIENT_CLINIC_OR_DEPARTMENT_OTHER): Payer: Self-pay | Admitting: Certified Registered"

## 2018-12-11 ENCOUNTER — Other Ambulatory Visit: Payer: Self-pay

## 2018-12-11 DIAGNOSIS — M1990 Unspecified osteoarthritis, unspecified site: Secondary | ICD-10-CM | POA: Diagnosis not present

## 2018-12-11 DIAGNOSIS — E039 Hypothyroidism, unspecified: Secondary | ICD-10-CM | POA: Insufficient documentation

## 2018-12-11 DIAGNOSIS — S52132A Displaced fracture of neck of left radius, initial encounter for closed fracture: Secondary | ICD-10-CM | POA: Insufficient documentation

## 2018-12-11 DIAGNOSIS — Z853 Personal history of malignant neoplasm of breast: Secondary | ICD-10-CM | POA: Insufficient documentation

## 2018-12-11 DIAGNOSIS — Z9012 Acquired absence of left breast and nipple: Secondary | ICD-10-CM | POA: Diagnosis not present

## 2018-12-11 DIAGNOSIS — Z7984 Long term (current) use of oral hypoglycemic drugs: Secondary | ICD-10-CM | POA: Insufficient documentation

## 2018-12-11 DIAGNOSIS — G8918 Other acute postprocedural pain: Secondary | ICD-10-CM | POA: Diagnosis not present

## 2018-12-11 DIAGNOSIS — S52002A Unspecified fracture of upper end of left ulna, initial encounter for closed fracture: Secondary | ICD-10-CM | POA: Diagnosis not present

## 2018-12-11 DIAGNOSIS — F419 Anxiety disorder, unspecified: Secondary | ICD-10-CM | POA: Insufficient documentation

## 2018-12-11 DIAGNOSIS — M810 Age-related osteoporosis without current pathological fracture: Secondary | ICD-10-CM | POA: Insufficient documentation

## 2018-12-11 DIAGNOSIS — Z419 Encounter for procedure for purposes other than remedying health state, unspecified: Secondary | ICD-10-CM

## 2018-12-11 DIAGNOSIS — S52092A Other fracture of upper end of left ulna, initial encounter for closed fracture: Secondary | ICD-10-CM | POA: Diagnosis not present

## 2018-12-11 DIAGNOSIS — Z7983 Long term (current) use of bisphosphonates: Secondary | ICD-10-CM | POA: Diagnosis not present

## 2018-12-11 DIAGNOSIS — F329 Major depressive disorder, single episode, unspecified: Secondary | ICD-10-CM | POA: Insufficient documentation

## 2018-12-11 DIAGNOSIS — E119 Type 2 diabetes mellitus without complications: Secondary | ICD-10-CM | POA: Diagnosis not present

## 2018-12-11 DIAGNOSIS — W19XXXA Unspecified fall, initial encounter: Secondary | ICD-10-CM | POA: Insufficient documentation

## 2018-12-11 DIAGNOSIS — Z79890 Hormone replacement therapy (postmenopausal): Secondary | ICD-10-CM | POA: Insufficient documentation

## 2018-12-11 DIAGNOSIS — I10 Essential (primary) hypertension: Secondary | ICD-10-CM | POA: Insufficient documentation

## 2018-12-11 DIAGNOSIS — Z7982 Long term (current) use of aspirin: Secondary | ICD-10-CM | POA: Diagnosis not present

## 2018-12-11 DIAGNOSIS — S52002D Unspecified fracture of upper end of left ulna, subsequent encounter for closed fracture with routine healing: Secondary | ICD-10-CM | POA: Diagnosis not present

## 2018-12-11 DIAGNOSIS — Z79899 Other long term (current) drug therapy: Secondary | ICD-10-CM | POA: Insufficient documentation

## 2018-12-11 DIAGNOSIS — E785 Hyperlipidemia, unspecified: Secondary | ICD-10-CM | POA: Insufficient documentation

## 2018-12-11 HISTORY — PX: RADIAL HEAD ARTHROPLASTY: SHX6044

## 2018-12-11 HISTORY — DX: Gastro-esophageal reflux disease without esophagitis: K21.9

## 2018-12-11 HISTORY — DX: Other complications of anesthesia, initial encounter: T88.59XA

## 2018-12-11 HISTORY — DX: Anxiety disorder, unspecified: F41.9

## 2018-12-11 HISTORY — PX: ORIF ULNAR FRACTURE: SHX5417

## 2018-12-11 HISTORY — DX: Unspecified fracture of shaft of left ulna, initial encounter for closed fracture: S52.202A

## 2018-12-11 HISTORY — DX: Unspecified fracture of shaft of left ulna, initial encounter for closed fracture: S52.92XA

## 2018-12-11 HISTORY — DX: Adverse effect of unspecified anesthetic, initial encounter: T41.45XA

## 2018-12-11 HISTORY — DX: Nausea with vomiting, unspecified: R11.2

## 2018-12-11 HISTORY — DX: Other specified postprocedural states: Z98.890

## 2018-12-11 LAB — GLUCOSE, CAPILLARY
Glucose-Capillary: 212 mg/dL — ABNORMAL HIGH (ref 70–99)
Glucose-Capillary: 230 mg/dL — ABNORMAL HIGH (ref 70–99)

## 2018-12-11 SURGERY — OPEN REDUCTION INTERNAL FIXATION (ORIF) ULNAR FRACTURE
Anesthesia: General | Site: Elbow | Laterality: Left

## 2018-12-11 MED ORDER — ACETAMINOPHEN 325 MG PO TABS: 650.0000 mg | ORAL_TABLET | Freq: Four times a day (QID) | ORAL | Status: DC

## 2018-12-11 MED ORDER — CEFAZOLIN SODIUM-DEXTROSE 2-4 GM/100ML-% IV SOLN
INTRAVENOUS | Status: AC
  Filled 2018-12-11: qty 100

## 2018-12-11 MED ORDER — ONDANSETRON HCL 4 MG/2ML IJ SOLN
INTRAMUSCULAR | Status: DC | PRN
  Administered 2018-12-11: 4 mg via INTRAVENOUS

## 2018-12-11 MED ORDER — CLONIDINE HCL (ANALGESIA) 100 MCG/ML EP SOLN
EPIDURAL | Status: DC | PRN
  Administered 2018-12-11: 70 ug

## 2018-12-11 MED ORDER — PHENYLEPHRINE 40 MCG/ML (10ML) SYRINGE FOR IV PUSH (FOR BLOOD PRESSURE SUPPORT)
PREFILLED_SYRINGE | INTRAVENOUS | Status: AC
  Filled 2018-12-11: qty 10

## 2018-12-11 MED ORDER — FENTANYL CITRATE (PF) 100 MCG/2ML IJ SOLN
50.0000 ug | INTRAMUSCULAR | Status: DC | PRN
  Administered 2018-12-11: 75 ug via INTRAVENOUS

## 2018-12-11 MED ORDER — SODIUM CHLORIDE 0.9 % IV SOLN
INTRAVENOUS | Status: DC | PRN
  Administered 2018-12-11: 12:00:00 10 ug/min via INTRAVENOUS

## 2018-12-11 MED ORDER — FENTANYL CITRATE (PF) 100 MCG/2ML IJ SOLN: 25.0000 ug | INTRAMUSCULAR | Status: DC | PRN

## 2018-12-11 MED ORDER — LIDOCAINE 2% (20 MG/ML) 5 ML SYRINGE
INTRAMUSCULAR | Status: DC | PRN
  Administered 2018-12-11: 60 mg via INTRAVENOUS

## 2018-12-11 MED ORDER — LIDOCAINE-EPINEPHRINE (PF) 1.5 %-1:200000 IJ SOLN
INTRAMUSCULAR | Status: DC | PRN
  Administered 2018-12-11: 10 mL via PERINEURAL

## 2018-12-11 MED ORDER — PHENYLEPHRINE 40 MCG/ML (10ML) SYRINGE FOR IV PUSH (FOR BLOOD PRESSURE SUPPORT)
PREFILLED_SYRINGE | INTRAVENOUS | Status: DC | PRN
  Administered 2018-12-11 (×5): 80 ug via INTRAVENOUS

## 2018-12-11 MED ORDER — MIDAZOLAM HCL 2 MG/2ML IJ SOLN
INTRAMUSCULAR | Status: AC
  Filled 2018-12-11: qty 2

## 2018-12-11 MED ORDER — OXYCODONE HCL 5 MG/5ML PO SOLN: 5.0000 mg | Freq: Once | ORAL | Status: DC | PRN

## 2018-12-11 MED ORDER — FENTANYL CITRATE (PF) 100 MCG/2ML IJ SOLN
INTRAMUSCULAR | Status: AC
  Filled 2018-12-11: qty 2

## 2018-12-11 MED ORDER — EPHEDRINE SULFATE-NACL 50-0.9 MG/10ML-% IV SOSY
PREFILLED_SYRINGE | INTRAVENOUS | Status: DC | PRN
  Administered 2018-12-11: 10 mg via INTRAVENOUS

## 2018-12-11 MED ORDER — OXYCODONE HCL 5 MG PO TABS: 5.0000 mg | ORAL_TABLET | Freq: Four times a day (QID) | ORAL | 0 refills | Status: DC | PRN

## 2018-12-11 MED ORDER — POVIDONE-IODINE 10 % EX SWAB: 2.0000 "application " | Freq: Once | CUTANEOUS | Status: DC

## 2018-12-11 MED ORDER — MIDAZOLAM HCL 2 MG/2ML IJ SOLN: 1.0000 mg | INTRAMUSCULAR | Status: DC | PRN

## 2018-12-11 MED ORDER — BUPIVACAINE HCL (PF) 0.5 % IJ SOLN
INTRAMUSCULAR | Status: DC | PRN
  Administered 2018-12-11: 20 mL via PERINEURAL

## 2018-12-11 MED ORDER — IBUPROFEN 200 MG PO TABS: 600.0000 mg | ORAL_TABLET | Freq: Four times a day (QID) | ORAL | 0 refills | Status: DC

## 2018-12-11 MED ORDER — PROPOFOL 10 MG/ML IV BOLUS
INTRAVENOUS | Status: DC | PRN
  Administered 2018-12-11: 150 mg via INTRAVENOUS

## 2018-12-11 MED ORDER — LACTATED RINGERS IV SOLN
INTRAVENOUS | Status: DC
  Administered 2018-12-11: 13:00:00 via INTRAVENOUS
  Administered 2018-12-11: 11:00:00 10 mL/h via INTRAVENOUS
  Administered 2018-12-11: 14:00:00 via INTRAVENOUS

## 2018-12-11 MED ORDER — EPHEDRINE 5 MG/ML INJ
INTRAVENOUS | Status: AC
  Filled 2018-12-11: qty 10

## 2018-12-11 MED ORDER — KETOROLAC TROMETHAMINE 30 MG/ML IJ SOLN: 15.0000 mg | Freq: Once | INTRAMUSCULAR | Status: DC | PRN

## 2018-12-11 MED ORDER — SCOPOLAMINE 1 MG/3DAYS TD PT72: 1.0000 | MEDICATED_PATCH | Freq: Once | TRANSDERMAL | Status: DC | PRN

## 2018-12-11 MED ORDER — LIDOCAINE 2% (20 MG/ML) 5 ML SYRINGE
INTRAMUSCULAR | Status: AC
  Filled 2018-12-11: qty 5

## 2018-12-11 MED ORDER — ONDANSETRON HCL 4 MG/2ML IJ SOLN
INTRAMUSCULAR | Status: AC
  Filled 2018-12-11: qty 2

## 2018-12-11 MED ORDER — PROMETHAZINE HCL 25 MG/ML IJ SOLN: 6.2500 mg | INTRAMUSCULAR | Status: DC | PRN

## 2018-12-11 MED ORDER — CHLORHEXIDINE GLUCONATE 4 % EX LIQD: 60.0000 mL | Freq: Once | CUTANEOUS | Status: DC

## 2018-12-11 MED ORDER — CEFAZOLIN SODIUM-DEXTROSE 2-4 GM/100ML-% IV SOLN
2.0000 g | INTRAVENOUS | Status: AC
  Administered 2018-12-11: 2 g via INTRAVENOUS

## 2018-12-11 MED ORDER — OXYCODONE HCL 5 MG PO TABS: 5.0000 mg | ORAL_TABLET | Freq: Once | ORAL | Status: DC | PRN

## 2018-12-11 MED ORDER — PHENYLEPHRINE HCL (PRESSORS) 10 MG/ML IV SOLN
INTRAVENOUS | Status: AC
  Filled 2018-12-11: qty 1

## 2018-12-11 MED ORDER — PROPOFOL 10 MG/ML IV BOLUS
INTRAVENOUS | Status: AC
  Filled 2018-12-11: qty 20

## 2018-12-11 SURGICAL SUPPLY — 80 items
BENZOIN TINCTURE PRP APPL 2/3 (GAUZE/BANDAGES/DRESSINGS) ×4 IMPLANT
BIT DRILL 2.5X2.75 QC CALB (BIT) ×4 IMPLANT
BIT DRILL CALIBRATED 2.7 (BIT) ×3 IMPLANT
BIT DRILL CALIBRATED 2.7MM (BIT) ×1
BLADE MINI RND TIP GREEN BEAV (BLADE) IMPLANT
BLADE PRESCISION 7.0X.51X18.5 (BLADE) ×4 IMPLANT
BLADE SURG 15 STRL LF DISP TIS (BLADE) ×2 IMPLANT
BLADE SURG 15 STRL SS (BLADE) ×2
BNDG COHESIVE 4X5 TAN STRL (GAUZE/BANDAGES/DRESSINGS) ×4 IMPLANT
BNDG ESMARK 4X9 LF (GAUZE/BANDAGES/DRESSINGS) ×4 IMPLANT
BNDG GAUZE ELAST 4 BULKY (GAUZE/BANDAGES/DRESSINGS) ×4 IMPLANT
BRUSH SCRUB EZ PLAIN DRY (MISCELLANEOUS) ×4 IMPLANT
CANISTER SUCT 1200ML W/VALVE (MISCELLANEOUS) ×4 IMPLANT
CHLORAPREP W/TINT 26 (MISCELLANEOUS) ×4 IMPLANT
CLOSURE WOUND 1/2 X4 (GAUZE/BANDAGES/DRESSINGS) ×1
CORD BIPOLAR FORCEPS 12FT (ELECTRODE) ×4 IMPLANT
COVER BACK TABLE REUSABLE LG (DRAPES) ×4 IMPLANT
COVER MAYO STAND REUSABLE (DRAPES) ×4 IMPLANT
COVER WAND RF STERILE (DRAPES) IMPLANT
CUFF TOURN SGL QUICK 18X4 (TOURNIQUET CUFF) ×4 IMPLANT
CUFF TOURN SGL QUICK 24 (TOURNIQUET CUFF)
CUFF TRNQT CYL 24X4X16.5-23 (TOURNIQUET CUFF) IMPLANT
DRAPE C-ARM 42X72 X-RAY (DRAPES) ×4 IMPLANT
DRAPE C-ARMOR (DRAPES) IMPLANT
DRAPE EXTREMITY T 121X128X90 (DISPOSABLE) ×4 IMPLANT
DRAPE SURG 17X23 STRL (DRAPES) IMPLANT
DRAPE U-SHAPE 47X51 STRL (DRAPES) ×4 IMPLANT
DRSG ADAPTIC 3X8 NADH LF (GAUZE/BANDAGES/DRESSINGS) ×4 IMPLANT
DRSG EMULSION OIL 3X3 NADH (GAUZE/BANDAGES/DRESSINGS) IMPLANT
GAUZE SPONGE 4X4 12PLY STRL LF (GAUZE/BANDAGES/DRESSINGS) ×4 IMPLANT
GLOVE BIO SURGEON STRL SZ 6.5 (GLOVE) ×9 IMPLANT
GLOVE BIO SURGEON STRL SZ7.5 (GLOVE) ×4 IMPLANT
GLOVE BIO SURGEONS STRL SZ 6.5 (GLOVE) ×3
GLOVE BIOGEL PI IND STRL 7.0 (GLOVE) ×8 IMPLANT
GLOVE BIOGEL PI IND STRL 8 (GLOVE) ×2 IMPLANT
GLOVE BIOGEL PI INDICATOR 7.0 (GLOVE) ×8
GLOVE BIOGEL PI INDICATOR 8 (GLOVE) ×2
GLOVE ECLIPSE 6.5 STRL STRAW (GLOVE) ×4 IMPLANT
GOWN STRL REUS W/ TWL LRG LVL3 (GOWN DISPOSABLE) ×8 IMPLANT
GOWN STRL REUS W/TWL LRG LVL3 (GOWN DISPOSABLE) ×8
GOWN STRL REUS W/TWL XL LVL3 (GOWN DISPOSABLE) ×4 IMPLANT
IMPL HEAD (Orthopedic Implant) ×2 IMPLANT
IMPL STEM WITH SCREW 8X29 (Stem) ×2 IMPLANT
IMPLANT HEAD (Orthopedic Implant) ×4 IMPLANT
IMPLANT STEM WITH SCREW 8X29 (Stem) ×4 IMPLANT
K-WIRE FIXATION 2.0X6 (WIRE) ×4
KWIRE FIXATION 2.0X6 (WIRE) ×2 IMPLANT
NEEDLE HYPO 25X1 1.5 SAFETY (NEEDLE) ×4 IMPLANT
NS IRRIG 1000ML POUR BTL (IV SOLUTION) ×4 IMPLANT
PACK BASIN DAY SURGERY FS (CUSTOM PROCEDURE TRAY) ×4 IMPLANT
PADDING CAST ABS 4INX4YD NS (CAST SUPPLIES)
PADDING CAST ABS COTTON 4X4 ST (CAST SUPPLIES) IMPLANT
PENCIL BUTTON HOLSTER BLD 10FT (ELECTRODE) ×4 IMPLANT
PLATE LONG OLECRANON LEFT (Plate) ×4 IMPLANT
SCREW LOCK CORT STAR 3.5X12 (Screw) ×8 IMPLANT
SCREW LOCK CORT STAR 3.5X14 (Screw) ×12 IMPLANT
SCREW LOCK CORT STAR 3.5X16 (Screw) ×8 IMPLANT
SCREW LOCK CORT STAR 3.5X18 (Screw) ×4 IMPLANT
SCREW LOCK CORT STAR 3.5X20 (Screw) ×4 IMPLANT
SCREW LOCK CORT STAR 3.5X26 (Screw) ×4 IMPLANT
SCREW LOW PROFILE 3.5X48MM (Screw) ×4 IMPLANT
SCREW NON LOCKING LP 3.5 14MM (Screw) ×8 IMPLANT
SLEEVE SCD COMPRESS KNEE MED (MISCELLANEOUS) ×4 IMPLANT
STAPLER VISISTAT 35W (STAPLE) ×4 IMPLANT
STOCKINETTE 6  STRL (DRAPES) ×2
STOCKINETTE 6 STRL (DRAPES) ×2 IMPLANT
STRIP CLOSURE SKIN 1/2X4 (GAUZE/BANDAGES/DRESSINGS) ×3 IMPLANT
SUCTION FRAZIER HANDLE 10FR (MISCELLANEOUS) ×2
SUCTION TUBE FRAZIER 10FR DISP (MISCELLANEOUS) ×2 IMPLANT
SUT VIC AB 2-0 CT3 27 (SUTURE) ×4 IMPLANT
SUT VICRYL 4-0 PS2 18IN ABS (SUTURE) IMPLANT
SUT VICRYL RAPIDE 4/0 PS 2 (SUTURE) ×4 IMPLANT
SYR 10ML LL (SYRINGE) ×4 IMPLANT
SYR BULB 3OZ (MISCELLANEOUS) ×4 IMPLANT
TOWEL GREEN STERILE FF (TOWEL DISPOSABLE) ×4 IMPLANT
TUBE CONNECTING 20'X1/4 (TUBING) ×1
TUBE CONNECTING 20X1/4 (TUBING) ×3 IMPLANT
UNDERPAD 30X30 (UNDERPADS AND DIAPERS) ×4 IMPLANT
WASHER 3.5MM (Orthopedic Implant) ×12 IMPLANT
YANKAUER SUCT BULB TIP 10FT TU (MISCELLANEOUS) ×4 IMPLANT

## 2018-12-11 NOTE — H&P (Signed)
Lindsey [name] is an 78 y.o. female.   Chief Complaint: L UE injury  HPI: This patient is a 78 year old RHD female who sustained a mechanical fall last Thursday, resulting in angulated fractures of the left proximal radius and ulna.  She was treated elsewhere in the emergency department, placed into a splint, and presents today for definitive management.  I have had a previous virtual visit with her in preparation for surgery.  She reports that she is done reasonably well over the weekend and is eager to proceed.  Past Medical History:  Diagnosis Date  . Anxiety   . Arthritis   . Breast cancer ([place]) 1983   right breast cancer  . 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 Bursitis    R knee  . Closed fracture of left ulna and radius   . Complication of anesthesia   . Depression   . Diabetes mellitus type 2, controlled, without complications ([place]) 03/23/174  . Diabetes mellitus without complication ([place])   . GERD (gastroesophageal reflux disease)    TUMS  . Hyperlipidemia   . Hypertension   . Hypothyroidism   . Osteoporosis   . 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 PONV (postoperative nausea and vomiting)   . Thyroid disease     Past Surgical History:  Procedure Laterality Date  . BREAST BIOPSY Left 11-04-15  . BREAST BIOPSY Left 10-10-07  . CATARACT EXTRACTION Bilateral 1990  . CHOLECYSTECTOMY    . MASTECTOMY Right 1983   Dr Myrle Sheng  . MASTECTOMY Right 1983  . MASTECTOMY W/ SENTINEL NODE BIOPSY Left 12/12/2015   Procedure: MASTECTOMY WITH SENTINEL LYMPH NODE BIOPSY;  Surgeon: Christene Lye, MD;  Location: [facility] ORS;  Service: General;  Laterality: Left;    Family History  Problem Relation Age of Onset  . Kidney disease Mother   . Heart disease Father   . Stroke Sister   . Cancer Sister        breast  . Breast cancer Sister   . Stroke Brother   . Diabetes Brother   . Hypertension  Brother   . Cancer Daughter 102       DCIS/lumpectomy/genetic negative  . Breast cancer Daughter    Social History:  reports that she has never smoked. She has never used smokeless tobacco. She reports that she does not drink alcohol or use drugs.  Allergies: No Known Allergies  No medications prior to admission.    No results found for this or any previous visit (from the past 48 hour(s)). No results found.  Review of Systems  All other systems reviewed and are negative.   Height 5\' 2"  (1.575 m), weight 81.6 kg. Physical Exam  Constitutional: She is oriented to person, place, and time. She appears well-developed and well-nourished.  HENT:  Head: Normocephalic and atraumatic.  Eyes: Pupils are equal, round, and reactive to light.  Neck: Normal range of motion.  Cardiovascular: Intact distal pulses.  Respiratory: Effort normal.  GI: Soft.  Musculoskeletal:     Comments: Left upper extremity in long-arm splint.  Hand and digits exposed.  Intact light touch sensibility in the radial, median, and ulnar nerve distributions, with intact motor to the same, intact AIN and PIN function.  Digits warm, CR brisk  Neurological: She is alert and oriented to person, place, and time.  Skin: Skin is warm and dry.  Psychiatric: She has a normal  mood and affect. Her behavior is normal. Judgment and thought content normal.     Assessment/Plan Angulated left proximal radius neck fracture and extra-articular ulnar fracture, apex posteriorly.  Discussion was held with her regarding again the indications for surgery with goals, risk, and options reviewed and consent was obtained.  She will receive a preoperative regional block and undergo ORIF of the ulna as an outpatient.  Intraoperatively we will assess whether to treat the radius nonoperatively, via ORIF, or perhaps prosthetic replacement.  Jolyn Nap, MD 12/11/2018, 7:45 AM

## 2018-12-11 NOTE — Anesthesia Preprocedure Evaluation (Signed)
Anesthesia Evaluation  Patient identified by MRN, date of birth, ID band Patient awake    Reviewed: Allergy & Precautions, NPO status , Patient's Chart, lab work & pertinent test results  History of Anesthesia Complications (+) PONV  Airway Mallampati: II  TM Distance: >3 FB Neck ROM: Full    Dental no notable dental hx.    Pulmonary neg pulmonary ROS,    Pulmonary exam normal breath sounds clear to auscultation       Cardiovascular hypertension, Normal cardiovascular exam Rhythm:Regular Rate:Normal     Neuro/Psych negative neurological ROS  negative psych ROS   GI/Hepatic Neg liver ROS, GERD  ,  Endo/Other  diabetesHypothyroidism   Renal/GU negative Renal ROS  negative genitourinary   Musculoskeletal negative musculoskeletal ROS (+)   Abdominal   Peds negative pediatric ROS (+)  Hematology negative hematology ROS (+)   Anesthesia Other Findings   Reproductive/Obstetrics negative OB ROS                             Anesthesia Physical Anesthesia Plan  ASA: II  Anesthesia Plan: General   Post-op Pain Management:  Regional for Post-op pain   Induction: Intravenous  PONV Risk Score and Plan: 4 or greater and Ondansetron, Dexamethasone and Treatment Golaszewski vary due to age or medical condition  Airway Management Planned: LMA  Additional Equipment:   Intra-op Plan:   Post-operative Plan: Extubation in OR  Informed Consent: I have reviewed the patients History and Physical, chart, labs and discussed the procedure including the risks, benefits and alternatives for the proposed anesthesia with the patient or authorized representative who has indicated his/her understanding and acceptance.     Dental advisory given  Plan Discussed with: CRNA and Surgeon  Anesthesia Plan Comments:         Anesthesia Quick Evaluation

## 2018-12-11 NOTE — Anesthesia Procedure Notes (Signed)
Procedure Name: LMA Insertion Date/Time: 12/11/2018 11:30 AM Performed by: Suan Halter, CRNA Pre-anesthesia Checklist: Patient identified, Emergency Drugs available, Suction available and Patient being monitored Patient Re-evaluated:Patient Re-evaluated prior to induction Oxygen Delivery Method: Circle system utilized Preoxygenation: Pre-oxygenation with 100% oxygen Induction Type: IV induction Ventilation: Mask ventilation without difficulty LMA: LMA inserted LMA Size: 4.0 Number of attempts: 1 Airway Equipment and Method: Bite block Placement Confirmation: positive ETCO2 Tube secured with: Tape Dental Injury: Teeth and Oropharynx as per pre-operative assessment

## 2018-12-11 NOTE — Progress Notes (Signed)
Assisted Dr. Rose with left, ultrasound guided, supraclavicular block. Side rails up, monitors on throughout procedure. See vital signs in flow sheet. Tolerated Procedure well. 

## 2018-12-11 NOTE — Anesthesia Postprocedure Evaluation (Signed)
Anesthesia Post Note  Patient: Lindsey [name]  Procedure(s) Performed: OPEN REDUCTION INTERNAL FIXATION (ORIF) LEFT PROXIMAL ULNA FRACTURE AND  RADIAL HEAD REPLACEMENT (Left Arm Lower) OPEN REDUCTION INTERNAL FIXATION (ORIF) LEFT PROXIMAL ULNA FRACTURE AND  RADIAL HEAD REPLACEMENT (Left Elbow)     Patient location during evaluation: PACU Anesthesia Type: General Level of consciousness: awake and alert Pain management: pain level controlled Vital Signs Assessment: post-procedure vital signs reviewed and stable Respiratory status: spontaneous breathing, nonlabored ventilation, respiratory function stable and patient connected to nasal cannula oxygen Cardiovascular status: blood pressure returned to baseline and stable Postop Assessment: no apparent nausea or vomiting Anesthetic complications: no    Last Vitals:  Vitals:   12/11/18 1400 12/11/18 1415  BP: 117/62 125/79  Pulse: 96 93  Resp: 17 14  Temp:    SpO2: 96% 94%    Last Pain:  Vitals:   12/11/18 1400  TempSrc:   PainSc: 0-No pain                 Greg Cratty S

## 2018-12-11 NOTE — Anesthesia Procedure Notes (Signed)
Anesthesia Regional Block: Supraclavicular block   Pre-Anesthetic Checklist: ,, timeout performed, Correct Patient, Correct Site, Correct Laterality, Correct Procedure, Correct Position, site marked, Risks and benefits discussed,  Surgical consent,  Pre-op evaluation,  At surgeon's request and post-op pain management  Laterality: Left  Prep: chloraprep       Needles:  Injection technique: Single-shot  Needle Type: Echogenic Needle     Needle Length: 9cm      Additional Needles:   Procedures:,,,, ultrasound used (permanent image in chart),,,,  Narrative:  Start time: 12/11/2018 10:51 AM End time: 12/11/2018 11:01 AM Injection made incrementally with aspirations every 5 mL.  Performed by: Personally  Anesthesiologist: Myrtie Soman, MD  Additional Notes: Patient tolerated the procedure well without complications

## 2018-12-11 NOTE — Discharge Instructions (Signed)
Discharge Instructions   You have a bulky dressing on your hand and elbow.  You Batzel begin gentle motion of your fingers and hand immediately, but you should not do any heavy lifting or gripping. You Percival also begin gentle range of motion of the elbow out of the sling in the bandage.  Elevate your hand to reduce pain & swelling of the digits.  Ice over the operative site Munar be helpful to reduce pain & swelling.  DO NOT USE HEAT. Pain medicine has been prescribed for you. It was sent to [facility] on [address] in [place]. Take Tylenol 650 mg and Ibuprofen 600 mg every 6 hours together. Take Oxycodone additionally for severe post operative pain. Leave the dressing in place until you return to our clinic for first post operative appointment. Use sling for comfort, taking it off for range of motion exercises. Cover the bandage and keep it clean and dry for showering. You Schuknecht drive a car when you are off of prescription pain medications and can safely control your vehicle with both hands. We will address whether therapy will be required or not when you return to the office. You Skiff have already made your follow-up appointment when we completed your preop visit.  If not, please call our office today or the next business day to make your return appointment for 10-15 days after surgery.   Please call [phone] during normal business hours or [phone] after hours for any problems. Including the following:  - excessive redness of the incisions - drainage for more than 4 days - fever of more than 101.5 F  *Please note that pain medications will not be refilled after hours or on weekends.      Regional Anesthesia Blocks  1. Numbness or the inability to move the "blocked" extremity Tolliver last from 3-48 hours after placement. The length of time depends on the medication injected and your individual response to the medication. If the numbness is not going away after 48 hours, call your  surgeon.  2. The extremity that is blocked will need to be protected until the numbness is gone and the  Strength has returned. Because you cannot feel it, you will need to take extra care to avoid injury. Because it Salaam be weak, you Groninger have difficulty moving it or using it. You Mcguire not know what position it is in without looking at it while the block is in effect.  3. For blocks in the legs and feet, returning to weight bearing and walking needs to be done carefully. You will need to wait until the numbness is entirely gone and the strength has returned. You should be able to move your leg and foot normally before you try and bear weight or walk. You will need someone to be with you when you first try to ensure you do not fall and possibly risk injury.  4. Bruising and tenderness at the needle site are common side effects and will resolve in a few days.  5. Persistent numbness or new problems with movement should be communicated to the surgeon or the [place] [phone] [place] [phone]).        Post Anesthesia Home Care Instructions  Activity: Get plenty of rest for the remainder of the day. A responsible individual must stay with you for 24 hours following the procedure.  For the next 24 hours, DO NOT: -Drive a car -Paediatric nurse -Drink alcoholic beverages -Take any medication unless  instructed by your physician -Make any legal decisions or sign important papers.  Meals: Start with liquid foods such as gelatin or soup. Progress to regular foods as tolerated. Avoid greasy, spicy, heavy foods. If nausea and/or vomiting occur, drink only clear liquids until the nausea and/or vomiting subsides. Call your physician if vomiting continues.  Special Instructions/Symptoms: Your throat Copenhaver feel dry or sore from the anesthesia or the breathing tube placed in your throat during surgery. If this causes discomfort, gargle with warm salt water. The  discomfort should disappear within 24 hours.  If you had a scopolamine patch placed behind your ear for the management of post- operative nausea and/or vomiting:  1. The medication in the patch is effective for 72 hours, after which it should be removed.  Wrap patch in a tissue and discard in the trash. Wash hands thoroughly with soap and water. 2. You Stegemann remove the patch earlier than 72 hours if you experience unpleasant side effects which Loden include dry mouth, dizziness or visual disturbances. 3. Avoid touching the patch. Wash your hands with soap and water after contact with the patch.

## 2018-12-11 NOTE — Op Note (Signed)
12/11/2018  11:05 AM  PATIENT:  Lindsey [name]  77 y.o. female  PRE-OPERATIVE DIAGNOSIS: Displaced left proximal ulna and radial neck fractures  POST-OPERATIVE DIAGNOSIS:  Same  PROCEDURE:   1.  ORIF left proximal ulna fracture    2.  Treatment of left radial neck fracture with prosthetic replacement  SURGEON: Rayvon Char. Grandville Silos, MD  PHYSICIAN ASSISTANT: Morley Kos, OPA-C  ANESTHESIA:  regional and general  SPECIMENS:  None  DRAINS:   None  EBL:  less than 50 mL  PREOPERATIVE INDICATIONS:  Lindsey [name] is a  78 y.o. female with displaced angulated left proximal ulna and radial neck fractures following a mechanical fall last week on Thursday the 16th.  She presents today for definitive surgical management.  The risks benefits and alternatives were discussed with the patient preoperatively including but not limited to the risks of infection, bleeding, nerve injury, cardiopulmonary complications, the need for revision surgery, among others, and the patient verbalized understanding and consented to proceed.  OPERATIVE IMPLANTS: Biomet extra long proximal ulna plate/screws (combination of locking and nonlocking) and Biomet prosthetic radial head implant, size 22/10 with size 8 standard short neck  OPERATIVE PROCEDURE:  After receiving prophylactic antibiotics and a regional block, the patient was escorted to the operative theatre and placed in a supine position.  General anesthesia was administered.  A surgical "time-out" was performed during which the planned procedure, proposed operative site, and the correct patient identity were compared to the operative consent and agreement confirmed by the circulating nurse according to current facility policy.  She was repositioned in a floppy lateral on a beanbag, left side up, with care to pad the appropriate pressure points.  Following application of a tourniquet to the operative extremity, the exposed skin was prepped with Chloraprep and  draped in the usual sterile fashion.  The limb was exsanguinated with an Esmarch bandage and the tourniquet inflated to approximately 118mmHg higher than systolic BP.  A direct posterior midline approach was performed with a combination of scalpel and Bovie electrocautery.  Full-thickness flaps were elevated.  There was a small transverse rent in the fascia corresponding to the level of fracture.  The subcutaneous border of the ulna was located and the fascia split overlying it, reflecting fascia and muscle off the ulna exposing it.  The fracture was exposed and cleaned of debris.  Provisional reduction was performed and there was no block to reduction.  A small linear posterior incision was made in the distal aspect of the triceps to excepted the proximal portion of the plate.  The extra long plate was selected.  It was held provisionally in place, the fracture reduced and clamped to the plate and provisional fixation with a large Steinmann pin placed in the most proximal hole through a centering guide.  Next, provisional screw was placed in 1 of the slotted holes in the distal fragment, very distally in the slot so that additional migration of the plate could occur.  Fluoroscopy was used to assess this provisional reduction it was found to be acceptable.  An additional point of fixation was placed proximally, and the first 1 removed and replaced with a screw, very closely applying the plate to the posterior aspect of the olecranon.  The fracture was held reduced to the plate with clamps and a variety of distal screw holes and proximal screw holes were filled largely with locking screws after the initial compressing had been performed.  Not all the screw holes were filled.  Extra fast guides were removed and final images obtained revealed near anatomic alignment of the ulna fracture.  The radial neck fracture was still angulated however.  Attention was shifted to the proximal radius, where Kocher's interval was  employed through the deep fascia and the muscles, dividing the annular ligament and exposing the fracture.  The head was removed and sized to be a 22.  The neck was not quite short enough, and so it was shortened so that a 14 mm resection space remained.  The canal was broached up to a size 8.  Trial was performed and found to be acceptable.  Trial implants were removed, the wound irrigated and final implants placed.  The joint moves smoothly and fluidly through full flexion extension as well as pronation and supination and certainly was not overstuffed.  Final images were obtained.  The wounds were irrigated and the deep layer closed with 2-0 Vicryl suture.  This included loose approximation of the annular ligament.  The fascial layers were then closed with running 2-0 Vicryl suture.  Tourniquet was released and additional hemostasis obtained with Bovie electrocautery and the skin flaps reapproximated with 2-0 Vicryl sutures and staples.  Long-arm bulky dressing was applied followed by a sling and she was awakened and taken to the recovery room in stable condition, breathing spontaneously.  DISPOSITION: She will be discharged home adaptable instructions, returning in 10 to 15 days with follow-on hand therapy appointment.  At the appointment, she should have new x-rays of the left elbow out of sling and dressing.

## 2018-12-11 NOTE — Transfer of Care (Signed)
Immediate Anesthesia Transfer of Care Note  Patient: Lindsey [name]  Procedure(s) Performed: Procedure(s) (LRB): OPEN REDUCTION INTERNAL FIXATION (ORIF) LEFT PROXIMAL ULNA FRACTURE AND  RADIAL HEAD REPLACEMENT (Left) OPEN REDUCTION INTERNAL FIXATION (ORIF) LEFT PROXIMAL ULNA FRACTURE AND  RADIAL HEAD REPLACEMENT (Left)  Patient Location: PACU  Anesthesia Type: General  Level of Consciousness: awake, oriented, sedated and patient cooperative  Airway & Oxygen Therapy: Patient Spontanous Breathing and Patient connected to face mask oxygen  Post-op Assessment: Report given to PACU RN and Post -op Vital signs reviewed and stable  Post vital signs: Reviewed and stable  Complications: No apparent anesthesia complications  Last Vitals:  Vitals Value Taken Time  BP 123/90 12/11/2018  1:33 PM  Temp    Pulse 96 12/11/2018  1:37 PM  Resp 17 12/11/2018  1:37 PM  SpO2 99 % 12/11/2018  1:37 PM  Vitals shown include unvalidated device data.  Last Pain:  Vitals:   12/11/18 1106  TempSrc: Oral  PainSc: 0-No pain      Patients Stated Pain Goal: 3 (12/11/18 1106)

## 2018-12-11 NOTE — Anesthesia Procedure Notes (Signed)
Anesthesia Procedure Image    

## 2018-12-12 ENCOUNTER — Encounter (HOSPITAL_BASED_OUTPATIENT_CLINIC_OR_DEPARTMENT_OTHER): Payer: Self-pay | Admitting: Orthopedic Surgery

## 2018-12-12 ENCOUNTER — Encounter: Admission: RE | Payer: Self-pay | Source: Home / Self Care

## 2018-12-12 ENCOUNTER — Ambulatory Visit: Admission: RE | Admit: 2018-12-12 | Payer: PPO | Source: Home / Self Care | Admitting: Surgery

## 2018-12-12 SURGERY — OPEN REDUCTION INTERNAL FIXATION (ORIF) ELBOW/OLECRANON FRACTURE
Anesthesia: General | Laterality: Left

## 2018-12-21 DIAGNOSIS — Z96629 Presence of unspecified artificial elbow joint: Secondary | ICD-10-CM | POA: Diagnosis not present

## 2018-12-21 DIAGNOSIS — S52602D Unspecified fracture of lower end of left ulna, subsequent encounter for closed fracture with routine healing: Secondary | ICD-10-CM | POA: Diagnosis not present

## 2018-12-21 DIAGNOSIS — S52092A Other fracture of upper end of left ulna, initial encounter for closed fracture: Secondary | ICD-10-CM | POA: Diagnosis not present

## 2018-12-27 DIAGNOSIS — Z96629 Presence of unspecified artificial elbow joint: Secondary | ICD-10-CM | POA: Diagnosis not present

## 2018-12-27 DIAGNOSIS — S52602D Unspecified fracture of lower end of left ulna, subsequent encounter for closed fracture with routine healing: Secondary | ICD-10-CM | POA: Diagnosis not present

## 2018-12-28 DIAGNOSIS — S52092A Other fracture of upper end of left ulna, initial encounter for closed fracture: Secondary | ICD-10-CM | POA: Diagnosis not present

## 2019-01-02 DIAGNOSIS — S52602D Unspecified fracture of lower end of left ulna, subsequent encounter for closed fracture with routine healing: Secondary | ICD-10-CM | POA: Diagnosis not present

## 2019-01-02 DIAGNOSIS — Z96629 Presence of unspecified artificial elbow joint: Secondary | ICD-10-CM | POA: Diagnosis not present

## 2019-01-04 DIAGNOSIS — S52602D Unspecified fracture of lower end of left ulna, subsequent encounter for closed fracture with routine healing: Secondary | ICD-10-CM | POA: Diagnosis not present

## 2019-01-04 DIAGNOSIS — Z96629 Presence of unspecified artificial elbow joint: Secondary | ICD-10-CM | POA: Diagnosis not present

## 2019-01-06 ENCOUNTER — Other Ambulatory Visit: Payer: Self-pay | Admitting: Family Medicine

## 2019-01-06 ENCOUNTER — Other Ambulatory Visit: Payer: Self-pay | Admitting: General Surgery

## 2019-01-06 DIAGNOSIS — E039 Hypothyroidism, unspecified: Secondary | ICD-10-CM

## 2019-01-08 DIAGNOSIS — S52602D Unspecified fracture of lower end of left ulna, subsequent encounter for closed fracture with routine healing: Secondary | ICD-10-CM | POA: Diagnosis not present

## 2019-01-08 DIAGNOSIS — M1711 Unilateral primary osteoarthritis, right knee: Secondary | ICD-10-CM | POA: Diagnosis not present

## 2019-01-08 DIAGNOSIS — M1712 Unilateral primary osteoarthritis, left knee: Secondary | ICD-10-CM | POA: Diagnosis not present

## 2019-01-08 DIAGNOSIS — Z96629 Presence of unspecified artificial elbow joint: Secondary | ICD-10-CM | POA: Diagnosis not present

## 2019-01-09 ENCOUNTER — Telehealth: Payer: Self-pay

## 2019-01-09 NOTE — Telephone Encounter (Signed)
Patient was informed that Dr. Fleet Contras messaged Dr. Ancil Boozer that Mrs. Cayabyab has a arm fracture and need to have her osteoporosis treatments at  [facility]. Patient states she is taking physical therapy at [facility] for her left arm fracture 2 times a week.  Patient was encouraged to call and set up treatments and states she has to wait to for her daughter to set it up.

## 2019-01-10 DIAGNOSIS — Z96629 Presence of unspecified artificial elbow joint: Secondary | ICD-10-CM | POA: Diagnosis not present

## 2019-01-10 DIAGNOSIS — S52602D Unspecified fracture of lower end of left ulna, subsequent encounter for closed fracture with routine healing: Secondary | ICD-10-CM | POA: Diagnosis not present

## 2019-01-16 DIAGNOSIS — Z96629 Presence of unspecified artificial elbow joint: Secondary | ICD-10-CM | POA: Diagnosis not present

## 2019-01-16 DIAGNOSIS — S52602D Unspecified fracture of lower end of left ulna, subsequent encounter for closed fracture with routine healing: Secondary | ICD-10-CM | POA: Diagnosis not present

## 2019-01-17 DIAGNOSIS — M1712 Unilateral primary osteoarthritis, left knee: Secondary | ICD-10-CM | POA: Diagnosis not present

## 2019-01-17 DIAGNOSIS — M1711 Unilateral primary osteoarthritis, right knee: Secondary | ICD-10-CM | POA: Diagnosis not present

## 2019-01-18 DIAGNOSIS — Z96629 Presence of unspecified artificial elbow joint: Secondary | ICD-10-CM | POA: Diagnosis not present

## 2019-01-18 DIAGNOSIS — S52602D Unspecified fracture of lower end of left ulna, subsequent encounter for closed fracture with routine healing: Secondary | ICD-10-CM | POA: Diagnosis not present

## 2019-01-24 DIAGNOSIS — M1711 Unilateral primary osteoarthritis, right knee: Secondary | ICD-10-CM | POA: Diagnosis not present

## 2019-01-24 DIAGNOSIS — S52602D Unspecified fracture of lower end of left ulna, subsequent encounter for closed fracture with routine healing: Secondary | ICD-10-CM | POA: Diagnosis not present

## 2019-01-24 DIAGNOSIS — Z96629 Presence of unspecified artificial elbow joint: Secondary | ICD-10-CM | POA: Diagnosis not present

## 2019-01-24 DIAGNOSIS — M1712 Unilateral primary osteoarthritis, left knee: Secondary | ICD-10-CM | POA: Diagnosis not present

## 2019-01-26 DIAGNOSIS — S52602D Unspecified fracture of lower end of left ulna, subsequent encounter for closed fracture with routine healing: Secondary | ICD-10-CM | POA: Diagnosis not present

## 2019-01-26 DIAGNOSIS — Z96629 Presence of unspecified artificial elbow joint: Secondary | ICD-10-CM | POA: Diagnosis not present

## 2019-01-30 DIAGNOSIS — S52092A Other fracture of upper end of left ulna, initial encounter for closed fracture: Secondary | ICD-10-CM | POA: Diagnosis not present

## 2019-02-01 DIAGNOSIS — S52602D Unspecified fracture of lower end of left ulna, subsequent encounter for closed fracture with routine healing: Secondary | ICD-10-CM | POA: Diagnosis not present

## 2019-02-01 DIAGNOSIS — Z96629 Presence of unspecified artificial elbow joint: Secondary | ICD-10-CM | POA: Diagnosis not present

## 2019-02-03 ENCOUNTER — Other Ambulatory Visit: Payer: Self-pay | Admitting: Family Medicine

## 2019-02-03 DIAGNOSIS — F331 Major depressive disorder, recurrent, moderate: Secondary | ICD-10-CM

## 2019-02-03 DIAGNOSIS — E1129 Type 2 diabetes mellitus with other diabetic kidney complication: Secondary | ICD-10-CM

## 2019-02-03 DIAGNOSIS — R809 Proteinuria, unspecified: Secondary | ICD-10-CM

## 2019-02-03 DIAGNOSIS — E1169 Type 2 diabetes mellitus with other specified complication: Secondary | ICD-10-CM

## 2019-02-03 DIAGNOSIS — G4709 Other insomnia: Secondary | ICD-10-CM

## 2019-02-05 DIAGNOSIS — Z96629 Presence of unspecified artificial elbow joint: Secondary | ICD-10-CM | POA: Diagnosis not present

## 2019-02-05 DIAGNOSIS — S52602D Unspecified fracture of lower end of left ulna, subsequent encounter for closed fracture with routine healing: Secondary | ICD-10-CM | POA: Diagnosis not present

## 2019-02-05 NOTE — Telephone Encounter (Signed)
Not able to leave message no vm. Pt need to schedule appt for august per Dr Ancil Boozer

## 2019-02-06 NOTE — Telephone Encounter (Signed)
Patient is not taking Trazadone. Her Zoloft that she has is 100 mg, but she thinks that she could take 50 mg.

## 2019-02-08 DIAGNOSIS — Z96629 Presence of unspecified artificial elbow joint: Secondary | ICD-10-CM | POA: Diagnosis not present

## 2019-02-08 DIAGNOSIS — S52602D Unspecified fracture of lower end of left ulna, subsequent encounter for closed fracture with routine healing: Secondary | ICD-10-CM | POA: Diagnosis not present

## 2019-02-12 DIAGNOSIS — S52602D Unspecified fracture of lower end of left ulna, subsequent encounter for closed fracture with routine healing: Secondary | ICD-10-CM | POA: Diagnosis not present

## 2019-02-12 DIAGNOSIS — Z96629 Presence of unspecified artificial elbow joint: Secondary | ICD-10-CM | POA: Diagnosis not present

## 2019-02-16 DIAGNOSIS — Z96629 Presence of unspecified artificial elbow joint: Secondary | ICD-10-CM | POA: Diagnosis not present

## 2019-02-16 DIAGNOSIS — S52602D Unspecified fracture of lower end of left ulna, subsequent encounter for closed fracture with routine healing: Secondary | ICD-10-CM | POA: Diagnosis not present

## 2019-02-21 DIAGNOSIS — M170 Bilateral primary osteoarthritis of knee: Secondary | ICD-10-CM | POA: Diagnosis not present

## 2019-02-23 ENCOUNTER — Other Ambulatory Visit: Payer: Self-pay | Admitting: Family Medicine

## 2019-02-23 DIAGNOSIS — E785 Hyperlipidemia, unspecified: Secondary | ICD-10-CM

## 2019-02-23 DIAGNOSIS — E1169 Type 2 diabetes mellitus with other specified complication: Secondary | ICD-10-CM

## 2019-02-27 DIAGNOSIS — M1712 Unilateral primary osteoarthritis, left knee: Secondary | ICD-10-CM | POA: Diagnosis not present

## 2019-02-27 DIAGNOSIS — M6281 Muscle weakness (generalized): Secondary | ICD-10-CM | POA: Diagnosis not present

## 2019-02-27 DIAGNOSIS — M1711 Unilateral primary osteoarthritis, right knee: Secondary | ICD-10-CM | POA: Diagnosis not present

## 2019-03-01 DIAGNOSIS — M1711 Unilateral primary osteoarthritis, right knee: Secondary | ICD-10-CM | POA: Diagnosis not present

## 2019-03-01 DIAGNOSIS — M6281 Muscle weakness (generalized): Secondary | ICD-10-CM | POA: Diagnosis not present

## 2019-03-01 DIAGNOSIS — S52092D Other fracture of upper end of left ulna, subsequent encounter for closed fracture with routine healing: Secondary | ICD-10-CM | POA: Diagnosis not present

## 2019-03-01 DIAGNOSIS — M1712 Unilateral primary osteoarthritis, left knee: Secondary | ICD-10-CM | POA: Diagnosis not present

## 2019-03-05 DIAGNOSIS — M1712 Unilateral primary osteoarthritis, left knee: Secondary | ICD-10-CM | POA: Diagnosis not present

## 2019-03-05 DIAGNOSIS — M6281 Muscle weakness (generalized): Secondary | ICD-10-CM | POA: Diagnosis not present

## 2019-03-05 DIAGNOSIS — M1711 Unilateral primary osteoarthritis, right knee: Secondary | ICD-10-CM | POA: Diagnosis not present

## 2019-03-08 DIAGNOSIS — M6281 Muscle weakness (generalized): Secondary | ICD-10-CM | POA: Diagnosis not present

## 2019-03-08 DIAGNOSIS — M1711 Unilateral primary osteoarthritis, right knee: Secondary | ICD-10-CM | POA: Diagnosis not present

## 2019-03-08 DIAGNOSIS — M1712 Unilateral primary osteoarthritis, left knee: Secondary | ICD-10-CM | POA: Diagnosis not present

## 2019-03-12 DIAGNOSIS — M6281 Muscle weakness (generalized): Secondary | ICD-10-CM | POA: Diagnosis not present

## 2019-03-12 DIAGNOSIS — M1712 Unilateral primary osteoarthritis, left knee: Secondary | ICD-10-CM | POA: Diagnosis not present

## 2019-03-12 DIAGNOSIS — M1711 Unilateral primary osteoarthritis, right knee: Secondary | ICD-10-CM | POA: Diagnosis not present

## 2019-03-13 ENCOUNTER — Encounter: Payer: Self-pay | Admitting: General Surgery

## 2019-03-15 DIAGNOSIS — M1711 Unilateral primary osteoarthritis, right knee: Secondary | ICD-10-CM | POA: Diagnosis not present

## 2019-03-15 DIAGNOSIS — M6281 Muscle weakness (generalized): Secondary | ICD-10-CM | POA: Diagnosis not present

## 2019-03-15 DIAGNOSIS — M1712 Unilateral primary osteoarthritis, left knee: Secondary | ICD-10-CM | POA: Diagnosis not present

## 2019-03-19 DIAGNOSIS — M6281 Muscle weakness (generalized): Secondary | ICD-10-CM | POA: Diagnosis not present

## 2019-03-19 DIAGNOSIS — M1712 Unilateral primary osteoarthritis, left knee: Secondary | ICD-10-CM | POA: Diagnosis not present

## 2019-03-19 DIAGNOSIS — M1711 Unilateral primary osteoarthritis, right knee: Secondary | ICD-10-CM | POA: Diagnosis not present

## 2019-03-22 DIAGNOSIS — M1711 Unilateral primary osteoarthritis, right knee: Secondary | ICD-10-CM | POA: Diagnosis not present

## 2019-03-22 DIAGNOSIS — M1712 Unilateral primary osteoarthritis, left knee: Secondary | ICD-10-CM | POA: Diagnosis not present

## 2019-03-22 DIAGNOSIS — M6281 Muscle weakness (generalized): Secondary | ICD-10-CM | POA: Diagnosis not present

## 2019-03-28 DIAGNOSIS — M6281 Muscle weakness (generalized): Secondary | ICD-10-CM | POA: Diagnosis not present

## 2019-03-28 DIAGNOSIS — M1711 Unilateral primary osteoarthritis, right knee: Secondary | ICD-10-CM | POA: Diagnosis not present

## 2019-03-28 DIAGNOSIS — M1712 Unilateral primary osteoarthritis, left knee: Secondary | ICD-10-CM | POA: Diagnosis not present

## 2019-03-30 DIAGNOSIS — M1712 Unilateral primary osteoarthritis, left knee: Secondary | ICD-10-CM | POA: Diagnosis not present

## 2019-03-30 DIAGNOSIS — M6281 Muscle weakness (generalized): Secondary | ICD-10-CM | POA: Diagnosis not present

## 2019-03-30 DIAGNOSIS — M1711 Unilateral primary osteoarthritis, right knee: Secondary | ICD-10-CM | POA: Diagnosis not present

## 2019-04-04 DIAGNOSIS — M1712 Unilateral primary osteoarthritis, left knee: Secondary | ICD-10-CM | POA: Diagnosis not present

## 2019-04-04 DIAGNOSIS — M1711 Unilateral primary osteoarthritis, right knee: Secondary | ICD-10-CM | POA: Diagnosis not present

## 2019-04-04 DIAGNOSIS — M6281 Muscle weakness (generalized): Secondary | ICD-10-CM | POA: Diagnosis not present

## 2019-04-06 DIAGNOSIS — M1712 Unilateral primary osteoarthritis, left knee: Secondary | ICD-10-CM | POA: Diagnosis not present

## 2019-04-06 DIAGNOSIS — M1711 Unilateral primary osteoarthritis, right knee: Secondary | ICD-10-CM | POA: Diagnosis not present

## 2019-04-06 DIAGNOSIS — M6281 Muscle weakness (generalized): Secondary | ICD-10-CM | POA: Diagnosis not present

## 2019-04-09 DIAGNOSIS — M1712 Unilateral primary osteoarthritis, left knee: Secondary | ICD-10-CM | POA: Diagnosis not present

## 2019-04-09 DIAGNOSIS — M1711 Unilateral primary osteoarthritis, right knee: Secondary | ICD-10-CM | POA: Diagnosis not present

## 2019-04-09 DIAGNOSIS — M6281 Muscle weakness (generalized): Secondary | ICD-10-CM | POA: Diagnosis not present

## 2019-04-12 DIAGNOSIS — M1711 Unilateral primary osteoarthritis, right knee: Secondary | ICD-10-CM | POA: Diagnosis not present

## 2019-04-12 DIAGNOSIS — M1712 Unilateral primary osteoarthritis, left knee: Secondary | ICD-10-CM | POA: Diagnosis not present

## 2019-04-12 DIAGNOSIS — M6281 Muscle weakness (generalized): Secondary | ICD-10-CM | POA: Diagnosis not present

## 2019-04-12 DIAGNOSIS — M1812 Unilateral primary osteoarthritis of first carpometacarpal joint, left hand: Secondary | ICD-10-CM | POA: Diagnosis not present

## 2019-04-25 ENCOUNTER — Other Ambulatory Visit: Payer: Self-pay | Admitting: Family Medicine

## 2019-04-25 DIAGNOSIS — E039 Hypothyroidism, unspecified: Secondary | ICD-10-CM

## 2019-05-04 ENCOUNTER — Telehealth: Payer: Self-pay | Admitting: Family Medicine

## 2019-05-04 NOTE — Chronic Care Management (AMB) (Signed)
Chronic Care Management   Note  05/04/2019 Name: Lindsey [name] MRN: [id number] DOB: Verstraete 14, 1942  Lindsey [name] is a 78 y.o. year old female who is a primary care patient of [name], MD. I reached out to [place] by phone today in response to a referral sent by Lindsey [name]'s health plan.    [name] was given information about Chronic Care Management services today including:  1. CCM service includes personalized support from designated clinical staff supervised by her physician, including individualized plan of care and coordination with other care providers 2. 24/7 contact phone numbers for assistance for urgent and routine care needs. 3. Service will only be billed when office clinical staff spend 20 minutes or more in a month to coordinate care. 4. Only one practitioner Kleve furnish and bill the service in a calendar month. 5. [name] stop CCM services at any time (effective at the end of the month) by phone call to the office staff. 6. The patient will be responsible for cost sharing (co-pay) of up to 20% of the service fee (after annual deductible is met).  Patient agreed to services and verbal consent obtained.   Follow up plan: Telephone appointment with CCM team member scheduled for: 05/08/2019  [place]  [email or web address]   ??[phone]

## 2019-05-04 NOTE — Telephone Encounter (Signed)
This encounter was created in error - please disregard.

## 2019-05-04 NOTE — Chronic Care Management (AMB) (Signed)
Chronic Care Management   Note  05/04/2019 Name: [name] MRN: [id number] DOB: 02/18/1941  Lindsey [name] is a 78 y.o. year old female who is a primary care patient of [name], MD. I reached out to [place] by phone today in response to a referral sent by Lindsey [name]'s health plan.    Lindsey [name] was given information about Chronic Care Management services today including:  1. CCM service includes personalized support from designated clinical staff supervised by her physician, including individualized plan of care and coordination with other care providers 2. 24/7 contact phone numbers for assistance for urgent and routine care needs. 3. Service will only be billed when office clinical staff spend 20 minutes or more in a month to coordinate care. 4. Only one practitioner Chay furnish and bill the service in a calendar month. 5. The patient Travaglini stop CCM services at any time (effective at the end of the month) by phone call to the office staff. 6. The patient will be responsible for cost sharing (co-pay) of up to 20% of the service fee (after annual deductible is met).  Patient agreed to services and verbal consent obtained.   Follow up plan: Telephone appointment with CCM team member scheduled for: 05/10/2019  [place]  [email or web address]   ??[phone]

## 2019-05-04 NOTE — Addendum Note (Signed)
Addended by: Clerance Lav on: 05/04/2019 09:49 AM   Modules accepted: Level of Service, SmartSet

## 2019-05-08 ENCOUNTER — Ambulatory Visit (INDEPENDENT_AMBULATORY_CARE_PROVIDER_SITE_OTHER): Payer: PPO | Admitting: Pharmacist

## 2019-05-08 DIAGNOSIS — M810 Age-related osteoporosis without current pathological fracture: Secondary | ICD-10-CM

## 2019-05-08 DIAGNOSIS — I10 Essential (primary) hypertension: Secondary | ICD-10-CM

## 2019-05-09 NOTE — Patient Instructions (Signed)
Goals Addressed            This Visit's Progress   . Blood pressure (pt-stated)       Current Barriers:  [name] Knowledge Deficits related to medications for blood pressure  Pharmacist Clinical Goal(s):  [name] Over the next 30 days, patient will demonstrate improved blood pressure control by checking BP at home and working with CCM pharmacist for BP understanding   Interventions: . Comprehensive medication review performed. . Advised patient to try switching lisinopril-HCTZ to the morning to reduce getting up in the night . Check BP at home every night before bed  Patient Self Care Activities:  . Self administers medications as prescribed . Calls pharmacy for medication refills . Calls provider office for new concerns or questions  Initial goal documentation         Thank you allowing the Chronic Care Management Team to be a part of your care!   Please call a member of the CCM (Chronic Care Management) Team with any questions or case management needs:    [name], PharmD  Clinical Pharmacist  [phone]  Elliot Gurney, [place] Clinical Social Worker [phone]

## 2019-05-09 NOTE — Chronic Care Management (AMB) (Signed)
Chronic Care Management   Note  05/09/2019 Name: Lindsey [name] MRN: [id number] DOB: 16-Feb-1941  Subjective:  Lindsey [name] is a 78 year old patient of Dr. Ancil Boozer referred to CCM clinical pharmacist by her health plan. Initial pharmacy outreach today via telephone, HIPAA identifiers verified.  Of note, patient states she thinks she needs a new oncologist because Dr. Virgia Land retired. PMH of breast cancer (2017)  Objective: Lab Results  Component Value Date   CREATININE 0.86 11/27/2018   CREATININE 0.75 03/28/2018   CREATININE 0.84 11/24/2017    Lab Results  Component Value Date   HGBA1C 7.8 (H) 11/27/2018    Lipid Panel     Component Value Date/Time   CHOL 125 11/27/2018 1121   CHOL 151 12/29/2015 0954   TRIG 106 11/27/2018 1121   HDL 42 (L) 11/27/2018 1121   HDL 53 12/29/2015 0954   CHOLHDL 3.0 11/27/2018 1121   VLDL 16 01/27/2017 1128   LDLCALC 64 11/27/2018 1121    BP Readings from Last 3 Encounters:  12/11/18 137/65  12/07/18 (!) 190/95  11/27/18 (!) 158/94    No Known Allergies  Medications Reviewed Today    Reviewed by Cathi Roan, [place] (Pharmacist) on 05/09/19 at 0854  Med List Status: <None>  Medication Order Taking? Sig Documenting Provider Last Dose Status Informant  acetaminophen (TYLENOL) 325 MG tablet [id number] No Take 2 tablets (650 mg total) by mouth every 6 (six) hours.  Patient not taking: Reported on 05/08/2019   Lindsey Jakob, MD Not Taking Active   alendronate (FOSAMAX) 70 MG tablet [id number] Yes Take 70 mg by mouth once a week. [provider] Taking Active   aspirin 81 MG tablet [id number] Yes Take by mouth. [provider] Taking Active            Med Note Lindsey [name], TEMEKA L   Fri Apr 25, 2015 10:13 AM) Received from: [facility]  atorvastatin (LIPITOR) 40 MG tablet [id number] Yes TAKE 1 TABLET(40 MG) BY MOUTH DAILY [name], MD Taking Active            Med Note Lindsey [name], Anaisha Mago E   Tue May 08, 2019   2:54 PM) Night time  cholecalciferol (VITAMIN D) 1000 UNITS tablet [id number] Yes Take by mouth. [provider] Taking Active            Med Note Lindsey [name], TEMEKA L   Fri Apr 25, 2015 10:13 AM) Received from: [facility]  ibuprofen (ADVIL) 200 MG tablet [id number] No Take 3 tablets (600 mg total) by mouth every 6 (six) hours.  Patient not taking: Reported on 05/08/2019   Lindsey Jakob, MD Not Taking Active   ketoconazole (NIZORAL) 2 % cream [id number] Yes APPLY TO THE SKIN TWICE A DAY TO INFRAMAMMARY AND GROIN AS DIRECTED [provider] Taking Active            Med Note Lindsey [name], TEMEKA L   Tue Aug 26, 2015  9:11 AM) Received from: External Pharmacy  letrozole [facility]) 2.5 MG tablet [id number] Yes TAKE 1 TABLET(2.5 MG) BY MOUTH DAILY Byrnett, Forest Gleason, MD Taking Active   lisinopril-hydrochlorothiazide (PRINZIDE,ZESTORETIC) 20-12.5 MG tablet [id number] Yes Take 1 tablet by mouth daily. [name], MD Taking Active            Med Note Lindsey [name], Lindsey [name]   Tue May 08, 2019  2:58 PM) Dizziness; night  metFORMIN (GLUCOPHAGE) 850 MG tablet [id number] Yes TAKE 1 TABLET(850 MG) BY MOUTH TWICE DAILY WITH  A MEAL Sowles, Drue Stager, MD Taking Active   naproxen sodium (ALEVE) 220 MG tablet [id number] Yes Take 220 mg by mouth. [provider] Taking Active        Patient not taking:      Discontinued 05/09/19 0853 (Completed Course)        Patient not taking:      Discontinued 05/09/19 0853 (Completed Course)   sertraline (ZOLOFT) 100 MG tablet [id number] Yes Take 1 tablet (100 mg total) by mouth daily. [name], MD Taking Active            Med Note Lindsey [name] May 08, 2019  2:59 PM) night  SYNTHROID 112 MCG tablet [id number] Yes TAKE 1 TABLET(112 MCG) BY MOUTH DAILY [name], MD Taking Active   vitamin B-12 (CYANOCOBALAMIN) 1000 MCG tablet [id number] Yes Take 1,000 mcg by mouth every morning. [provider] Taking Active             Assessment:    Intervention  YES NO  Explanation   Safety/Adverse Med Event      Contraindication present []  []     Unsafe medication []  []     Allergic reaction []  []     Drug interaction []  []     Excessive dose/duration []  []     Undesirable side effect [x]  []  Orthostatic hypotension, during the night; complicated by PMH significant for osteoporosis   Other pertinent pharmacist  counseling      Goals Addressed            This Visit's Progress   . Blood pressure (pt-stated)       Current Barriers:  Lindsey [name] Knowledge Deficits related to medications for blood pressure  Pharmacist Clinical Goal(s):  Lindsey [name] Over the next 30 days, patient will demonstrate improved blood pressure control by checking BP at home and working with CCM pharmacist for BP understanding   Interventions: . Comprehensive medication review performed. . Advised patient to try switching lisinopril-HCTZ to the morning to reduce getting up in the night . Check BP at home every night before bed  Patient Self Care Activities:  . Self administers medications as prescribed . Calls pharmacy for medication refills . Calls provider office for new concerns or questions  Initial goal documentation        Plan:  Recommendations discussed with patient - move lisinopril/HCTZ to the morning  - start checking blood pressure at home every evening before bed and first thing in the morning   Follow up: Telephone follow up appointment with care management team member scheduled for: 2 weeks with PharmD for BP assessment  [name], PharmD Clinical Pharmacist [facility] [phone]

## 2019-05-10 ENCOUNTER — Telehealth: Payer: PPO

## 2019-05-22 ENCOUNTER — Ambulatory Visit: Payer: Self-pay | Admitting: Pharmacist

## 2019-05-26 ENCOUNTER — Other Ambulatory Visit: Payer: Self-pay | Admitting: Family Medicine

## 2019-05-26 DIAGNOSIS — I10 Essential (primary) hypertension: Secondary | ICD-10-CM

## 2019-05-26 DIAGNOSIS — F331 Major depressive disorder, recurrent, moderate: Secondary | ICD-10-CM

## 2019-05-26 NOTE — Chronic Care Management (AMB) (Signed)
  Chronic Care Management   Note  05/26/2019 Name: Lindsey [name] MRN: [id number] DOB: 1941-02-19  78 y.o. year old female followed by CCM clinical pharmacist for HTN management. Follow up outreach today.   Was unable to reach patient via telephone today and have left HIPAA compliant voicemail asking patient to return my call. (unsuccessful outreach #1).  Follow up plan: A HIPPA compliant phone message was left for the patient providing contact information and requesting a return call.  The care management team will reach out to the patient again over the next 5-7 days.   [name], PharmD Clinical Pharmacist [facility] [phone]

## 2019-05-29 ENCOUNTER — Ambulatory Visit: Payer: Self-pay | Admitting: Pharmacist

## 2019-05-30 ENCOUNTER — Other Ambulatory Visit: Payer: Self-pay

## 2019-05-30 ENCOUNTER — Ambulatory Visit (INDEPENDENT_AMBULATORY_CARE_PROVIDER_SITE_OTHER): Payer: PPO | Admitting: Family Medicine

## 2019-05-30 ENCOUNTER — Encounter: Payer: Self-pay | Admitting: Family Medicine

## 2019-05-30 VITALS — BP 120/80 | HR 72 | Temp 97.1°F | Resp 16 | Ht 61.0 in | Wt 186.5 lb

## 2019-05-30 DIAGNOSIS — F325 Major depressive disorder, single episode, in full remission: Secondary | ICD-10-CM | POA: Diagnosis not present

## 2019-05-30 DIAGNOSIS — E1169 Type 2 diabetes mellitus with other specified complication: Secondary | ICD-10-CM

## 2019-05-30 DIAGNOSIS — E785 Hyperlipidemia, unspecified: Secondary | ICD-10-CM

## 2019-05-30 DIAGNOSIS — M810 Age-related osteoporosis without current pathological fracture: Secondary | ICD-10-CM

## 2019-05-30 DIAGNOSIS — E1129 Type 2 diabetes mellitus with other diabetic kidney complication: Secondary | ICD-10-CM

## 2019-05-30 DIAGNOSIS — Z23 Encounter for immunization: Secondary | ICD-10-CM | POA: Diagnosis not present

## 2019-05-30 DIAGNOSIS — E039 Hypothyroidism, unspecified: Secondary | ICD-10-CM | POA: Diagnosis not present

## 2019-05-30 DIAGNOSIS — I10 Essential (primary) hypertension: Secondary | ICD-10-CM | POA: Diagnosis not present

## 2019-05-30 DIAGNOSIS — R809 Proteinuria, unspecified: Secondary | ICD-10-CM | POA: Diagnosis not present

## 2019-05-30 LAB — POCT GLYCOSYLATED HEMOGLOBIN (HGB A1C): Hemoglobin A1C: 7.3 % — AB (ref 4.0–5.6)

## 2019-05-30 MED ORDER — SITAGLIPTIN PHOSPHATE 100 MG PO TABS: 100.0000 mg | ORAL_TABLET | Freq: Every day | ORAL | 0 refills | Status: DC

## 2019-05-30 MED ORDER — SYNTHROID 112 MCG PO TABS: 112.0000 ug | ORAL_TABLET | Freq: Every day | ORAL | 0 refills | Status: DC

## 2019-05-30 NOTE — Progress Notes (Signed)
Name: Lindsey [name]   MRN: [id number]    DOB: [birth year]   Date:05/30/2019       Progress Note  Subjective  Chief Complaint  Chief Complaint  Patient presents with  . Medication Refill  . Diabetes  . Hypertension  . Hypothyroidism  . Hyperlipidemia    HPI  DMII: she used to have microalbuminuria, but has been on ACE and is doing well, last urine micro negative. Also low HDL, on statin therapy. Denies polyphagia, polyuria or polydipsia. She does not check her glucose at home. She is taking aspirin ( discussed new guidelines -but she wants to continue medication). HgbA1C went from 7.1 % to 7.8% during pandemic today it was 7.3%  we changedfrom Januvia 50 mg and Metformin 500 mg bid to Metformin 850 mg twice daily to off set cost back in nov 2019. Explained that Metformin could be the cause of explosive diarrhea, we will try going back on Januvia 100 mg and also needs to follow a diabetic diet .  Breast cancer: had on the right side in 1983, treated withmastectomy ,and had recurrence on the left in 2017and had mastectomy of the left , she has been on Femara since, under the care of Dr. Fleet Contras. No changes .   HTN: she is on  lisinopril hctz 20/12.5 and bp is at goal, no chest pain or palpitation   Hypothyroidism: she has been on Synthroid 112 mcg for many years.She has occasional dysphagia after taking medication,last TSH at goal.She has hair loss and dry skin, but unchanged   Osteoporosis:last bone density showed osteoporosis, she was on fosamax but never got refills , she was seen by Dr. Gabriel Carina but never got approved for Prolia , she is on oral medication   Major Depression: States stressed out because husband is sick, difficulty with transportation since she never learned how to drive.She has a daughter but does not come over frequently, no longer can go to church. Last visit we adjusted dose to 100 mg and she is doing better now   Bowel incontinence: resolved after   Colonoscopy, but now has explosive diarrhea when she first gets up in am, advised follow up with GI, I cannot find her colonoscopy report. Not even under care everywhere . We will stop Metformin to see if diarrhea resolves    Patient Active Problem List   Diagnosis Date Noted  . History of breast cancer 06/28/2018  . Bilateral lower extremity edema 01/16/2016  . Breast cancer, left breast ([place]) 11/05/2015  . 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 Morbid (severe) obesity due to excess calories ([place]) 08/26/2015  . Controlled type 2 diabetes mellitus with microalbuminuria ([place]) 04/25/2015  . Hyperlipidemia 04/25/2015  . Benign hypertension 04/25/2015  . Adult hypothyroidism 04/25/2015  . At risk for falling 04/25/2015  . Major depression ([place]) 04/25/2015  . Post menopausal syndrome 04/25/2015    Past Surgical History:  Procedure Laterality Date  . BREAST BIOPSY Left 11-04-15  . BREAST BIOPSY Left 10-10-07  . CATARACT EXTRACTION Bilateral 1990  . CHOLECYSTECTOMY    . MASTECTOMY Right 1983   Dr Myrle Sheng  . MASTECTOMY Right 1983  . MASTECTOMY W/ SENTINEL NODE BIOPSY Left 12/12/2015   Procedure: MASTECTOMY WITH SENTINEL LYMPH NODE BIOPSY;  Surgeon: Christene Lye, MD;  Location: [facility];  Service: General;  Laterality: Left;  . ORIF ULNAR FRACTURE Left 12/11/2018   Procedure: OPEN REDUCTION INTERNAL FIXATION (ORIF) LEFT PROXIMAL ULNA FRACTURE AND  RADIAL HEAD REPLACEMENT;  Surgeon: Grandville Silos,  Shanon Brow, MD;  Location: [place];  Service: Orthopedics;  Laterality: Left;  . RADIAL HEAD ARTHROPLASTY Left 12/11/2018   Procedure: OPEN REDUCTION INTERNAL FIXATION (ORIF) LEFT PROXIMAL ULNA FRACTURE AND  RADIAL HEAD REPLACEMENT;  Surgeon: Milly Jakob, MD;  Location: [place];  Service: Orthopedics;  Laterality: Left;    Family History  Problem Relation Age of Onset  . Kidney disease Mother   . Heart disease Father   . Stroke Sister   . Cancer  Sister        breast  . Breast cancer Sister   . Stroke Brother   . Diabetes Brother   . Hypertension Brother   . Cancer Daughter 35       DCIS/lumpectomy/genetic negative  . Breast cancer Daughter     Social History   Socioeconomic History  . Marital status: Married    Spouse name: Not on file  . Number of children: 3  . Years of education: Not on file  . Highest education level: Not on file  Occupational History  . Occupation: Retired  Scientific laboratory technician  . Financial resource strain: Not hard at all  . Food insecurity    Worry: Never true    Inability: Never true  . Transportation needs    Medical: No    Non-medical: No  Tobacco Use  . Smoking status: Never Smoker  . Smokeless tobacco: Never Used  . Tobacco comment: Non-smoker. Smoking cessation materials contraindicated  Substance and Sexual Activity  . Alcohol use: No    Alcohol/week: 0.0 standard drinks  . Drug use: No  . Sexual activity: Never  Lifestyle  . Physical activity    Days per week: 0 days    Minutes per session: 0 min  . Stress: Rather much  Relationships  . Social Herbalist on phone: Once a week    Gets together: Never    Attends religious service: Never    Active member of club or organization: No    Attends meetings of clubs or organizations: Never    Relationship status: Married  . Intimate partner violence    Fear of current or ex partner: No    Emotionally abused: No    Physically abused: No    Forced sexual activity: No  Other Topics Concern  . Not on file  Social History Narrative  . Not on file     Current Outpatient Medications:  .  acetaminophen (TYLENOL) 325 MG tablet, Take 2 tablets (650 mg total) by mouth every 6 (six) hours., Disp: , Rfl:  .  alendronate (FOSAMAX) 70 MG tablet, Take 70 mg by mouth once a week., Disp: , Rfl:  .  aspirin 81 MG tablet, Take by mouth., Disp: , Rfl:  .  atorvastatin (LIPITOR) 40 MG tablet, TAKE 1 TABLET(40 MG) BY MOUTH DAILY, Disp: 90  tablet, Rfl: 1 .  cholecalciferol (VITAMIN D) 1000 UNITS tablet, Take by mouth., Disp: , Rfl:  .  ibuprofen (ADVIL) 200 MG tablet, Take 3 tablets (600 mg total) by mouth every 6 (six) hours., Disp: , Rfl: 0 .  ketoconazole (NIZORAL) 2 % cream, APPLY TO THE SKIN TWICE A DAY TO INFRAMAMMARY AND GROIN AS DIRECTED, Disp: , Rfl: 0 .  letrozole (FEMARA) 2.5 MG tablet, TAKE 1 TABLET(2.5 MG) BY MOUTH DAILY, Disp: 30 tablet, Rfl: 12 .  lisinopril-hydrochlorothiazide (ZESTORETIC) 20-12.5 MG tablet, TAKE 1 TABLET BY MOUTH DAILY, Disp: 90 tablet, Rfl: 1 .  naproxen sodium (ALEVE)  220 MG tablet, Take 220 mg by mouth., Disp: , Rfl:  .  sertraline (ZOLOFT) 100 MG tablet, TAKE 1 TABLET(100 MG) BY MOUTH DAILY, Disp: 90 tablet, Rfl: 1 .  SYNTHROID 112 MCG tablet, TAKE 1 TABLET(112 MCG) BY MOUTH DAILY, Disp: 90 tablet, Rfl: 0 .  vitamin B-12 (CYANOCOBALAMIN) 1000 MCG tablet, Take 1,000 mcg by mouth every morning., Disp: , Rfl:   No Known Allergies  I personally reviewed active problem list, medication list, allergies, family history, social history, health maintenance with the patient/caregiver today.   ROS  Constitutional: Negative for fever or weight change.  Respiratory: Negative for cough and shortness of breath.   Cardiovascular: Negative for chest pain or palpitations.  Gastrointestinal: Negative for abdominal pain, no bowel changes.  Musculoskeletal: Negative for gait problem or joint swelling.  Skin: Negative for rash.  Neurological: Negative for dizziness or headache.  No other specific complaints in a complete review of systems (except as listed in HPI above).   Objective  Vitals:   05/30/19 1027  BP: 120/80  Pulse: 72  Resp: 16  Temp: (!) 97.1 F (36.2 C)  TempSrc: Temporal  Weight: 186 lb 8 oz (84.6 kg)  Height: 5\' 1"  (1.549 m)    Body mass index is 35.24 kg/m.  Physical Exam  Constitutional: Patient appears well-developed and well-nourished. Obese  No distress.  HEENT: head  atraumatic, normocephalic, pupils equal and reactive to light Cardiovascular: Normal rate, regular rhythm and normal heart sounds.  No murmur heard. No BLE edema. Pulmonary/Chest: Effort normal and breath sounds normal. No respiratory distress. Abdominal: Soft.  There is no tenderness. Psychiatric: Patient has a normal mood and affect. behavior is normal. Judgment and thought content normal.  PHQ2/9: Depression screen [facility] 2/9 05/30/2019 06/28/2018 03/28/2018 11/24/2017 08/25/2017  Decreased Interest 0 2 0 0 0  Down, Depressed, Hopeless 0 2 0 0 0  PHQ - 2 Score 0 4 0 0 0  Altered sleeping 0 2 2 - -  Tired, decreased energy 0 3 2 - -  Change in appetite 0 2 1 - -  Feeling bad or failure about yourself  0 1 1 - -  Trouble concentrating 0 1 1 - -  Moving slowly or fidgety/restless 0 0 0 - -  Suicidal thoughts 0 0 0 - -  PHQ-9 Score 0 13 7 - -  Difficult doing work/chores - Somewhat difficult - - -    phq 9 is negative   Fall Risk: Fall Risk  06/28/2018 06/27/2018 03/28/2018 11/24/2017 08/25/2017  Falls in the past year? 0 0 No No No  Number falls in past yr: 0 - - - -  Injury with Fall? 0 - - - -  Risk for fall due to : - - - - -  Risk for fall due to: Comment - - - - -    Functional Status Survey: Is the patient deaf or have difficulty hearing?: No Does the patient have difficulty seeing, even when wearing glasses/contacts?: No Does the patient have difficulty concentrating, remembering, or making decisions?: No Does the patient have difficulty walking or climbing stairs?: No Does the patient have difficulty dressing or bathing?: No Does the patient have difficulty doing errands alone such as visiting a doctor's office or shopping?: No    Assessment & Plan  1. Controlled type 2 diabetes mellitus with microalbuminuria, without long-term current use of insulin (HCC)  - sitaGLIPtin (JANUVIA) 100 MG tablet; Take 1 tablet (100 mg total) by mouth daily.  Dispense: 90 tablet; Refill: 0  2.  Need for immunization against influenza  - Flu Vaccine QUAD High Dose(Fluad)  3. Benign hypertension  At goal   4. Dyslipidemia associated with type 2 diabetes mellitus ([place])  On statin therapy   5. Osteoporosis, post-menopausal  On fosamax   6. Major depression in remission [facility])  Doing better on medication

## 2019-06-01 NOTE — Chronic Care Management (AMB) (Signed)
  Chronic Care Management   Note  06/01/2019- late entry Name: Lindsey [name] MRN: [id number] DOB: August 07, 1941  78 y.o. year old female followed by CCM clinical pharmacist for HTN management. Follow up outreach today.   Was unable to reach patient via telephone today and have left HIPAA compliant voicemail asking patient to return my call. (unsuccessful outreach #2).  Follow up plan: A HIPPA compliant phone message was left for the patient providing contact information and requesting a return call.  The care management team will reach out to the patient again over the next 5-7 days.   [name], PharmD Clinical Pharmacist [facility] [phone]

## 2019-06-05 ENCOUNTER — Ambulatory Visit (INDEPENDENT_AMBULATORY_CARE_PROVIDER_SITE_OTHER): Payer: PPO | Admitting: Pharmacist

## 2019-06-05 DIAGNOSIS — I10 Essential (primary) hypertension: Secondary | ICD-10-CM | POA: Diagnosis not present

## 2019-06-05 DIAGNOSIS — R809 Proteinuria, unspecified: Secondary | ICD-10-CM | POA: Diagnosis not present

## 2019-06-05 DIAGNOSIS — E1129 Type 2 diabetes mellitus with other diabetic kidney complication: Secondary | ICD-10-CM

## 2019-06-06 NOTE — Chronic Care Management (AMB) (Signed)
Chronic Care Management   Follow Up Note   06/06/2019 Name: Lindsey [name] MRN: [id number] DOB: 1941-01-27  Subjective Lindsey [name] is a 78 y.o. year old female who is a primary care patient of [name], MD. The CCM team was consulted for assistance with chronic disease management and care coordination needs.    Assessment Review of patient status, including review of consultants reports, relevant laboratory and other test results, and collaboration with appropriate care team members and the patient's provider was performed as part of comprehensive patient evaluation and provision of chronic care management services.    SDOH (Social Determinants of Health) screening performed today: None. See Care Plan for related entries.   Advanced Directives Status: N See Care Plan and Vynca application for related entries.  Lab Results  Component Value Date   HGBA1C 7.3 (A) 05/30/2019   HGBA1C 7.8 (H) 11/27/2018   HGBA1C 7.1 (H) 03/28/2018   Lab Results  Component Value Date   MICROALBUR 1.1 08/25/2017   LDLCALC 64 11/27/2018   CREATININE 0.86 11/27/2018     Outpatient Encounter Medications as of 06/05/2019  Medication Sig Note  . acetaminophen (TYLENOL) 325 MG tablet Take 2 tablets (650 mg total) by mouth every 6 (six) hours.   [name] alendronate (FOSAMAX) 70 MG tablet Take 70 mg by mouth once a week.   [name] aspirin 81 MG tablet Take by mouth. 04/25/2015: Received from: [facility]  . atorvastatin (LIPITOR) 40 MG tablet TAKE 1 TABLET(40 MG) BY MOUTH DAILY 05/08/2019: Night time  . cholecalciferol (VITAMIN D) 1000 UNITS tablet Take by mouth. 04/25/2015: Received from: [facility]  . ibuprofen (ADVIL) 200 MG tablet Take 3 tablets (600 mg total) by mouth every 6 (six) hours.   [name] ketoconazole (NIZORAL) 2 % cream APPLY TO THE SKIN TWICE A DAY TO INFRAMAMMARY AND GROIN AS DIRECTED 08/26/2015: Received from: External Pharmacy  . letrozole (FEMARA) 2.5 MG tablet TAKE 1  TABLET(2.5 MG) BY MOUTH DAILY   . lisinopril-hydrochlorothiazide (ZESTORETIC) 20-12.5 MG tablet TAKE 1 TABLET BY MOUTH DAILY   . naproxen sodium (ALEVE) 220 MG tablet Take 220 mg by mouth.   . sertraline (ZOLOFT) 100 MG tablet TAKE 1 TABLET(100 MG) BY MOUTH DAILY   . sitaGLIPtin (JANUVIA) 100 MG tablet Take 1 tablet (100 mg total) by mouth daily.   [name] SYNTHROID 112 MCG tablet Take 1 tablet (112 mcg total) by mouth daily before breakfast.   . vitamin B-12 (CYANOCOBALAMIN) 1000 MCG tablet Take 1,000 mcg by mouth every morning.    No facility-administered encounter medications on file as of 06/05/2019.      Goals Addressed            This Visit's Progress   . Blood pressure (pt-stated)       Current Barriers:  [name] Knowledge Deficits related to medications for blood pressure  Pharmacist Clinical Goal(s):  [name] Over the next 30 days, patient will demonstrate improved blood pressure control by checking BP at home and working with CCM pharmacist for BP understanding   Interventions: . Comprehensive medication review performed. . Advised patient to try switching lisinopril-HCTZ to the morning to reduce getting up in the night . Check BP at home every night before bed . Updated: patient has been checking BP when she remembers;   Patient Self Care Activities:  . Self administers medications as prescribed . Calls pharmacy for medication refills . Calls provider office for new concerns or questions  Please see past updates  related to this goal by clicking on the "Past Updates" button in the selected goal      . Medication optimization (pt-stated)       Current Barriers:  [name] Knowledge deficit related to purpose and mechanism of medications taken for diabetes  Pharmacist Clinical Goal(s): Over the 30 days, Lindsey [name] will verbalize understanding of medication plan as it relates to his plan of care.   Interventions: . Patient educated on purpose, proper use and potential adverse effects of  Januvia and metformin.   [name] Recommend patient DC meformin and begin Januvia  . Patient needs glucometer as Januvia increases risk of hypoglycemia . Counseled on signs, symptoms, and treatment of hypoglycemia  Patient Self Care Activities:  . Take all medications as prescribed . Contact provider for samples if prescription of Januvia will run out before enrollment in assistance program  Initial goal documentation        Plan: Recommendations for provider:  - glucometer and testing supplies sent in to [facility] #[id number]  Follow up Telephone follow up appointment with care management team member scheduled for: 42 days  [name], PharmD Clinical Pharmacist [facility] [phone]

## 2019-06-06 NOTE — Addendum Note (Signed)
Addended by: Inda Coke on: 06/06/2019 03:46 PM   Modules accepted: Orders

## 2019-06-06 NOTE — Patient Instructions (Signed)
Goals Addressed            This Visit's Progress   . Blood pressure (pt-stated)       Current Barriers:  [name] Knowledge Deficits related to medications for blood pressure  Pharmacist Clinical Goal(s):  [name] Over the next 30 days, patient will demonstrate improved blood pressure control by checking BP at home and working with CCM pharmacist for BP understanding   Interventions: . Comprehensive medication review performed. . Advised patient to try switching lisinopril-HCTZ to the morning to reduce getting up in the night . Check BP at home every night before bed . Updated: patient has been checking BP when she remembers;   Patient Self Care Activities:  . Self administers medications as prescribed . Calls pharmacy for medication refills . Calls provider office for new concerns or questions  Please see past updates related to this goal by clicking on the "Past Updates" button in the selected goal      . Medication optimization (pt-stated)       Current Barriers:  [name] Knowledge deficit related to purpose and mechanism of medications taken for diabetes  Pharmacist Clinical Goal(s): Over the 30 days, Lindsey [name] will verbalize understanding of medication plan as it relates to his plan of care.   Interventions: . Patient educated on purpose, proper use and potential adverse effects of Januvia and metformin.   [name] Recommend patient DC meformin and begin Januvia  . Patient needs glucometer as Januvia increases risk of hypoglycemia . Counseled on signs, symptoms, and treatment of hypoglycemia  Patient Self Care Activities:  . Take all medications as prescribed . Contact provider for samples if prescription of Januvia will run out before enrollment in assistance program  Initial goal documentation

## 2019-06-29 ENCOUNTER — Other Ambulatory Visit: Payer: Self-pay

## 2019-06-29 ENCOUNTER — Encounter: Payer: Self-pay | Admitting: Surgery

## 2019-06-29 ENCOUNTER — Ambulatory Visit (INDEPENDENT_AMBULATORY_CARE_PROVIDER_SITE_OTHER): Payer: PPO | Admitting: Surgery

## 2019-06-29 VITALS — BP 130/80 | HR 67 | Temp 97.7°F | Resp 12 | Ht 61.0 in | Wt 187.0 lb

## 2019-06-29 DIAGNOSIS — C50412 Malignant neoplasm of upper-outer quadrant of left female breast: Secondary | ICD-10-CM | POA: Diagnosis not present

## 2019-06-29 DIAGNOSIS — Z170 Estrogen receptor positive status [ER+]: Secondary | ICD-10-CM | POA: Diagnosis not present

## 2019-06-29 NOTE — Patient Instructions (Signed)
Follow up here next year. We will send you a letter at that time.  Call with any questions or concerns.

## 2019-06-29 NOTE — Progress Notes (Signed)
06/29/2019  History of Present Illness: Lindsey [name] is a 78 y.o. female s/p right mastectomy in 1983 and more recently left mastectomy and SLNBx in 10/2015 with Dr. Jamal Collin.  She presents today for follow up.  Reports doing well, with some occasional burning sensation at the left mastectomy site.  No significant pain.  Denies any changes to the skin, or palpable masses.  Past Medical History: Past Medical History:  Diagnosis Date  . Anxiety   . Arthritis   . Breast cancer ([place]) 1983   right breast cancer  . 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 Bursitis    R knee  . Closed fracture of left ulna and radius   . Complication of anesthesia   . Depression   . Diabetes mellitus type 2, controlled, without complications ([place]) 99991111  . Diabetes mellitus without complication ([place])   . GERD (gastroesophageal reflux disease)    TUMS  . Hyperlipidemia   . Hypertension   . Hypothyroidism   . Osteoporosis   . 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 PONV (postoperative nausea and vomiting)   . Thyroid disease      Past Surgical History: Past Surgical History:  Procedure Laterality Date  . BREAST BIOPSY Left 11-04-15  . BREAST BIOPSY Left 10-10-07  . CATARACT EXTRACTION Bilateral 1990  . CHOLECYSTECTOMY    . MASTECTOMY Right 1983   Dr Myrle Sheng  . MASTECTOMY Right 1983  . MASTECTOMY W/ SENTINEL NODE BIOPSY Left 12/12/2015   Procedure: MASTECTOMY WITH SENTINEL LYMPH NODE BIOPSY;  Surgeon: Christene Lye, MD;  Location: [facility];  Service: General;  Laterality: Left;  . ORIF ULNAR FRACTURE Left 12/11/2018   Procedure: OPEN REDUCTION INTERNAL FIXATION (ORIF) LEFT PROXIMAL ULNA FRACTURE AND  RADIAL HEAD REPLACEMENT;  Surgeon: Milly Jakob, MD;  Location: [place];  Service: Orthopedics;  Laterality: Left;  . RADIAL HEAD ARTHROPLASTY Left 12/11/2018   Procedure: OPEN REDUCTION  INTERNAL FIXATION (ORIF) LEFT PROXIMAL ULNA FRACTURE AND  RADIAL HEAD REPLACEMENT;  Surgeon: Milly Jakob, MD;  Location: [place];  Service: Orthopedics;  Laterality: Left;    Home Medications: Prior to Admission medications   Medication Sig Start Date End Date Taking? Authorizing Provider  acetaminophen (TYLENOL) 325 MG tablet Take 2 tablets (650 mg total) by mouth every 6 (six) hours. 12/11/18  Yes Milly Jakob, MD  alendronate (FOSAMAX) 70 MG tablet Take 70 mg by mouth once a week. 04/23/19  Yes [provider]  aspirin 81 MG tablet Take by mouth.   Yes [provider]  atorvastatin (LIPITOR) 40 MG tablet TAKE 1 TABLET(40 MG) BY MOUTH DAILY 02/23/19  Yes Sowles, Drue Stager, MD  cholecalciferol (VITAMIN D) 1000 UNITS tablet Take by mouth.   Yes [provider]  ibuprofen (ADVIL) 200 MG tablet Take 3 tablets (600 mg total) by mouth every 6 (six) hours. 12/11/18  Yes Milly Jakob, MD  ketoconazole (NIZORAL) 2 % cream APPLY TO THE SKIN TWICE A DAY TO INFRAMAMMARY AND GROIN AS DIRECTED 06/13/15  Yes [provider]  letrozole ([place]) 2.5 MG tablet TAKE 1 TABLET(2.5 MG) BY MOUTH DAILY 01/08/19  Yes Byrnett, Forest Gleason, MD  lisinopril-hydrochlorothiazide (ZESTORETIC) 20-12.5 MG tablet TAKE 1 TABLET BY MOUTH DAILY 05/26/19  Yes Sowles, Drue Stager, MD  naproxen sodium (ALEVE) 220 MG tablet Take 220 mg by mouth.   Yes [provider]  sertraline (ZOLOFT) 100  MG tablet TAKE 1 TABLET(100 MG) BY MOUTH DAILY 05/26/19  Yes Sowles, Drue Stager, MD  sitaGLIPtin (JANUVIA) 100 MG tablet Take 1 tablet (100 mg total) by mouth daily. 05/30/19  Yes [name], MD  SYNTHROID 112 MCG tablet Take 1 tablet (112 mcg total) by mouth daily before breakfast. 05/30/19  Yes Sowles, Drue Stager, MD  vitamin B-12 (CYANOCOBALAMIN) 1000 MCG tablet Take 1,000 mcg by mouth every morning.   Yes [provider]    Allergies: No Known Allergies  Review of  Systems: Review of Systems  Constitutional: Negative for chills and fever.  Respiratory: Negative for shortness of breath.   Cardiovascular: Negative for chest pain.  Gastrointestinal: Negative for nausea and vomiting.  Skin: Negative for rash.    Physical Exam BP 130/80   Pulse 67   Temp 97.7 F (36.5 C) (Temporal)   Resp 12   Ht 5\' 1"  (1.549 m)   Wt 187 lb (84.8 kg)   SpO2 96%   BMI 35.33 kg/m  CONSTITUTIONAL: No acute distress HEENT:  Normocephalic, atraumatic, extraocular motion intact. RESPIRATORY:  Lungs are clear, and breath sounds are equal bilaterally. Normal respiratory effort without pathologic use of accessory muscles. CARDIOVASCULAR: Heart is regular without murmurs, gallops, or rubs. BREAST:  S/p bilateral mastectomies.  Patient has significant keloid scarring on both incisions.  Also has dog-ear excess skin at the corners of the left mastectomy.  No palpable masses, other skin changes.  No axillary or supraclavicular lymphadenopathy on either side. NEUROLOGIC:  Motor and sensation is grossly normal.  Cranial nerves are grossly intact. PSYCH:  Alert and oriented to person, place and time. Affect is normal.  Labs/Imaging: None  Assessment and Plan: This is a 78 y.o. female s/p bilateral mastectomies, more recently left in 10/2015.  --Discussed with patient reassuring exam and that the burning sensation [name] be related to scar tissue and healing.  I do not see any evidence of recurrence, any masses, or other issues except for the keloid scars. --Follow up in 1 year.  No mammogram needed.  Face-to-face time spent with the patient and care providers was 15 minutes, with more than 50% of the time spent counseling, educating, and coordinating care of the patient.     Melvyn Neth, [facility]

## 2019-09-04 ENCOUNTER — Ambulatory Visit: Payer: PPO | Admitting: Family Medicine

## 2019-09-19 ENCOUNTER — Encounter: Payer: Self-pay | Admitting: Family Medicine

## 2019-09-19 ENCOUNTER — Ambulatory Visit (INDEPENDENT_AMBULATORY_CARE_PROVIDER_SITE_OTHER): Payer: PPO | Admitting: Family Medicine

## 2019-09-19 ENCOUNTER — Ambulatory Visit: Payer: PPO | Admitting: Family Medicine

## 2019-09-19 DIAGNOSIS — M810 Age-related osteoporosis without current pathological fracture: Secondary | ICD-10-CM | POA: Diagnosis not present

## 2019-09-19 DIAGNOSIS — E1169 Type 2 diabetes mellitus with other specified complication: Secondary | ICD-10-CM

## 2019-09-19 DIAGNOSIS — E785 Hyperlipidemia, unspecified: Secondary | ICD-10-CM | POA: Diagnosis not present

## 2019-09-19 DIAGNOSIS — I10 Essential (primary) hypertension: Secondary | ICD-10-CM | POA: Diagnosis not present

## 2019-09-19 DIAGNOSIS — F325 Major depressive disorder, single episode, in full remission: Secondary | ICD-10-CM | POA: Diagnosis not present

## 2019-09-19 DIAGNOSIS — E538 Deficiency of other specified B group vitamins: Secondary | ICD-10-CM | POA: Diagnosis not present

## 2019-09-19 DIAGNOSIS — E039 Hypothyroidism, unspecified: Secondary | ICD-10-CM

## 2019-09-19 MED ORDER — ALENDRONATE SODIUM 70 MG PO TABS: 70.0000 mg | ORAL_TABLET | ORAL | 3 refills | Status: DC

## 2019-09-19 MED ORDER — ATORVASTATIN CALCIUM 40 MG PO TABS: 40.0000 mg | ORAL_TABLET | Freq: Every day | ORAL | 1 refills | Status: DC

## 2019-09-19 NOTE — Progress Notes (Signed)
Name: Lindsey [name]   MRN: [id number]    DOB: Sep 19, 1940   Date:09/19/2019       Progress Note  Subjective  Chief Complaint  Chief Complaint  Patient presents with  . Medication Refill  . Diabetes  . Hypothyroidism  . Hypertension    Denies any symptoms  . Hyperlipidemia    I connected with  Blakely L Hankin on 09/19/19 at 11:20 AM EST by telephone and verified that I am speaking with the correct person using two identifiers.  I discussed the limitations, risks, security and privacy concerns of performing an evaluation and management service by telephone and the availability of in person appointments. Staff also discussed with the patient that there Dolezal be a patient responsible charge related to this service. Patient Location: at home  Provider Location: [facility] Additional Individuals present: daughter   HPI  DMII: she used to have microalbuminuria, but has been on ACE and is doing well, last urine micro negative. Also low HDL, on statin therapy. Denies polyphagia, polyuria or polydipsia. She does not check her glucose at home. She is taking aspirin ( discussed new guidelines -but she wants to continue medication). HgbA1C went from 7.1 % to 7.8% last visit it was 7.3%  we changedfrom Januvia 50 mg and Metformin 500 mg bid to Metformin 850 mg twice daily to off set cost back in nov 2019, however Fall 2020 because she was having explosive diarrhea , and changed Januvia to 100 mg. She states diarrhea resolved .  Breast cancer: had on the right side in 1983, treated withmastectomy ,and had recurrence on the left in 2017and had mastectomy of the left , she has been on Femara since, under the care of Dr. Fleet Contras. Unchanged   HTN: she is on  lisinopril hctz 20/12.5she is at home and unable to check bp today, no chest pain or palpitation. She states she gets dizzy when she gets up fast, lasts a few seconds, usually only happens in the mornings. Advised to get a bp  machine   Hypothyroidism: she has been on Synthroid 112 mcg for many years.Last TSH at goal.She has hair loss and dry skin , but chronic. She denies dysphagia. Needs to have TSH rechecked next visit   Osteoporosis:last bone density showed osteoporosis, she was on fosamax ,she was seen by Dr. Gabriel Carina but never got approved for Prolia , she is still on Alendronate once a week  Major Depression: She states that since she has been on higher dose of zoloft she has been doing well , phq 9 improved  GERD: about twice a week she has symptoms, takes tums and symptoms resolved, usually described as heartburn     Patient Active Problem List   Diagnosis Date Noted  . History of breast cancer 06/28/2018  . Bilateral lower extremity edema 01/16/2016  . Breast cancer, left breast ([place]) 11/05/2015  . 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 Morbid (severe) obesity due to excess calories ([place]) 08/26/2015  . Controlled type 2 diabetes mellitus with microalbuminuria ([place]) 04/25/2015  . Hyperlipidemia 04/25/2015  . Benign hypertension 04/25/2015  . Adult hypothyroidism 04/25/2015  . At risk for falling 04/25/2015  . Major depression ([place]) 04/25/2015  . Post menopausal syndrome 04/25/2015    Past Surgical History:  Procedure Laterality Date  . BREAST BIOPSY Left 11-04-15  . BREAST BIOPSY Left 10-10-07  . CATARACT EXTRACTION Bilateral 1990  . CHOLECYSTECTOMY    . MASTECTOMY Right 1983   Dr Myrle Sheng  .  MASTECTOMY Right 1983  . MASTECTOMY W/ SENTINEL NODE BIOPSY Left 12/12/2015   Procedure: MASTECTOMY WITH SENTINEL LYMPH NODE BIOPSY;  Surgeon: Christene Lye, MD;  Location: [facility];  Service: General;  Laterality: Left;  . ORIF ULNAR FRACTURE Left 12/11/2018   Procedure: OPEN REDUCTION INTERNAL FIXATION (ORIF) LEFT PROXIMAL ULNA FRACTURE AND  RADIAL HEAD REPLACEMENT;  Surgeon: Milly Jakob, MD;  Location: [place];  Service: Orthopedics;  Laterality: Left;  .  RADIAL HEAD ARTHROPLASTY Left 12/11/2018   Procedure: OPEN REDUCTION INTERNAL FIXATION (ORIF) LEFT PROXIMAL ULNA FRACTURE AND  RADIAL HEAD REPLACEMENT;  Surgeon: Milly Jakob, MD;  Location: [place];  Service: Orthopedics;  Laterality: Left;    Family History  Problem Relation Age of Onset  . Kidney disease Mother   . Heart disease Father   . Stroke Sister   . Cancer Sister        breast  . Breast cancer Sister   . Stroke Brother   . Diabetes Brother   . Hypertension Brother   . Cancer Daughter 37       DCIS/lumpectomy/genetic negative  . Breast cancer Daughter     Current Outpatient Medications:  .  alendronate (FOSAMAX) 70 MG tablet, Take 70 mg by mouth once a week., Disp: , Rfl:  .  aspirin 81 MG tablet, Take by mouth., Disp: , Rfl:  .  atorvastatin (LIPITOR) 40 MG tablet, TAKE 1 TABLET(40 MG) BY MOUTH DAILY, Disp: 90 tablet, Rfl: 1 .  cholecalciferol (VITAMIN D) 1000 UNITS tablet, Take by mouth., Disp: , Rfl:  .  ibuprofen (ADVIL) 200 MG tablet, Take 3 tablets (600 mg total) by mouth every 6 (six) hours., Disp: , Rfl: 0 .  ketoconazole (NIZORAL) 2 % cream, APPLY TO THE SKIN TWICE A DAY TO INFRAMAMMARY AND GROIN AS DIRECTED, Disp: , Rfl: 0 .  letrozole (FEMARA) 2.5 MG tablet, TAKE 1 TABLET(2.5 MG) BY MOUTH DAILY, Disp: 30 tablet, Rfl: 12 .  lisinopril-hydrochlorothiazide (ZESTORETIC) 20-12.5 MG tablet, TAKE 1 TABLET BY MOUTH DAILY, Disp: 90 tablet, Rfl: 1 .  naproxen sodium (ALEVE) 220 MG tablet, Take 220 mg by mouth., Disp: , Rfl:  .  sertraline (ZOLOFT) 100 MG tablet, TAKE 1 TABLET(100 MG) BY MOUTH DAILY, Disp: 90 tablet, Rfl: 1 .  sitaGLIPtin (JANUVIA) 100 MG tablet, Take 1 tablet (100 mg total) by mouth daily., Disp: 90 tablet, Rfl: 0 .  SYNTHROID 112 MCG tablet, Take 1 tablet (112 mcg total) by mouth daily before breakfast., Disp: 90 tablet, Rfl: 0 .  vitamin B-12 (CYANOCOBALAMIN) 1000 MCG tablet, Take 1,000 mcg by mouth every morning., Disp: , Rfl:  .   acetaminophen (TYLENOL) 325 MG tablet, Take 2 tablets (650 mg total) by mouth every 6 (six) hours. (Patient not taking: Reported on 09/19/2019), Disp: , Rfl:   No Known Allergies  I personally reviewed active problem list, medication list, allergies, family history, social history, health maintenance with the patient/caregiver today.   ROS  Ten systems reviewed and is negative except as mentioned in HPI   Objective  Virtual encounter, vitals not obtained.  There is no height or weight on file to calculate BMI.  Physical Exam  Awake, alert and oriented  PHQ2/9: Depression screen [facility] 2/9 09/19/2019 05/30/2019 06/28/2018 03/28/2018 11/24/2017  Decreased Interest 0 0 2 0 0  Down, Depressed, Hopeless 0 0 2 0 0  PHQ - 2 Score 0 0 4 0 0  Altered sleeping 1 0 2 2 -  Tired, decreased energy 1 0 3 2 -  Change in appetite 0 0 2 1 -  Feeling bad or failure about yourself  0 0 1 1 -  Trouble concentrating 1 0 1 1 -  Moving slowly or fidgety/restless 0 0 0 0 -  Suicidal thoughts 0 0 0 0 -  PHQ-9 Score 3 0 13 7 -  Difficult doing work/chores Somewhat difficult - Somewhat difficult - -   PHQ-2/9 Result is negative.    Fall Risk: Fall Risk  09/19/2019 06/29/2019 06/28/2018 06/27/2018 03/28/2018  Falls in the past year? 0 0 0 0 No  Number falls in past yr: 0 - 0 - -  Injury with Fall? 0 - 0 - -  Risk for fall due to : - - - - -  Risk for fall due to: Comment - - - - -    Assessment & Plan  1. Dyslipidemia associated with type 2 diabetes mellitus (HCC)  - atorvastatin (LIPITOR) 40 MG tablet; Take 1 tablet (40 mg total) by mouth daily.  Dispense: 90 tablet; Refill: 1  2. Osteoporosis, post-menopausal  - alendronate (FOSAMAX) 70 MG tablet; Take 1 tablet (70 mg total) by mouth once a week.  Dispense: 12 tablet; Refill: 3  3. Major depression in remission [facility])  Doing well   4. Benign hypertension  Check bp at home, get slowly to avoid falls  5. Adult hypothyroidism  Recheck labs next  visit   6. Low serum vitamin B12  Continue B12 supplementation   I discussed the assessment and treatment plan with the patient. The patient was provided an opportunity to ask questions and all were answered. The patient agreed with the plan and demonstrated an understanding of the instructions.   The patient was advised to call back or seek an in-person evaluation if the symptoms worsen or if the condition fails to improve as anticipated.  I provided 25  minutes of non-face-to-face time during this encounter.  Loistine Chance, MD

## 2019-09-27 DIAGNOSIS — M545 Low back pain: Secondary | ICD-10-CM | POA: Diagnosis not present

## 2019-09-27 DIAGNOSIS — Z9889 Other specified postprocedural states: Secondary | ICD-10-CM | POA: Diagnosis not present

## 2019-10-09 DIAGNOSIS — M5416 Radiculopathy, lumbar region: Secondary | ICD-10-CM | POA: Diagnosis not present

## 2019-10-09 DIAGNOSIS — M6289 Other specified disorders of muscle: Secondary | ICD-10-CM | POA: Diagnosis not present

## 2019-10-09 DIAGNOSIS — M62830 Muscle spasm of back: Secondary | ICD-10-CM | POA: Diagnosis not present

## 2019-10-17 DIAGNOSIS — M62830 Muscle spasm of back: Secondary | ICD-10-CM | POA: Diagnosis not present

## 2019-10-17 DIAGNOSIS — M5416 Radiculopathy, lumbar region: Secondary | ICD-10-CM | POA: Diagnosis not present

## 2019-10-17 DIAGNOSIS — M6289 Other specified disorders of muscle: Secondary | ICD-10-CM | POA: Diagnosis not present

## 2019-10-22 DIAGNOSIS — M62830 Muscle spasm of back: Secondary | ICD-10-CM | POA: Diagnosis not present

## 2019-10-22 DIAGNOSIS — M5416 Radiculopathy, lumbar region: Secondary | ICD-10-CM | POA: Diagnosis not present

## 2019-10-22 DIAGNOSIS — M6289 Other specified disorders of muscle: Secondary | ICD-10-CM | POA: Diagnosis not present

## 2019-10-23 DIAGNOSIS — M5416 Radiculopathy, lumbar region: Secondary | ICD-10-CM | POA: Diagnosis not present

## 2019-10-24 DIAGNOSIS — M5416 Radiculopathy, lumbar region: Secondary | ICD-10-CM | POA: Diagnosis not present

## 2019-10-24 DIAGNOSIS — M6289 Other specified disorders of muscle: Secondary | ICD-10-CM | POA: Diagnosis not present

## 2019-10-24 DIAGNOSIS — M62830 Muscle spasm of back: Secondary | ICD-10-CM | POA: Diagnosis not present

## 2019-10-29 DIAGNOSIS — M6289 Other specified disorders of muscle: Secondary | ICD-10-CM | POA: Diagnosis not present

## 2019-10-29 DIAGNOSIS — M5416 Radiculopathy, lumbar region: Secondary | ICD-10-CM | POA: Diagnosis not present

## 2019-10-29 DIAGNOSIS — M62830 Muscle spasm of back: Secondary | ICD-10-CM | POA: Diagnosis not present

## 2019-10-31 DIAGNOSIS — M5416 Radiculopathy, lumbar region: Secondary | ICD-10-CM | POA: Diagnosis not present

## 2019-11-01 ENCOUNTER — Other Ambulatory Visit: Payer: Self-pay | Admitting: Family Medicine

## 2019-11-01 DIAGNOSIS — E1129 Type 2 diabetes mellitus with other diabetic kidney complication: Secondary | ICD-10-CM

## 2019-11-01 DIAGNOSIS — R809 Proteinuria, unspecified: Secondary | ICD-10-CM

## 2019-11-01 NOTE — Telephone Encounter (Signed)
Requested Prescriptions  Pending Prescriptions Disp Refills  . JANUVIA 100 MG tablet [Pharmacy Med Name: JANUVIA 100MG  TABLETS] 90 tablet 0    Sig: TAKE 1 TABLET(100 MG) BY MOUTH DAILY     Endocrinology:  Diabetes - DPP-4 Inhibitors Passed - 11/01/2019 11:20 AM      Passed - HBA1C is between 0 and 7.9 and within 180 days    Hemoglobin A1C  Date Value Ref Range Status  05/30/2019 7.3 (A) 4.0 - 5.6 % Final   Hgb A1c MFr Bld  Date Value Ref Range Status  11/27/2018 7.8 (H) <5.7 % of total Hgb Final    Comment:    For someone without known diabetes, a hemoglobin A1c value of 6.5% or greater indicates that they [place] have  diabetes and this should be confirmed with a follow-up  test. . For someone with known diabetes, a value <7% indicates  that their diabetes is well controlled and a value  greater than or equal to 7% indicates suboptimal  control. A1c targets should be individualized based on  duration of diabetes, age, comorbid conditions, and  other considerations. . Currently, no consensus exists regarding use of hemoglobin A1c for diagnosis of diabetes for children. .          Passed - Cr in normal range and within 360 days    Creat  Date Value Ref Range Status  11/27/2018 0.86 0.60 - 0.93 mg/dL Final    Comment:    For patients >28 years of age, the reference limit for Creatinine is approximately 13% higher for people identified as African-American. .    Creatinine, POC  Date Value Ref Range Status  07/01/2016 NEG mg/dL Final   Creatinine, Urine  Date Value Ref Range Status  08/25/2017 97 20 - 275 mg/dL Final         Passed - Valid encounter within last 6 months    Recent Outpatient Visits          1 month ago Dyslipidemia associated with type 2 diabetes mellitus [facility])   [facility] [place], Drue Stager, MD   5 months ago Controlled type 2 diabetes mellitus with microalbuminuria, without long-term current use of insulin [facility])   [facility] [place], Drue Stager, MD   11 months ago Dyslipidemia associated with type 2 diabetes mellitus [facility])   [facility] [name], MD   1 year ago Adult hypothyroidism   [facility] [place], Drue Stager, MD   1 year ago Morbid (severe) obesity due to excess calories [facility])   [facility] [place], [place]      Future Appointments            In 1 month [name], MD [facility], [facility]

## 2019-11-02 DIAGNOSIS — M5416 Radiculopathy, lumbar region: Secondary | ICD-10-CM | POA: Diagnosis not present

## 2019-11-02 DIAGNOSIS — M62830 Muscle spasm of back: Secondary | ICD-10-CM | POA: Diagnosis not present

## 2019-11-02 DIAGNOSIS — M6289 Other specified disorders of muscle: Secondary | ICD-10-CM | POA: Diagnosis not present

## 2019-11-08 DIAGNOSIS — E113393 Type 2 diabetes mellitus with moderate nonproliferative diabetic retinopathy without macular edema, bilateral: Secondary | ICD-10-CM | POA: Diagnosis not present

## 2019-11-19 DIAGNOSIS — M48061 Spinal stenosis, lumbar region without neurogenic claudication: Secondary | ICD-10-CM | POA: Diagnosis not present

## 2019-11-22 ENCOUNTER — Other Ambulatory Visit: Payer: Self-pay | Admitting: Family Medicine

## 2019-11-22 DIAGNOSIS — F331 Major depressive disorder, recurrent, moderate: Secondary | ICD-10-CM

## 2019-11-22 DIAGNOSIS — I10 Essential (primary) hypertension: Secondary | ICD-10-CM

## 2019-11-22 NOTE — Telephone Encounter (Signed)
Requested Prescriptions  Pending Prescriptions Disp Refills  . sertraline (ZOLOFT) 100 MG tablet [Pharmacy Med Name: SERTRALINE 100MG  TABLETS] 90 tablet 1    Sig: TAKE 1 TABLET(100 MG) BY MOUTH DAILY     Psychiatry:  Antidepressants - SSRI Passed - 11/22/2019  7:39 AM      Passed - Completed PHQ-2 or PHQ-9 in the last 360 days.      Passed - Valid encounter within last 6 months    Recent Outpatient Visits          2 months ago Dyslipidemia associated with type 2 diabetes mellitus [facility])   [facility] [place], Drue Stager, MD   5 months ago Controlled type 2 diabetes mellitus with microalbuminuria, without long-term current use of insulin [facility])   [facility] [place], Drue Stager, MD   12 months ago Dyslipidemia associated with type 2 diabetes mellitus [facility])   [facility] [name], MD   1 year ago Adult hypothyroidism   [facility] [name], MD   1 year ago Morbid (severe) obesity due to excess calories [facility])   [facility] [place], [place]      Future Appointments            In 3 weeks [name], MD [facility], [place]           . lisinopril-hydrochlorothiazide (ZESTORETIC) 20-12.5 MG tablet [Pharmacy Med Name: LISINOPRIL-HCTZ 20/12.5MG  TABLETS] 90 tablet 1    Sig: TAKE 1 TABLET BY MOUTH DAILY     Cardiovascular:  ACEI + Diuretic Combos Failed - 11/22/2019  7:39 AM      Failed - Na in normal range and within 180 days    Sodium  Date Value Ref Range Status  11/27/2018 142 135 - 146 mmol/L Final  01/16/2016 150 (H) 134 - 144 mmol/L Final         Failed - K in normal range and within 180 days    Potassium  Date Value Ref Range Status  11/27/2018 4.7 3.5 - 5.3 mmol/L Final         Failed - Cr in normal range and within 180 days    Creat  Date Value Ref Range Status  11/27/2018 0.86 0.60 - 0.93 mg/dL Final    Comment:    For patients >49 years of  age, the reference limit for Creatinine is approximately 13% higher for people identified as African-American. .    Creatinine, POC  Date Value Ref Range Status  07/01/2016 NEG mg/dL Final   Creatinine, Urine  Date Value Ref Range Status  08/25/2017 97 20 - 275 mg/dL Final         Failed - Ca in normal range and within 180 days    Calcium  Date Value Ref Range Status  11/27/2018 9.7 8.6 - 10.4 mg/dL Final         Passed - Patient is not pregnant      Passed - Last BP in normal range    BP Readings from Last 1 Encounters:  06/29/19 130/80         Passed - Valid encounter within last 6 months    Recent Outpatient Visits          2 months ago Dyslipidemia associated with type 2 diabetes mellitus [facility])   [facility] [place], Drue Stager, MD   5 months ago Controlled type 2 diabetes mellitus with microalbuminuria, without long-term current use  of insulin [facility])   [facility] [place], Drue Stager, MD   12 months ago Dyslipidemia associated with type 2 diabetes mellitus [facility])   [facility] [name], MD   1 year ago Adult hypothyroidism   [facility] [name], MD   1 year ago Morbid (severe) obesity due to excess calories [facility])   [place], [place]      Future Appointments            In 3 weeks [name], MD [facility], [facility]

## 2019-11-28 DIAGNOSIS — M48061 Spinal stenosis, lumbar region without neurogenic claudication: Secondary | ICD-10-CM | POA: Diagnosis not present

## 2019-12-04 ENCOUNTER — Other Ambulatory Visit: Payer: Self-pay | Admitting: Family Medicine

## 2019-12-04 ENCOUNTER — Other Ambulatory Visit: Payer: Self-pay

## 2019-12-04 ENCOUNTER — Ambulatory Visit (INDEPENDENT_AMBULATORY_CARE_PROVIDER_SITE_OTHER): Payer: PPO

## 2019-12-04 VITALS — Ht 61.0 in | Wt 187.0 lb

## 2019-12-04 DIAGNOSIS — Z0000 Encounter for general adult medical examination without abnormal findings: Secondary | ICD-10-CM

## 2019-12-04 DIAGNOSIS — E1129 Type 2 diabetes mellitus with other diabetic kidney complication: Secondary | ICD-10-CM

## 2019-12-04 DIAGNOSIS — R809 Proteinuria, unspecified: Secondary | ICD-10-CM

## 2019-12-04 NOTE — Progress Notes (Addendum)
Subjective:   Lindsey [name] is a 79 y.o. female who presents for an Initial Medicare Annual preventive examination.  Virtual Visit via Telephone Note  I connected with Lindsey [name] on 12/04/19 at  2:10 PM EDT by telephone and verified that I am speaking with the correct person using two identifiers.  Medicare Annual Wellness visit completed telephonically due to Covid-19 pandemic.   Location: Patient: home Provider: office   I discussed the limitations, risks, security and privacy concerns of performing an evaluation and management service by telephone and the availability of in person appointments. The patient expressed understanding and agreed to proceed.  Some vital signs Lengel be absent or patient reported.   [name], LPN   Review of Systems:   Cardiac Risk Factors include: advanced age (>45men, >38 women);diabetes mellitus;dyslipidemia;hypertension;sedentary lifestyle;obesity (BMI >30kg/m2)     Objective:     Vitals: Ht 5\' 1"  (1.549 m)   Wt 187 lb (84.8 kg)   BMI 35.33 kg/m   Body mass index is 35.33 kg/m.  Advanced Directives 12/04/2019 12/08/2018 07/29/2017 04/29/2017 10/28/2016 09/30/2016 07/01/2016  Does Patient Have a Medical Advance Directive? No Yes Yes No No No No  Type of Advance Directive - [place];Living will [place];Living will - - - -  Does patient want to make changes to medical advance directive? - No - Patient declined - - - - -  Copy of Lindsey [name] in Chart? - - No - copy requested - - - -  Would patient like information on creating a medical advance directive? No - Patient declined - - - - - No - patient declined information    Tobacco Social History   Tobacco Use  Smoking Status Never Smoker  Smokeless Tobacco Never Used  Tobacco Comment   Non-smoker. Smoking cessation materials contraindicated     Counseling given: Not Answered Comment: Non-smoker. Smoking cessation materials  contraindicated   Clinical Intake:  Pre-visit preparation completed: Yes  Pain : 0-10 Pain Score: 5  Pain Type: Chronic pain Pain Location: Hip Pain Orientation: Right Pain Descriptors / Indicators: Aching, Sore Pain Onset: More than a month ago Pain Frequency: Constant     BMI - recorded: 35.33 Nutritional Status: BMI > 30  Obese Nutritional Risks: None Diabetes: Yes CBG done?: No Did pt. bring in CBG monitor from home?: No  Nutrition Risk Assessment:  Has the patient had any N/V/D within the last 2 months?  No  Does the patient have any non-healing wounds?  No  Has the patient had any unintentional weight loss or weight gain?  No   Diabetes:  Is the patient diabetic?  Yes  If diabetic, was a CBG obtained today?  No  Did the patient bring in their glucometer from home?  No  How often do you monitor your CBG's? Pt does not actively check blood sugar.   Financial Strains and Diabetes Management:  Are you having any financial strains with the device, your supplies or your medication? No .  Does the patient want to be seen by Chronic Care Management for management of their diabetes?  No  Would the patient like to be referred to a Nutritionist or for Diabetic Management?  No   Diabetic Exams:  Diabetic Eye Exam: Completed per patient. Will request records.   Diabetic Foot Exam: Completed 11/24/17. Pt has been advised about the importance in completing this exam. Pt is scheduled for diabetic foot exam on 12/19/19.  How often do you need to have someone help you when you read instructions, pamphlets, or other written materials from your doctor or pharmacy?: 1 - Never  Interpreter Needed?: No  Information entered by :: [name] LPN  Past Medical History:  Diagnosis Date  . Anxiety   . Arthritis   . Breast cancer ([place]) 1983   right breast cancer  . Breast cancer in female [facility]) 11-04-15   Left Breast INVASIVE MAMMARY CARCINOMA  T1c, N0 ER/PR positive Her 2  Negative  . Bursitis    R knee  . Closed fracture of left ulna and radius   . Complication of anesthesia   . Depression   . Diabetes mellitus type 2, controlled, without complications ([facility]) [id number]  . Diabetes mellitus without complication ([place])   . GERD (gastroesophageal reflux disease)    TUMS  . Hyperlipidemia   . Hypertension   . Hypothyroidism   . Osteoporosis   . 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 PONV (postoperative nausea and vomiting)   . Thyroid disease    Past Surgical History:  Procedure Laterality Date  . BREAST BIOPSY Left 11-04-15  . BREAST BIOPSY Left 10-10-07  . CATARACT EXTRACTION Bilateral 1990  . CHOLECYSTECTOMY    . MASTECTOMY Right 1983   Dr Myrle Sheng  . MASTECTOMY Right 1983  . MASTECTOMY W/ SENTINEL NODE BIOPSY Left 12/12/2015   Procedure: MASTECTOMY WITH SENTINEL LYMPH NODE BIOPSY;  Surgeon: Christene Lye, MD;  Location: [facility] ORS;  Service: General;  Laterality: Left;  . ORIF ULNAR FRACTURE Left 12/11/2018   Procedure: OPEN REDUCTION INTERNAL FIXATION (ORIF) LEFT PROXIMAL ULNA FRACTURE AND  RADIAL HEAD REPLACEMENT;  Surgeon: Milly Jakob, MD;  Location: [place];  Service: Orthopedics;  Laterality: Left;  . RADIAL HEAD ARTHROPLASTY Left 12/11/2018   Procedure: OPEN REDUCTION INTERNAL FIXATION (ORIF) LEFT PROXIMAL ULNA FRACTURE AND  RADIAL HEAD REPLACEMENT;  Surgeon: Milly Jakob, MD;  Location: [place];  Service: Orthopedics;  Laterality: Left;   Family History  Problem Relation Age of Onset  . Kidney disease Mother   . Heart disease Father   . Stroke Sister   . Cancer Sister        breast  . Breast cancer Sister   . Stroke Brother   . Diabetes Brother   . Hypertension Brother   . Cancer Daughter 37       DCIS/lumpectomy/genetic negative  . Breast cancer Daughter    Social History   Socioeconomic History  . Marital status: Married    Spouse name: Not on file  . Number of children: 3  .  Years of education: Not on file  . Highest education level: Not on file  Occupational History  . Occupation: Retired  Tobacco Use  . Smoking status: Never Smoker  . Smokeless tobacco: Never Used  . Tobacco comment: Non-smoker. Smoking cessation materials contraindicated  Substance and Sexual Activity  . Alcohol use: No    Alcohol/week: 0.0 standard drinks  . Drug use: No  . Sexual activity: Never  Other Topics Concern  . Not on file  Social History Narrative  . Not on file   Social Determinants of Health   Financial Resource Strain: Low Risk   . Difficulty of Paying Living Expenses: Not hard at all  Food Insecurity: No Food Insecurity  . Worried About Charity fundraiser in the Last Year: Never true  . Ran Out of Food in the Last Year: Never true  Transportation Needs: No Transportation Needs  . Lack of Transportation (Medical): No  . Lack of Transportation (Non-Medical): No  Physical Activity: Inactive  . Days of Exercise per Week: 0 days  . Minutes of Exercise per Session: 0 min  Stress: Stress Concern Present  . Feeling of Stress : Rather much  Social Connections: Somewhat Isolated  . Frequency of Communication with Friends and Family: Three times a week  . Frequency of Social Gatherings with Friends and Family: Never  . Attends Religious Services: Never  . Active Member of Clubs or Organizations: No  . Attends Archivist Meetings: Never  . Marital Status: Married    Outpatient Encounter Medications as of 12/04/2019  Medication Sig  . acetaminophen (TYLENOL) 325 MG tablet Take 2 tablets (650 mg total) by mouth every 6 (six) hours.  [name] alendronate (FOSAMAX) 70 MG tablet Take 1 tablet (70 mg total) by mouth once a week.  [name] aspirin 81 MG tablet Take by mouth.  [name] atorvastatin (LIPITOR) 40 MG tablet Take 1 tablet (40 mg total) by mouth daily.  . cholecalciferol (VITAMIN D) 1000 UNITS tablet Take by mouth.  [name] JANUVIA 100 MG tablet TAKE 1 TABLET(100 MG) BY MOUTH  DAILY  . ketoconazole (NIZORAL) 2 % cream APPLY TO THE SKIN TWICE A DAY TO INFRAMAMMARY AND GROIN AS DIRECTED  . lisinopril-hydrochlorothiazide (ZESTORETIC) 20-12.5 MG tablet TAKE 1 TABLET BY MOUTH DAILY  . sertraline (ZOLOFT) 100 MG tablet TAKE 1 TABLET(100 MG) BY MOUTH DAILY  . SYNTHROID 112 MCG tablet Take 1 tablet (112 mcg total) by mouth daily before breakfast.  . vitamin B-12 (CYANOCOBALAMIN) 1000 MCG tablet Take 1,000 mcg by mouth every morning.  . [DISCONTINUED] ibuprofen (ADVIL) 200 MG tablet Take 3 tablets (600 mg total) by mouth every 6 (six) hours.  . [DISCONTINUED] naproxen sodium (ALEVE) 220 MG tablet Take 220 mg by mouth.   No facility-administered encounter medications on file as of 12/04/2019.    Activities of Daily Living In your present state of health, do you have any difficulty performing the following activities: 12/04/2019 09/19/2019  Hearing? Y Y  Comment declines hearing aids -  Vision? Y Y  Difficulty concentrating or making decisions? Y N  Walking or climbing stairs? Y N  Dressing or bathing? N N  Doing errands, shopping? N Y  Conservation officer, nature and eating ? N -  Using the Toilet? N -  In the past six months, have you accidently leaked urine? Y -  Do you have problems with loss of bowel control? N -  Managing your Medications? N -  Managing your Finances? N -  Housekeeping or managing your Housekeeping? N -  Some recent data might be hidden    Patient Care Team: [name], MD as PCP - General (Family Medicine) Christene Lye, MD as Consulting Physician (General Surgery) Melrose Nakayama, MD as Consulting Physician (Orthopedic Surgery) [name], MD as Attending Physician (Family Medicine) Cathi Roan, [facility] (Pharmacist)    Assessment:   This is a routine wellness examination for Clessie.  Exercise Activities and Dietary recommendations Current Exercise Habits: The patient does not participate in regular exercise at present, Exercise  limited by: orthopedic condition(s)  Goals    . Blood pressure (pt-stated)     Current Barriers:  [name] Knowledge Deficits related to medications for blood pressure  Pharmacist Clinical Goal(s):  [name] Over the next 30 days, patient will demonstrate improved blood pressure control by checking BP at home and working with  [facility] pharmacist for BP understanding   Interventions: . Comprehensive medication review performed. . Advised patient to try switching lisinopril-HCTZ to the morning to reduce getting up in the night . Check BP at home every night before bed . Updated: patient has been checking BP when she remembers;   Patient Self Care Activities:  . Self administers medications as prescribed . Calls pharmacy for medication refills . Calls provider office for new concerns or questions  Please see past updates related to this goal by clicking on the "Past Updates" button in the selected goal      . DIET - INCREASE WATER INTAKE     Recommend to drink 6-8 8oz glasses of water per day    . Medication optimization (pt-stated)     Current Barriers:  [name] Knowledge deficit related to purpose and mechanism of medications taken for diabetes  Pharmacist Clinical Goal(s): Over the 30 days, Ms.. Grismore will verbalize understanding of medication plan as it relates to his plan of care.   Interventions: . Patient educated on purpose, proper use and potential adverse effects of Januvia and metformin.   [name] Recommend patient DC meformin and begin Januvia  . Patient needs glucometer as Januvia increases risk of hypoglycemia . Counseled on signs, symptoms, and treatment of hypoglycemia  Patient Self Care Activities:  . Take all medications as prescribed . Contact provider for samples if prescription of Januvia will run out before enrollment in assistance program  Initial goal documentation        Fall Risk Fall Risk  12/04/2019 09/19/2019 06/29/2019 06/28/2018 06/27/2018  Falls in the past year? 0 0 0 0 0    Number falls in past yr: 0 0 - 0 -  Injury with Fall? 0 0 - 0 -  Risk for fall due to : Impaired vision;Impaired balance/gait - - - -  Risk for fall due to: Comment - - - - -  Follow up Falls prevention discussed - - - -   FALL RISK PREVENTION PERTAINING TO THE HOME:  Any stairs in or around the home? Yes  If so, do they handrails? Yes   Home free of loose throw rugs in walkways, pet beds, electrical cords, etc? Yes  Adequate lighting in your home to reduce risk of falls? Yes   ASSISTIVE DEVICES UTILIZED TO PREVENT FALLS:  Life alert? No  Use of a cane, walker or w/c? No  Grab bars in the bathroom? No  Shower chair or bench in shower? Yes  Elevated toilet seat or a handicapped toilet? Yes   DME ORDERS:  DME order needed?  No   TIMED UP AND GO:  Was the test performed? No .   Education: Fall risk prevention has been discussed.  Intervention(s) required? No   Depression Screen PHQ 2/9 Scores 12/04/2019 09/19/2019 05/30/2019 06/28/2018  PHQ - 2 Score 0 0 0 4  PHQ- 9 Score - 3 0 13     Cognitive Function     6CIT Screen 12/04/2019 07/29/2017  What Year? 0 points 0 points  What month? 0 points 0 points  What time? 0 points 0 points  Count back from 20 0 points 0 points  Months in reverse 0 points 0 points  Repeat phrase 0 points 4 points  Total Score 0 4    Immunization History  Administered Date(s) Administered  . Fluad Quad(high Dose 65+) 05/30/2019  . Influenza, High Dose Seasonal PF 06/12/2014, 06/09/2015, 07/01/2016, 04/29/2017, 06/28/2018  . Influenza,inj,Quad PF,6+ Mos 07/23/2013  .  Pneumococcal Conjugate-13 08/13/2014  . Pneumococcal Polysaccharide-23 07/29/2017    Qualifies for Shingles Vaccine? Yes . Due for Shingrix. Education has been provided regarding the importance of this vaccine. Pt has been advised to call insurance company to determine out of pocket expense. Advised [place] also receive vaccine at local pharmacy or Health Dept. Verbalized acceptance  and understanding.  Tdap: Although this vaccine is not a covered service during a Wellness Exam, does the patient still wish to receive this vaccine today?  No .  Education has been provided regarding the importance of this vaccine. Advised [place] receive this vaccine at local pharmacy or Health Dept. Aware to provide a copy of the vaccination record if obtained from local pharmacy or Health Dept. Verbalized acceptance and understanding.  Flu Vaccine: Up to date  Pneumococcal Vaccine: Up to date   Screening Tests Health Maintenance  Topic Date Due  . OPHTHALMOLOGY EXAM  04/04/2018  . FOOT EXAM  11/25/2018  . HEMOGLOBIN A1C  11/28/2019  . TETANUS/TDAP  05/29/2020 (Originally 12/30/1959)  . INFLUENZA VACCINE  03/23/2020  . DEXA SCAN  Completed  . PNA vac Low Risk Adult  Completed    Cancer Screenings:  Colorectal Screening: Completed 09/12/18. No longer required.   Mammogram: No longer required.   Bone Density: Completed 02/07/18. Results reflect OSTEOPOROSIS. Repeat every 2 years. Ordered by Dr. Honor Junes.  Lung Cancer Screening: (Low Dose CT Chest recommended if Age 17-80 years, 30 pack-year currently smoking OR have quit w/in 15years.) does not qualify.    Additional Screening:  Hepatitis C Screening: no longer required  Vision Screening: Recommended annual ophthalmology exams for early detection of glaucoma and other disorders of the eye. Is the patient up to date with their annual eye exam?  Yes  Who is the provider or what is the name of the office in which the pt attends annual eye exams? Dr. Gloriann Loan  Dental Screening: Recommended annual dental exams for proper oral hygiene  Community Resource Referral:  CRR required this visit?  No      Plan:    I have personally reviewed and addressed the Medicare Annual Wellness questionnaire and have noted the following in the patient's chart:  A. Medical and social history B. Use of alcohol, tobacco or illicit drugs  C. Current  medications and supplements D. Functional ability and status E.  Nutritional status F.  Physical activity G. Advance directives H. List of other physicians I.  Hospitalizations, surgeries, and ER visits in previous 12 months J.  [place] such as hearing and vision if needed, cognitive and depression L. Referrals and appointments   In addition, I have reviewed and discussed with patient certain preventive protocols, quality metrics, and best practice recommendations. A written personalized care plan for preventive services as well as general preventive health recommendations were provided to patient.   Signed,  [name], LPN Nurse Health Advisor   Nurse Notes: pt states she received covid vaccines but did not have card with her to record. Advised to bring to next appt.

## 2019-12-04 NOTE — Patient Instructions (Signed)
Lindsey [name] , Thank you for taking time to come for your Medicare Wellness Visit. I appreciate your ongoing commitment to your health goals. Please review the following plan we discussed and let me know if I can assist you in the future.   Screening recommendations/referrals: Colonoscopy: done 09/12/18 Mammogram: no longer required Bone Density: done 02/07/18 Recommended yearly ophthalmology/optometry visit for glaucoma screening and checkup Recommended yearly dental visit for hygiene and checkup  Vaccinations: Influenza vaccine: done 05/30/19 Pneumococcal vaccine: done 07/29/17 Tdap vaccine: due Shingles vaccine: Shingrix discussed. Please contact your pharmacy for coverage information.    Covid-19: please bring vaccine information to next appt  Advanced directives: Advance directive discussed with you today. I have provided a copy for you to complete at home and have notarized. Once this is complete please bring a copy in to our office so we can scan it into your chart.  Conditions/risks identified: Recommend increasing physical activity as tolerated  Next appointment: Please follow up in one year for your Medicare Annual Wellness visit.     Preventive Care 43 Years and Older, Female Preventive care refers to lifestyle choices and visits with your health care provider that can promote health and wellness. What does preventive care include?  A yearly physical exam. This is also called an annual well check.  Dental exams once or twice a year.  Routine eye exams. Ask your health care provider how often you should have your eyes checked.  Personal lifestyle choices, including:  Daily care of your teeth and gums.  Regular physical activity.  Eating a healthy diet.  Avoiding tobacco and drug use.  Limiting alcohol use.  Practicing safe sex.  Taking low-dose aspirin every day.  Taking vitamin and mineral supplements as recommended by your health care provider. What happens  during an annual well check? The services and screenings done by your health care provider during your annual well check will depend on your age, overall health, lifestyle risk factors, and family history of disease. Counseling  Your health care provider Sunde ask you questions about your:  Alcohol use.  Tobacco use.  Drug use.  Emotional well-being.  Home and relationship well-being.  Sexual activity.  Eating habits.  History of falls.  Memory and ability to understand (cognition).  Work and work Statistician.  Reproductive health. Screening  You Aldaco have the following tests or measurements:  Height, weight, and BMI.  Blood pressure.  Lipid and cholesterol levels. These Paro be checked every 5 years, or more frequently if you are over 29 years old.  Skin check.  Lung cancer screening. You Koppel have this screening every year starting at age 86 if you have a 30-pack-year history of smoking and currently smoke or have quit within the past 15 years.  Fecal occult blood test (FOBT) of the stool. You Butikofer have this test every year starting at age 87.  Flexible sigmoidoscopy or colonoscopy. You Diegel have a sigmoidoscopy every 5 years or a colonoscopy every 10 years starting at age 83.  Hepatitis C blood test.  Hepatitis B blood test.  Sexually transmitted disease (STD) testing.  Diabetes screening. This is done by checking your blood sugar (glucose) after you have not eaten for a while (fasting). You Gottwald have this done every 1-3 years.  Bone density scan. This is done to screen for osteoporosis. You Much have this done starting at age 25.  Mammogram. This Hagg be done every 1-2 years. Talk to your health care provider about how often  you should have regular mammograms. Talk with your health care provider about your test results, treatment options, and if necessary, the need for more tests. Vaccines  Your health care provider Figge recommend certain vaccines, such  as:  Influenza vaccine. This is recommended every year.  Tetanus, diphtheria, and acellular pertussis (Tdap, Td) vaccine. You Soliman need a Td booster every 10 years.  Zoster vaccine. You Lamos need this after age 62.  Pneumococcal 13-valent conjugate (PCV13) vaccine. One dose is recommended after age 4.  Pneumococcal polysaccharide (PPSV23) vaccine. One dose is recommended after age 79. Talk to your health care provider about which screenings and vaccines you need and how often you need them. This information is not intended to replace advice given to you by your health care provider. Make sure you discuss any questions you have with your health care provider. Document Released: 09/05/2015 Document Revised: 04/28/2016 Document Reviewed: 06/10/2015 Elsevier Interactive Patient Education  2017 [place] Prevention in the Home Falls can cause injuries. They can happen to people of all ages. There are many things you can do to make your home safe and to help prevent falls. What can I do on the outside of my home?  Regularly fix the edges of walkways and driveways and fix any cracks.  Remove anything that might make you trip as you walk through a door, such as a raised step or threshold.  Trim any bushes or trees on the path to your home.  Use bright outdoor lighting.  Clear any walking paths of anything that might make someone trip, such as rocks or tools.  Regularly check to see if handrails are loose or broken. Make sure that both sides of any steps have handrails.  Any raised decks and porches should have guardrails on the edges.  Have any leaves, snow, or ice cleared regularly.  Use sand or salt on walking paths during winter.  Clean up any spills in your garage right away. This includes oil or grease spills. What can I do in the bathroom?  Use night lights.  Install grab bars by the toilet and in the tub and shower. Do not use towel bars as grab bars.  Use  non-skid mats or decals in the tub or shower.  If you need to sit down in the shower, use a plastic, non-slip stool.  Keep the floor dry. Clean up any water that spills on the floor as soon as it happens.  Remove soap buildup in the tub or shower regularly.  Attach bath mats securely with double-sided non-slip rug tape.  Do not have throw rugs and other things on the floor that can make you trip. What can I do in the bedroom?  Use night lights.  Make sure that you have a light by your bed that is easy to reach.  Do not use any sheets or blankets that are too big for your bed. They should not hang down onto the floor.  Have a firm chair that has side arms. You can use this for support while you get dressed.  Do not have throw rugs and other things on the floor that can make you trip. What can I do in the kitchen?  Clean up any spills right away.  Avoid walking on wet floors.  Keep items that you use a lot in easy-to-reach places.  If you need to reach something above you, use a strong step stool that has a grab bar.  Keep electrical cords  out of the way.  Do not use floor polish or wax that makes floors slippery. If you must use wax, use non-skid floor wax.  Do not have throw rugs and other things on the floor that can make you trip. What can I do with my stairs?  Do not leave any items on the stairs.  Make sure that there are handrails on both sides of the stairs and use them. Fix handrails that are broken or loose. Make sure that handrails are as long as the stairways.  Check any carpeting to make sure that it is firmly attached to the stairs. Fix any carpet that is loose or worn.  Avoid having throw rugs at the top or bottom of the stairs. If you do have throw rugs, attach them to the floor with carpet tape.  Make sure that you have a light switch at the top of the stairs and the bottom of the stairs. If you do not have them, ask someone to add them for you. What  else can I do to help prevent falls?  Wear shoes that:  Do not have high heels.  Have rubber bottoms.  Are comfortable and fit you well.  Are closed at the toe. Do not wear sandals.  If you use a stepladder:  Make sure that it is fully opened. Do not climb a closed stepladder.  Make sure that both sides of the stepladder are locked into place.  Ask someone to hold it for you, if possible.  Clearly mark and make sure that you can see:  Any grab bars or handrails.  First and last steps.  Where the edge of each step is.  Use tools that help you move around (mobility aids) if they are needed. These include:  Canes.  Walkers.  Scooters.  Crutches.  Turn on the lights when you go into a dark area. Replace any light bulbs as soon as they burn out.  Set up your furniture so you have a clear path. Avoid moving your furniture around.  If any of your floors are uneven, fix them.  If there are any pets around you, be aware of where they are.  Review your medicines with your doctor. Some medicines can make you feel dizzy. This can increase your chance of falling. Ask your doctor what other things that you can do to help prevent falls. This information is not intended to replace advice given to you by your health care provider. Make sure you discuss any questions you have with your health care provider. Document Released: 06/05/2009 Document Revised: 01/15/2016 Document Reviewed: 09/13/2014 Elsevier Interactive Patient Education  2017 [employer].

## 2019-12-18 ENCOUNTER — Ambulatory Visit: Payer: PPO | Admitting: Family Medicine

## 2019-12-18 DIAGNOSIS — H26493 Other secondary cataract, bilateral: Secondary | ICD-10-CM | POA: Diagnosis not present

## 2019-12-18 DIAGNOSIS — H26491 Other secondary cataract, right eye: Secondary | ICD-10-CM | POA: Diagnosis not present

## 2019-12-18 DIAGNOSIS — H35372 Puckering of macula, left eye: Secondary | ICD-10-CM | POA: Diagnosis not present

## 2019-12-18 DIAGNOSIS — H18413 Arcus senilis, bilateral: Secondary | ICD-10-CM | POA: Diagnosis not present

## 2019-12-18 DIAGNOSIS — Z961 Presence of intraocular lens: Secondary | ICD-10-CM | POA: Diagnosis not present

## 2019-12-19 ENCOUNTER — Encounter: Payer: Self-pay | Admitting: Family Medicine

## 2019-12-19 ENCOUNTER — Other Ambulatory Visit: Payer: Self-pay

## 2019-12-19 ENCOUNTER — Ambulatory Visit (INDEPENDENT_AMBULATORY_CARE_PROVIDER_SITE_OTHER): Payer: PPO | Admitting: Family Medicine

## 2019-12-19 VITALS — BP 124/70 | HR 93 | Temp 96.8°F | Resp 16 | Ht 61.0 in | Wt 182.0 lb

## 2019-12-19 DIAGNOSIS — I10 Essential (primary) hypertension: Secondary | ICD-10-CM | POA: Diagnosis not present

## 2019-12-19 DIAGNOSIS — E785 Hyperlipidemia, unspecified: Secondary | ICD-10-CM | POA: Diagnosis not present

## 2019-12-19 DIAGNOSIS — E538 Deficiency of other specified B group vitamins: Secondary | ICD-10-CM | POA: Diagnosis not present

## 2019-12-19 DIAGNOSIS — F325 Major depressive disorder, single episode, in full remission: Secondary | ICD-10-CM | POA: Diagnosis not present

## 2019-12-19 DIAGNOSIS — M5416 Radiculopathy, lumbar region: Secondary | ICD-10-CM

## 2019-12-19 DIAGNOSIS — M170 Bilateral primary osteoarthritis of knee: Secondary | ICD-10-CM

## 2019-12-19 DIAGNOSIS — E1129 Type 2 diabetes mellitus with other diabetic kidney complication: Secondary | ICD-10-CM | POA: Diagnosis not present

## 2019-12-19 DIAGNOSIS — E1169 Type 2 diabetes mellitus with other specified complication: Secondary | ICD-10-CM

## 2019-12-19 DIAGNOSIS — E039 Hypothyroidism, unspecified: Secondary | ICD-10-CM

## 2019-12-19 DIAGNOSIS — R809 Proteinuria, unspecified: Secondary | ICD-10-CM | POA: Diagnosis not present

## 2019-12-19 DIAGNOSIS — H9193 Unspecified hearing loss, bilateral: Secondary | ICD-10-CM | POA: Diagnosis not present

## 2019-12-19 DIAGNOSIS — M810 Age-related osteoporosis without current pathological fracture: Secondary | ICD-10-CM

## 2019-12-19 MED ORDER — LISINOPRIL 40 MG PO TABS: 40.0000 mg | ORAL_TABLET | Freq: Every day | ORAL | 0 refills | Status: DC

## 2019-12-19 MED ORDER — PREGABALIN 50 MG PO CAPS: 50.0000 mg | ORAL_CAPSULE | Freq: Three times a day (TID) | ORAL | 0 refills | Status: DC

## 2019-12-19 MED ORDER — ATORVASTATIN CALCIUM 40 MG PO TABS: 40.0000 mg | ORAL_TABLET | Freq: Every day | ORAL | 1 refills | Status: DC

## 2019-12-19 MED ORDER — SITAGLIPTIN PHOSPHATE 100 MG PO TABS: 100.0000 mg | ORAL_TABLET | Freq: Every day | ORAL | 1 refills | Status: DC

## 2019-12-19 NOTE — Progress Notes (Signed)
Name: Lindsey [name]   MRN: [id number]    DOB: 02/19/1941   Date:12/19/2019       Progress Note  Subjective  Chief Complaint  Chief Complaint  Patient presents with  . Diabetes  . Dyslipidemia  . Hyperlipidemia  . Hypertension  . Hypothyroidism    HPI  DMII: she used to have microalbuminuria, but has been on ACE and is doing well, last urine micro negative. Also low HDL, on statin therapy. Denies polyphagia, polyuria or polydipsia. She does not check her glucose at home. She is taking aspirin ( discussed new guidelines -but she wants to continue medication). HgbA1Cwent from 7.1 % to 7.8% last visit it was 7.3%, she will go to the lab today we changedfrom Januvia 50 mg and Metformin 500 mg bid to Metformin 850 mg twice daily to off set costback in nov 2019, however Fall 2020 because she was having explosive diarrhea , and changed Januvia to 100 mg. Unchanged   Breast cancer: had on the right side in 1983, treated withmastectomy ,and had recurrence on the left in 2017and had mastectomy of the left , she has been on Femara since, under the care of Dr. Fleet Contras. Unchanged   HTN:she is onlisinopril hctz 20/12.5no chest pain or palpitation. She states she gets dizzy when she first stands up in the morning, no problems during the day. Discussed fall prevention. She states she sits at the side of the bed and symptoms resolves within about one minute . We will stop lisinopril hctz and start lisinopril 40 mg recheck bp here in a couple of weeks.   Hypothyroidism: she has been on Synthroid 112 mcg for many years.Last TSH at goal.She has hair loss and dry skin , but chronic. She denies dysphagia. We will recheck labs today   Osteoporosis:last bone density showed osteoporosis, she was on fosamax ,she was seen by Dr. Gabriel Carina, got the approval for Prolia but postponed because of arm fracture and never heard back, advised them to call the office   Major Depression: She states that since  she has been on higher dose of zoloft she has been doing well. Normal Phq 9 today   GERD: she states usually once or twice per week at night, advised to eat earlier and followed a GERD appropriate diet   Right lower back pain: started 2 months ago, causing pain to radiated down right lower leg, she tried PT without help, and had steroid injections done by Dr. Mina Marble, tried tramadol without help  Hearing loss: daughter is worried about her progressive hearing loss - Cindy  Patient Active Problem List   Diagnosis Date Noted  . History of breast cancer 06/28/2018  . Bilateral lower extremity edema 01/16/2016  . Breast cancer, left breast ([place]) 11/05/2015  . 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 Morbid (severe) obesity due to excess calories ([place]) 08/26/2015  . Controlled type 2 diabetes mellitus with microalbuminuria ([place]) 04/25/2015  . Hyperlipidemia 04/25/2015  . Benign hypertension 04/25/2015  . Adult hypothyroidism 04/25/2015  . At risk for falling 04/25/2015  . Major depression ([place]) 04/25/2015  . Post menopausal syndrome 04/25/2015    Past Surgical History:  Procedure Laterality Date  . BREAST BIOPSY Left 11-04-15  . BREAST BIOPSY Left 10-10-07  . CATARACT EXTRACTION Bilateral 1990  . CHOLECYSTECTOMY    . MASTECTOMY Right 1983   Dr Myrle Sheng  . MASTECTOMY Right 1983  . MASTECTOMY W/ SENTINEL NODE BIOPSY Left 12/12/2015   Procedure: MASTECTOMY WITH SENTINEL LYMPH NODE BIOPSY;  Surgeon: Christene Lye, MD;  Location: [facility];  Service: General;  Laterality: Left;  . ORIF ULNAR FRACTURE Left 12/11/2018   Procedure: OPEN REDUCTION INTERNAL FIXATION (ORIF) LEFT PROXIMAL ULNA FRACTURE AND  RADIAL HEAD REPLACEMENT;  Surgeon: Milly Jakob, MD;  Location: [place];  Service: Orthopedics;  Laterality: Left;  . RADIAL HEAD ARTHROPLASTY Left 12/11/2018   Procedure: OPEN REDUCTION INTERNAL FIXATION (ORIF) LEFT PROXIMAL ULNA FRACTURE AND  RADIAL HEAD  REPLACEMENT;  Surgeon: Milly Jakob, MD;  Location: [place];  Service: Orthopedics;  Laterality: Left;    Family History  Problem Relation Age of Onset  . Kidney disease Mother   . Heart disease Father   . Stroke Sister   . Cancer Sister        breast  . Breast cancer Sister   . Stroke Brother   . Diabetes Brother   . Hypertension Brother   . Cancer Daughter 36       DCIS/lumpectomy/genetic negative  . Breast cancer Daughter     Social History   Tobacco Use  . Smoking status: Never Smoker  . Smokeless tobacco: Never Used  . Tobacco comment: Non-smoker. Smoking cessation materials contraindicated  Substance Use Topics  . Alcohol use: No    Alcohol/week: 0.0 standard drinks     Current Outpatient Medications:  .  acetaminophen (TYLENOL) 325 MG tablet, Take 2 tablets (650 mg total) by mouth every 6 (six) hours., Disp: , Rfl:  .  alendronate (FOSAMAX) 70 MG tablet, Take 1 tablet (70 mg total) by mouth once a week., Disp: 12 tablet, Rfl: 3 .  aspirin 81 MG tablet, Take by mouth., Disp: , Rfl:  .  atorvastatin (LIPITOR) 40 MG tablet, Take 1 tablet (40 mg total) by mouth daily., Disp: 90 tablet, Rfl: 1 .  cholecalciferol (VITAMIN D) 1000 UNITS tablet, Take by mouth., Disp: , Rfl:  .  ketoconazole (NIZORAL) 2 % cream, APPLY TO THE SKIN TWICE A DAY TO INFRAMAMMARY AND GROIN AS DIRECTED, Disp: , Rfl: 0 .  sertraline (ZOLOFT) 100 MG tablet, TAKE 1 TABLET(100 MG) BY MOUTH DAILY, Disp: 90 tablet, Rfl: 1 .  sitaGLIPtin (JANUVIA) 100 MG tablet, Take 1 tablet (100 mg total) by mouth daily., Disp: 90 tablet, Rfl: 1 .  SYNTHROID 112 MCG tablet, Take 1 tablet (112 mcg total) by mouth daily before breakfast., Disp: 90 tablet, Rfl: 0 .  vitamin B-12 (CYANOCOBALAMIN) 1000 MCG tablet, Take 1,000 mcg by mouth every morning., Disp: , Rfl:  .  letrozole (FEMARA) 2.5 MG tablet, Take 2.5 mg by mouth daily., Disp: , Rfl:  .  lisinopril (ZESTRIL) 40 MG tablet, Take 1 tablet (40 mg  total) by mouth daily. NEW BP MED, Disp: 90 tablet, Rfl: 0 .  pregabalin (LYRICA) 50 MG capsule, Take 1 capsule (50 mg total) by mouth 3 (three) times daily., Disp: 90 capsule, Rfl: 0  No Known Allergies  I personally reviewed active problem list, medication list, allergies, family history, social history, health maintenance with the patient/caregiver today.   ROS  Constitutional: Negative for fever or weight change.  Respiratory: Negative for cough and shortness of breath.   Cardiovascular: Negative for chest pain or palpitations.  Gastrointestinal: Negative for abdominal pain, no bowel changes.  Musculoskeletal: Negative for gait problem or joint swelling.  Skin: Negative for rash.  Neurological: Negative for dizziness or headache.  No other specific complaints in a complete review of systems (except as listed in HPI above).  Objective  Vitals:   12/19/19 1059  BP: 124/70  Pulse: 93  Resp: 16  Temp: (!) 96.8 F (36 C)  TempSrc: Temporal  SpO2: 99%  Weight: 182 lb (82.6 kg)  Height: 5\' 1"  (1.549 m)    Body mass index is 34.39 kg/m.  Physical Exam  Constitutional: Patient appears well-developed and well-nourished. Obese  No distress.  HEENT: head atraumatic, normocephalic, pupils equal and reactive to light Cardiovascular: Normal rate, regular rhythm and normal heart sounds.  No murmur heard. No BLE edema. Pulmonary/Chest: Effort normal and breath sounds normal. No respiratory distress. Abdominal: Soft.  There is no tenderness. Psychiatric: Patient has a normal mood and affect. behavior is normal. Judgment and thought content normal.  Diabetic Foot Exam: Diabetic Foot Exam - Simple   Simple Foot Form Visual Inspection See comments: Yes Sensation Testing Intact to touch and monofilament testing bilaterally: Yes Pulse Check Posterior Tibialis and Dorsalis pulse intact bilaterally: Yes Comments Thick toenails       PHQ2/9: Depression screen [facility] 2/9 12/19/2019  12/04/2019 09/19/2019 05/30/2019 06/28/2018  Decreased Interest 0 0 0 0 2  Down, Depressed, Hopeless 0 0 0 0 2  PHQ - 2 Score 0 0 0 0 4  Altered sleeping 0 - 1 0 2  Tired, decreased energy 0 - 1 0 3  Change in appetite 0 - 0 0 2  Feeling bad or failure about yourself  0 - 0 0 1  Trouble concentrating 0 - 1 0 1  Moving slowly or fidgety/restless 0 - 0 0 0  Suicidal thoughts 0 - 0 0 0  PHQ-9 Score 0 - 3 0 13  Difficult doing work/chores - - Somewhat difficult - Somewhat difficult  Some recent data might be hidden    phq 9 is negative   Fall Risk: Fall Risk  12/19/2019 12/04/2019 09/19/2019 06/29/2019 06/28/2018  Falls in the past year? 0 0 0 0 0  Number falls in past yr: 0 0 0 - 0  Injury with Fall? 0 0 0 - 0  Risk for fall due to : - Impaired vision;Impaired balance/gait - - -  Risk for fall due to: Comment - - - - -  Follow up - Falls prevention discussed - - -     Functional Status Survey: Is the patient deaf or have difficulty hearing?: No Does the patient have difficulty seeing, even when wearing glasses/contacts?: No Does the patient have difficulty concentrating, remembering, or making decisions?: No Does the patient have difficulty walking or climbing stairs?: No Does the patient have difficulty dressing or bathing?: No Does the patient have difficulty doing errands alone such as visiting a doctor's office or shopping?: No   Assessment & Plan  1. Dyslipidemia associated with type 2 diabetes mellitus (HCC)  - Lipid panel - Hemoglobin A1c - atorvastatin (LIPITOR) 40 MG tablet; Take 1 tablet (40 mg total) by mouth daily.  Dispense: 90 tablet; Refill: 1  2. Morbid obesity ([place])  Discussed with the patient the risk posed by an increased BMI. Discussed importance of portion control, calorie counting and at least 150 minutes of physical activity weekly. Avoid sweet beverages and drink more water. Eat at least 6 servings of fruit and vegetables daily   3. Osteoporosis,  post-menopausal  She will contact Endo for prolia   4. Benign hypertension  bp is towards low end of normal, we will change bp medication  - COMPLETE METABOLIC PANEL WITH GFR - CBC with Differential/Platelet - lisinopril (ZESTRIL) 40 MG tablet;  Take 1 tablet (40 mg total) by mouth daily. NEW BP MED  Dispense: 90 tablet; Refill: 0  5. Major depression in remission [facility])  Doing well on medication  6. Low serum vitamin B12  - Vitamin B12, having some hand paresthesia   7. Primary osteoarthritis of both knees   8. Adult hypothyroidism  - TSH  9. Controlled type 2 diabetes mellitus with microalbuminuria, without long-term current use of insulin (HCC)  - Microalbumin / creatinine urine ratio - Hemoglobin A1c - sitaGLIPtin (JANUVIA) 100 MG tablet; Take 1 tablet (100 mg total) by mouth daily.  Dispense: 90 tablet; Refill: 1  10. Right lumbar radiculopathy  - pregabalin (LYRICA) 50 MG capsule; Take 1 capsule (50 mg total) by mouth 3 (three) times daily.  Dispense: 90 capsule; Refill: 0  11. Bilateral hearing loss, unspecified hearing loss type  - Ambulatory referral to ENT

## 2019-12-20 LAB — CBC WITH DIFFERENTIAL/PLATELET
Absolute Monocytes: 330 cells/uL (ref 200–950)
Basophils Absolute: 39 cells/uL (ref 0–200)
Basophils Relative: 0.7 %
Eosinophils Absolute: 132 cells/uL (ref 15–500)
Eosinophils Relative: 2.4 %
HCT: 41.2 % (ref 35.0–45.0)
Hemoglobin: 13.5 g/dL (ref 11.7–15.5)
Lymphs Abs: 1502 cells/uL (ref 850–3900)
MCH: 28.6 pg (ref 27.0–33.0)
MCHC: 32.8 g/dL (ref 32.0–36.0)
MCV: 87.3 fL (ref 80.0–100.0)
MPV: 12.5 fL (ref 7.5–12.5)
Monocytes Relative: 6 %
Neutro Abs: 3498 cells/uL (ref 1500–7800)
Neutrophils Relative %: 63.6 %
Platelets: 150 10*3/uL (ref 140–400)
RBC: 4.72 10*6/uL (ref 3.80–5.10)
RDW: 13 % (ref 11.0–15.0)
Total Lymphocyte: 27.3 %
WBC: 5.5 10*3/uL (ref 3.8–10.8)

## 2019-12-20 LAB — COMPLETE METABOLIC PANEL WITH GFR
AG Ratio: 2.2 (calc) (ref 1.0–2.5)
ALT: 17 U/L (ref 6–29)
AST: 16 U/L (ref 10–35)
Albumin: 4.7 g/dL (ref 3.6–5.1)
Alkaline phosphatase (APISO): 54 U/L (ref 37–153)
BUN/Creatinine Ratio: 28 (calc) — ABNORMAL HIGH (ref 6–22)
BUN: 28 mg/dL — ABNORMAL HIGH (ref 7–25)
CO2: 32 mmol/L (ref 20–32)
Calcium: 9.5 mg/dL (ref 8.6–10.4)
Chloride: 100 mmol/L (ref 98–110)
Creat: 1.01 mg/dL — ABNORMAL HIGH (ref 0.60–0.93)
GFR, Est African American: 61 mL/min/{1.73_m2} (ref >=60)
GFR, Est Non African American: 53 mL/min/{1.73_m2} — ABNORMAL LOW (ref >=60)
Globulin: 2.1 g/dL (calc) (ref 1.9–3.7)
Glucose, Bld: 319 mg/dL — ABNORMAL HIGH (ref 65–99)
Potassium: 4.1 mmol/L (ref 3.5–5.3)
Sodium: 138 mmol/L (ref 135–146)
Total Bilirubin: 0.8 mg/dL (ref 0.2–1.2)
Total Protein: 6.8 g/dL (ref 6.1–8.1)

## 2019-12-20 LAB — HEMOGLOBIN A1C
Hgb A1c MFr Bld: 11.8 % of total Hgb — ABNORMAL HIGH (ref <5.7)
Mean Plasma Glucose: 292 (calc)
eAG (mmol/L): 16.2 (calc)

## 2019-12-20 LAB — VITAMIN B12: Vitamin B-12: 1302 pg/mL — ABNORMAL HIGH (ref 200–1100)

## 2019-12-20 LAB — LIPID PANEL
Cholesterol: 161 mg/dL (ref <200)
HDL: 53 mg/dL (ref >=50)
LDL Cholesterol (Calc): 86 mg/dL (calc)
Non-HDL Cholesterol (Calc): 108 mg/dL (calc) (ref <130)
Total CHOL/HDL Ratio: 3 (calc) (ref <5.0)
Triglycerides: 122 mg/dL (ref <150)

## 2019-12-20 LAB — TSH: TSH: 0.72 mIU/L (ref 0.40–4.50)

## 2019-12-26 DIAGNOSIS — E538 Deficiency of other specified B group vitamins: Secondary | ICD-10-CM | POA: Diagnosis not present

## 2019-12-26 DIAGNOSIS — E039 Hypothyroidism, unspecified: Secondary | ICD-10-CM | POA: Diagnosis not present

## 2019-12-26 DIAGNOSIS — I10 Essential (primary) hypertension: Secondary | ICD-10-CM | POA: Diagnosis not present

## 2019-12-26 DIAGNOSIS — E785 Hyperlipidemia, unspecified: Secondary | ICD-10-CM | POA: Diagnosis not present

## 2019-12-26 DIAGNOSIS — E1169 Type 2 diabetes mellitus with other specified complication: Secondary | ICD-10-CM | POA: Diagnosis not present

## 2019-12-26 DIAGNOSIS — E1129 Type 2 diabetes mellitus with other diabetic kidney complication: Secondary | ICD-10-CM | POA: Diagnosis not present

## 2019-12-26 DIAGNOSIS — R809 Proteinuria, unspecified: Secondary | ICD-10-CM | POA: Diagnosis not present

## 2019-12-27 LAB — MICROALBUMIN / CREATININE URINE RATIO
Creatinine, Urine: 145 mg/dL (ref 20–275)
Microalb Creat Ratio: 5 mcg/mg creat (ref <30)
Microalb, Ur: 0.7 mg/dL

## 2020-01-09 ENCOUNTER — Telehealth: Payer: Self-pay | Admitting: Family Medicine

## 2020-01-09 NOTE — Chronic Care Management (AMB) (Signed)
  Care Management   Note  01/09/2020 Name: [name] MRN: [id number] DOB: August 30, 1940  Lindsey [name] is a 79 y.o. year old female who is a primary care patient of [name], MD and is actively engaged with the care management team. I reached out to [name] by phone today to assist with scheduling an initial visit with the Pharmacist  Follow up plan: Telephone appointment with care management team member scheduled for:02/05/2020  [place], [place] [facility]  [place], [place] [address] Direct Dial: [email or web address] Website: [email or web address]

## 2020-01-23 DIAGNOSIS — M7061 Trochanteric bursitis, right hip: Secondary | ICD-10-CM | POA: Diagnosis not present

## 2020-01-23 DIAGNOSIS — M47816 Spondylosis without myelopathy or radiculopathy, lumbar region: Secondary | ICD-10-CM | POA: Diagnosis not present

## 2020-02-05 ENCOUNTER — Ambulatory Visit: Payer: PPO | Admitting: Pharmacist

## 2020-02-05 ENCOUNTER — Other Ambulatory Visit: Payer: Self-pay

## 2020-02-05 DIAGNOSIS — R809 Proteinuria, unspecified: Secondary | ICD-10-CM

## 2020-02-05 DIAGNOSIS — M810 Age-related osteoporosis without current pathological fracture: Secondary | ICD-10-CM

## 2020-02-05 NOTE — Chronic Care Management (AMB) (Signed)
Chronic Care Management Pharmacy  Name: Lindsey [name]  MRN: [id number] DOB: [birth year]  Chief Complaint/ HPI  Lindsey [name],  79 y.o. , female presents for their Initial CCM visit with the clinical pharmacist via telephone due to COVID-19 Pandemic.  PCP : [name], MD  Their chronic conditions include: DM, HLD, HTN, hypothyroidism  Office Visits: 4/28 DM, [place], BP 124/70 P 93 Wt 182 BMI 34.4, d/c metformin, A1c rapidly inc, d/c HCTZ orthostasis, f/u osteoporosis  Consult Visit: NA  Medications: Outpatient Encounter Medications as of 02/05/2020  Medication Sig  . acetaminophen (TYLENOL) 325 MG tablet Take 2 tablets (650 mg total) by mouth every 6 (six) hours.  [name] alendronate (FOSAMAX) 70 MG tablet Take 1 tablet (70 mg total) by mouth once a week.  [name] aspirin 81 MG tablet Take by mouth.  [name] atorvastatin (LIPITOR) 40 MG tablet Take 1 tablet (40 mg total) by mouth daily.  . calcium-vitamin D (OSCAL WITH D) 500-200 MG-UNIT tablet Take 1 tablet by mouth daily. 600mg  calcium 800 units Vitamin D  . cholecalciferol (VITAMIN D) 1000 UNITS tablet Take by mouth.  . letrozole (FEMARA) 2.5 MG tablet Take 2.5 mg by mouth daily.  [name] lisinopril (ZESTRIL) 40 MG tablet Take 1 tablet (40 mg total) by mouth daily. NEW BP MED (Patient taking differently: Take 40 mg by mouth at bedtime. )  . sertraline (ZOLOFT) 100 MG tablet TAKE 1 TABLET(100 MG) BY MOUTH DAILY  . sitaGLIPtin (JANUVIA) 100 MG tablet Take 1 tablet (100 mg total) by mouth daily.  [name] SYNTHROID 112 MCG tablet Take 1 tablet (112 mcg total) by mouth daily before breakfast.  . vitamin B-12 (CYANOCOBALAMIN) 1000 MCG tablet Take 1,000 mcg by mouth every morning.  [name] ketoconazole (NIZORAL) 2 % cream APPLY TO THE SKIN TWICE A DAY TO INFRAMAMMARY AND GROIN AS DIRECTED (Patient not taking: Reported on 02/05/2020)  . pregabalin (LYRICA) 50 MG capsule Take 1 capsule (50 mg total) by mouth 3 (three) times daily. (Patient not taking: Reported on  02/05/2020)   No facility-administered encounter medications on file as of 02/05/2020.     Financial Resource Strain: Low Risk   . Difficulty of Paying Living Expenses: Not hard at all    Current Diagnosis/Assessment:  Goals Addressed            This Visit's Progress   . Chronic Care Management       CARE PLAN ENTRY  Current Barriers:  . Chronic Disease Management support, education, and care coordination needs related to Hypertension, Hyperlipidemia, Diabetes, and Osteoporosis   Hypertension BP Readings from Last 3 Encounters:  12/19/19 124/70  06/29/19 130/80  05/30/19 120/80   . Pharmacist Clinical Goal(s): o Over the next 90 days, patient will work with PharmD and providers to maintain BP goal <130/80 . Current regimen:  o Lisinopril 40mg  daily . Interventions: o None . Patient self care activities - Over the next 90 days, patient will: o Check BP weekly, document, and provide at future appointments o Ensure daily salt intake < 2300 mg/day  Hyperlipidemia Lab Results  Component Value Date/Time   LDLCALC 86 12/19/2019 11:34 AM   . Pharmacist Clinical Goal(s): o Over the next 90 days, patient will work with PharmD and providers to maintain LDL goal < 100 (age > 41) . Current regimen:  o Lipitor 40mg  daily . Interventions: o None . Patient self care activities - Over the next 90 days, patient will: o Continue taking atorvastatin 40mg  daily  Diabetes Lab Results  Component Value Date/Time   HGBA1C 11.8 (H) 12/19/2019 11:34 AM   HGBA1C 7.3 (A) 05/30/2019 11:25 AM   HGBA1C 7.8 (H) 11/27/2018 11:21 AM   . Pharmacist Clinical Goal(s): o Over the next 90 days, patient will work with PharmD and providers to achieve A1c goal <7% . Current regimen:  o Januvia 100mg  daily . Interventions: o Stop Januvia o Start Janumet 50/1000mg  1 tab twice daily . Patient self care activities - Over the next 90 days, patient will: o Check blood sugar once daily, document, and  provide at future appointments o Contact provider with any episodes of hypoglycemia  Osteoporosis . Pharmacist Clinical Goal(s) o Over the next 90 days, patient will work with PharmD and providers to promote bone health and reduce bone pain . Current regimen:  o Fosamax 70mg  weekly o Vitamin D3 1000 iu daily o Calcium 600mg /800 iu daily . Interventions: o Stop Aleve o Start acetaminophen arthritis strength 650mg  three times daily . Patient self care activities - Over the next 90 days, patient will: o Take Tylenol three times daily every day o Increase dairy consumption  Medication management . Pharmacist Clinical Goal(s): o Over the next 90 days, patient will work with PharmD and providers to maintain optimal medication adherence . Current pharmacy: [facility] . Interventions o Comprehensive medication review performed. o Continue current medication management strategy . Patient self care activities - Over the next 90 days, patient will: o Focus on medication adherence by taking Janumet with food o Take medications as prescribed o Report any questions or concerns to PharmD and/or provider(s)  Initial goal documentation       Diabetes   Recent Relevant Labs: Lab Results  Component Value Date/Time   HGBA1C 11.8 (H) 12/19/2019 11:34 AM   HGBA1C 7.3 (A) 05/30/2019 11:25 AM   HGBA1C 7.8 (H) 11/27/2018 11:21 AM   MICROALBUR 0.7 12/26/2019 11:33 AM   MICROALBUR 1.1 08/25/2017 12:01 PM   MICROALBUR NEG 07/01/2016 10:10 AM   Kidney Function Lab Results  Component Value Date/Time   CREATININE 1.01 (H) 12/19/2019 11:34 AM   CREATININE 0.86 11/27/2018 11:21 AM   GFRNONAA 53 (L) 12/19/2019 11:34 AM   GFRAA 61 12/19/2019 11:34 AM     Checking BG: Never Patient has failed these meds in past: metformin Patient is currently uncontrolled on the following medications: Januvia  Last diabetic Foot exam: No results found for: HMDIABEYEEXA  Last diabetic Eye exam: No results found  for: HMDIABFOOTEX   We discussed:  Metformin for years, diarrhea for years More steroid injections in recent times  Plan D/c Januvia Start Janumet 50/1000 mg twice daily with meals  Continue current medications  Hyperlipidemia   Lipid Panel     Component Value Date/Time   CHOL 161 12/19/2019 1134   CHOL 151 12/29/2015 0954   TRIG 122 12/19/2019 1134   HDL 53 12/19/2019 1134   HDL 53 12/29/2015 0954   LDLCALC 86 12/19/2019 1134     The ASCVD Risk score (Goff DC Jr., et al., 2013) failed to calculate for the following reasons:   The 2013 ASCVD risk score is only valid for ages 12 to 36   Patient has failed these meds in past: NA Patient is currently uncontrolled on the following medications:  . Lipitor 40mg  daily  We discussed:   No action warranted at age > 46. Optional LDL < 100.  Plan  Continue current medications   Hypertension   Office blood pressures are  BP Readings from Last 3 Encounters:  12/19/19 124/70  06/29/19 130/80  05/30/19 120/80    Patient has failed these meds in the past: NA  Patient checks BP at home infrequently  Patient home BP readings are ranging: NA  We discussed: Well controlled Takes lisinopril at night  Plan  Continue current medications     Osteopenia / Osteoporosis   Last DEXA Scan: 02/07/18   T-Score femoral neck: -2.5  T-Score total hip: -2.4  T-Score lumbar spine: -4.3  10-year probability of major osteoporotic fracture: >20%  10-year probability of hip fracture: >3%  Vit D, 25-Hydroxy  Date Value Ref Range Status  11/24/2017 40 30 - 100 ng/mL Final    Comment:    Vitamin D Status         25-OH Vitamin D: . Deficiency:                    <20 ng/mL Insufficiency:             20 - 29 ng/mL Optimal:                 > or = 30 ng/mL . For 25-OH Vitamin D testing on patients on  D2-supplementation and patients for whom quantitation  of D2 and D3 fractions is required, the QuestAssureD(TM) 25-OH VIT D,  (D2,D3), LC/MS/MS is recommended: order  code [id number] (patients >55yrs). . For more information on this test, go to: http://education.questdiagnostics.com/faq/FAQ163 (This link is being provided for  informational/educational purposes only.)      Patient is a candidate for pharmacologic treatment due to T-Score < -2.5 in femoral neck  Patient has failed these meds in past: NA Patient is currently controlled on the following medications: Fosamax  We discussed:   Spine at T = -4.3 Severe back pain Calcium 600mg  Vit D 800 Not eating yogurt, almond milk, no sugar pregabalin helped but no refills From hip all the way down the leg Walks with stick, needs cane  Plan  Get a cane D/c naproxen Start Tylenol 650mg  three times daily Continue current medications   Medication Management   Pt uses [facility] for all medications Uses pill box? Yes Pt endorses 100% compliance [employer]  We discussed:  Needs name brand Synthroid Improved on Zoloft 100mg , no difference per family Uses Amish cream for arthritis  Plan  Continue current medication management strategy  Follow up: 1 month phone visit  Milus Height, PharmD, [place], [facility] [phone]

## 2020-02-05 NOTE — Patient Instructions (Addendum)
Visit Information  Goals Addressed            This Visit's Progress   . Chronic Care Management       CARE PLAN ENTRY  Current Barriers:  . Chronic Disease Management support, education, and care coordination needs related to Hypertension, Hyperlipidemia, Diabetes, and Osteoporosis   Hypertension BP Readings from Last 3 Encounters:  12/19/19 124/70  06/29/19 130/80  05/30/19 120/80   . Pharmacist Clinical Goal(s): o Over the next 90 days, patient will work with PharmD and providers to maintain BP goal <130/80 . Current regimen:  o Lisinopril 40mg  daily . Interventions: o None . Patient self care activities - Over the next 90 days, patient will: o Check BP weekly, document, and provide at future appointments o Ensure daily salt intake < 2300 mg/day  Hyperlipidemia Lab Results  Component Value Date/Time   LDLCALC 86 12/19/2019 11:34 AM   . Pharmacist Clinical Goal(s): o Over the next 90 days, patient will work with PharmD and providers to maintain LDL goal < 100 (age > 43) . Current regimen:  o Lipitor 40mg  daily . Interventions: o None . Patient self care activities - Over the next 90 days, patient will: o Continue taking atorvastatin 40mg  daily  Diabetes Lab Results  Component Value Date/Time   HGBA1C 11.8 (H) 12/19/2019 11:34 AM   HGBA1C 7.3 (A) 05/30/2019 11:25 AM   HGBA1C 7.8 (H) 11/27/2018 11:21 AM   . Pharmacist Clinical Goal(s): o Over the next 90 days, patient will work with PharmD and providers to achieve A1c goal <7% . Current regimen:  o Januvia 100mg  daily . Interventions: o Stop Januvia o Start Janumet 50/1000mg  1 tab twice daily . Patient self care activities - Over the next 90 days, patient will: o Check blood sugar once daily, document, and provide at future appointments o Contact provider with any episodes of hypoglycemia  Osteoporosis . Pharmacist Clinical Goal(s) o Over the next 90 days, patient will work with PharmD and providers to  promote bone health and reduce bone pain . Current regimen:  o Fosamax 70mg  weekly o Vitamin D3 1000 iu daily o Calcium 600mg /800 iu daily . Interventions: o Stop Aleve o Start acetaminophen arthritis strength 650mg  three times daily . Patient self care activities - Over the next 90 days, patient will: o Take Tylenol three times daily every day o Increase dairy consumption  Medication management . Pharmacist Clinical Goal(s): o Over the next 90 days, patient will work with PharmD and providers to maintain optimal medication adherence . Current pharmacy: [facility] . Interventions o Comprehensive medication review performed. o Continue current medication management strategy . Patient self care activities - Over the next 90 days, patient will: o Focus on medication adherence by taking Janumet with food o Take medications as prescribed o Report any questions or concerns to PharmD and/or provider(s)  Initial goal documentation        Lindsey [name] was given information about Chronic Care Management services today including:  1. CCM service includes personalized support from designated clinical staff supervised by her physician, including individualized plan of care and coordination with other care providers 2. 24/7 contact phone numbers for assistance for urgent and routine care needs. 3. Standard insurance, coinsurance, copays and deductibles apply for chronic care management only during months in which we provide at least 20 minutes of these services. Most insurances cover these services at 100%, however patients [place] be responsible for any copay, coinsurance and/or deductible if applicable. This service  [place] help you avoid the need for more expensive face-to-face services. 4. Only one practitioner Lindsey [name] furnish and bill the service in a calendar month. 5. The patient [place] stop CCM services at any time (effective at the end of the month) by phone call to the office staff.  Patient agreed to  services and verbal consent obtained.   Print copy of patient instructions provided.  Face to Face appointment with pharmacist scheduled for:  1 month  Milus Height, PharmD, [place], [facility] [phone]  Eating Plan for Osteoporosis Osteoporosis causes your bones to become weak and brittle. This puts you at greater risk for bone breaks (fractures) from small bumps or falls. Making changes to your diet and increasing your physical activity can help strengthen your bones and improve your overall health. Calcium and vitamin D are nutrients that play an important role in bone health. Vitamin D helps your body use calcium and strengthen bones. Therefore, it is important to get enough calcium and vitamin D as part of your eating plan for osteoporosis. What are tips for following this plan? Reading food labels  Try to get at least 1,000 milligrams (mg) of calcium each day.  Look for foods that have at least 50 mg of calcium per serving.  Talk with your health care provider about taking a calcium supplement if you do not get enough calcium from food.  Do not have more than 2,500 mg of calcium each day. This is the upper limit for food and nutritional supplements combined. Too much calcium Huang cause constipation and prevent you from absorbing other important nutrients.  Choose foods that contain vitamin D.  Take a daily vitamin supplement that contains 800-1,000 international units (IU) of vitamin D. The amount [place] be different depending on your age, body weight, ethnicity, and where you live. Talk with your dietitian or health care provider about how much vitamin D is right for you.  Avoid foods that have more than 300 mg of sodium per serving. Too much sodium can cause your body to lose calcium.  Talk with your dietitian or health care provider about how much sodium you are allowed each day. Shopping  Do not buy foods with added salt,  including: ? Salted snacks. ? Angie Fava. ? Canned soups. ? Canned meats. ? Processed meats, such as bacon or cold cuts. ? Smoked fish. Meal planning  Eat balanced meals that contain protein foods, fruits and vegetables, and foods rich in calcium and vitamin D.  Eat at least 5 servings of fruits and vegetables each day.  Eat 5-6 oz. of lean meat, poultry, fish, eggs, or beans each day. Lifestyle  Do not use any products that contain nicotine or tobacco, such as cigarettes and e-cigarettes. If you need help quitting, ask your health care provider.  If your health care provider recommends that you lose weight: ? Work with a dietitian to develop an eating plan that will help you reach your desired weight goal. ? Exercise for at least 30 minutes a day, 5 or more days a week, or as told by your health care provider.  Work with a physical therapist to develop an exercise plan that includes flexibility, balance, and strength exercises.  If you drink alcohol, limit how much you have. This means: ? 0-1 drink a day for women. ? 0-2 drinks a day for men. ? Be aware of how much alcohol is in your drink. In the [place] one drink equals one typical bottle of  beer (12 oz), one-half glass of wine (5 oz), or one shot of hard liquor (1 oz). What foods should I eat? Foods high in calcium   Yogurt. Yogurt with fruit.  Milk. Evaporated skim milk. Dry milk powder.  Calcium-fortified orange juice.  Parmesan cheese. Part-skim ricotta cheese. Natural hard cheese. Cream cheese. Cottage cheese.  Canned sardines. Canned salmon.  Calcium-treated tofu. Calcium-fortified cereal bar. Calcium-fortified cereal. Calcium-fortified graham crackers.  Cooked collard greens. Turnip greens. Broccoli. Kale.  Almonds.  White beans.  Corn tortilla. Foods high in vitamin D  Cod liver oil. Fatty fish, such as tuna, mackerel, and salmon.  Milk. Fortified soy milk. Fortified fruit juice.  Yogurt.  Margarine.  Egg yolks. Foods high in protein  Beef. Lamb. Pork tenderloin.  Chicken breast.  Tuna (canned). Fish fillet.  Tofu.  Soy beans (cooked). Soy patty. Beans (canned or cooked).  Cottage cheese.  Yogurt.  Peanut butter.  Pumpkin seeds. Nuts. Sunflower seeds.  Hard cheese.  Milk or other milk products, such as soy milk. The items listed above [place] not be a complete list of foods and beverages you can eat. Contact a dietitian for more options. Summary  Calcium and vitamin D are nutrients that play an important role in bone health and are an important part of your eating plan for osteoporosis.  Eat balanced meals that contain protein foods, fruits and vegetables, and foods rich in calcium and vitamin D.  Avoid foods that have more than 300 mg of sodium per serving. Too much sodium can cause your body to lose calcium.  Exercise is an important part of prevention and treatment of osteoporosis. Aim for at least 30 minutes a day, 5 days a week. This information is not intended to replace advice given to you by your health care provider. Make sure you discuss any questions you have with your health care provider. Document Revised: 10/17/2017 Document Reviewed: 10/17/2017 Elsevier Patient Education  2020 [employer].

## 2020-02-07 DIAGNOSIS — H903 Sensorineural hearing loss, bilateral: Secondary | ICD-10-CM | POA: Diagnosis not present

## 2020-02-11 ENCOUNTER — Other Ambulatory Visit: Payer: Self-pay | Admitting: Emergency Medicine

## 2020-02-11 ENCOUNTER — Telehealth: Payer: Self-pay | Admitting: Surgery

## 2020-02-11 DIAGNOSIS — Z853 Personal history of malignant neoplasm of breast: Secondary | ICD-10-CM

## 2020-02-11 DIAGNOSIS — C50412 Malignant neoplasm of upper-outer quadrant of left female breast: Secondary | ICD-10-CM

## 2020-02-11 DIAGNOSIS — M48061 Spinal stenosis, lumbar region without neurogenic claudication: Secondary | ICD-10-CM | POA: Diagnosis not present

## 2020-02-11 NOTE — Telephone Encounter (Signed)
Per Dr Hampton Abbot he does not refill medication. Recommended referral to Oncology or to Dr Bary Castilla for management of medication.   Called pt. Pt wants referral to Dr Bary Castilla. Referral made and placed at this time. Attached encounter notes, demo sheet and insurance card.   Advised pt Dr [place] office will call her to schedule an appt. Pt voiced understanding and has no further concerns.

## 2020-02-11 NOTE — Telephone Encounter (Signed)
Patients daughter is calling and is asking for a refill on her letrozole she is using [address]. Please call and advise.

## 2020-02-15 ENCOUNTER — Other Ambulatory Visit: Payer: Self-pay | Admitting: Family Medicine

## 2020-02-15 DIAGNOSIS — E039 Hypothyroidism, unspecified: Secondary | ICD-10-CM

## 2020-02-21 DIAGNOSIS — H903 Sensorineural hearing loss, bilateral: Secondary | ICD-10-CM | POA: Diagnosis not present

## 2020-03-06 ENCOUNTER — Ambulatory Visit: Payer: Self-pay

## 2020-03-06 ENCOUNTER — Other Ambulatory Visit: Payer: Self-pay

## 2020-03-06 DIAGNOSIS — M48061 Spinal stenosis, lumbar region without neurogenic claudication: Secondary | ICD-10-CM | POA: Diagnosis not present

## 2020-03-06 NOTE — Chronic Care Management (AMB) (Incomplete)
Chronic Care Management Pharmacy  Name: Lindsey [name]  MRN: [id number] DOB: 28-May-1941  Chief Complaint/ HPI  Lindsey [name],  79 y.o. , female presents for their follow-up CCM visit with the clinical pharmacist via telephone due to COVID-19 Pandemic.  PCP : [name], MD  Their chronic conditions include: ***  Office Visits:***  Consult Visit:***  Medications: Outpatient Encounter Medications as of 03/06/2020  Medication Sig  . acetaminophen (TYLENOL) 325 MG tablet Take 2 tablets (650 mg total) by mouth every 6 (six) hours.  [name] alendronate (FOSAMAX) 70 MG tablet Take 1 tablet (70 mg total) by mouth once a week.  [name] aspirin 81 MG tablet Take by mouth.  [name] atorvastatin (LIPITOR) 40 MG tablet Take 1 tablet (40 mg total) by mouth daily.  . calcium-vitamin D (OSCAL WITH D) 500-200 MG-UNIT tablet Take 1 tablet by mouth daily. 600mg  calcium 800 units Vitamin D  . cholecalciferol (VITAMIN D) 1000 UNITS tablet Take by mouth.  [name] ketoconazole (NIZORAL) 2 % cream APPLY TO THE SKIN TWICE A DAY TO INFRAMAMMARY AND GROIN AS DIRECTED (Patient not taking: Reported on 02/05/2020)  . letrozole (FEMARA) 2.5 MG tablet Take 2.5 mg by mouth daily.  [name] lisinopril (ZESTRIL) 40 MG tablet Take 1 tablet (40 mg total) by mouth daily. NEW BP MED (Patient taking differently: Take 40 mg by mouth at bedtime. )  . pregabalin (LYRICA) 50 MG capsule Take 1 capsule (50 mg total) by mouth 3 (three) times daily. (Patient not taking: Reported on 02/05/2020)  . sertraline (ZOLOFT) 100 MG tablet TAKE 1 TABLET(100 MG) BY MOUTH DAILY  . sitaGLIPtin (JANUVIA) 100 MG tablet Take 1 tablet (100 mg total) by mouth daily.  [name] SYNTHROID 112 MCG tablet TAKE 1 TABLET(112 MCG) BY MOUTH DAILY BEFORE BREAKFAST  . vitamin B-12 (CYANOCOBALAMIN) 1000 MCG tablet Take 1,000 mcg by mouth every morning.   No facility-administered encounter medications on file as of 03/06/2020.     Current Diagnosis/Assessment:  Goals Addressed   None     Diabetes   Recent Relevant Labs: Lab Results  Component Value Date/Time   HGBA1C 11.8 (H) 12/19/2019 11:34 AM   HGBA1C 7.3 (A) 05/30/2019 11:25 AM   HGBA1C 7.8 (H) 11/27/2018 11:21 AM   MICROALBUR 0.7 12/26/2019 11:33 AM   MICROALBUR 1.1 08/25/2017 12:01 PM   MICROALBUR NEG 07/01/2016 10:10 AM     Checking BG: {CHL HP Blood Glucose Monitoring Frequency:[phone]}  Recent FBG Readings: Recent pre-meal BG readings: *** Recent 2hr PP BG readings:  *** Recent HS BG readings: *** Patient has failed these meds in past: *** Patient is currently {CHL Controlled/[phone]} on the following medications: ***  Last diabetic Foot exam: No results found for: HMDIABEYEEXA  Last diabetic Eye exam: No results found for: HMDIABFOOTEX   We discussed:  Janumet?  Plan  Continue {CHL HP [facility] Pharm[facility] Plans:[phone]}   Osteopenia / Osteoporosis   Last DEXA Scan: ***   T-Score femoral neck: ***  T-Score total hip: ***  T-Score lumbar spine: ***  T-Score forearm radius: ***  10-year probability of major osteoporotic fracture: ***  10-year probability of hip fracture: ***  Vit D, 25-Hydroxy  Date Value Ref Range Status  11/24/2017 40 30 - 100 ng/mL Final    Comment:    Vitamin D Status         25-OH Vitamin D: . Deficiency:                    <20  ng/mL Insufficiency:             20 - 29 ng/mL Optimal:                 > or = 30 ng/mL . For 25-OH Vitamin D testing on patients on  D2-supplementation and patients for whom quantitation  of D2 and D3 fractions is required, the QuestAssureD(TM) 25-OH VIT D, (D2,D3), LC/MS/MS is recommended: order  code [phone] (patients >56yrs). . For more information on this test, go to: http://education.questdiagnostics.com/faq/FAQ163 (This link is being provided for  informational/educational purposes only.)      Patient {is;is not an osteoporosis candidate:23886}  Patient has failed these meds in past: *** Patient is  currently {CHL Controlled/Uncontrolled:[phone]} on the following medications:  . ***  We discussed:   Using a cane now? No, will buy Tylenol 3 times daily? No More dairy in diet now? No, maybe frozen yogurt   Plan  Continue {CHL HP [facility] TMLYY:[id number]}   Medication Management   Pt uses *** pharmacy for all medications Uses pill box? {Yes or If no, why not?:20788} Pt endorses ***% compliance  We discussed: ***  Plan  {US Pharmacy EXNT:70017}    Follow up: *** month phone visit  ***

## 2020-03-20 DIAGNOSIS — Z853 Personal history of malignant neoplasm of breast: Secondary | ICD-10-CM | POA: Diagnosis not present

## 2020-03-31 ENCOUNTER — Other Ambulatory Visit: Payer: Self-pay

## 2020-03-31 ENCOUNTER — Encounter: Payer: Self-pay | Admitting: Family Medicine

## 2020-03-31 ENCOUNTER — Other Ambulatory Visit: Payer: Self-pay | Admitting: Family Medicine

## 2020-03-31 ENCOUNTER — Ambulatory Visit (INDEPENDENT_AMBULATORY_CARE_PROVIDER_SITE_OTHER): Payer: PPO | Admitting: Family Medicine

## 2020-03-31 VITALS — BP 110/64 | HR 93 | Temp 98.5°F | Resp 16 | Ht 61.0 in | Wt 181.1 lb

## 2020-03-31 DIAGNOSIS — I10 Essential (primary) hypertension: Secondary | ICD-10-CM | POA: Diagnosis not present

## 2020-03-31 DIAGNOSIS — R809 Proteinuria, unspecified: Secondary | ICD-10-CM | POA: Diagnosis not present

## 2020-03-31 DIAGNOSIS — M810 Age-related osteoporosis without current pathological fracture: Secondary | ICD-10-CM | POA: Diagnosis not present

## 2020-03-31 DIAGNOSIS — R944 Abnormal results of kidney function studies: Secondary | ICD-10-CM | POA: Diagnosis not present

## 2020-03-31 DIAGNOSIS — E785 Hyperlipidemia, unspecified: Secondary | ICD-10-CM

## 2020-03-31 DIAGNOSIS — E538 Deficiency of other specified B group vitamins: Secondary | ICD-10-CM | POA: Diagnosis not present

## 2020-03-31 DIAGNOSIS — E1165 Type 2 diabetes mellitus with hyperglycemia: Secondary | ICD-10-CM | POA: Diagnosis not present

## 2020-03-31 DIAGNOSIS — IMO0002 Reserved for concepts with insufficient information to code with codable children: Secondary | ICD-10-CM

## 2020-03-31 DIAGNOSIS — M5416 Radiculopathy, lumbar region: Secondary | ICD-10-CM | POA: Diagnosis not present

## 2020-03-31 DIAGNOSIS — F331 Major depressive disorder, recurrent, moderate: Secondary | ICD-10-CM | POA: Diagnosis not present

## 2020-03-31 DIAGNOSIS — L659 Nonscarring hair loss, unspecified: Secondary | ICD-10-CM

## 2020-03-31 DIAGNOSIS — E039 Hypothyroidism, unspecified: Secondary | ICD-10-CM | POA: Diagnosis not present

## 2020-03-31 DIAGNOSIS — E1129 Type 2 diabetes mellitus with other diabetic kidney complication: Secondary | ICD-10-CM

## 2020-03-31 DIAGNOSIS — E1169 Type 2 diabetes mellitus with other specified complication: Secondary | ICD-10-CM

## 2020-03-31 LAB — RENAL FUNCTION PANEL
Albumin: 4.4 g/dL (ref 3.6–5.1)
BUN/Creatinine Ratio: 22 (calc) (ref 6–22)
BUN: 20 mg/dL (ref 7–25)
CO2: 29 mmol/L (ref 20–32)
Calcium: 9.4 mg/dL (ref 8.6–10.4)
Chloride: 104 mmol/L (ref 98–110)
Creat: 0.92 mg/dL — ABNORMAL HIGH (ref 0.60–0.88)
Glucose, Bld: 250 mg/dL — ABNORMAL HIGH (ref 65–99)
Phosphorus: 3.9 mg/dL (ref 2.1–4.3)
Potassium: 4.4 mmol/L (ref 3.5–5.3)
Sodium: 141 mmol/L (ref 135–146)

## 2020-03-31 LAB — POCT GLYCOSYLATED HEMOGLOBIN (HGB A1C): Hemoglobin A1C: 11 % — AB (ref 4.0–5.6)

## 2020-03-31 LAB — TSH: TSH: 0.34 mIU/L — ABNORMAL LOW (ref 0.40–4.50)

## 2020-03-31 MED ORDER — INSULIN PEN NEEDLE 29G X 12MM MISC: 1.0000 | Freq: Every day | 0 refills | Status: DC

## 2020-03-31 MED ORDER — BLOOD GLUCOSE METER KIT: PACK | 0 refills | Status: AC

## 2020-03-31 MED ORDER — PREGABALIN 75 MG PO CAPS: 75.0000 mg | ORAL_CAPSULE | Freq: Three times a day (TID) | ORAL | 0 refills | Status: DC

## 2020-03-31 MED ORDER — TRESIBA FLEXTOUCH 100 UNIT/ML ~~LOC~~ SOPN: 10.0000 [IU] | PEN_INJECTOR | Freq: Every day | SUBCUTANEOUS | 0 refills | Status: DC

## 2020-03-31 MED ORDER — LISINOPRIL 40 MG PO TABS: 40.0000 mg | ORAL_TABLET | Freq: Every day | ORAL | 1 refills | Status: DC

## 2020-03-31 MED ORDER — ATORVASTATIN CALCIUM 40 MG PO TABS: 40.0000 mg | ORAL_TABLET | Freq: Every day | ORAL | 1 refills | Status: DC

## 2020-03-31 NOTE — Progress Notes (Signed)
Name: Lindsey [name]   MRN: [id number]    DOB: 02-04-1941   Date:03/31/2020       Progress Note  Subjective  Chief Complaint  Chief Complaint  Patient presents with  . Diabetes  . Hypertension  . Hyperlipidemia  . Hypothyroidism  . Depression    HPI  DMII: she used to have microalbuminuria, but has been on ACE and is doing well, last urine micro negative. Also low HDL, on statin therapy. Denies polyphagia, polyuria or polydipsia. She does not check her glucose at home. She is taking aspirin ( discussed new guidelines -but she wants to continue medication). HgbA1Cwent from 7.1 % to 7.8%to 7.3%and up to 11 % on her last visit, and today also high. She had steroid injections. Advised to stop Januvia and we will try los dose insulin for now . She has been feeling more tired than usual   Breast cancer: had on the right side in 1983, treated withmastectomy ,and had recurrence on the left in 2017and had mastectomy of the left , she has been on Femara since, under the care of Dr. Gaspar Cola recommended to repeat bone density test   HTN:we switched from lisinopril hctz to lisinopril 40 mg on her last visit, not sure if she filled the correct medication, daughter will verify today . She still has some dizziness, no chest pain or palpitation   Hypothyroidism: she has been on Synthroid 112 mcg for many years.Last TSH at goal.She has hair loss that seems to be getting worse, she also has  dry skin, but chronic. She denies dysphagia. We will recheck labs today   Osteoporosis:last bone density showed osteoporosis, she was on fosamax ,she was seen by Dr. Gabriel Carina, got the approval for Prolia but postponed because of arm fracture and pandemic, we will recheck bone density and if still the same we will send her back for another PA for Prolia   Major Depression: She states that since she has been on higher dose of zoloft she has been doing well. Normal Phq 9 today , she states she worries about  her husband, she also gets frustrated because of her back pain and inability to do things she used to be able to do.  GERD: she states usually once or twice per week at night, she is trying to eat a GERD appropriate diet   Right lower back pain: started 2 months ago, causing pain to radiated down right lower leg, she tried PT without help, and had steroid injections done by Dr. Mina Marble, she has lumbar stenosis , Lyrica has improved symptoms, no side effects. She would like to go up on Lyrica 75 mg TID   Hearing loss: she came in with her daughter, she has a hearing aid now, but battery is off   Patient Active Problem List   Diagnosis Date Noted  . History of breast cancer 06/28/2018  . Bilateral lower extremity edema 01/16/2016  . Breast cancer, left breast ([place]) 11/05/2015  . 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 Morbid (severe) obesity due to excess calories ([place]) 08/26/2015  . Controlled type 2 diabetes mellitus with microalbuminuria ([place]) 04/25/2015  . Hyperlipidemia 04/25/2015  . Benign hypertension 04/25/2015  . Adult hypothyroidism 04/25/2015  . At risk for falling 04/25/2015  . Major depression ([place]) 04/25/2015  . Post menopausal syndrome 04/25/2015    Past Surgical History:  Procedure Laterality Date  . BREAST BIOPSY Left 11-04-15  . BREAST BIOPSY Left 10-10-07  . CATARACT EXTRACTION Bilateral 1990  .  CHOLECYSTECTOMY    . MASTECTOMY Right 1983   Dr Myrle Sheng  . MASTECTOMY Right 1983  . MASTECTOMY W/ SENTINEL NODE BIOPSY Left 12/12/2015   Procedure: MASTECTOMY WITH SENTINEL LYMPH NODE BIOPSY;  Surgeon: Christene Lye, MD;  Location: [facility];  Service: General;  Laterality: Left;  . ORIF ULNAR FRACTURE Left 12/11/2018   Procedure: OPEN REDUCTION INTERNAL FIXATION (ORIF) LEFT PROXIMAL ULNA FRACTURE AND  RADIAL HEAD REPLACEMENT;  Surgeon: Milly Jakob, MD;  Location: [place];  Service: Orthopedics;  Laterality: Left;  . RADIAL HEAD  ARTHROPLASTY Left 12/11/2018   Procedure: OPEN REDUCTION INTERNAL FIXATION (ORIF) LEFT PROXIMAL ULNA FRACTURE AND  RADIAL HEAD REPLACEMENT;  Surgeon: Milly Jakob, MD;  Location: [place];  Service: Orthopedics;  Laterality: Left;    Family History  Problem Relation Age of Onset  . Kidney disease Mother   . Heart disease Father   . Stroke Sister   . Cancer Sister        breast  . Breast cancer Sister   . Stroke Brother   . Diabetes Brother   . Hypertension Brother   . Cancer Daughter 18       DCIS/lumpectomy/genetic negative  . Breast cancer Daughter     Social History   Tobacco Use  . Smoking status: Never Smoker  . Smokeless tobacco: Never Used  . Tobacco comment: Non-smoker. Smoking cessation materials contraindicated  Substance Use Topics  . Alcohol use: No    Alcohol/week: 0.0 standard drinks     Current Outpatient Medications:  .  acetaminophen (TYLENOL) 325 MG tablet, Take 2 tablets (650 mg total) by mouth every 6 (six) hours., Disp: , Rfl:  .  alendronate (FOSAMAX) 70 MG tablet, Take 1 tablet (70 mg total) by mouth once a week., Disp: 12 tablet, Rfl: 3 .  aspirin 81 MG tablet, Take by mouth., Disp: , Rfl:  .  atorvastatin (LIPITOR) 40 MG tablet, Take 1 tablet (40 mg total) by mouth daily., Disp: 90 tablet, Rfl: 1 .  calcium-vitamin D (OSCAL WITH D) 500-200 MG-UNIT tablet, Take 1 tablet by mouth daily. 600mg  calcium 800 units Vitamin D, Disp: , Rfl:  .  cholecalciferol (VITAMIN D) 1000 UNITS tablet, Take by mouth., Disp: , Rfl:  .  ketoconazole (NIZORAL) 2 % cream, APPLY TO THE SKIN TWICE A DAY TO INFRAMAMMARY AND GROIN AS DIRECTED, Disp: , Rfl: 0 .  letrozole (FEMARA) 2.5 MG tablet, Take 2.5 mg by mouth daily., Disp: , Rfl:  .  lisinopril (ZESTRIL) 40 MG tablet, Take 1 tablet (40 mg total) by mouth at bedtime., Disp: 90 tablet, Rfl: 1 .  pregabalin (LYRICA) 75 MG capsule, Take 1 capsule (75 mg total) by mouth 3 (three) times daily., Disp: 270  capsule, Rfl: 0 .  sertraline (ZOLOFT) 100 MG tablet, TAKE 1 TABLET(100 MG) BY MOUTH DAILY, Disp: 90 tablet, Rfl: 1 .  SYNTHROID 112 MCG tablet, TAKE 1 TABLET(112 MCG) BY MOUTH DAILY BEFORE BREAKFAST, Disp: 90 tablet, Rfl: 0 .  vitamin B-12 (CYANOCOBALAMIN) 1000 MCG tablet, Take 1,000 mcg by mouth every morning., Disp: , Rfl:  .  insulin degludec (TRESIBA FLEXTOUCH) 100 UNIT/ML FlexTouch Pen, Inject 0.1 mLs (10 Units total) into the skin daily., Disp: 15 mL, Rfl: 0 .  Insulin Pen Needle 29G X 12MM MISC, 1 each by Does not apply route daily., Disp: 1000 each, Rfl: 0  No Known Allergies  I personally reviewed active problem list, medication list, allergies, family history, social  history, health maintenance with the patient/caregiver today.   ROS  Constitutional: Negative for fever or weight change.  Respiratory: Negative for cough and shortness of breath.   Cardiovascular: Negative for chest pain or palpitations.  Gastrointestinal: Negative for abdominal pain, no bowel changes.  Musculoskeletal: Negative for gait problem ( walks slowly)  or joint swelling.  Skin: Negative for rash.  Neurological: Negative for dizziness or headache.  No other specific complaints in a complete review of systems (except as listed in HPI above).  Objective  Vitals:   03/31/20 1142  BP: 110/64  Pulse: 93  Resp: 16  Temp: 98.5 F (36.9 C)  TempSrc: Temporal  SpO2: 99%  Weight: 181 lb 1.6 oz (82.1 kg)  Height: 5\' 1"  (1.549 m)    Body mass index is 34.22 kg/m.  Physical Exam  Constitutional: Patient appears well-developed and well-nourished. Overweight. No distress.  HEENT: head atraumatic, normocephalic, pupils equal and reactive to light, neck supple Cardiovascular: Normal rate, regular rhythm and normal heart sounds.  No murmur heard. No BLE edema. Pulmonary/Chest: Effort normal and breath sounds normal. No respiratory distress. Abdominal: Soft.  There is no tenderness. Psychiatric: Patient  has a normal mood and affect. behavior is normal. Judgment and thought content normal.  Recent Results (from the past 2160 hour(s))  POCT HgB A1C     Status: Abnormal   Collection Time: 03/31/20 11:57 AM  Result Value Ref Range   Hemoglobin A1C 11.0 (A) 4.0 - 5.6 %   HbA1c POC (<> result, manual entry)     HbA1c, POC (prediabetic range)     HbA1c, POC (controlled diabetic range)        PHQ2/9: Depression screen [facility] 2/9 12/19/2019 12/04/2019 09/19/2019 05/30/2019 06/28/2018  Decreased Interest 0 0 0 0 2  Down, Depressed, Hopeless 0 0 0 0 2  PHQ - 2 Score 0 0 0 0 4  Altered sleeping 0 - 1 0 2  Tired, decreased energy 0 - 1 0 3  Change in appetite 0 - 0 0 2  Feeling bad or failure about yourself  0 - 0 0 1  Trouble concentrating 0 - 1 0 1  Moving slowly or fidgety/restless 0 - 0 0 0  Suicidal thoughts 0 - 0 0 0  PHQ-9 Score 0 - 3 0 13  Difficult doing work/chores - - Somewhat difficult - Somewhat difficult  Some recent data might be hidden    phq 9 is negative   Fall Risk: Fall Risk  12/19/2019 12/04/2019 09/19/2019 06/29/2019 06/28/2018  Falls in the past year? 0 0 0 0 0  Number falls in past yr: 0 0 0 - 0  Injury with Fall? 0 0 0 - 0  Risk for fall due to : - Impaired vision;Impaired balance/gait - - -  Risk for fall due to: Comment - - - - -  Follow up - Falls prevention discussed - - -     Assessment & Plan  1. Uncontrolled type 2 diabetes mellitus with microalbuminuria (HCC)  - POCT HgB A1C - insulin degludec (TRESIBA FLEXTOUCH) 100 UNIT/ML FlexTouch Pen; Inject 0.1 mLs (10 Units total) into the skin daily.  Dispense: 15 mL; Refill: 0 - Insulin Pen Needle 29G X 12MM MISC; 1 each by Does not apply route daily.  Dispense: 1000 each; Refill: 0  2. Osteoporosis, post-menopausal  - DG Bone Density; Future  3. Right lumbar radiculopathy  - pregabalin (LYRICA) 75 MG capsule; Take 1 capsule (75 mg total) by mouth  3 (three) times daily.  Dispense: 270 capsule; Refill: 0  4.  Benign hypertension  - lisinopril (ZESTRIL) 40 MG tablet; Take 1 tablet (40 mg total) by mouth at bedtime.  Dispense: 90 tablet; Refill: 1  5. Dyslipidemia associated with type 2 diabetes mellitus (HCC)  - atorvastatin (LIPITOR) 40 MG tablet; Take 1 tablet (40 mg total) by mouth daily.  Dispense: 90 tablet; Refill: 1  6. Adult hypothyroidism  - TSH  7. Low serum vitamin B12  Last level was at goal   8. Decreased GFR  - Renal Function Panel  9. Hair loss  - TSH

## 2020-04-01 ENCOUNTER — Encounter: Payer: Self-pay | Admitting: Family Medicine

## 2020-04-01 ENCOUNTER — Other Ambulatory Visit: Payer: Self-pay | Admitting: Family Medicine

## 2020-04-01 DIAGNOSIS — E039 Hypothyroidism, unspecified: Secondary | ICD-10-CM

## 2020-04-01 MED ORDER — LEVOTHYROXINE SODIUM 112 MCG PO TABS: 112.0000 ug | ORAL_TABLET | Freq: Every day | ORAL | 1 refills | Status: DC

## 2020-04-30 ENCOUNTER — Ambulatory Visit
Admission: RE | Admit: 2020-04-30 | Discharge: 2020-04-30 | Disposition: A | Discharge status: Home / Self Care | Payer: PPO | Source: Ambulatory Visit | Attending: Family Medicine | Admitting: Family Medicine

## 2020-04-30 ENCOUNTER — Other Ambulatory Visit: Payer: Self-pay

## 2020-04-30 DIAGNOSIS — R29890 Loss of height: Secondary | ICD-10-CM | POA: Diagnosis not present

## 2020-04-30 DIAGNOSIS — Z780 Asymptomatic menopausal state: Secondary | ICD-10-CM | POA: Diagnosis not present

## 2020-04-30 DIAGNOSIS — M810 Age-related osteoporosis without current pathological fracture: Secondary | ICD-10-CM | POA: Insufficient documentation

## 2020-05-07 DIAGNOSIS — E113393 Type 2 diabetes mellitus with moderate nonproliferative diabetic retinopathy without macular edema, bilateral: Secondary | ICD-10-CM | POA: Diagnosis not present

## 2020-05-12 ENCOUNTER — Ambulatory Visit: Payer: PPO | Admitting: Family Medicine

## 2020-05-20 ENCOUNTER — Other Ambulatory Visit: Payer: Self-pay | Admitting: Family Medicine

## 2020-05-20 DIAGNOSIS — F331 Major depressive disorder, recurrent, moderate: Secondary | ICD-10-CM

## 2020-05-20 DIAGNOSIS — I10 Essential (primary) hypertension: Secondary | ICD-10-CM

## 2020-05-20 NOTE — Telephone Encounter (Signed)
Requested Prescriptions  Pending Prescriptions Disp Refills  . sertraline (ZOLOFT) 100 MG tablet [Pharmacy Med Name: SERTRALINE 100MG  TABLETS] 90 tablet 1    Sig: TAKE 1 TABLET(100 MG) BY MOUTH DAILY     Psychiatry:  Antidepressants - SSRI Passed - 05/20/2020  6:34 AM      Passed - Completed PHQ-2 or PHQ-9 in the last 360 days.      Passed - Valid encounter within last 6 months    Recent Outpatient Visits          1 month ago Uncontrolled type 2 diabetes mellitus with microalbuminuria [facility])   [facility] [place], Drue Stager, MD   5 months ago Dyslipidemia associated with type 2 diabetes mellitus [facility])   [facility] [place], Drue Stager, MD   8 months ago Dyslipidemia associated with type 2 diabetes mellitus [facility])   [facility] [name], MD   11 months ago Controlled type 2 diabetes mellitus with microalbuminuria, without long-term current use of insulin [facility])   [facility] [place], Drue Stager, MD   1 year ago Dyslipidemia associated with type 2 diabetes mellitus [facility])   [facility] [name], MD      Future Appointments            In 1 month Ancil Boozer, Drue Stager, MD [facility], [place]           . lisinopril-hydrochlorothiazide (ZESTORETIC) 20-12.5 MG tablet [Pharmacy Med Name: LISINOPRIL-HCTZ 20/12.5MG  TABLETS] 90 tablet 1    Sig: TAKE 1 TABLET BY MOUTH DAILY     Cardiovascular:  ACEI + Diuretic Combos Failed - 05/20/2020  6:34 AM      Failed - Cr in normal range and within 180 days    Creat  Date Value Ref Range Status  03/31/2020 0.92 (H) 0.60 - 0.88 mg/dL Final    Comment:    For patients >74 years of age, the reference limit for Creatinine is approximately 13% higher for people identified as African-American. .    Creatinine, POC  Date Value Ref Range Status  07/01/2016 NEG mg/dL Final   Creatinine, Urine  Date Value Ref Range Status  12/26/2019 145  20 - 275 mg/dL Final         Passed - Na in normal range and within 180 days    Sodium  Date Value Ref Range Status  03/31/2020 141 135 - 146 mmol/L Final  01/16/2016 150 (H) 134 - 144 mmol/L Final         Passed - K in normal range and within 180 days    Potassium  Date Value Ref Range Status  03/31/2020 4.4 3.5 - 5.3 mmol/L Final         Passed - Ca in normal range and within 180 days    Calcium  Date Value Ref Range Status  03/31/2020 9.4 8.6 - 10.4 mg/dL Final         Passed - Patient is not pregnant      Passed - Last BP in normal range    BP Readings from Last 1 Encounters:  03/31/20 110/64         Passed - Valid encounter within last 6 months    Recent Outpatient Visits          1 month ago Uncontrolled type 2 diabetes mellitus with microalbuminuria [facility])   [facility] [name], MD   5 months ago Dyslipidemia associated with type 2  diabetes mellitus [facility])   [facility] [place], Drue Stager, MD   8 months ago Dyslipidemia associated with type 2 diabetes mellitus [facility])   [facility] [name], MD   11 months ago Controlled type 2 diabetes mellitus with microalbuminuria, without long-term current use of insulin [facility])   [facility] [place], Drue Stager, MD   1 year ago Dyslipidemia associated with type 2 diabetes mellitus [facility])   [facility] [name], MD      Future Appointments            In 1 month Ancil Boozer, Drue Stager, MD [facility], [facility]

## 2020-06-09 ENCOUNTER — Other Ambulatory Visit: Payer: Self-pay | Admitting: Family Medicine

## 2020-06-09 DIAGNOSIS — E1165 Type 2 diabetes mellitus with hyperglycemia: Secondary | ICD-10-CM

## 2020-06-09 DIAGNOSIS — IMO0002 Reserved for concepts with insufficient information to code with codable children: Secondary | ICD-10-CM

## 2020-06-09 NOTE — Telephone Encounter (Signed)
Requested Prescriptions  Pending Prescriptions Disp Refills  . B-D UF III MINI PEN NEEDLES 31G X 5 MM MISC [Pharmacy Med Name: B-D PEN NDL MINI 31GX5MM(3/16)PRPL] 100 each 3    Sig: USE AS DIRECTED BY DOCTOR, USE TO INJECT INSULIN     Endocrinology: Diabetes - Testing Supplies Passed - 06/09/2020  7:11 PM      Passed - Valid encounter within last 12 months    Recent Outpatient Visits          2 months ago Uncontrolled type 2 diabetes mellitus with microalbuminuria [facility])   [facility], Drue Stager, MD   5 months ago Dyslipidemia associated with type 2 diabetes mellitus [facility])   [facility] [place], Drue Stager, MD   8 months ago Dyslipidemia associated with type 2 diabetes mellitus [facility])   [facility] [place], Drue Stager, MD   1 year ago Controlled type 2 diabetes mellitus with microalbuminuria, without long-term current use of insulin [facility])   [facility] [place], Drue Stager, MD   1 year ago Dyslipidemia associated with type 2 diabetes mellitus [facility])   [facility] [name], MD      Future Appointments            In 2 weeks [name], MD [facility], [facility]

## 2020-06-11 ENCOUNTER — Other Ambulatory Visit: Payer: Self-pay

## 2020-06-11 ENCOUNTER — Ambulatory Visit: Payer: Self-pay | Admitting: Pharmacist

## 2020-06-11 DIAGNOSIS — E7800 Pure hypercholesterolemia, unspecified: Secondary | ICD-10-CM

## 2020-06-11 DIAGNOSIS — E1129 Type 2 diabetes mellitus with other diabetic kidney complication: Secondary | ICD-10-CM

## 2020-06-11 DIAGNOSIS — R809 Proteinuria, unspecified: Secondary | ICD-10-CM

## 2020-06-11 NOTE — Chronic Care Management (AMB) (Signed)
Chronic Care Management Pharmacy  Name: Lindsey [name]  MRN: [id number] DOB: [birth year]  Chief Complaint/ HPI  Lindsey [name],  79 y.o. , female presents for their Follow-Up CCM visit with the clinical pharmacist via telephone due to COVID-19 Pandemic.  PCP : [name], MD  Their chronic conditions include: HTN, HLD, DM  Office Visits:NA  Consult Visit:NA  Medications: Outpatient Encounter Medications as of 06/11/2020  Medication Sig  . acetaminophen (TYLENOL) 325 MG tablet Take 2 tablets (650 mg total) by mouth every 6 (six) hours.  [name] alendronate (FOSAMAX) 70 MG tablet Take 1 tablet (70 mg total) by mouth once a week.  [name] aspirin 81 MG tablet Take by mouth.  [name] atorvastatin (LIPITOR) 40 MG tablet Take 1 tablet (40 mg total) by mouth daily.  . B-D UF III MINI PEN NEEDLES 31G X 5 MM MISC USE AS DIRECTED BY DOCTOR, USE TO INJECT INSULIN  . blood glucose meter kit and supplies Dispense based on patient and insurance preference. Use up to four times daily as directed. (FOR ICD-10 E10.9, E11.9).  . calcium-vitamin D (OSCAL WITH D) 500-200 MG-UNIT tablet Take 1 tablet by mouth daily. 655m calcium 800 units Vitamin D  . cholecalciferol (VITAMIN D) 1000 UNITS tablet Take by mouth.  . insulin degludec (TRESIBA FLEXTOUCH) 100 UNIT/ML FlexTouch Pen Inject 0.1 mLs (10 Units total) into the skin daily.  .Marland Kitchenketoconazole (NIZORAL) 2 % cream APPLY TO THE SKIN TWICE A DAY TO INFRAMAMMARY AND GROIN AS DIRECTED  . letrozole (FEMARA) 2.5 MG tablet Take 2.5 mg by mouth daily.  .Marland Kitchenlevothyroxine (SYNTHROID) 112 MCG tablet Take 1 tablet (112 mcg total) by mouth daily before breakfast. But on Sundays only take half pill and recheck level in 6 weeks  . lisinopril (ZESTRIL) 40 MG tablet Take 1 tablet (40 mg total) by mouth at bedtime.  .Glory RosebushULTRA test strip USE TO TEST UP TO FOUR TIMES A DAY  . pregabalin (LYRICA) 75 MG capsule Take 1 capsule (75 mg total) by mouth 3 (three) times daily.  . sertraline  (ZOLOFT) 100 MG tablet TAKE 1 TABLET(100 MG) BY MOUTH DAILY  . vitamin B-12 (CYANOCOBALAMIN) 1000 MCG tablet Take 1,000 mcg by mouth every morning.   No facility-administered encounter medications on file as of 06/11/2020.     Financial Resource Strain: Low Risk   . Difficulty of Paying Living Expenses: Not hard at all    Current Diagnosis/Assessment:  Goals Addressed            This Visit's Progress   . Chronic Care Management       CARE PLAN ENTRY  Current Barriers:  . Chronic Disease Management support, education, and care coordination needs related to Hypertension, Hyperlipidemia, Diabetes, and Osteoporosis   Hypertension BP Readings from Last 3 Encounters:  12/19/19 124/70  06/29/19 130/80  05/30/19 120/80   . Pharmacist Clinical Goal(s): o Over the next 90 days, patient will work with PharmD and providers to maintain BP goal <130/80 . Current regimen:  o Lisinopril 464mdaily . Interventions: o None . Patient self care activities - Over the next 90 days, patient will: o Check BP weekly, document, and provide at future appointments o Ensure daily salt intake < 2300 mg/day  Hyperlipidemia Lab Results  Component Value Date/Time   LDLCALC 86 12/19/2019 11:34 AM   . Pharmacist Clinical Goal(s): o Over the next 90 days, patient will work with PharmD and providers to maintain LDL goal < 100 (age >  75) . Current regimen:  o Lipitor 17m daily . Interventions: o None . Patient self care activities - Over the next 90 days, patient will: o Continue taking atorvastatin 43mdaily  Diabetes Lab Results  Component Value Date/Time   HGBA1C 11.8 (H) 12/19/2019 11:34 AM   HGBA1C 7.3 (A) 05/30/2019 11:25 AM   HGBA1C 7.8 (H) 11/27/2018 11:21 AM   . Pharmacist Clinical Goal(s): o Over the next 90 days, patient will work with PharmD and providers to achieve A1c goal <7% . Current regimen:  o Tresiba 10 units at bedtime . Interventions: o Recommend probably [place] and Barbudaincrease after next A1c . Patient self care activities - Over the next 90 days, patient will: o Check blood sugar once daily, document, and provide at future appointments o Contact provider with any episodes of hypoglycemia  Osteoporosis . Pharmacist Clinical Goal(s) o Over the next 90 days, patient will work with PharmD and providers to promote bone health and reduce bone pain . Current regimen:  o Fosamax [id number] o Vitamin D3 1000 iu daily o Calcium 600m30m0 iu daily o Tylenol 650mg23mneeded for bone pain . Interventions: o Take Tylenol three times daily every day . Patient self care activities - Over the next 90 days, patient will: o Increase dairy consumption  Medication management . Pharmacist Clinical Goal(s): o Over the next 90 days, patient will work with PharmD and providers to maintain optimal medication adherence . Current pharmacy: [facility] . Interventions o Comprehensive medication review performed. o Continue current medication management strategy . Patient self care activities - Over the next 90 days, patient will: o Focus on medication adherence by taking Janumet with food o Take medications as prescribed o Report any questions or concerns to PharmD and/or provider(s)  Initial goal documentation        Diabetes   Recent Relevant Labs: Lab Results  Component Value Date/Time   HGBA1C 11.0 (A) 03/31/2020 11:57 AM   HGBA1C 11.8 (H) 12/19/2019 11:34 AM   HGBA1C 7.3 (A) 05/30/2019 11:25 AM   HGBA1C 7.8 (H) 11/27/2018 11:21 AM   MICROALBUR 0.7 12/26/2019 11:33 AM   MICROALBUR 1.1 08/25/2017 12:01 PM   MICROALBUR NEG 07/01/2016 10:10 AM     Checking BG: 2x per Day  Recent pre-meal BG readings: 114, rarely 200+ Patient has failed these meds in past: Januvia, metformin Patient is currently uncontrolled on the following medications: Tresiba  Last diabetic Foot exam: No results found for: HMDIABEYEEXA  Last diabetic Eye exam: No results found for:  HMDIABFOOTEX   We discussed:  Tolerating Janumet well? No, stopped D/c Januvia and metformin Started Tresiba 10u Hypoglycemia about monthly or two weeks Last A1c does not reflect Tresiba monotherapy  Plan  Continue current medications  Hyperlipidemia   LDL goal < 100  Lipid Panel     Component Value Date/Time   CHOL 161 12/19/2019 1134   CHOL 151 12/29/2015 0954   TRIG 122 12/19/2019 1134   HDL 53 12/19/2019 1134   HDL 53 12/29/2015 0954   LDLCALC 86 12/19/2019 1134    Hepatic Function Latest Ref Rng & Units 12/19/2019 11/27/2018 03/28/2018  Total Protein 6.1 - 8.1 g/dL 6.8 6.7 6.7  Albumin 3.6 - 5.1 g/dL - - -  AST 10 - 35 U/L _0 ALT 6 - 29 U/L _1 Alk Phosphatase 33 - 130 U/L - - -  Total Bilirubin 0.2 - 1.2 mg/dL 0.8 0.8 0.6     The  ASCVD Risk score [name] DC Jr., et al., 2013) failed to calculate for the following reasons:   The 2013 ASCVD risk score is only valid for ages 54 to 59   Patient has failed these meds in past: NA Patient is currently controlled on the following medications:  . Lipitor 76m daily  We discussed:   At goal Denies myalgias Diet improvements  Plan  Continue current medications   Medication Management   We discussed:  Taking Lyrica  Plan  Continue current medication management strategy  Follow up: 3 month phone visit  TMilus Height PharmD, BH[place] [phone]

## 2020-06-12 NOTE — Patient Instructions (Addendum)
Visit Information  Goals Addressed            This Visit's Progress   . Chronic Care Management       CARE PLAN ENTRY  Current Barriers:  . Chronic Disease Management support, education, and care coordination needs related to Hypertension, Hyperlipidemia, Diabetes, and Osteoporosis   Hypertension BP Readings from Last 3 Encounters:  12/19/19 124/70  06/29/19 130/80  05/30/19 120/80   . Pharmacist Clinical Goal(s): o Over the next 90 days, patient will work with PharmD and providers to maintain BP goal <130/80 . Current regimen:  o Lisinopril 40mg  daily . Interventions: o None . Patient self care activities - Over the next 90 days, patient will: o Check BP weekly, document, and provide at future appointments o Ensure daily salt intake < 2300 mg/day  Hyperlipidemia Lab Results  Component Value Date/Time   LDLCALC 86 12/19/2019 11:34 AM   . Pharmacist Clinical Goal(s): o Over the next 90 days, patient will work with PharmD and providers to maintain LDL goal < 100 (age > 79) . Current regimen:  o Lipitor 40mg  daily . Interventions: o None . Patient self care activities - Over the next 90 days, patient will: o Continue taking atorvastatin 40mg  daily  Diabetes Lab Results  Component Value Date/Time   HGBA1C 11.8 (H) 12/19/2019 11:34 AM   HGBA1C 7.3 (A) 05/30/2019 11:25 AM   HGBA1C 7.8 (H) 11/27/2018 11:21 AM   . Pharmacist Clinical Goal(s): o Over the next 90 days, patient will work with PharmD and providers to achieve A1c goal <7% . Current regimen:  o Tresiba 10 units at bedtime . Interventions: o Recommend probably [place] increase after next A1c . Patient self care activities - Over the next 90 days, patient will: o Check blood sugar once daily, document, and provide at future appointments o Contact provider with any episodes of hypoglycemia  Osteoporosis . Pharmacist Clinical Goal(s) o Over the next 90 days, patient will work with PharmD and providers to  promote bone health and reduce bone pain . Current regimen:  o Fosamax 70mg  weekly o Vitamin D3 1000 iu daily o Calcium 600mg /800 iu daily o Tylenol 650mg  as needed for bone pain . Interventions: o Take Tylenol three times daily every day . Patient self care activities - Over the next 90 days, patient will: o Increase dairy consumption  Medication management . Pharmacist Clinical Goal(s): o Over the next 90 days, patient will work with PharmD and providers to maintain optimal medication adherence . Current pharmacy: [facility] . Interventions o Comprehensive medication review performed. o Continue current medication management strategy . Patient self care activities - Over the next 90 days, patient will: o Focus on medication adherence by taking Janumet with food o Take medications as prescribed o Report any questions or concerns to PharmD and/or provider(s)  Initial goal documentation        Print copy of patient instructions provided.   Telephone follow up appointment with pharmacy team member scheduled for: 3 months  Milus Height, PharmD, [place], [facility] [phone]  Insulin Treatment for Diabetes Mellitus Diabetes (diabetes mellitus) is a long-term (chronic) disease. It occurs when the body does not properly use sugar (glucose) that is released from food after digestion. Glucose levels are controlled by a hormone called insulin. Insulin is made in the pancreas, which is an organ behind the stomach.  If you have type 1 diabetes, you must take insulin because your pancreas does not make any.  If you have type 2 diabetes, you might need to take insulin along with other medicines. In type 2 diabetes, one or both of these problems Perkins be present: ? The pancreas does not make enough insulin. ? Cells in the body do not respond properly to insulin that the body makes (insulin resistance). You must use insulin correctly to control your  diabetes. You must have some insulin in your body at all times. Insulin treatment varies depending on your type of diabetes, your treatment goals, and your medical history. Ask questions to understand your insulin treatment plan so you can be an active partner in managing your diabetes. How is insulin given? Insulin can only be given through a shot (injection). It is injected using a syringe and needle, an insulin pen, a pump, or a jet injector. Your health care provider will:  Prescribe the type and amount of insulin that you need.  Tell you when you should inject your insulin. Where on the body should insulin be injected? Insulin is injected into a layer of fatty tissue under the skin. Good places to inject insulin include:  Abdomen. Generally, the abdomen is the best place to inject insulin. However, you should avoid any area that is less than 2 inches (5 cm) from the belly button (navel).  Front of thigh.  Upper, outer side of thigh.  Upper, outer side of arm.  Upper, outer part of buttock. It is important to:  Give your injection in a slightly different place each time. This helps to prevent irritation and improve absorption.  Avoid injecting into areas that have scar tissue. Usually, you will give yourself insulin injections. Others can also be taught how to give you injections. You will use a special type of syringe that is made only for insulin. Some people Enriques have an insulin pump that delivers insulin steadily through a tube (cannula) that is placed under the skin. What are the different types of insulin? The following information is a general guide to different types of insulin. Specifics vary depending on the insulin product that your health care provider prescribes.  Rapid-acting insulin: ? Starts working quickly, in as little as 5 minutes. ? Can last for 4-6 hours (or sometimes longer). ? Works well when taken right before a meal to quickly lower your blood  glucose.  Short-acting insulin: ? Starts working in about 30 minutes. ? Can last for 6-10 hours. ? Should be taken about 30 minutes before you start eating a meal.  Intermediate-acting insulin: ? Starts working in 1-2 hours. ? Lasts for about 10-18 hours. ? Lowers your blood glucose for a longer period of time, but it is not as effective for lowering blood glucose right after a meal.  Long-acting insulin: ? Mimics the small amount of insulin that your pancreas usually produces throughout the day. ? Should be used one or two times a day. ? Is usually used in combination with other types of insulin or other medicines.  Concentrated insulin, or U-500 insulin: ? Contains a higher dose of insulin than most rapid-acting insulins. U-500 insulin has 5 times the amount of insulin per 1 mL. ? Should only be used with the special U-500 syringe or U-500 insulin pen. It is dangerous to use the wrong type of syringe with this insulin. What are the side effects of insulin? Possible side effects of insulin treatment include:  Low blood glucose (hypoglycemia).  Weight gain.  High blood glucose (hyperglycemia).  Skin injury or irritation. Some of these  side effects can be caused by using improper injection technique. Be sure to learn how to inject insulin properly. What are common terms associated with insulin treatment? Some terms that you might hear include:  Basal insulin, or basal rate. This is the constant amount of insulin that needs to be present in your body to keep your blood glucose levels stable. People who have type 1 diabetes need basal insulin in a steady (continuous) dose 24 hours a day. ? Usually, intermediate-acting or long-acting insulin is used one or two times a day to manage basal insulin levels. ? Medicines that are taken by mouth Stangl also be recommended to manage basal insulin levels.  Prandial or nutrition insulin. This refers to meal-related insulin. ? Blood glucose  rises quickly after a meal (postprandial). Rapid-acting or short-acting insulin can be used right before a meal (preprandial) to quickly lower your blood glucose. ? You Pavlovich be instructed to adjust the amount of prandial insulin that you take, based on how much carbohydrate (starch) is in your meal.  Corrective insulin. This Satchell also be called a correction dose or supplemental dose. This is a small amount of rapid-acting or short-acting insulin that can be used to lower your blood glucose if it is too high. You Basic be instructed to check your blood glucose at certain times of the day and use corrective insulin as needed.  Tight control, or intensive therapy. This means keeping your blood glucose as close to your target as possible, and preventing your blood glucose from getting too high after meals. People who have tight control of their diabetes have fewer long-term problems caused by diabetes. Follow these instructions at home: Talk with your health care provider or pharmacist about the type of insulin you should take and when you should take it. You should know when your insulin goes up the most (peaks) and when it wears off. You need this information so you can plan your meals and exercise. Work with your health care provider to:  Check your blood glucose every day. Your health care provider will tell you how often and when you should do this.  Manage your: ? Weight. ? Blood pressure. ? Cholesterol. ? Stress.  Eat a healthy diet.  Exercise regularly. Summary  Diabetes is a long-term (chronic) disease. It occurs when the body does not properly use sugar (glucose) that is released from food after digestion. Glucose levels are controlled by a hormone called insulin, which is made in an organ behind your stomach (pancreas).  You must use insulin correctly to control your diabetes. You must have some insulin in your body at all times.  Insulin treatment varies depending on your type of  diabetes, your treatment goals, and your medical history.  Talk with your health care provider or pharmacist about the type of insulin you should take and when you should take it.  Check your blood glucose every day. Your health care provider will tell you how often and when you should check it. This information is not intended to replace advice given to you by your health care provider. Make sure you discuss any questions you have with your health care provider. Document Revised: 08/12/2017 Document Reviewed: 09/12/2015 Elsevier Patient Education  2020 [employer].

## 2020-06-20 ENCOUNTER — Ambulatory Visit: Payer: PPO | Admitting: Family Medicine

## 2020-06-23 ENCOUNTER — Other Ambulatory Visit: Payer: Self-pay

## 2020-06-23 ENCOUNTER — Ambulatory Visit (INDEPENDENT_AMBULATORY_CARE_PROVIDER_SITE_OTHER): Payer: PPO | Admitting: Family Medicine

## 2020-06-23 ENCOUNTER — Encounter: Payer: Self-pay | Admitting: Family Medicine

## 2020-06-23 VITALS — BP 112/70 | HR 83 | Temp 98.0°F | Resp 16 | Ht 61.0 in | Wt 185.0 lb

## 2020-06-23 DIAGNOSIS — E039 Hypothyroidism, unspecified: Secondary | ICD-10-CM

## 2020-06-23 DIAGNOSIS — F339 Major depressive disorder, recurrent, unspecified: Secondary | ICD-10-CM | POA: Diagnosis not present

## 2020-06-23 DIAGNOSIS — E1129 Type 2 diabetes mellitus with other diabetic kidney complication: Secondary | ICD-10-CM | POA: Diagnosis not present

## 2020-06-23 DIAGNOSIS — I10 Essential (primary) hypertension: Secondary | ICD-10-CM

## 2020-06-23 DIAGNOSIS — E7800 Pure hypercholesterolemia, unspecified: Secondary | ICD-10-CM

## 2020-06-23 DIAGNOSIS — S40811A Abrasion of right upper arm, initial encounter: Secondary | ICD-10-CM

## 2020-06-23 DIAGNOSIS — Z23 Encounter for immunization: Secondary | ICD-10-CM | POA: Diagnosis not present

## 2020-06-23 DIAGNOSIS — R809 Proteinuria, unspecified: Secondary | ICD-10-CM | POA: Diagnosis not present

## 2020-06-23 DIAGNOSIS — M810 Age-related osteoporosis without current pathological fracture: Secondary | ICD-10-CM

## 2020-06-23 LAB — POCT GLYCOSYLATED HEMOGLOBIN (HGB A1C): Hemoglobin A1C: 6.7 % — AB (ref 4.0–5.6)

## 2020-06-23 MED ORDER — DULOXETINE HCL 30 MG PO CPEP: 30.0000 mg | ORAL_CAPSULE | Freq: Every day | ORAL | 0 refills | Status: DC

## 2020-06-23 NOTE — Patient Instructions (Signed)
Take half dose of sertraline for two weeks , overlap with 30 mg of Duloxetine for those two week

## 2020-06-23 NOTE — Progress Notes (Signed)
Name: Lindsey [name]   MRN: [id number]    DOB: 09/27/1940   Date:06/23/2020       Progress Note  Subjective  Chief Complaint  Follow up   HPI   DMII: she used to have microalbuminuria, but has been on ACE and is doing well, last urine micro negative. Also low HDL, on statin therapy. Denies polyphagia, polyuria or polydipsia. She does not check her glucose at home. She is taking aspirin ( discussed new guidelines -but she wants to continue medication). HgbA1Cwent from 7.1 % to 7.8%to 7.3%and up to 11 % on her last visit, today it was down to 6.7%, she states she had a couple of episodes of low sugar - once in the pm and she had a snack , denies polyphagia, polyuria or polydipsia Glucose has been checked in the afternoons, discussed fasting levels  Breast cancer: had on the right side in 1983, treated withmastectomy ,and had recurrence on the left in 2017and had mastectomy of the left , she has been on Femara since, under the care of Dr. Fleet Contras Unchanged  HTN:we switched from lisinopril hctz to lisinopril 40 mg on her last visit, not sure if she filled the correct medication, daughter will verify today . BP is towards low end of normal but no longer having dizziness, no chest pain or palpitation   Hypothyroidism: she has been on Synthroid 112 mcg and one on Sundays since last TSH was low, we will recheck level today. Denies palpitation or change in bowel movements.   Osteoporosis:last bone density showed osteoporosis, she was on fosamax ,she was seen by Dr. Gabriel Carina, got the approval for Prolia but postponed because of arm fracture and pandemic, repeated bone density 04/30/2020 was worse, reminded her of importance of seeing Dr. Gabriel Carina again   Major Depression: She states that she has been feeling worse not, continues to worry about her husband, she is having increase in back pain and is unable to do things she used to do.We will switch from zoloft to Duloxetine, also discussed gratitude  journal.   Chronic low back pain:  She is under the care of Dr. Mina Marble, she tried PT without help, and had steroid injections done by Dr. Mina Marble, she has lumbar stenosis , Lyrica has improved symptoms, no side effects. 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   Morbid (severe) obesity due to excess calories ([place]) 08/26/2015   Controlled type 2 diabetes mellitus with microalbuminuria ([place]) 04/25/2015   Hyperlipidemia 04/25/2015   Benign hypertension 04/25/2015   Adult hypothyroidism 04/25/2015   At risk for falling 04/25/2015   Major depression ([place]) 04/25/2015   Post menopausal syndrome 04/25/2015    Past Surgical History:  Procedure Laterality Date   BREAST BIOPSY Left 11-04-15   BREAST BIOPSY Left 10-10-07   CATARACT EXTRACTION Bilateral 1990   CHOLECYSTECTOMY     MASTECTOMY Right 1983   Dr Myrle Sheng   MASTECTOMY Right 1983   MASTECTOMY W/ SENTINEL NODE BIOPSY Left 12/12/2015   Procedure: MASTECTOMY WITH SENTINEL LYMPH NODE BIOPSY;  Surgeon: Christene Lye, MD;  Location: [facility];  Service: General;  Laterality: Left;   ORIF ULNAR FRACTURE Left 12/11/2018   Procedure:  OPEN REDUCTION INTERNAL FIXATION (ORIF) LEFT PROXIMAL ULNA FRACTURE AND  RADIAL HEAD REPLACEMENT;  Surgeon: Milly Jakob, MD;  Location: [facility];  Service: Orthopedics;  Laterality: Left;   RADIAL HEAD ARTHROPLASTY Left 12/11/2018   Procedure: OPEN REDUCTION INTERNAL FIXATION (ORIF) LEFT PROXIMAL ULNA FRACTURE AND  RADIAL HEAD REPLACEMENT;  Surgeon: Milly Jakob, MD;  Location: [facility];  Service: Orthopedics;  Laterality: Left;    Family History  Problem  Relation Age of Onset   Kidney disease Mother    Heart disease Father    Stroke Sister    Cancer Sister        breast   Breast cancer Sister    Stroke Brother    Diabetes Brother    Hypertension Brother    Cancer Daughter 4       DCIS/lumpectomy/genetic negative   Breast cancer Daughter     Social History   Tobacco Use   Smoking status: Never Smoker   Smokeless tobacco: Never Used   Tobacco comment: Non-smoker. Smoking cessation materials contraindicated  Substance Use Topics   Alcohol use: No    Alcohol/week: 0.0 standard drinks     Current Outpatient Medications:    acetaminophen (TYLENOL) 325 MG tablet, Take 2 tablets (650 mg total) by mouth every 6 (six) hours., Disp: , Rfl:    alendronate (FOSAMAX) 70 MG tablet, Take 1 tablet (70 mg total) by mouth once a week., Disp: 12 tablet, Rfl: 3   aspirin 81 MG tablet, Take by mouth., Disp: , Rfl:    atorvastatin (LIPITOR) 40 MG tablet, Take 1 tablet (40 mg total) by mouth daily., Disp: 90 tablet, Rfl: 1   B-D UF III MINI PEN NEEDLES 31G X 5 MM MISC, USE AS DIRECTED BY DOCTOR, USE TO INJECT INSULIN, Disp: 100 each, Rfl: 3   blood glucose meter kit and supplies, Dispense based on patient and insurance preference. Use up to four times daily as directed. (FOR ICD-10 E10.9, E11.9)., Disp: 1 each, Rfl: 0   calcium-vitamin D (OSCAL WITH D) 500-200 MG-UNIT tablet, Take 1 tablet by mouth daily. $RemoveBefo'600mg'PqCDrgkMeye$  calcium 800 units Vitamin D, Disp: , Rfl:    cholecalciferol (VITAMIN D) 1000 UNITS tablet, Take by mouth., Disp: , Rfl:    insulin degludec (TRESIBA FLEXTOUCH) 100 UNIT/ML FlexTouch Pen, Inject 0.1 mLs (10 Units total) into the skin daily., Disp: 15 mL, Rfl: 0   ketoconazole (NIZORAL) 2 % cream, APPLY TO THE SKIN TWICE A DAY TO INFRAMAMMARY AND GROIN AS DIRECTED, Disp: , Rfl: 0   Lancets (ONETOUCH DELICA PLUS [id number]) MISC, USE TO TEST UP TO FOUR TIMES A DAY AS DIRECTED, Disp: , Rfl:    letrozole (FEMARA) 2.5 MG  tablet, Take 2.5 mg by mouth daily., Disp: , Rfl:    levothyroxine (SYNTHROID) 112 MCG tablet, Take 1 tablet (112 mcg total) by mouth daily before breakfast. But on Sundays only take half pill and recheck level in 6 weeks, Disp: 30 tablet, Rfl: 1   lisinopril (ZESTRIL) 40 MG tablet, Take 1 tablet (40 mg total) by mouth at bedtime., Disp: 90 tablet, Rfl: 1   ONETOUCH ULTRA test strip, USE TO TEST UP TO FOUR TIMES A DAY, Disp: 300 strip, Rfl: 2   pregabalin (LYRICA) 75 MG capsule, Take 1 capsule (75 mg total) by mouth 3 (three) times daily., Disp: 270 capsule, Rfl: 0   vitamin B-12 (CYANOCOBALAMIN) 1000 MCG tablet, Take 1,000 mcg by mouth every morning., Disp: , Rfl:  DULoxetine (CYMBALTA) 30 MG capsule, Take 1-2 capsules (30-60 mg total) by mouth daily. One for two weeks after that 2 daily, Disp: 60 capsule, Rfl: 0  No Known Allergies  I personally reviewed active problem list, medication list, allergies, family history, social history, health maintenance with the patient/caregiver today.   ROS  Constitutional: Negative for fever or weight change.  Respiratory: Negative for cough and shortness of breath.   Cardiovascular: Negative for chest pain or palpitations.  Gastrointestinal: Negative for abdominal pain, no bowel changes.  Musculoskeletal: Negative for gait problem or joint swelling.  Skin: Negative for rash.  Neurological: Negative for dizziness or headache.  No other specific complaints in a complete review of systems (except as listed in HPI above).  Objective  Vitals:   06/23/20 1059  BP: 112/70  Pulse: 83  Resp: 16  Temp: 98 F (36.7 C)  TempSrc: Oral  SpO2: 99%  Weight: 185 lb (83.9 kg)  Height: $Remove'5\' 1"'yeajMOu$  (1.549 m)    Body mass index is 34.96 kg/m.  Physical Exam  Constitutional: Patient appears well-developed and well-nourished. Obese No distress.  HEENT: head atraumatic, normocephalic, pupils equal and reactive to light,  neck supple Cardiovascular:  Normal rate, regular rhythm and normal heart sounds.  No murmur heard. Trace BLE edema. Pulmonary/Chest: Effort normal and breath sounds normal. No respiratory distress. Abdominal: Soft.  There is no tenderness. Muscular Skeletal: no pain during palpation of lumbar spine Psychiatric: Patient has a normal mood and affect. behavior is normal. Judgment and thought content normal. Skin: excoriation on right arm  Recent Results (from the past 2160 hour(s))  POCT HgB A1C     Status: Abnormal   Collection Time: 03/31/20 11:57 AM  Result Value Ref Range   Hemoglobin A1C 11.0 (A) 4.0 - 5.6 %   HbA1c POC (<> result, manual entry)     HbA1c, POC (prediabetic range)     HbA1c, POC (controlled diabetic range)    Renal Function Panel     Status: Abnormal   Collection Time: 03/31/20 12:52 PM  Result Value Ref Range   Glucose, Bld 250 (H) 65 - 99 mg/dL    Comment: .            Fasting reference interval . For someone without known diabetes, a glucose value >125 mg/dL indicates that they [place] have diabetes and this should be confirmed with a follow-up test. .    BUN 20 7 - 25 mg/dL   Creat 0.92 (H) 0.60 - 0.88 mg/dL    Comment: For patients >61 years of age, the reference limit for Creatinine is approximately 13% higher for people identified as African-American. .    BUN/Creatinine Ratio 22 6 - 22 (calc)   Sodium 141 135 - 146 mmol/L   Potassium 4.4 3.5 - 5.3 mmol/L   Chloride 104 98 - 110 mmol/L   CO2 29 20 - 32 mmol/L   Calcium 9.4 8.6 - 10.4 mg/dL   Phosphorus 3.9 2.1 - 4.3 mg/dL   Albumin 4.4 3.6 - 5.1 g/dL  TSH     Status: Abnormal   Collection Time: 03/31/20 12:52 PM  Result Value Ref Range   TSH 0.34 (L) 0.40 - 4.50 mIU/L  POCT HgB A1C     Status: Abnormal   Collection Time: 06/23/20 11:05 AM  Result Value Ref Range   Hemoglobin A1C 6.7 (A) 4.0 - 5.6 %   HbA1c POC (<> result, manual entry)     HbA1c, POC (prediabetic range)  HbA1c, POC (controlled diabetic range)         PHQ2/9: Depression screen [facility] 2/9 06/23/2020 06/23/2020 12/19/2019 12/04/2019 09/19/2019  Decreased Interest 3 3 0 0 0  Down, Depressed, Hopeless 3 3 0 0 0  PHQ - 2 Score 6 6 0 0 0  Altered sleeping 2 - 0 - 1  Tired, decreased energy 2 - 0 - 1  Change in appetite 3 - 0 - 0  Feeling bad or failure about yourself  2 - 0 - 0  Trouble concentrating 2 - 0 - 1  Moving slowly or fidgety/restless 3 - 0 - 0  Suicidal thoughts 2 - 0 - 0  PHQ-9 Score 22 - 0 - 3  Difficult doing work/chores Somewhat difficult - - - Somewhat difficult  Some recent data might be hidden    phq 9 is positive   Fall Risk: Fall Risk  06/23/2020 12/19/2019 12/04/2019 09/19/2019 06/29/2019  Falls in the past year? 0 0 0 0 0  Number falls in past yr: 0 0 0 0 -  Injury with Fall? 0 0 0 0 -  Risk for fall due to : - - Impaired vision;Impaired balance/gait - -  Risk for fall due to: Comment - - - - -  Follow up - - Falls prevention discussed - -     Functional Status Survey: Is the patient deaf or have difficulty hearing?: Yes Does the patient have difficulty seeing, even when wearing glasses/contacts?: No Does the patient have difficulty concentrating, remembering, or making decisions?: Yes Does the patient have difficulty walking or climbing stairs?: Yes Does the patient have difficulty dressing or bathing?: No Does the patient have difficulty doing errands alone such as visiting a doctor's office or shopping?: Yes   Assessment & Plan  1. Controlled type 2 diabetes mellitus with microalbuminuria, unspecified whether long term insulin use (HCC)  - POCT HgB A1C  2. Need for immunization against influenza  - Flu Vaccine QUAD High Dose(Fluad)  3. Need for Tdap vaccination  - Tdap vaccine greater than or equal to 7yo IM  4. Hypothyroidism, unspecified type  Recheck TSH today  5. Pure hypercholesterolemia   6. Osteoporosis, post-menopausal  - Ambulatory referral to Endocrinology  7. Benign  hypertension   8. Major depression, recurrent, chronic (HCC)  - DULoxetine (CYMBALTA) 30 MG capsule; Take 1-2 capsules (30-60 mg total) by mouth daily. One for two weeks after that 2 daily  Dispense: 60 capsule; Refill: 0  9. Excoriation of right upper arm, initial encounter  Tdap today

## 2020-07-03 ENCOUNTER — Encounter: Payer: Self-pay | Admitting: Internal Medicine

## 2020-07-03 ENCOUNTER — Other Ambulatory Visit: Payer: Self-pay

## 2020-07-03 ENCOUNTER — Ambulatory Visit: Payer: Self-pay | Admitting: *Deleted

## 2020-07-03 ENCOUNTER — Ambulatory Visit (INDEPENDENT_AMBULATORY_CARE_PROVIDER_SITE_OTHER): Payer: PPO | Admitting: Internal Medicine

## 2020-07-03 DIAGNOSIS — R809 Proteinuria, unspecified: Secondary | ICD-10-CM

## 2020-07-03 DIAGNOSIS — E1129 Type 2 diabetes mellitus with other diabetic kidney complication: Secondary | ICD-10-CM | POA: Diagnosis not present

## 2020-07-03 DIAGNOSIS — F339 Major depressive disorder, recurrent, unspecified: Secondary | ICD-10-CM | POA: Diagnosis not present

## 2020-07-03 DIAGNOSIS — W57XXXA Bitten or stung by nonvenomous insect and other nonvenomous arthropods, initial encounter: Secondary | ICD-10-CM | POA: Diagnosis not present

## 2020-07-03 DIAGNOSIS — S70361A Insect bite (nonvenomous), right thigh, initial encounter: Secondary | ICD-10-CM

## 2020-07-03 DIAGNOSIS — L03115 Cellulitis of right lower limb: Secondary | ICD-10-CM | POA: Diagnosis not present

## 2020-07-03 DIAGNOSIS — E1165 Type 2 diabetes mellitus with hyperglycemia: Secondary | ICD-10-CM

## 2020-07-03 DIAGNOSIS — IMO0002 Reserved for concepts with insufficient information to code with codable children: Secondary | ICD-10-CM

## 2020-07-03 MED ORDER — CEPHALEXIN 500 MG PO CAPS: 500.0000 mg | ORAL_CAPSULE | Freq: Three times a day (TID) | ORAL | 0 refills | Status: AC

## 2020-07-03 NOTE — Progress Notes (Signed)
Patient ID: Lindsey [name], female    DOB: Feb 13, 1941, 79 y.o.   MRN: [id number]  PCP: [name], MD  Chief Complaint  Patient presents with  . Insect Bite    Subjective:   Lindsey [name] is a 79 y.o. female, presents to clinic with CC of the following:  Chief Complaint  Patient presents with  . Insect Bite    HPI:  Patient is an 79 year old female patient of Dr. Ancil Boozer last visit with her was 06/23/2020, next follow-up appointment scheduled in December. follows up today after a message from the triage nurse this morning was as follows:  Pt's daughter called because it was possible she was bitten by a spider; the pt has a bite on her leg with a bright red spot; the pt says she found it yesterday; her daughter is not with her and is not sure what leg the bite is on; she is concerned because the pt is diabetic, and she was not notified of the incident until 2100 07/03/20; she is not with the pt; she says the pt does have some dementia and is hard of hearing; the pt's daughter would like for her to be seen in the office today; the pt sees Dr Ancil Boozer but she has no availability today; pt's daugher offered and accepted appt with Dr Roxan Hockey 07/03/20 at 1320; she verbalized understanding; will route to office for notification.  Patient follows up today with her daughter present. She first noticed this area of redness on her upper right thigh yesterday, and is unclear exactly how it happened.  She does not recall any insect or spider bite, notes it is not painful, and some redness has been noticed surrounding the central area.  She has noted no discharge from this area, and has applied peroxide to help manage.  No numbness or tingling down the leg, she denies any fevers or feeling ill.  She is a diabetic which raised concerns for the daughter as well.    Patient Active Problem List   Diagnosis Date Noted  . History of breast cancer 06/28/2018  . Bilateral lower extremity edema  01/16/2016  . Breast cancer, left breast ([place]) 11/05/2015  . 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 Morbid (severe) obesity due to excess calories ([place]) 08/26/2015  . Controlled type 2 diabetes mellitus with microalbuminuria ([place]) 04/25/2015  . Hyperlipidemia 04/25/2015  . Benign hypertension 04/25/2015  . Adult hypothyroidism 04/25/2015  . At risk for falling 04/25/2015  . Major depression ([place]) 04/25/2015  . Post menopausal syndrome 04/25/2015      Current Outpatient Medications:  .  acetaminophen (TYLENOL) 325 MG tablet, Take 2 tablets (650 mg total) by mouth every 6 (six) hours., Disp: , Rfl:  .  alendronate (FOSAMAX) 70 MG tablet, Take 1 tablet (70 mg total) by mouth once a week., Disp: 12 tablet, Rfl: 3 .  aspirin 81 MG tablet, Take by mouth., Disp: , Rfl:  .  atorvastatin (LIPITOR) 40 MG tablet, Take 1 tablet (40 mg total) by mouth daily., Disp: 90 tablet, Rfl: 1 .  B-D UF III MINI PEN NEEDLES 31G X 5 MM MISC, USE AS DIRECTED BY DOCTOR, USE TO INJECT INSULIN, Disp: 100 each, Rfl: 3 .  blood glucose meter kit and supplies, Dispense based on patient and insurance preference. Use up to four times daily as directed. (FOR ICD-10 E10.9, E11.9)., Disp: 1 each, Rfl: 0 .  calcium-vitamin D (OSCAL WITH D) 500-200 MG-UNIT tablet, Take 1 tablet  by mouth daily. 627m calcium 800 units Vitamin D, Disp: , Rfl:  .  cholecalciferol (VITAMIN D) 1000 UNITS tablet, Take by mouth., Disp: , Rfl:  .  DULoxetine (CYMBALTA) 30 MG capsule, Take 1-2 capsules (30-60 mg total) by mouth daily. One for two weeks after that 2 daily, Disp: 60 capsule, Rfl: 0 .  insulin degludec (TRESIBA FLEXTOUCH) 100 UNIT/ML FlexTouch Pen, Inject 0.1 mLs (10 Units total) into the skin daily., Disp: 15 mL, Rfl: 0 .  ketoconazole (NIZORAL) 2 % cream, APPLY TO THE SKIN TWICE A DAY TO INFRAMAMMARY AND GROIN AS DIRECTED, Disp: , Rfl: 0 .  Lancets (ONETOUCH DELICA PLUS [id number] MISC, USE TO TEST UP TO FOUR TIMES A DAY AS  DIRECTED, Disp: , Rfl:  .  letrozole (FEMARA) 2.5 MG tablet, Take 2.5 mg by mouth daily., Disp: , Rfl:  .  levothyroxine (SYNTHROID) 112 MCG tablet, Take 1 tablet (112 mcg total) by mouth daily before breakfast. But on Sundays only take half pill and recheck level in 6 weeks, Disp: 30 tablet, Rfl: 1 .  lisinopril (ZESTRIL) 40 MG tablet, Take 1 tablet (40 mg total) by mouth at bedtime., Disp: 90 tablet, Rfl: 1 .  ONETOUCH ULTRA test strip, USE TO TEST UP TO FOUR TIMES A DAY, Disp: 300 strip, Rfl: 2 .  pregabalin (LYRICA) 75 MG capsule, Take 1 capsule (75 mg total) by mouth 3 (three) times daily., Disp: 270 capsule, Rfl: 0 .  vitamin B-12 (CYANOCOBALAMIN) 1000 MCG tablet, Take 1,000 mcg by mouth every morning., Disp: , Rfl:    No Known Allergies   Past Surgical History:  Procedure Laterality Date  . BREAST BIOPSY Left 11-04-15  . BREAST BIOPSY Left 10-10-07  . CATARACT EXTRACTION Bilateral 1990  . CHOLECYSTECTOMY    . MASTECTOMY Right 1983   Dr JMyrle Sheng . MASTECTOMY Right 1983  . MASTECTOMY W/ SENTINEL NODE BIOPSY Left 12/12/2015   Procedure: MASTECTOMY WITH SENTINEL LYMPH NODE BIOPSY;  Surgeon: SChristene Lye MD;  Location: [facility];  Service: General;  Laterality: Left;  . ORIF ULNAR FRACTURE Left 12/11/2018   Procedure: OPEN REDUCTION INTERNAL FIXATION (ORIF) LEFT PROXIMAL ULNA FRACTURE AND  RADIAL HEAD REPLACEMENT;  Surgeon: TMilly Jakob MD;  [place] [facility]  Service: Orthopedics;  Laterality: Left;  . RADIAL HEAD ARTHROPLASTY Left 12/11/2018   Procedure: OPEN REDUCTION INTERNAL FIXATION (ORIF) LEFT PROXIMAL ULNA FRACTURE AND  RADIAL HEAD REPLACEMENT;  Surgeon: TMilly Jakob MD;  [place] [facility]  Service: Orthopedics;  Laterality: Left;     Family History  Problem Relation Age of Onset  . Kidney disease Mother   . Heart disease Father   . Stroke Sister   . Cancer Sister        breast  . Breast cancer Sister   . Stroke  Brother   . Diabetes Brother   . Hypertension Brother   . Cancer Daughter [phone]      DCIS/lumpectomy/genetic negative  . Breast cancer Daughter      Social History   Tobacco Use  . Smoking status: Never Smoker  . Smokeless tobacco: Never Used  . Tobacco comment: Non-smoker. Smoking cessation materials contraindicated  Substance Use Topics  . Alcohol use: No    Alcohol/week: 0.0 standard drinks    With staff assistance, above reviewed with the patient today.  ROS: As per HPI, otherwise no specific complaints on a limited and focused system review   No results found for this  or any previous visit (from the past 72 hour(s)).   PHQ2/9: Depression screen [facility] 2/9 07/03/2020 06/23/2020 06/23/2020 12/19/2019 12/04/2019  Decreased Interest _0 0 0  Down, Depressed, Hopeless _1 0 0  PHQ - 2 Score _2 0 0  Altered sleeping 2 2 - 0 -  Tired, decreased energy 2 2 - 0 -  Change in appetite 3 3 - 0 -  Feeling bad or failure about yourself  2 2 - 0 -  Trouble concentrating 2 2 - 0 -  Moving slowly or fidgety/restless 3 3 - 0 -  Suicidal thoughts 2 2 - 0 -  PHQ-9 Score 20 22 - 0 -  Difficult doing work/chores Somewhat difficult Somewhat difficult - - -  Some recent data might be hidden   PHQ-2/9 Result reviewed, followed with Dr. Ancil Boozer.   Fall Risk: Fall Risk  07/03/2020 06/23/2020 12/19/2019 12/04/2019 09/19/2019  Falls in the past year? 0 0 0 0 0  Number falls in past yr: 0 0 0 0 0  Injury with Fall? 0 0 0 0 0  Risk for fall due to : - - - Impaired vision;Impaired balance/gait -  Risk for fall due to: Comment - - - - -  Follow up - - - Falls prevention discussed -      Objective:   There were no vitals filed for this visit.  There is no height or weight on file to calculate BMI. No fever with temperature 98.8 noted Physical Exam   NAD, masked, pleasant HEENT - [place]/AT, sclera anicteric,  Neck - supple, no adenopathy, no TM, carotids 2+ and = without bruits bilat Car -  RRR, not tachycardic Pulm- RR and effort normal at rest,  Skin-on the proximal right anterior thigh, a tiny puncture wound was present with very dark discoloration right at the tiny puncture site, a slightly less than dime sized circular deeper red discoloration surrounded the tiny puncture site, with a lighter ovoid erythematous area extending to about half dollar size from the puncture site.  There was no induration, no marked tenderness palpating the area surrounding the tiny puncture site, no obvious foreign body evident within the puncture site (and she noted no pinching feeling when pressing the area immediately surrounding the puncture site).  No marked swelling noted, no vesicular component, Neuro/psychiatric - affect was not flat, appropriate with conversation  Alert with normal speech  She was able to get up from the chair and onto the exam table, with assistance helping up the step to the exam table.    Results for orders placed or performed in visit on 06/23/20  POCT HgB A1C  Result Value Ref Range   Hemoglobin A1C 6.7 (A) 4.0 - 5.6 %   HbA1c POC (<> result, manual entry)     HbA1c, POC (prediabetic range)     HbA1c, POC (controlled diabetic range)         Assessment & Plan:  1. Leg wound - Likely bug or spider bite of right thigh, initial encounter 2. Cellulitis of right lower extremity - mild Noted it is not entirely clear of the source of this wound, and she does not recall seeing any bug or spider that [place] have been a source of a bite.  No pain concerns noted, and just noticed this area yesterday.  No large area of necrotic tissue concerns, with just the very dark area at the very tiny puncture site centrally.  Would be a  little atypical for a recluse spider bite in November, although cannot totally exclude.  Very important to closely monitor noted, and if the very tiny central darkish area expanding, or the redness expanding as discussed today, she should follow-up again to be  assessed. No obvious foreign body component evident at the very tiny puncture site presently. Recommended keeping the area clean with warm soapy water several times daily, and cleansed in the office today with a sterile saline product with crystals help. Also recommended keeping loosely covered with a gauze type dressing when up and about and having pants over top that could rub up against this, and if this was was done in the office here today. Noting her diabetes history, and evidence of some mild cellulitis concern, felt best to add a Keflex product-500 mg 3 times daily for 5 days. Discussed there is no abscess concerns, and she has no history of MRSA, and do not feel adding a medicine that provides coverage for this is needed presently.   3. Uncontrolled type 2 diabetes mellitus with microalbuminuria ([place]) As noted above, felt best to add the antibiotic presently.  4. Major depression, recurrent, chronic (HCC) PHQ positive as it was on her last follow-up with Dr. Ancil Boozer, and continued follow-up with her as planned with another appointment next month.    As discussed with the daughter present today, the importance of closely monitoring and if develops fevers, the wound site is expanding with any significance, or other more concerning symptoms arising in association, she needs to be seen again for follow-up more urgently, and they were understanding of that.     Towanda Malkin, MD 07/03/20 2:43 PM

## 2020-07-03 NOTE — Telephone Encounter (Signed)
Pt's daughter called because it was possible she was bitten by a spider; the pt has a bite on her leg with a bright red spot; the pt says she found it yesterday; her daughter is not with her and is not sure what leg the bite is on; she is concerned because the pt is diabetic, and she was not notified of the incident until 2100 07/03/20; she is not with the pt; she says the pt does have some dementia and is hard of hearing; the pt's daughter would like for her to be seen in the office today; the pt sees Dr Ancil Boozer but she has no availability today; pt's daugher offered and accepted appt with Dr Roxan Hockey 07/03/20 at 1320; she verbalized understanding; will route to office for notification.  Reason for Disposition  [1] Red or very tender (to touch) area AND [2] started over 24 hours after the bite  Answer Assessment - Initial Assessment Questions 1. TYPE of INSECT: "What type of insect was it?"      ? spider 2. ONSET: "When did you get bitten?"      *No Answer* 3. LOCATION: "Where is the insect bite located?"      *No Answer* 4. REDNESS: "Is the area red or pink?" If Yes, ask: "What size is area of redness?" (inches or cm). "When did the redness start?"     yes 5. PAIN: "Is there any pain?" If Yes, ask: "How bad is it?"  (Scale 1-10; or mild, moderate, severe)     *No Answer* 6. ITCHING: "Does it itch?" If Yes, ask: "How bad is the itch?"    - MILD: doesn't interfere with normal activities   - MODERATE-SEVERE: interferes with work, school, sleep, or other activities      *No Answer* 7. SWELLING: "How big is the swelling?" (inches, cm, or compare to coins)     *No Answer* 8. OTHER SYMPTOMS: "Do you have any other symptoms?"  (e.g., difficulty breathing, hives)     *No Answer* 9. PREGNANCY: "Is there any chance you are pregnant?" "When was your last menstrual period?" no  Protocols used: INSECT BITE-A-AH

## 2020-07-03 NOTE — Patient Instructions (Addendum)
As we discussed, the exact source is unclear, likely a bug or spider bite.  Keep the area clean using warm soapy water several times a day, and keep loosely covered with a gauze dressing during the day as needed.  An antibiotic was prescribed to the pharmacy to take to help.  Continue to closely monitor as we discussed today and following up if not improving or worsening emphasized

## 2020-07-10 ENCOUNTER — Other Ambulatory Visit: Payer: Self-pay | Admitting: Family Medicine

## 2020-07-10 ENCOUNTER — Other Ambulatory Visit: Payer: Self-pay | Admitting: Internal Medicine

## 2020-07-10 DIAGNOSIS — I10 Essential (primary) hypertension: Secondary | ICD-10-CM

## 2020-07-10 DIAGNOSIS — L03115 Cellulitis of right lower limb: Secondary | ICD-10-CM

## 2020-07-11 NOTE — Telephone Encounter (Signed)
I received a request to refill her antibiotic. I did not refill it. Please contact the patient and let her know if she is not improving or is worsening, she needs a follow up and to be re-assessed. Thanks, [facility]

## 2020-07-14 NOTE — Telephone Encounter (Signed)
LVM for patient to make an appt for reassessment.

## 2020-07-15 ENCOUNTER — Other Ambulatory Visit: Payer: Self-pay | Admitting: Family Medicine

## 2020-07-16 DIAGNOSIS — M1711 Unilateral primary osteoarthritis, right knee: Secondary | ICD-10-CM | POA: Diagnosis not present

## 2020-07-16 DIAGNOSIS — M1712 Unilateral primary osteoarthritis, left knee: Secondary | ICD-10-CM | POA: Diagnosis not present

## 2020-07-20 ENCOUNTER — Other Ambulatory Visit: Payer: Self-pay | Admitting: Family Medicine

## 2020-07-20 DIAGNOSIS — E039 Hypothyroidism, unspecified: Secondary | ICD-10-CM

## 2020-07-20 NOTE — Telephone Encounter (Signed)
Requested Prescriptions  Pending Prescriptions Disp Refills  . levothyroxine (SYNTHROID) 112 MCG tablet [Pharmacy Med Name: LEVOTHYROXINE 0.112MG  (112MCG) TABS] 23 tablet 0    Sig: TAKE 1 TABLET BY MOUTH EVERY DAY BEFORE BREAKFAST. ON SUNDAYS ONLY TAKE 1/2 TABLET AND RECHECK LEVEL IN 6 WEEKS     Endocrinology:  Hypothyroid Agents Failed - 07/20/2020 12:40 PM      Failed - TSH needs to be rechecked within 3 months after an abnormal result. Refill until TSH is due.      Failed - TSH in normal range and within 360 days    TSH  Date Value Ref Range Status  03/31/2020 0.34 (L) 0.40 - 4.50 mIU/L Final         Passed - Valid encounter within last 12 months    Recent Outpatient Visits          2 weeks ago Insect bite of right thigh, initial encounter   [facility] Lebron Conners D, MD   3 weeks ago Controlled type 2 diabetes mellitus with microalbuminuria, unspecified whether long term insulin use [facility])   [facility] [place], Drue Stager, MD   3 months ago Uncontrolled type 2 diabetes mellitus with microalbuminuria [facility])   [facility] [name], MD   7 months ago Dyslipidemia associated with type 2 diabetes mellitus [facility])   [facility] [name], MD   10 months ago Dyslipidemia associated with type 2 diabetes mellitus [facility])   [facility] [name], MD      Future Appointments            In 3 weeks Ancil Boozer, Drue Stager, MD [facility], [facility]

## 2020-07-29 DIAGNOSIS — Z7982 Long term (current) use of aspirin: Secondary | ICD-10-CM | POA: Diagnosis not present

## 2020-07-29 DIAGNOSIS — Z853 Personal history of malignant neoplasm of breast: Secondary | ICD-10-CM | POA: Diagnosis not present

## 2020-07-29 DIAGNOSIS — S70361D Insect bite (nonvenomous), right thigh, subsequent encounter: Secondary | ICD-10-CM | POA: Diagnosis not present

## 2020-07-29 DIAGNOSIS — E039 Hypothyroidism, unspecified: Secondary | ICD-10-CM | POA: Diagnosis not present

## 2020-07-29 DIAGNOSIS — Z794 Long term (current) use of insulin: Secondary | ICD-10-CM | POA: Diagnosis not present

## 2020-07-29 DIAGNOSIS — E1129 Type 2 diabetes mellitus with other diabetic kidney complication: Secondary | ICD-10-CM | POA: Diagnosis not present

## 2020-07-29 DIAGNOSIS — M810 Age-related osteoporosis without current pathological fracture: Secondary | ICD-10-CM | POA: Diagnosis not present

## 2020-07-29 DIAGNOSIS — G2581 Restless legs syndrome: Secondary | ICD-10-CM | POA: Diagnosis not present

## 2020-07-29 DIAGNOSIS — E785 Hyperlipidemia, unspecified: Secondary | ICD-10-CM | POA: Diagnosis not present

## 2020-07-29 DIAGNOSIS — L03116 Cellulitis of left lower limb: Secondary | ICD-10-CM | POA: Diagnosis not present

## 2020-07-29 DIAGNOSIS — M170 Bilateral primary osteoarthritis of knee: Secondary | ICD-10-CM | POA: Diagnosis not present

## 2020-07-29 DIAGNOSIS — I10 Essential (primary) hypertension: Secondary | ICD-10-CM | POA: Diagnosis not present

## 2020-07-29 DIAGNOSIS — M545 Low back pain, unspecified: Secondary | ICD-10-CM | POA: Diagnosis not present

## 2020-07-29 DIAGNOSIS — G8929 Other chronic pain: Secondary | ICD-10-CM | POA: Diagnosis not present

## 2020-07-29 DIAGNOSIS — F339 Major depressive disorder, recurrent, unspecified: Secondary | ICD-10-CM | POA: Diagnosis not present

## 2020-07-29 DIAGNOSIS — M47816 Spondylosis without myelopathy or radiculopathy, lumbar region: Secondary | ICD-10-CM | POA: Diagnosis not present

## 2020-08-11 NOTE — Progress Notes (Addendum)
Name: Lindsey [name]   MRN: [id number]    DOB: 11/06/1940   Date:08/12/2020       Progress Note  Subjective  Chief Complaint  Follow up   HPI   DMII: she used to have microalbuminuria, but has been on ACE and is doing well, last urine micro negative. Also low HDL, on statin therapy. Denies polyphagia, polyuria or polydipsia. She does not check her glucose at home. She is taking aspirin ( discussed new guidelines -but she wants to continue medication). HgbA1Cwent from 7.1 % to 7.8%to 7.3%and up to 11 % on her last visit, today it was down to 6.7%,. She states her daughter-in-law  checks her sugar in the afternoon and still goes and down, last week it went up to 400 once. She is not  sure why   Hypothyroidism: she has been on Synthroid 112 mcg once daily and only half pill on Sunday , last TSH was suppressed and we recheck level today . She has mild hair loss, she is always constipated but no change. Also has dry skin that is stable   Major Depression: She states that she has been feeling worse not, continues to worry about her husband, she is having increase in back pain also has knee pain and gets discouraged and frustrated that she can not do as much as she used to . During her last visit 6 weeks ago we changed from Zoloft to Duloxetine She states she has not noticed any improvement of her mood or pain level . We had discussed a gratitude journal but she did not start it yet . Phq 9 has improved from 20 to 13 , we will adjust dose from 30 to 60 and recheck next visit in 3 months   Intertrigo: itchy and red rash intermittently under abdominal fold, she would like a refill of nizoral today   Patient Active Problem List   Diagnosis Date Noted  . History of breast cancer 06/28/2018  . Bilateral lower extremity edema 01/16/2016  . Breast cancer, left breast ([place]) 11/05/2015  . 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 Morbid (severe) obesity due to excess calories ([place]) 08/26/2015  .  Controlled type 2 diabetes mellitus with microalbuminuria ([place]) 04/25/2015  . Hyperlipidemia 04/25/2015  . Benign hypertension 04/25/2015  . Adult hypothyroidism 04/25/2015  . At risk for falling 04/25/2015  . Major depression ([place]) 04/25/2015  . Post menopausal syndrome 04/25/2015    Past Surgical History:  Procedure Laterality Date  . BREAST BIOPSY Left 11-04-15  . BREAST BIOPSY Left 10-10-07  . CATARACT EXTRACTION Bilateral 1990  . CHOLECYSTECTOMY    . MASTECTOMY Right 1983   Dr Myrle Sheng  . MASTECTOMY Right 1983  . MASTECTOMY W/ SENTINEL NODE BIOPSY Left 12/12/2015   Procedure: MASTECTOMY WITH SENTINEL LYMPH NODE BIOPSY;  Surgeon: Christene Lye, MD;  Location: [facility];  Service: General;  Laterality: Left;  . ORIF ULNAR FRACTURE Left 12/11/2018   Procedure: OPEN REDUCTION INTERNAL FIXATION (ORIF) LEFT PROXIMAL ULNA FRACTURE AND  RADIAL HEAD REPLACEMENT;  Surgeon: Milly Jakob, MD;  Location: [place];  Service: Orthopedics;  Laterality: Left;  . RADIAL HEAD ARTHROPLASTY Left 12/11/2018   Procedure: OPEN REDUCTION INTERNAL FIXATION (ORIF) LEFT PROXIMAL ULNA FRACTURE AND  RADIAL HEAD REPLACEMENT;  Surgeon: Milly Jakob, MD;  Location: [place];  Service: Orthopedics;  Laterality: Left;    Family History  Problem Relation Age of Onset  . Kidney disease Mother   . Heart disease Father   .  Stroke Sister   . Cancer Sister        breast  . Breast cancer Sister   . Stroke Brother   . Diabetes Brother   . Hypertension Brother   . Cancer Daughter 45       DCIS/lumpectomy/genetic negative  . Breast cancer Daughter     Social History   Tobacco Use  . Smoking status: Never Smoker  . Smokeless tobacco: Never Used  . Tobacco comment: Non-smoker. Smoking cessation materials contraindicated  Substance Use Topics  . Alcohol use: No    Alcohol/week: 0.0 standard drinks     Current Outpatient Medications:  .  acetaminophen (TYLENOL)  325 MG tablet, Take 2 tablets (650 mg total) by mouth every 6 (six) hours., Disp: , Rfl:  .  alendronate (FOSAMAX) 70 MG tablet, Take 1 tablet (70 mg total) by mouth once a week., Disp: 12 tablet, Rfl: 3 .  aspirin 81 MG tablet, Take by mouth., Disp: , Rfl:  .  atorvastatin (LIPITOR) 40 MG tablet, Take 1 tablet (40 mg total) by mouth daily., Disp: 90 tablet, Rfl: 1 .  B-D UF III MINI PEN NEEDLES 31G X 5 MM MISC, USE AS DIRECTED BY DOCTOR, USE TO INJECT INSULIN, Disp: 100 each, Rfl: 3 .  blood glucose meter kit and supplies, Dispense based on patient and insurance preference. Use up to four times daily as directed. (FOR ICD-10 E10.9, E11.9)., Disp: 1 each, Rfl: 0 .  calcium-vitamin D (OSCAL WITH D) 500-200 MG-UNIT tablet, Take 1 tablet by mouth daily. 631m calcium 800 units Vitamin D, Disp: , Rfl:  .  cholecalciferol (VITAMIN D) 1000 UNITS tablet, Take by mouth., Disp: , Rfl:  .  DULoxetine (CYMBALTA) 30 MG capsule, Take 1-2 capsules (30-60 mg total) by mouth daily. One for two weeks after that 2 daily, Disp: 60 capsule, Rfl: 0 .  insulin degludec (TRESIBA FLEXTOUCH) 100 UNIT/ML FlexTouch Pen, Inject 0.1 mLs (10 Units total) into the skin daily., Disp: 15 mL, Rfl: 0 .  ketoconazole (NIZORAL) 2 % cream, APPLY TO THE SKIN TWICE A DAY TO INFRAMAMMARY AND GROIN AS DIRECTED, Disp: , Rfl: 0 .  Lancets (ONETOUCH DELICA PLUS [id number] MISC, USE TO TEST UP TO FOUR TIMES A DAY AS DIRECTED, Disp: , Rfl:  .  letrozole (FEMARA) 2.5 MG tablet, Take 2.5 mg by mouth daily., Disp: , Rfl:  .  levothyroxine (SYNTHROID) 112 MCG tablet, TAKE 1 TABLET BY MOUTH EVERY DAY BEFORE BREAKFAST. ON SUNDAYS ONLY TAKE 1/2 TABLET AND RECHECK LEVEL IN 6 WEEKS, Disp: 23 tablet, Rfl: 0 .  lisinopril (ZESTRIL) 40 MG tablet, Take 1 tablet (40 mg total) by mouth at bedtime., Disp: 90 tablet, Rfl: 1 .  ONETOUCH ULTRA test strip, USE TO TEST UP TO FOUR TIMES A DAY, Disp: 300 strip, Rfl: 2 .  pregabalin (LYRICA) 75 MG capsule, Take 1  capsule (75 mg total) by mouth 3 (three) times daily., Disp: 270 capsule, Rfl: 0 .  vitamin B-12 (CYANOCOBALAMIN) 1000 MCG tablet, Take 1,000 mcg by mouth every morning., Disp: , Rfl:   No Known Allergies  I personally reviewed active problem list, medication list, allergies, family history, social history, health maintenance with the patient/caregiver today.   ROS  Ten systems reviewed and is negative except as mentioned in HPI   Objective  Vitals:   08/12/20 0948  BP: 136/80  Pulse: 85  Resp: 16  Temp: 98.2 F (36.8 C)  TempSrc: Oral  SpO2: 98%  Weight: 182 lb  11.2 oz (82.9 kg)  Height: _0  (1.549 m)    Body mass index is 34.52 kg/m.  Physical Exam  Constitutional: Patient appears well-developed and well-nourished. Obese  No distress.  HEENT: head atraumatic, normocephalic, pupils equal and reactive to light,neck supple Cardiovascular: Normal rate, regular rhythm and normal heart sounds.  No murmur heard. No BLE edema. Pulmonary/Chest: Effort normal and breath sounds normal. No respiratory distress. Abdominal: Soft.  There is no tenderness. Skin: senile purpura  Psychiatric: Patient has a normal mood and affect. behavior is normal. Judgment and thought content normal.  Recent Results (from the past 2160 hour(s))  POCT HgB A1C     Status: Abnormal   Collection Time: 06/23/20 11:05 AM  Result Value Ref Range   Hemoglobin A1C 6.7 (A) 4.0 - 5.6 %   HbA1c POC (<> result, manual entry)     HbA1c, POC (prediabetic range)     HbA1c, POC (controlled diabetic range)        PHQ2/9: Depression screen [facility] 2/9 08/12/2020 07/03/2020 06/23/2020 06/23/2020 12/19/2019  Decreased Interest _1 0  Down, Depressed, Hopeless _2 0  PHQ - 2 Score _3 0  Altered sleeping _4 - 0  Tired, decreased energy _5 - 0  Change in appetite 0 3 3 - 0  Feeling bad or failure about yourself  _6 - 0  Trouble concentrating 0 2 2 - 0  Moving slowly or fidgety/restless _7 - 0  Suicidal thoughts 0 2 2 - 0  PHQ-9 Score _8 - 0  Difficult doing work/chores Very difficult Somewhat difficult Somewhat difficult - -  Some recent data might be hidden    phq 9 is positive   Fall Risk: Fall Risk  08/12/2020 07/03/2020 06/23/2020 12/19/2019 12/04/2019  Falls in the past year? 0 0 0 0 0  Number falls in past yr: 0 0 0 0 0  Injury with Fall? 0 0 0 0 0  Risk for fall due to : - - - - Impaired vision;Impaired balance/gait  Risk for fall due to: Comment - - - - -  Follow up - - - - Falls prevention discussed     Assessment & Plan  1. Controlled type 2 diabetes mellitus with microalbuminuria, unspecified whether long term insulin use ([place])  Continue monitoring glucose at home, and pay attention on diet   2. Adult hypothyroidism  Recheck TSH today   3. Chronic pain syndrome  We will increase dose of duloxetine from 30 to 60 mg  4. Major depression, recurrent, chronic (H[facility])  - DULoxetine (CYMBALTA) 60 MG capsule; Take 1 capsule (60 mg total) by mouth daily.  Dispense: 90 capsule; Refill: 0  5. Intertrigo  - ketoconazole (NIZORAL) 2 % cream; Apply topically 2 (two) times daily.  Dispense: 60 g; Refill: 1

## 2020-08-12 ENCOUNTER — Ambulatory Visit (INDEPENDENT_AMBULATORY_CARE_PROVIDER_SITE_OTHER): Payer: PPO | Admitting: Family Medicine

## 2020-08-12 ENCOUNTER — Other Ambulatory Visit: Payer: Self-pay

## 2020-08-12 ENCOUNTER — Encounter: Payer: Self-pay | Admitting: Family Medicine

## 2020-08-12 VITALS — BP 136/80 | HR 85 | Temp 98.2°F | Resp 16 | Ht 61.0 in | Wt 182.7 lb

## 2020-08-12 DIAGNOSIS — E1129 Type 2 diabetes mellitus with other diabetic kidney complication: Secondary | ICD-10-CM

## 2020-08-12 DIAGNOSIS — E039 Hypothyroidism, unspecified: Secondary | ICD-10-CM

## 2020-08-12 DIAGNOSIS — G894 Chronic pain syndrome: Secondary | ICD-10-CM | POA: Diagnosis not present

## 2020-08-12 DIAGNOSIS — L304 Erythema intertrigo: Secondary | ICD-10-CM | POA: Diagnosis not present

## 2020-08-12 DIAGNOSIS — F339 Major depressive disorder, recurrent, unspecified: Secondary | ICD-10-CM | POA: Diagnosis not present

## 2020-08-12 DIAGNOSIS — R809 Proteinuria, unspecified: Secondary | ICD-10-CM

## 2020-08-12 LAB — TSH: TSH: 0.92 mIU/L (ref 0.40–4.50)

## 2020-08-12 MED ORDER — KETOCONAZOLE 2 % EX CREA: TOPICAL_CREAM | Freq: Two times a day (BID) | CUTANEOUS | 1 refills | Status: AC

## 2020-08-12 MED ORDER — DULOXETINE HCL 60 MG PO CPEP: 60.0000 mg | ORAL_CAPSULE | Freq: Every day | ORAL | 0 refills | Status: DC

## 2020-08-13 ENCOUNTER — Other Ambulatory Visit: Payer: Self-pay | Admitting: Family Medicine

## 2020-08-13 DIAGNOSIS — E039 Hypothyroidism, unspecified: Secondary | ICD-10-CM

## 2020-08-13 DIAGNOSIS — M545 Low back pain, unspecified: Secondary | ICD-10-CM | POA: Diagnosis not present

## 2020-08-13 DIAGNOSIS — Z794 Long term (current) use of insulin: Secondary | ICD-10-CM | POA: Diagnosis not present

## 2020-08-13 DIAGNOSIS — G8929 Other chronic pain: Secondary | ICD-10-CM | POA: Diagnosis not present

## 2020-08-13 DIAGNOSIS — Z853 Personal history of malignant neoplasm of breast: Secondary | ICD-10-CM | POA: Diagnosis not present

## 2020-08-13 DIAGNOSIS — F339 Major depressive disorder, recurrent, unspecified: Secondary | ICD-10-CM | POA: Diagnosis not present

## 2020-08-13 DIAGNOSIS — Z7982 Long term (current) use of aspirin: Secondary | ICD-10-CM | POA: Diagnosis not present

## 2020-08-13 DIAGNOSIS — I10 Essential (primary) hypertension: Secondary | ICD-10-CM | POA: Diagnosis not present

## 2020-08-13 DIAGNOSIS — E785 Hyperlipidemia, unspecified: Secondary | ICD-10-CM | POA: Diagnosis not present

## 2020-08-13 DIAGNOSIS — M170 Bilateral primary osteoarthritis of knee: Secondary | ICD-10-CM | POA: Diagnosis not present

## 2020-08-13 DIAGNOSIS — G2581 Restless legs syndrome: Secondary | ICD-10-CM | POA: Diagnosis not present

## 2020-08-13 DIAGNOSIS — E1129 Type 2 diabetes mellitus with other diabetic kidney complication: Secondary | ICD-10-CM | POA: Diagnosis not present

## 2020-08-13 DIAGNOSIS — L03116 Cellulitis of left lower limb: Secondary | ICD-10-CM | POA: Diagnosis not present

## 2020-08-13 DIAGNOSIS — M47816 Spondylosis without myelopathy or radiculopathy, lumbar region: Secondary | ICD-10-CM | POA: Diagnosis not present

## 2020-08-13 DIAGNOSIS — M810 Age-related osteoporosis without current pathological fracture: Secondary | ICD-10-CM | POA: Diagnosis not present

## 2020-08-13 DIAGNOSIS — S70361D Insect bite (nonvenomous), right thigh, subsequent encounter: Secondary | ICD-10-CM | POA: Diagnosis not present

## 2020-08-13 MED ORDER — LEVOTHYROXINE SODIUM 112 MCG PO TABS: 112.0000 ug | ORAL_TABLET | Freq: Every day | ORAL | 1 refills | Status: DC

## 2020-08-26 DIAGNOSIS — Z853 Personal history of malignant neoplasm of breast: Secondary | ICD-10-CM | POA: Diagnosis not present

## 2020-08-26 DIAGNOSIS — M170 Bilateral primary osteoarthritis of knee: Secondary | ICD-10-CM | POA: Diagnosis not present

## 2020-08-26 DIAGNOSIS — E785 Hyperlipidemia, unspecified: Secondary | ICD-10-CM | POA: Diagnosis not present

## 2020-08-26 DIAGNOSIS — M810 Age-related osteoporosis without current pathological fracture: Secondary | ICD-10-CM | POA: Diagnosis not present

## 2020-08-26 DIAGNOSIS — G2581 Restless legs syndrome: Secondary | ICD-10-CM | POA: Diagnosis not present

## 2020-08-26 DIAGNOSIS — F339 Major depressive disorder, recurrent, unspecified: Secondary | ICD-10-CM | POA: Diagnosis not present

## 2020-08-26 DIAGNOSIS — L03116 Cellulitis of left lower limb: Secondary | ICD-10-CM | POA: Diagnosis not present

## 2020-08-26 DIAGNOSIS — M545 Low back pain, unspecified: Secondary | ICD-10-CM | POA: Diagnosis not present

## 2020-08-26 DIAGNOSIS — S70361D Insect bite (nonvenomous), right thigh, subsequent encounter: Secondary | ICD-10-CM | POA: Diagnosis not present

## 2020-08-26 DIAGNOSIS — E1129 Type 2 diabetes mellitus with other diabetic kidney complication: Secondary | ICD-10-CM | POA: Diagnosis not present

## 2020-08-26 DIAGNOSIS — Z794 Long term (current) use of insulin: Secondary | ICD-10-CM | POA: Diagnosis not present

## 2020-08-26 DIAGNOSIS — G8929 Other chronic pain: Secondary | ICD-10-CM | POA: Diagnosis not present

## 2020-08-26 DIAGNOSIS — I10 Essential (primary) hypertension: Secondary | ICD-10-CM | POA: Diagnosis not present

## 2020-08-26 DIAGNOSIS — M47816 Spondylosis without myelopathy or radiculopathy, lumbar region: Secondary | ICD-10-CM | POA: Diagnosis not present

## 2020-08-26 DIAGNOSIS — Z7982 Long term (current) use of aspirin: Secondary | ICD-10-CM | POA: Diagnosis not present

## 2020-08-26 DIAGNOSIS — E039 Hypothyroidism, unspecified: Secondary | ICD-10-CM | POA: Diagnosis not present

## 2020-08-29 ENCOUNTER — Other Ambulatory Visit: Payer: Self-pay | Admitting: Family Medicine

## 2020-08-29 DIAGNOSIS — IMO0002 Reserved for concepts with insufficient information to code with codable children: Secondary | ICD-10-CM

## 2020-08-29 DIAGNOSIS — E1129 Type 2 diabetes mellitus with other diabetic kidney complication: Secondary | ICD-10-CM

## 2020-09-03 ENCOUNTER — Telehealth: Payer: Self-pay

## 2020-09-03 NOTE — Chronic Care Management (AMB) (Addendum)
Chronic Care Management Pharmacy Assistant   Name: Lindsey [name]  MRN: [id number] DOB: [birth year]  Reason for Encounter: Diabetes Adherence Call  Patient Questions:  1.  Have you seen any other providers since your last visit? Yes, 08/12/20-Sowles, Drue Stager, MD (OV). 07/16/20-Dalldorf, Peter (OV). 07/03/20-Hendrickson, Carola Frost, MD (OV).      2.  Any changes in your medicines or health? Yes, 08/12/20-DULoxetine HCI 60 mg (increased). 07/03/20-Cephalexin 500 mg 3 times daily (added).    PCP : [name], MD  Allergies:  No Known Allergies  Medications: Outpatient Encounter Medications as of 09/03/2020  Medication Sig   TRESIBA FLEXTOUCH 100 UNIT/ML FlexTouch Pen INJECT 10 UNITS INTO THE SKIN DAILY   acetaminophen (TYLENOL) 325 MG tablet Take 2 tablets (650 mg total) by mouth every 6 (six) hours.   alendronate (FOSAMAX) 70 MG tablet Take 1 tablet (70 mg total) by mouth once a week.   aspirin 81 MG tablet Take by mouth.   atorvastatin (LIPITOR) 40 MG tablet Take 1 tablet (40 mg total) by mouth daily.   B-D UF III MINI PEN NEEDLES 31G X 5 MM MISC USE AS DIRECTED BY DOCTOR, USE TO INJECT INSULIN   blood glucose meter kit and supplies Dispense based on patient and insurance preference. Use up to four times daily as directed. (FOR ICD-10 E10.9, E11.9).   calcium-vitamin D (OSCAL WITH D) 500-200 MG-UNIT tablet Take 1 tablet by mouth daily. 669m calcium 800 units Vitamin D   cholecalciferol (VITAMIN D) 1000 UNITS tablet Take by mouth.   DULoxetine (CYMBALTA) 60 MG capsule Take 1 capsule (60 mg total) by mouth daily.   ketoconazole (NIZORAL) 2 % cream Apply topically 2 (two) times daily.   Lancets (ONETOUCH DELICA PLUS [id number] MISC USE TO TEST UP TO FOUR TIMES A DAY AS DIRECTED   letrozole (FEMARA) 2.5 MG tablet Take 2.5 mg by mouth daily.   levothyroxine (SYNTHROID) 112 MCG tablet Take 1 tablet (112 mcg total) by mouth daily. And half on Sundays   lisinopril (ZESTRIL) 40 MG tablet  Take 1 tablet (40 mg total) by mouth at bedtime.   ONETOUCH ULTRA test strip USE TO TEST UP TO FOUR TIMES A DAY   pregabalin (LYRICA) 75 MG capsule Take 1 capsule (75 mg total) by mouth 3 (three) times daily.   vitamin B-12 (CYANOCOBALAMIN) 1000 MCG tablet Take 1,000 mcg by mouth every morning.   No facility-administered encounter medications on file as of 09/03/2020.    Current Diagnosis: Patient Active Problem List   Diagnosis Date Noted   History of breast cancer 06/28/2018   Bilateral lower extremity edema 01/16/2016   Breast cancer, left breast ([facility] 11/05/2015   Osteoporosis, 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   Morbid (severe) obesity due to excess calories ([facility] 08/26/2015   Controlled type 2 diabetes mellitus with microalbuminuria ([facility] 04/25/2015   Hyperlipidemia 04/25/2015   Benign hypertension 04/25/2015   Adult hypothyroidism 04/25/2015   At risk for falling 04/25/2015   Major depression ([facility] 04/25/2015   Post menopausal syndrome 04/25/2015   Recent Relevant Labs: Lab Results  Component Value Date/Time   HGBA1C 6.7 (A) 06/23/2020 11:05 AM   HGBA1C 11.0 (A) 03/31/2020 11:57 AM   HGBA1C 11.8 (H) 12/19/2019 11:34 AM   HGBA1C 7.8 (H) 11/27/2018 11:21 AM   MICROALBUR 0.7 12/26/2019 11:33 AM   MICROALBUR 1.1 08/25/2017 12:01 PM   MICROALBUR NEG 07/01/2016 10:10 AM    Kidney Function Lab Results  Component Value Date/Time   CREATININE 0.92 (H)  03/31/2020 12:52 PM   CREATININE 1.01 (H) 12/19/2019 11:34 AM   GFRNONAA 53 (L) 12/19/2019 11:34 AM   GFRAA 61 12/19/2019 11:34 AM    Current antihyperglycemic regimen:  [name] 10 units at bedtime  What recent interventions/DTPs have been made to improve glycemic control:  Recent interventions was to check blood sugar once daily, document, and provide at future appointments, Contact provider with any episodes of hypoglycemia. Patient reports she is taking insulin as directed.   Have there been any recent hospitalizations or ED  visits since last visit with CPP? No   Patient denies hypoglycemic symptoms, including Pale, Sweaty, Shaky, Hungry, Nervous/irritable and Vision changes   Patient denies hyperglycemic symptoms, including blurry vision, excessive thirst, fatigue, polyuria and weakness   How often are you checking your blood sugar? once daily   What are your blood sugars ranging?  Fasting: none Before meals: 300 at 6 pm; 09/03/20. After meals: none Bedtime: none  During the week, how often does your blood glucose drop below 70? Never   Are you checking your feet daily/regularly? Patient states she checks her feet daily with no issues to report.  Adherence Review: Is the patient currently on a STATIN medication? Yes Is the patient currently on ACE/ARB medication? Yes Does the patient have >5 day gap between last estimated fill dates? No    Goals Addressed             This Visit's Progress    Chronic Care Management   Not on track    CARE PLAN ENTRY  Current Barriers:  Chronic Disease Management support, education, and care coordination needs related to Hypertension, Hyperlipidemia, Diabetes, and Osteoporosis   Hypertension BP Readings from Last 3 Encounters:  12/19/19 124/70  06/29/19 130/80  05/30/19 120/80   Pharmacist Clinical Goal(s): Over the next 90 days, patient will work with PharmD and providers to maintain BP goal <130/80 Current regimen:  Lisinopril 62m daily Interventions: None Patient self care activities - Over the next 90 days, patient will: Check BP weekly, document, and provide at future appointments Ensure daily salt intake < 2300 mg/day  Hyperlipidemia Lab Results  Component Value Date/Time   LDLCALC 86 12/19/2019 11:34 AM   Pharmacist Clinical Goal(s): Over the next 90 days, patient will work with PharmD and providers to maintain LDL goal < 100 (age > 737 Current regimen:  Lipitor 455mdaily Interventions: None Patient self care activities - Over the  next 90 days, patient will: Continue taking atorvastatin 4040maily  Diabetes Lab Results  Component Value Date/Time   HGBA1C 11.8 (H) 12/19/2019 11:34 AM   HGBA1C 7.3 (A) 05/30/2019 11:25 AM   HGBA1C 7.8 (H) 11/27/2018 11:21 AM   Pharmacist Clinical Goal(s): Over the next 90 days, patient will work with PharmD and providers to achieve A1c goal <7% Current regimen:  Tresiba 10 units at bedtime Interventions: Recommend probably [place] and [place] after next A1c Patient self care activities - Over the next 90 days, patient will: Check blood sugar once daily, document, and provide at future appointments Contact provider with any episodes of hypoglycemia  Osteoporosis Pharmacist Clinical Goal(s) Over the next 90 days, patient will work with PharmD and providers to promote bone health and reduce bone pain Current regimen:  Fosamax 18m75mekly Vitamin D3 1000 iu daily Calcium 600mg50m iu daily Tylenol 650mg 1meeded for bone pain Interventions: Take Tylenol three times daily every day Patient self care activities - Over the next 90 days, patient will: Increase dairy consumption  Medication management Pharmacist Clinical Goal(s): Over the next 90 days, patient will work with PharmD and providers to maintain optimal medication adherence Current pharmacy: [facility] Interventions Comprehensive medication review performed. Continue current medication management strategy Patient self care activities - Over the next 90 days, patient will: Focus on medication adherence by taking Janumet with food Take medications as prescribed Report any questions or concerns to PharmD and/or provider(s)  Initial goal documentation         Follow-Up:  Pharmacist Review- Called and spoke with the patient to discuss her blood sugar monitoring. Per patient; her blood sugar has been running high of 300 and sometimes over because she just started taking an antibiotic(penicillin) on 09/01/20 for 21 days  prescribed by her dentist for an abscess in her mouth. Patient stated her jaw was swollen and was told by the dentist it was not a good idea to extract the tooth because she is on Fosamax. Patient also mentioned she was told by her dentist that this antibiotic would raise her blood sugar. Asked the patient if she has reached out to her PCP; patient stated no she has not.  Notified [name], CPP.  Raynelle Highland, [place] Pharmacist Assistant [phone]  Addendum 09/05/20:  Asked Raynelle Highland to ensure patient is taking Penicillin VK four times daily for 7 days as prescribed by her dentist. Recommend increasing Tresiba to 15 units daily while continuing on antibiotic. Will plan for CMA Assessment in one week after patient has finished her course of antibiotic and patient has CPP follow-up on 09/18/20.  25 minutes spent in review, coordination, and documentation. [facility] [phone]

## 2020-09-05 ENCOUNTER — Telehealth: Payer: Self-pay

## 2020-09-05 NOTE — Chronic Care Management (AMB) (Signed)
° ° °  Chronic Care Management °Pharmacy Assistant  ° °Name: Lindsey [name]  MRN: [id number] DOB: 05/30/1941 ° °Reason for Encounter: Medication Review ° ° °PCP : Sowles, Krichna, MD ° °Allergies:  No Known Allergies ° °Medications: °Outpatient Encounter Medications as of 09/05/2020  °Medication Sig Note  °• TRESIBA FLEXTOUCH 100 UNIT/ML FlexTouch Pen INJECT 10 UNITS INTO THE SKIN DAILY   °• acetaminophen (TYLENOL) 325 MG tablet Take 2 tablets (650 mg total) by mouth every 6 (six) hours.   °• alendronate (FOSAMAX) 70 MG tablet Take 1 tablet (70 mg total) by mouth once a week.   °• aspirin 81 MG tablet Take by mouth.   °• atorvastatin (LIPITOR) 40 MG tablet Take 1 tablet (40 mg total) by mouth daily.   °• B-D UF III MINI PEN NEEDLES 31G X 5 MM MISC USE AS DIRECTED BY DOCTOR, USE TO INJECT INSULIN   °• blood glucose meter kit and supplies Dispense based on patient and insurance preference. Use up to four times daily as directed. (FOR ICD-10 E10.9, E11.9).   °• calcium-vitamin D (OSCAL WITH D) 500-200 MG-UNIT tablet Take 1 tablet by mouth daily. 600mg calcium 800 units Vitamin D   °• cholecalciferol (VITAMIN D) 1000 UNITS tablet Take by mouth.   °• DULoxetine (CYMBALTA) 60 MG capsule Take 1 capsule (60 mg total) by mouth daily.   °• ketoconazole (NIZORAL) 2 % cream Apply topically 2 (two) times daily.   °• Lancets (ONETOUCH DELICA PLUS [id number]) MISC USE TO TEST UP TO FOUR TIMES A DAY AS DIRECTED   °• letrozole (FEMARA) 2.5 MG tablet Take 2.5 mg by mouth daily.   °• levothyroxine (SYNTHROID) 112 MCG tablet Take 1 tablet (112 mcg total) by mouth daily. And half on Sundays   °• lisinopril (ZESTRIL) 40 MG tablet Take 1 tablet (40 mg total) by mouth at bedtime.   °• ONETOUCH ULTRA test strip USE TO TEST UP TO FOUR TIMES A DAY   °• penicillin v potassium (VEETID) 500 MG tablet Take 500 mg by mouth every 6 (six) hours. 09/05/2020: Started on 09/01/20 for 7 day supply  °• pregabalin (LYRICA) 75 MG capsule Take 1 capsule (75 mg total)  by mouth 3 (three) times daily.   °• vitamin B-12 (CYANOCOBALAMIN) 1000 MCG tablet Take 1,000 mcg by mouth every morning.   ° °No facility-administered encounter medications on file as of 09/05/2020.  ° ° °Current Diagnosis: °Patient Active Problem List  ° Diagnosis Date Noted  °• History of breast cancer 06/28/2018  °• Bilateral lower extremity edema 01/16/2016  °• Breast cancer, left breast ([facility]) 11/05/2015  °• Osteoporosis, 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  °• Morbid (severe) obesity due to excess calories ([facility]) 08/26/2015  °• Controlled type 2 diabetes mellitus with microalbuminuria ([facility]) 04/25/2015  °• Hyperlipidemia 04/25/2015  °• Benign hypertension 04/25/2015  °• Adult hypothyroidism 04/25/2015  °• At risk for falling 04/25/2015  °• Major depression ([facility]) 04/25/2015  °• Post menopausal syndrome 04/25/2015  ° ° ° °Follow-Up:  Pharmacist Review-Attempted to call the patient to verify and confirm her antibiotic(Penicillin) dosing directions. Per patient's husband; patient is unavailable and will have the patient return my phone call. ° °Notified Alex Fleury, CPP. ° °Candice Thomas, CMA °Clinical Pharmacist Assistant °[phone] ° °

## 2020-09-10 DIAGNOSIS — G2581 Restless legs syndrome: Secondary | ICD-10-CM | POA: Diagnosis not present

## 2020-09-10 DIAGNOSIS — L03116 Cellulitis of left lower limb: Secondary | ICD-10-CM | POA: Diagnosis not present

## 2020-09-10 DIAGNOSIS — Z853 Personal history of malignant neoplasm of breast: Secondary | ICD-10-CM | POA: Diagnosis not present

## 2020-09-10 DIAGNOSIS — F339 Major depressive disorder, recurrent, unspecified: Secondary | ICD-10-CM | POA: Diagnosis not present

## 2020-09-10 DIAGNOSIS — S70361D Insect bite (nonvenomous), right thigh, subsequent encounter: Secondary | ICD-10-CM | POA: Diagnosis not present

## 2020-09-10 DIAGNOSIS — Z794 Long term (current) use of insulin: Secondary | ICD-10-CM | POA: Diagnosis not present

## 2020-09-10 DIAGNOSIS — M47816 Spondylosis without myelopathy or radiculopathy, lumbar region: Secondary | ICD-10-CM | POA: Diagnosis not present

## 2020-09-10 DIAGNOSIS — Z7982 Long term (current) use of aspirin: Secondary | ICD-10-CM | POA: Diagnosis not present

## 2020-09-10 DIAGNOSIS — E039 Hypothyroidism, unspecified: Secondary | ICD-10-CM | POA: Diagnosis not present

## 2020-09-10 DIAGNOSIS — E785 Hyperlipidemia, unspecified: Secondary | ICD-10-CM | POA: Diagnosis not present

## 2020-09-10 DIAGNOSIS — M170 Bilateral primary osteoarthritis of knee: Secondary | ICD-10-CM | POA: Diagnosis not present

## 2020-09-10 DIAGNOSIS — I10 Essential (primary) hypertension: Secondary | ICD-10-CM | POA: Diagnosis not present

## 2020-09-10 DIAGNOSIS — M810 Age-related osteoporosis without current pathological fracture: Secondary | ICD-10-CM | POA: Diagnosis not present

## 2020-09-10 DIAGNOSIS — G8929 Other chronic pain: Secondary | ICD-10-CM | POA: Diagnosis not present

## 2020-09-10 DIAGNOSIS — M545 Low back pain, unspecified: Secondary | ICD-10-CM | POA: Diagnosis not present

## 2020-09-10 DIAGNOSIS — E1129 Type 2 diabetes mellitus with other diabetic kidney complication: Secondary | ICD-10-CM | POA: Diagnosis not present

## 2020-09-12 ENCOUNTER — Emergency Department (HOSPITAL_COMMUNITY): Payer: PPO

## 2020-09-12 ENCOUNTER — Emergency Department (HOSPITAL_COMMUNITY)
Admission: EM | Admit: 2020-09-12 | Discharge: 2020-09-12 | Disposition: A | Discharge status: Home / Self Care | Payer: PPO | Attending: Emergency Medicine | Admitting: Emergency Medicine

## 2020-09-12 ENCOUNTER — Emergency Department (HOSPITAL_BASED_OUTPATIENT_CLINIC_OR_DEPARTMENT_OTHER): Payer: PPO

## 2020-09-12 ENCOUNTER — Other Ambulatory Visit: Payer: Self-pay

## 2020-09-12 ENCOUNTER — Encounter (HOSPITAL_COMMUNITY): Payer: Self-pay

## 2020-09-12 DIAGNOSIS — Z7982 Long term (current) use of aspirin: Secondary | ICD-10-CM | POA: Diagnosis not present

## 2020-09-12 DIAGNOSIS — M7989 Other specified soft tissue disorders: Secondary | ICD-10-CM | POA: Diagnosis present

## 2020-09-12 DIAGNOSIS — M25461 Effusion, right knee: Secondary | ICD-10-CM | POA: Insufficient documentation

## 2020-09-12 DIAGNOSIS — Z853 Personal history of malignant neoplasm of breast: Secondary | ICD-10-CM | POA: Insufficient documentation

## 2020-09-12 DIAGNOSIS — I10 Essential (primary) hypertension: Secondary | ICD-10-CM | POA: Insufficient documentation

## 2020-09-12 DIAGNOSIS — S82141A Displaced bicondylar fracture of right tibia, initial encounter for closed fracture: Secondary | ICD-10-CM | POA: Diagnosis not present

## 2020-09-12 DIAGNOSIS — M79661 Pain in right lower leg: Secondary | ICD-10-CM | POA: Insufficient documentation

## 2020-09-12 DIAGNOSIS — E039 Hypothyroidism, unspecified: Secondary | ICD-10-CM | POA: Insufficient documentation

## 2020-09-12 DIAGNOSIS — E119 Type 2 diabetes mellitus without complications: Secondary | ICD-10-CM | POA: Diagnosis not present

## 2020-09-12 DIAGNOSIS — R600 Localized edema: Secondary | ICD-10-CM | POA: Diagnosis not present

## 2020-09-12 DIAGNOSIS — R609 Edema, unspecified: Secondary | ICD-10-CM | POA: Diagnosis not present

## 2020-09-12 DIAGNOSIS — Z79899 Other long term (current) drug therapy: Secondary | ICD-10-CM | POA: Diagnosis not present

## 2020-09-12 LAB — CBC WITH DIFFERENTIAL/PLATELET
Abs Immature Granulocytes: 0.01 10*3/uL (ref 0.00–0.07)
Basophils Absolute: 0 10*3/uL (ref 0.0–0.1)
Basophils Relative: 1 %
Eosinophils Absolute: 0 10*3/uL (ref 0.0–0.5)
Eosinophils Relative: 0 %
HCT: 39.4 % (ref 36.0–46.0)
Hemoglobin: 12.4 g/dL (ref 12.0–15.0)
Immature Granulocytes: 0 %
Lymphocytes Relative: 20 %
Lymphs Abs: 0.6 10*3/uL — ABNORMAL LOW (ref 0.7–4.0)
MCH: 27.4 pg (ref 26.0–34.0)
MCHC: 31.5 g/dL (ref 30.0–36.0)
MCV: 87 fL (ref 80.0–100.0)
Monocytes Absolute: 0.4 10*3/uL (ref 0.1–1.0)
Monocytes Relative: 15 %
Neutro Abs: 1.9 10*3/uL (ref 1.7–7.7)
Neutrophils Relative %: 64 %
Platelets: 150 10*3/uL (ref 150–400)
RBC: 4.53 MIL/uL (ref 3.87–5.11)
RDW: 14.7 % (ref 11.5–15.5)
WBC: 2.9 10*3/uL — ABNORMAL LOW (ref 4.0–10.5)
nRBC: 0 % (ref 0.0–0.2)

## 2020-09-12 LAB — BASIC METABOLIC PANEL
Anion gap: 10 (ref 5–15)
BUN: 18 mg/dL (ref 8–23)
CO2: 27 mmol/L (ref 22–32)
Calcium: 8.9 mg/dL (ref 8.9–10.3)
Chloride: 100 mmol/L (ref 98–111)
Creatinine, Ser: 0.77 mg/dL (ref 0.44–1.00)
GFR, Estimated: >60 mL/min (ref >60)
Glucose, Bld: 244 mg/dL — ABNORMAL HIGH (ref 70–99)
Potassium: 4 mmol/L (ref 3.5–5.1)
Sodium: 137 mmol/L (ref 135–145)

## 2020-09-12 MED ORDER — MORPHINE SULFATE (PF) 4 MG/ML IV SOLN
4.0000 mg | Freq: Once | INTRAVENOUS | Status: AC
  Administered 2020-09-12: 4 mg via INTRAVENOUS
  Filled 2020-09-12 (×2): qty 1

## 2020-09-12 NOTE — ED Notes (Signed)
RN made aware of BP 

## 2020-09-12 NOTE — Progress Notes (Signed)
Lower extremity venous has been completed.   Preliminary results in CV Proc.   Abram Sander 09/12/2020 5:50 PM

## 2020-09-12 NOTE — Discharge Instructions (Signed)
You do not have a tibial plateau fracture on your CT scan   Take vicodin as needed as prescribed by your orthopedic doctor   Use wheelchair and your knee immobilizer for comfort   See your orthopedic doctor for follow up   Home health will follow up with you   Return to ER if you have worse knee pain and swelling, unable to walk

## 2020-09-12 NOTE — ED Triage Notes (Signed)
Patient c/o right leg pain and patient's daughter-in-law took her to an orthopedic doctor today. Patient has a tibia plateau fracture of the right leg. Patient did not have any injury or trauma. Patient was sent to the ED today to r/o DVT. patieht has swelling to the right leg.

## 2020-09-12 NOTE — ED Provider Notes (Signed)
[facility] DEPT Provider Note   CSN: [id number] Arrival date & time: 09/12/20  1700     History Chief Complaint  Patient presents with   tibia plateau fracture   Leg Swelling    Lindsey [name] is a 80 y.o. female hx of DM, HTN, osteoporosis here with R leg swelling, tibial plateau fracture.  Patient has been having right knee pain for the last 3 to 4 days.  Patient has received knee injections previously and she just needs another injection.  Patient apparently was on the commode today and unable to get up.  Patient has no trauma or fall.  Patient went to orthopedic urgent care and had a x-ray that showed tibial plateau fracture.  Patient is scheduled for outpatient CT and send here for DVT study.  Has no chest pain or shortness of breath.  Patient is not on anticoagulation right now.   The history is provided by the patient.       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   Bursitis    R knee   Closed fracture of left ulna and radius    Complication of anesthesia    Depression    Diabetes mellitus type 2, controlled, without complications ([place]) 09/30/3660   Diabetes mellitus without complication ([place])    GERD (gastroesophageal reflux disease)    TUMS   Hyperlipidemia    Hypertension    Hypothyroidism    Osteoporosis    Osteoporosis, 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   PONV (postoperative nausea and vomiting)    Thyroid disease     Patient Active Problem List   Diagnosis Date Noted   History of breast cancer 06/28/2018   Bilateral lower extremity edema 01/16/2016   Breast cancer, left breast ([place]) 11/05/2015   Osteoporosis, 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   Morbid (severe) obesity due to excess calories ([place]) 08/26/2015   Controlled type  2 diabetes mellitus with microalbuminuria ([place]) 04/25/2015   Hyperlipidemia 04/25/2015   Benign hypertension 04/25/2015   Adult hypothyroidism 04/25/2015   At risk for falling 04/25/2015   Major depression ([place]) 04/25/2015   Post menopausal syndrome 04/25/2015    Past Surgical History:  Procedure Laterality Date   BREAST BIOPSY Left 11-04-15   BREAST BIOPSY Left 10-10-07   CATARACT EXTRACTION Bilateral 1990   CHOLECYSTECTOMY     MASTECTOMY Right 1983   Dr Myrle Sheng   MASTECTOMY Right 1983   MASTECTOMY W/ SENTINEL NODE BIOPSY Left 12/12/2015   Procedure: MASTECTOMY WITH SENTINEL LYMPH NODE BIOPSY;  Surgeon: Christene Lye, MD;  Location: [facility] ORS;  Service: General;  Laterality: Left;   ORIF ULNAR FRACTURE Left 12/11/2018   Procedure: OPEN REDUCTION INTERNAL FIXATION (ORIF) LEFT PROXIMAL ULNA FRACTURE AND  RADIAL HEAD REPLACEMENT;  Surgeon: Milly Jakob, MD;  Location: [place];  Service: Orthopedics;  Laterality: Left;   RADIAL HEAD ARTHROPLASTY Left 12/11/2018   Procedure: OPEN REDUCTION INTERNAL FIXATION (ORIF) LEFT PROXIMAL ULNA FRACTURE AND  RADIAL HEAD REPLACEMENT;  Surgeon: Milly Jakob, MD;  Location: [place];  Service: Orthopedics;  Laterality: Left;     OB History    Gravida  4   Para  2   Term      Preterm  AB  2   Living        SAB  2   IAB      Ectopic      Multiple      Live Births           Obstetric Comments  1st Menstrual Cycle:  11  1st Pregnancy:  45         Family History  Problem Relation Age of Onset   Kidney disease Mother    Heart disease Father    Stroke Sister    Cancer Sister        breast   Breast cancer Sister    Stroke Brother    Diabetes Brother    Hypertension Brother    Cancer Daughter 16       DCIS/lumpectomy/genetic negative   Breast cancer Daughter     Social History   Tobacco Use   Smoking status: Never Smoker   Smokeless  tobacco: Never Used   Tobacco comment: Non-smoker. Smoking cessation materials contraindicated  Vaping Use   Vaping Use: Never used  Substance Use Topics   Alcohol use: No    Alcohol/week: 0.0 standard drinks   Drug use: No    Home Medications Prior to Admission medications   Medication Sig Start Date End Date Taking? Authorizing Provider  TRESIBA FLEXTOUCH 100 UNIT/ML FlexTouch Pen INJECT 10 UNITS INTO THE SKIN DAILY 08/29/20   [name], MD  acetaminophen (TYLENOL) 325 MG tablet Take 2 tablets (650 mg total) by mouth every 6 (six) hours. 12/11/18   Milly Jakob, MD  alendronate (FOSAMAX) 70 MG tablet Take 1 tablet (70 mg total) by mouth once a week. 09/19/19   [name], MD  aspirin 81 MG tablet Take by mouth.    [provider]  atorvastatin (LIPITOR) 40 MG tablet Take 1 tablet (40 mg total) by mouth daily. 03/31/20   [name], MD  B-D UF III MINI PEN NEEDLES 31G X 5 MM MISC USE AS DIRECTED BY DOCTOR, USE TO INJECT INSULIN 06/09/20   [name], MD  blood glucose meter kit and supplies Dispense based on patient and insurance preference. Use up to four times daily as directed. (FOR ICD-10 E10.9, E11.9). 03/31/20   [name], MD  calcium-vitamin D (OSCAL WITH D) 500-200 MG-UNIT tablet Take 1 tablet by mouth daily. 63m calcium 800 units Vitamin D    [provider]  cholecalciferol (VITAMIN D) 1000 UNITS tablet Take by mouth.    [provider]  DULoxetine (CYMBALTA) 60 MG capsule Take 1 capsule (60 mg total) by mouth daily. 08/12/20   SSteele Sizer MD  ketoconazole (NIZORAL) 2 % cream Apply topically 2 (two) times daily. 08/12/20   Sowles, KDrue Stager MD  Lancets (ONETOUCH DELICA PLUS [id number] MISC USE TO TEST UP TO FOUR TIMES A DAY AS DIRECTED 03/31/20   [provider]  letrozole (FEMARA) 2.5 MG tablet Take 2.5 mg by mouth daily. 12/18/19   [provider]  levothyroxine (SYNTHROID) 112 MCG tablet Take 1 tablet  (112 mcg total) by mouth daily. And half on Sundays 08/13/20   SSteele Sizer MD  lisinopril (ZESTRIL) 40 MG tablet Take 1 tablet (40 mg total) by mouth at bedtime. 03/31/20   SSteele Sizer MD  [facility] test strip USE TO TEST UP TO FOUR TIMES A DAY 07/15/20   SSteele Sizer MD  penicillin v potassium (VEETID) 500 MG tablet Take 500 mg by mouth every 6 (six) hours. 09/01/20  [provider]  pregabalin (LYRICA) 75 MG capsule Take 1 capsule (75 mg total) by mouth 3 (three) times daily. 03/31/20   [name], MD  vitamin B-12 (CYANOCOBALAMIN) 1000 MCG tablet Take 1,000 mcg by mouth every morning.    [provider]    Allergies    Patient has no known allergies.  Review of Systems   Review of Systems  Cardiovascular: Positive for leg swelling.  Musculoskeletal:       R knee pain   All other systems reviewed and are negative.   Physical Exam Updated Vital Signs BP (!) 184/83    Pulse 89    Temp 98.2 F (36.8 C) (Oral)    Resp (!) 21    Ht _0  (1.549 m)    Wt 83.9 kg    SpO2 100%    BMI 34.96 kg/m   Physical Exam Vitals and nursing note reviewed.  Constitutional:      Comments: Chronically ill, uncomfortable  HENT:     Head: Normocephalic.     Nose: Nose normal.     Mouth/Throat:     Mouth: Mucous membranes are moist.  Eyes:     Extraocular Movements: Extraocular movements intact.     Pupils: Pupils are equal, round, and reactive to light.  Cardiovascular:     Rate and Rhythm: Normal rate and regular rhythm.     Pulses: Normal pulses.     Heart sounds: Normal heart sounds.  Pulmonary:     Effort: Pulmonary effort is normal.     Breath sounds: Normal breath sounds.  Abdominal:     General: Abdomen is flat.     Palpations: Abdomen is soft.  Musculoskeletal:     Cervical back: Normal range of motion.     Comments: Right knee swollen with no obvious effusion.  Decreased range of motion due to swelling and pain. Her entire right calf and thigh  is also swollen as well.  Patient does have 2+ pulses in the right PT and DP  Skin:    General: Skin is warm.     Capillary Refill: Capillary refill takes less than 2 seconds.  Neurological:     General: No focal deficit present.     Mental Status: She is oriented to person, place, and time.  Psychiatric:        Mood and Affect: Mood normal.        Behavior: Behavior normal.     ED Results / Procedures / Treatments   Labs (all labs ordered are listed, but only abnormal results are displayed) Labs Reviewed  CBC WITH DIFFERENTIAL/PLATELET - Abnormal; Notable for the following components:      Result Value   WBC 2.9 (*)    Lymphs Abs 0.6 (*)    All other components within normal limits  BASIC METABOLIC PANEL - Abnormal; Notable for the following components:   Glucose, Bld 244 (*)    All other components within normal limits    EKG None  Radiology CT Knee Right Wo Contrast  Result Date: 09/12/2020 CLINICAL DATA:  Tibial plateau fracture knee pain EXAM: CT OF THE right KNEE WITHOUT CONTRAST TECHNIQUE: Multidetector CT imaging of the right knee was performed according to the standard protocol. Multiplanar CT image reconstructions were also generated. COMPARISON:  None available FINDINGS: Bones/Joint/Cartilage No definitive fracture is seen. A small knee effusion is present. Mild medial joint space degenerative change. Minimal patellofemoral degenerative change. Ligaments Suboptimally assessed by CT. Muscles and Tendons No  intramuscular fluid collections. Quadriceps and patellar tendons appear grossly intact. Soft tissues Edema within the infrapatellar soft tissues and proximal leg. Varicosities within the superficial subcutaneous fat. IMPRESSION: 1. No definitive fracture is seen. A small knee effusion is present. If further imaging/evaluation is required, MRI could be obtained. 2. Edema within the infrapatellar soft tissues and proximal leg. Electronically Signed   By: Donavan Foil M.D.    On: 09/12/2020 19:27   VAS [place] LOWER EXTREMITY VENOUS (DVT) (ONLY [facility] & [facility])  Result Date: 09/12/2020  Lower Venous DVT Study Indications: Edema.  Comparison Study: no prior Performing Technologist: Abram Sander RVS  Examination Guidelines: A complete evaluation includes B-mode imaging, spectral Doppler, color Doppler, and power Doppler as needed of all accessible portions of each vessel. Bilateral testing is considered an integral part of a complete examination. Limited examinations for reoccurring indications Foister be performed as noted. The reflux portion of the exam is performed with the patient in reverse Trendelenburg.  +---------+---------------+---------+-----------+----------+--------------+  RIGHT     Compressibility Phasicity Spontaneity Properties Thrombus Aging  +---------+---------------+---------+-----------+----------+--------------+  CFV       Full            Yes       Yes                                    +---------+---------------+---------+-----------+----------+--------------+  SFJ       Full                                                             +---------+---------------+---------+-----------+----------+--------------+  FV Prox   Full                                                             +---------+---------------+---------+-----------+----------+--------------+  FV Mid    Full                                                             +---------+---------------+---------+-----------+----------+--------------+  FV Distal Full                                                             +---------+---------------+---------+-----------+----------+--------------+  PFV       Full                                                             +---------+---------------+---------+-----------+----------+--------------+  POP       Full  Yes       Yes                                    +---------+---------------+---------+-----------+----------+--------------+  PTV       Full                                                              +---------+---------------+---------+-----------+----------+--------------+  PERO      Full                                                             +---------+---------------+---------+-----------+----------+--------------+   +----+---------------+---------+-----------+----------+--------------+  LEFT Compressibility Phasicity Spontaneity Properties Thrombus Aging  +----+---------------+---------+-----------+----------+--------------+  CFV  Full            Yes       Yes                                    +----+---------------+---------+-----------+----------+--------------+     Summary: RIGHT: - There is no evidence of deep vein thrombosis in the lower extremity.  - No cystic structure found in the popliteal fossa.  LEFT: - No evidence of common femoral vein obstruction.  *See table(s) above for measurements and observations. Electronically signed by Deitra Mayo MD on 09/12/2020 at 6:20:33 PM.    Final     Procedures Procedures (including critical care time)  Medications Ordered in ED Medications  morphine 4 MG/ML injection 4 mg (4 mg Intravenous Not Given 09/12/20 1802)    ED Course  I have reviewed the triage vital signs and the nursing notes.  Pertinent labs & imaging results that were available during my care of the patient were reviewed by me and considered in my medical decision making (see chart for details).    MDM Rules/Calculators/A&P                         Lindsey [name] is a 80 y.o. female here presenting with right knee swelling and right calf pain.  I am unclear why she has tibial plateau fracture without any significant trauma.  She does have underlying arthritis and [place] just have tibial plateau fracture from overuse.  We will get a CT knee and get labs and DVT study. Patient has no signs of PE  8:34 PM DVT study is negative.  CT did not show any fracture.  She does have a small right knee effusion.  Patient is already  prescribed Vicodin by orthopedic doctor.  I have contacted case management and patient will be receiving a wheelchair and high toilet seat and will get home health.  Patient can follow-up with orthopedic doctor outpatient  Final Clinical Impression(s) / ED Diagnoses Final diagnoses:  None    Rx / DC Orders ED Discharge Orders         Ordered    For home use only DME standard manual wheelchair with seat cushion  Comments: Patient suffers from tibial plateau fracture which impairs their ability to perform daily activities like bathing and dressing in the home.  A walker will not resolve issue with performing activities of daily living. A wheelchair will allow patient to safely perform daily activities. Patient can safely propel the wheelchair in the home or has a caregiver who can provide assistance. Length of need 6 months . Accessories: elevating leg rests (ELRs), wheel locks, extensions and anti-tippers.   09/12/20 [place]        09/12/20 1752    Face-to-face encounter (required for Medicare/Medicaid patients)       Comments: I Wandra Arthurs certify that this patient is under my care and that I, or a nurse practitioner or physician's assistant working with me, had a face-to-face encounter that meets the physician face-to-face encounter requirements with this patient on 09/12/2020. The encounter with the patient was in whole, or in part for the following medical condition(s) which is the primary reason for home health care (List medical condition): tibial plateau fracture   09/12/20 1752    Elevated toilet seat        09/12/20 1805           Drenda Freeze, MD 09/12/20 2037

## 2020-09-15 NOTE — Care Management (Incomplete)
addendum ED CM contacted concerning HH recommendations along with DME. CM reviewed patient's

## 2020-09-17 ENCOUNTER — Telehealth: Payer: Self-pay

## 2020-09-17 NOTE — Progress Notes (Signed)
Spoke to patient to confirmed patient telephone appointment on 09/18/2020 for CCM at 1:00 pm with [name] the Clinical pharmacist.   Patient Verbalized understanding.  [place] Pharmacist Assistant [phone]

## 2020-09-18 ENCOUNTER — Telehealth: Payer: Self-pay

## 2020-09-18 DIAGNOSIS — M1711 Unilateral primary osteoarthritis, right knee: Secondary | ICD-10-CM | POA: Diagnosis not present

## 2020-09-18 NOTE — Chronic Care Management (AMB) (Deleted)
Chronic Care Management Pharmacy  Name: Lindsey [name]  MRN: [id number] DOB: 29-Jan-1941  Chief Complaint/ HPI  Lindsey [name],  80 y.o. , female presents for their Follow-Up CCM visit with the clinical pharmacist via telephone due to COVID-19 Pandemic.  PCP : [name], MD  Their chronic conditions include: HTN, HLD, DM   Hypothyroidism, osteoporosis, depression, history of breast cancer,   Office Visits: 08/12/20: Patient presented to Dr. Ancil Boozer for follow-up. Duloxetine increased to 60 mg daily   Consult Visit: 09/12/20: Patient presented to ED for Knee effusion.   Medications: Outpatient Encounter Medications as of 09/18/2020  Medication Sig Note  . TRESIBA FLEXTOUCH 100 UNIT/ML FlexTouch Pen INJECT 10 UNITS INTO THE SKIN DAILY   . acetaminophen (TYLENOL) 325 MG tablet Take 2 tablets (650 mg total) by mouth every 6 (six) hours.   [name] alendronate (FOSAMAX) 70 MG tablet Take 1 tablet (70 mg total) by mouth once a week.   [name] aspirin 81 MG tablet Take by mouth.   [name] atorvastatin (LIPITOR) 40 MG tablet Take 1 tablet (40 mg total) by mouth daily.   . B-D UF III MINI PEN NEEDLES 31G X 5 MM MISC USE AS DIRECTED BY DOCTOR, USE TO INJECT INSULIN   . blood glucose meter kit and supplies Dispense based on patient and insurance preference. Use up to four times daily as directed. (FOR ICD-10 E10.9, E11.9).   . calcium-vitamin D (OSCAL WITH D) 500-200 MG-UNIT tablet Take 1 tablet by mouth daily. 631m calcium 800 units Vitamin D   . cholecalciferol (VITAMIN D) 1000 UNITS tablet Take by mouth.   . DULoxetine (CYMBALTA) 60 MG capsule Take 1 capsule (60 mg total) by mouth daily.   .Marland Kitchenketoconazole (NIZORAL) 2 % cream Apply topically 2 (two) times daily.   . Lancets (ONETOUCH DELICA PLUS [id number] MISC USE TO TEST UP TO FOUR TIMES A DAY AS DIRECTED   . letrozole (FEMARA) 2.5 MG tablet Take 2.5 mg by mouth daily.   .Marland Kitchenlevothyroxine (SYNTHROID) 112 MCG tablet Take 1 tablet (112 mcg total) by mouth  daily. And half on Sundays   . lisinopril (ZESTRIL) 40 MG tablet Take 1 tablet (40 mg total) by mouth at bedtime.   .Glory RosebushULTRA test strip USE TO TEST UP TO FOUR TIMES A DAY   . penicillin v potassium (VEETID) 500 MG tablet Take 500 mg by mouth every 6 (six) hours. 09/05/2020: Started on 09/01/20 for 7 day supply  . pregabalin (LYRICA) 75 MG capsule Take 1 capsule (75 mg total) by mouth 3 (three) times daily.   . vitamin B-12 (CYANOCOBALAMIN) 1000 MCG tablet Take 1,000 mcg by mouth every morning.    No facility-administered encounter medications on file as of 09/18/2020.   Financial Resource Strain: Low Risk   . Difficulty of Paying Living Expenses: Not hard at all    Current Diagnosis/Assessment:  Goals Addressed   None     Diabetes   Recent Relevant Labs: Lab Results  Component Value Date/Time   HGBA1C 6.7 (A) 06/23/2020 11:05 AM   HGBA1C 11.0 (A) 03/31/2020 11:57 AM   HGBA1C 11.8 (H) 12/19/2019 11:34 AM   HGBA1C 7.8 (H) 11/27/2018 11:21 AM   MICROALBUR 0.7 12/26/2019 11:33 AM   MICROALBUR 1.1 08/25/2017 12:01 PM   MICROALBUR NEG 07/01/2016 10:10 AM     Checking BG: 2x per Day  Recent pre-meal BG readings: 114, rarely 200+ Patient has failed these meds in past: Januvia, metformin Patient is currently  uncontrolled on the following medications:  . [name] 10 units daily   Last diabetic Foot exam: No results found for: HMDIABEYEEXA  Last diabetic Eye exam: No results found for: HMDIABFOOTEX   We discussed:   Plan  Continue current medications  Hyperlipidemia   LDL goal < 100  Lipid Panel     Component Value Date/Time   CHOL 161 12/19/2019 1134   CHOL 151 12/29/2015 0954   TRIG 122 12/19/2019 1134   HDL 53 12/19/2019 1134   HDL 53 12/29/2015 0954   LDLCALC 86 12/19/2019 1134    Hepatic Function Latest Ref Rng & Units 12/19/2019 11/27/2018 03/28/2018  Total Protein 6.1 - 8.1 g/dL 6.8 6.7 6.7  Albumin 3.6 - 5.1 g/dL - - -  AST 10 - 35 U/L _0 ALT 6 -  29 U/L _1 Alk Phosphatase 33 - 130 U/L - - -  Total Bilirubin 0.2 - 1.2 mg/dL 0.8 0.8 0.6     The ASCVD Risk score [name] DC Jr., et al., 2013) failed to calculate for the following reasons:   The 2013 ASCVD risk score is only valid for ages 51 to 28   Patient has failed these meds in past: NA Patient is currently controlled on the following medications:  . Lipitor 29m daily  We discussed:   At goal Denies myalgias Diet improvements  Plan  Continue current medications   Medication Management   We discussed:  Taking Lyrica  Plan  Continue current medication management strategy  Follow up: 3 month phone visit  [phone]

## 2020-09-22 ENCOUNTER — Telehealth: Payer: Self-pay

## 2020-09-22 NOTE — Telephone Encounter (Signed)
Copied from [place] [id number]. Topic: General - Inquiry >> Sep 22, 2020  3:41 PM Greggory Keen D wrote: Reason for CRM: Pt called saying she has been having yellow watery diarrhea for a couple days.  No pain in stomach.  She can eat crackers and bread, grits and be ok but other things she can not tolerate.  CB#  [phone]

## 2020-09-22 NOTE — Telephone Encounter (Signed)
Made patient an virtual appt for Wed but is wants to know if there is anything between now and then she can do.

## 2020-09-24 ENCOUNTER — Encounter (INDEPENDENT_AMBULATORY_CARE_PROVIDER_SITE_OTHER): Payer: PPO | Admitting: Family Medicine

## 2020-09-24 NOTE — Progress Notes (Signed)
No show

## 2020-09-25 DIAGNOSIS — I10 Essential (primary) hypertension: Secondary | ICD-10-CM | POA: Diagnosis not present

## 2020-09-25 DIAGNOSIS — G8929 Other chronic pain: Secondary | ICD-10-CM | POA: Diagnosis not present

## 2020-09-25 DIAGNOSIS — E039 Hypothyroidism, unspecified: Secondary | ICD-10-CM | POA: Diagnosis not present

## 2020-09-25 DIAGNOSIS — M545 Low back pain, unspecified: Secondary | ICD-10-CM | POA: Diagnosis not present

## 2020-09-25 DIAGNOSIS — S70361D Insect bite (nonvenomous), right thigh, subsequent encounter: Secondary | ICD-10-CM | POA: Diagnosis not present

## 2020-09-25 DIAGNOSIS — L03116 Cellulitis of left lower limb: Secondary | ICD-10-CM | POA: Diagnosis not present

## 2020-09-25 DIAGNOSIS — Z794 Long term (current) use of insulin: Secondary | ICD-10-CM | POA: Diagnosis not present

## 2020-09-25 DIAGNOSIS — E785 Hyperlipidemia, unspecified: Secondary | ICD-10-CM | POA: Diagnosis not present

## 2020-09-25 DIAGNOSIS — M810 Age-related osteoporosis without current pathological fracture: Secondary | ICD-10-CM | POA: Diagnosis not present

## 2020-09-25 DIAGNOSIS — Z7982 Long term (current) use of aspirin: Secondary | ICD-10-CM | POA: Diagnosis not present

## 2020-09-25 DIAGNOSIS — F339 Major depressive disorder, recurrent, unspecified: Secondary | ICD-10-CM | POA: Diagnosis not present

## 2020-09-25 DIAGNOSIS — M47816 Spondylosis without myelopathy or radiculopathy, lumbar region: Secondary | ICD-10-CM | POA: Diagnosis not present

## 2020-09-25 DIAGNOSIS — M170 Bilateral primary osteoarthritis of knee: Secondary | ICD-10-CM | POA: Diagnosis not present

## 2020-09-25 DIAGNOSIS — Z853 Personal history of malignant neoplasm of breast: Secondary | ICD-10-CM | POA: Diagnosis not present

## 2020-09-25 DIAGNOSIS — E1129 Type 2 diabetes mellitus with other diabetic kidney complication: Secondary | ICD-10-CM | POA: Diagnosis not present

## 2020-09-25 DIAGNOSIS — G2581 Restless legs syndrome: Secondary | ICD-10-CM | POA: Diagnosis not present

## 2020-10-01 ENCOUNTER — Other Ambulatory Visit: Payer: Self-pay | Admitting: Family Medicine

## 2020-10-01 DIAGNOSIS — M810 Age-related osteoporosis without current pathological fracture: Secondary | ICD-10-CM

## 2020-10-08 DIAGNOSIS — M1711 Unilateral primary osteoarthritis, right knee: Secondary | ICD-10-CM | POA: Diagnosis not present

## 2020-10-15 DIAGNOSIS — M1711 Unilateral primary osteoarthritis, right knee: Secondary | ICD-10-CM | POA: Diagnosis not present

## 2020-10-16 ENCOUNTER — Telehealth: Payer: Self-pay

## 2020-10-16 NOTE — Progress Notes (Signed)
Chronic Care Management Pharmacy Assistant   Name: Lindsey [name]  MRN: [id number] DOB: 01/03/1941  Reason for Encounter:Diabetes Disease State Call.  Patient Questions:  1.  Have you seen any other providers since your last visit? Yes, 09/12/2020 ED Knee Effusion  2.  Any changes in your medicines or health? No   PCP : [name], MD  Allergies:  No Known Allergies  Medications: Outpatient Encounter Medications as of 10/16/2020  Medication Sig Note  . TRESIBA FLEXTOUCH 100 UNIT/ML FlexTouch Pen INJECT 10 UNITS INTO THE SKIN DAILY   . acetaminophen (TYLENOL) 325 MG tablet Take 2 tablets (650 mg total) by mouth every 6 (six) hours.   [name] alendronate (FOSAMAX) 70 MG tablet TAKE 1 TABLET(70 MG) BY MOUTH 1 TIME A WEEK   . aspirin 81 MG tablet Take by mouth.   [name] atorvastatin (LIPITOR) 40 MG tablet Take 1 tablet (40 mg total) by mouth daily.   . B-D UF III MINI PEN NEEDLES 31G X 5 MM MISC USE AS DIRECTED BY DOCTOR, USE TO INJECT INSULIN   . blood glucose meter kit and supplies Dispense based on patient and insurance preference. Use up to four times daily as directed. (FOR ICD-10 E10.9, E11.9).   . calcium-vitamin D (OSCAL WITH D) 500-200 MG-UNIT tablet Take 1 tablet by mouth daily. 61m calcium 800 units Vitamin D   . cholecalciferol (VITAMIN D) 1000 UNITS tablet Take by mouth.   . DULoxetine (CYMBALTA) 60 MG capsule Take 1 capsule (60 mg total) by mouth daily.   .Marland Kitchenketoconazole (NIZORAL) 2 % cream Apply topically 2 (two) times daily.   . Lancets (ONETOUCH DELICA PLUS [id number] MISC USE TO TEST UP TO FOUR TIMES A DAY AS DIRECTED   . letrozole (FEMARA) 2.5 MG tablet Take 2.5 mg by mouth daily.   .Marland Kitchenlevothyroxine (SYNTHROID) 112 MCG tablet Take 1 tablet (112 mcg total) by mouth daily. And half on Sundays   . lisinopril (ZESTRIL) 40 MG tablet Take 1 tablet (40 mg total) by mouth at bedtime.   .Glory RosebushULTRA test strip USE TO TEST UP TO FOUR TIMES A DAY   . penicillin v potassium  (VEETID) 500 MG tablet Take 500 mg by mouth every 6 (six) hours. 09/05/2020: Started on 09/01/20 for 7 day supply  . pregabalin (LYRICA) 75 MG capsule Take 1 capsule (75 mg total) by mouth 3 (three) times daily.   . vitamin B-12 (CYANOCOBALAMIN) 1000 MCG tablet Take 1,000 mcg by mouth every morning.    No facility-administered encounter medications on file as of 10/16/2020.    Current Diagnosis: Patient Active Problem List   Diagnosis Date Noted  . History of breast cancer 06/28/2018  . Bilateral lower extremity edema 01/16/2016  . Breast cancer, left breast ([facility] 11/05/2015  . 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 Morbid (severe) obesity due to excess calories ([place] 08/26/2015  . Controlled type 2 diabetes mellitus with microalbuminuria ([place] 04/25/2015  . Hyperlipidemia 04/25/2015  . Benign hypertension 04/25/2015  . Adult hypothyroidism 04/25/2015  . At risk for falling 04/25/2015  . Major depression ([facility] 04/25/2015  . Post menopausal syndrome 04/25/2015    Goals Addressed   None    Recent Relevant Labs: Lab Results  Component Value Date/Time   HGBA1C 6.7 (A) 06/23/2020 11:05 AM   HGBA1C 11.0 (A) 03/31/2020 11:57 AM   HGBA1C 11.8 (H) 12/19/2019 11:34 AM   HGBA1C 7.8 (H) 11/27/2018 11:21 AM   MICROALBUR 0.7 12/26/2019 11:33 AM  MICROALBUR 1.1 08/25/2017 12:01 PM   MICROALBUR NEG 07/01/2016 10:10 AM    Kidney Function Lab Results  Component Value Date/Time   CREATININE 0.77 09/12/2020 06:02 PM   CREATININE 0.92 (H) 03/31/2020 12:52 PM   CREATININE 1.01 (H) 12/19/2019 11:34 AM   GFRNONAA >60 09/12/2020 06:02 PM   GFRNONAA 53 (L) 12/19/2019 11:34 AM   GFRAA 61 12/19/2019 11:34 AM    . Current antihyperglycemic regimen:  ? Tresiba 10 units at bedtime(Patient states she is not taking at this time due to being in pain all over her body)  . What recent interventions/DTPs have been made to improve glycemic control:  o 09/03/2020 Clinical pharmacist  Recommend  increasing Tresiba to 15 units daily while continuing on antibiotic o Patient states she has stop taking her medications because she is in so much pain. . Have there been any recent hospitalizations or ED visits since last visit with CPP? No . Patient denies hypoglycemic symptoms, including Pale, Sweaty, Shaky, Hungry, Nervous/irritable and Vision changes . Patient denies hyperglycemic symptoms, including blurry vision, excessive thirst, fatigue, polyuria and weakness . How often are you checking your blood sugar? once daily  . Patient states her blood sugar are high ranging around 300. [name] What are your blood sugars ranging?  o Fasting: N/A o Before meals: N/A o After meals: N/A o Bedtime: N/A . During the week, how often does your blood glucose drop below 70? Never . Are you checking your feet daily/regularly?   Patient states she has numbness and pain all over her body.  Patient states she has pain all over especially in her back, hip, and leg.Patient states she is unable to sleep at night due to the pain.Patient reports a 10 out of 10 on the pain scale with 10 being the worse. Patient states it is a aching pain and this has been going on for  3 weeks to 1 month.Patient states she takes tylenol no relief and she is going to go to her pharmacy to pick up a rx from the orthopedics. Patient states due to all the pain she is experiencing she has no motivation to take her medications.Patient is asking is there something else she can do to help with relief.Clinical Pharmacist notified.   Adherence Review: Is the patient currently on a STATIN medication? Yes Is the patient currently on ACE/ARB medication? Yes Does the patient have >5 day gap between last estimated fill dates? Yes   Maryjean Ka  Follow-Up:  Pharmacist Review   Anderson [place] Clinical Pharmacist Assistant [phone]

## 2020-10-21 ENCOUNTER — Telehealth: Payer: Self-pay

## 2020-10-21 NOTE — Telephone Encounter (Signed)
[name] found out that the doctor had prescribed Tramadol and one day she took it with her dinner on Thursday and on Friday morning she took it without and Lindsey [name] is thinking that is why she got sick. Stated pt is telling her one thing and telling other people something different. I did inform Lindsey [name] to let her take 2 Lyrica pills and to keep appt with Ortho for tomorrow. She verbalized understanding.  Copied from [place] [id number]. Topic: General - Other >> Oct 21, 2020  3:23 PM Leward Quan A wrote: Reason for CRM: Patient daughter Lindsey [name] would like Dr Ancil Boozer to know that the pregabalin (LYRICA) 75 MG capsule is not helping. Also states that the pain is from her lower back and her leg. Saying that they will be seeing the Orthopedic Dr on 10/22/20. Ph# [phone] if no answer please leave a message

## 2020-10-22 DIAGNOSIS — M1711 Unilateral primary osteoarthritis, right knee: Secondary | ICD-10-CM | POA: Diagnosis not present

## 2020-10-23 DIAGNOSIS — R197 Diarrhea, unspecified: Secondary | ICD-10-CM | POA: Diagnosis not present

## 2020-10-23 DIAGNOSIS — A09 Infectious gastroenteritis and colitis, unspecified: Secondary | ICD-10-CM | POA: Diagnosis not present

## 2020-10-23 DIAGNOSIS — R634 Abnormal weight loss: Secondary | ICD-10-CM | POA: Diagnosis not present

## 2020-10-23 DIAGNOSIS — R159 Full incontinence of feces: Secondary | ICD-10-CM | POA: Diagnosis not present

## 2020-10-28 DIAGNOSIS — R159 Full incontinence of feces: Secondary | ICD-10-CM | POA: Insufficient documentation

## 2020-10-28 DIAGNOSIS — R197 Diarrhea, unspecified: Secondary | ICD-10-CM | POA: Insufficient documentation

## 2020-10-28 NOTE — Progress Notes (Signed)
Name: Lindsey [name]   MRN: [id number]    DOB: [birth year]   Date:10/29/2020       Progress Note  Subjective  Chief Complaint  Follow Up  I connected with  Lindsey [name] on 10/29/20 at 11:20 AM EST by telephone and verified that I am speaking with the correct person using two identifiers.  I discussed the limitations, risks, security and privacy concerns of performing an evaluation and management service by telephone and the availability of in person appointments. Staff also discussed with the patient that there Jenne be a patient responsible charge related to this service. Patient agreed on having a virtual visit  Patient Location: at home  Provider Location: [facility] Additional Individuals present: husband   HPI   DMII: she used to have microalbuminuria, but has been on ACE and is doing well, last urine micro negative. Also low HDL, on statin therapy. Denies polyphagia, polyuria or polydipsia.She is taking aspirin ( discussed new guidelines -but she wants to continue medication). HgbA1Cwent from 7.1 % to 7.8%to 7.3%and up to 11 % but went down to  6.7%,. She states her daughter-in-law  Checks and over the past 3 days it has been in the 200 range, she states she is in pain   Hypothyroidism: she has been on Synthroid 112 mcg once daily and only half pill on Sunday , last TSH was at goal  . She has mild hair loss, she is always constipated but no change. Also has dry skin that is stable    Back and hip pain/OA knee  seeing Ortho at [place], Dr. Carmon Sails she is having knee injections right knee, but continues to be in pain. She has been taking Tylenol . She states also taking some other medications for pain, but could not tell me the name.   Major Depression: She states that she has been feeling worse not, continues to worry about her husband, she is having increase in back pain also has knee pain and gets discouraged and frustrated that she can not do as much as she used to . We had switched  her from zoloft to duloxetine but she states she is not taking any of them now , worried that it was the cause of diarrhea.   Diarrhea: seeing [place] physicians, she was given probiotics last week, advised her to keep follow up appointment . No blood in stools   Patient Active Problem List   Diagnosis Date Noted  . Diarrhea 10/28/2020  . Incontinence of feces 10/28/2020  . History of breast cancer 06/28/2018  . Bilateral lower extremity edema 01/16/2016  . Breast cancer, left breast ([place]) 11/05/2015  . 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 Morbid (severe) obesity due to excess calories ([place]) 08/26/2015  . Controlled type 2 diabetes mellitus with microalbuminuria ([place]) 04/25/2015  . Hyperlipidemia 04/25/2015  . Benign hypertension 04/25/2015  . Adult hypothyroidism 04/25/2015  . At risk for falling 04/25/2015  . Major depression ([place]) 04/25/2015  . Post menopausal syndrome 04/25/2015    Past Surgical History:  Procedure Laterality Date  . BREAST BIOPSY Left 11-04-15  . BREAST BIOPSY Left 10-10-07  . CATARACT EXTRACTION Bilateral 1990  . CHOLECYSTECTOMY    . MASTECTOMY Right 1983   Dr Myrle Sheng  . MASTECTOMY Right 1983  . MASTECTOMY W/ SENTINEL NODE BIOPSY Left 12/12/2015   Procedure: MASTECTOMY WITH SENTINEL LYMPH NODE BIOPSY;  Surgeon: Christene Lye, MD;  Location: [facility];  Service: General;  Laterality: Left;  . ORIF  ULNAR FRACTURE Left 12/11/2018   Procedure: OPEN REDUCTION INTERNAL FIXATION (ORIF) LEFT PROXIMAL ULNA FRACTURE AND  RADIAL HEAD REPLACEMENT;  Surgeon: Milly Jakob, MD;  Location: [place];  Service: Orthopedics;  Laterality: Left;  . RADIAL HEAD ARTHROPLASTY Left 12/11/2018   Procedure: OPEN REDUCTION INTERNAL FIXATION (ORIF) LEFT PROXIMAL ULNA FRACTURE AND  RADIAL HEAD REPLACEMENT;  Surgeon: Milly Jakob, MD;  Location: [place];  Service: Orthopedics;  Laterality: Left;    Family History  Problem Relation  Age of Onset  . Kidney disease Mother   . Heart disease Father   . Stroke Sister   . Cancer Sister        breast  . Breast cancer Sister   . Stroke Brother   . Diabetes Brother   . Hypertension Brother   . Cancer Daughter 48       DCIS/lumpectomy/genetic negative  . Breast cancer Daughter     Social History   Socioeconomic History  . Marital status: Married    Spouse name: Not on file  . Number of children: 3  . Years of education: Not on file  . Highest education level: Not on file  Occupational History  . Occupation: Retired  Tobacco Use  . Smoking status: Never Smoker  . Smokeless tobacco: Never Used  . Tobacco comment: Non-smoker. Smoking cessation materials contraindicated  Vaping Use  . Vaping Use: Never used  Substance and Sexual Activity  . Alcohol use: No    Alcohol/week: 0.0 standard drinks  . Drug use: No  . Sexual activity: Never  Other Topics Concern  . Not on file  Social History Narrative  . Not on file   Social Determinants of Health   Financial Resource Strain: Low Risk   . Difficulty of Paying Living Expenses: Not hard at all  Food Insecurity: No Food Insecurity  . Worried About Charity fundraiser in the Last Year: Never true  . Ran Out of Food in the Last Year: Never true  Transportation Needs: No Transportation Needs  . Lack of Transportation (Medical): No  . Lack of Transportation (Non-Medical): No  Physical Activity: Inactive  . Days of Exercise per Week: 0 days  . Minutes of Exercise per Session: 0 min  Stress: Stress Concern Present  . Feeling of Stress : Rather much  Social Connections: Moderately Isolated  . Frequency of Communication with Friends and Family: Three times a week  . Frequency of Social Gatherings with Friends and Family: Never  . Attends Religious Services: Never  . Active Member of Clubs or Organizations: No  . Attends Archivist Meetings: Never  . Marital Status: Married  Human resources officer Violence:  Not At Risk  . Fear of Current or Ex-Partner: No  . Emotionally Abused: No  . Physically Abused: No  . Sexually Abused: No     Current Outpatient Medications:  .  acetaminophen (TYLENOL) 325 MG tablet, Take 2 tablets (650 mg total) by mouth every 6 (six) hours., Disp: , Rfl:  .  acetaminophen-codeine (TYLENOL #3) 300-30 MG tablet, Take 1-2 tablets by mouth every 6 (six) hours as needed., Disp: , Rfl:  .  alendronate (FOSAMAX) 70 MG tablet, TAKE 1 TABLET(70 MG) BY MOUTH 1 TIME A WEEK, Disp: 12 tablet, Rfl: 1 .  aspirin 81 MG tablet, Take by mouth., Disp: , Rfl:  .  atorvastatin (LIPITOR) 40 MG tablet, Take 1 tablet (40 mg total) by mouth daily., Disp: 90 tablet, Rfl: 1 .  B-D UF III MINI PEN NEEDLES 31G X 5 MM MISC, USE AS DIRECTED BY DOCTOR, USE TO INJECT INSULIN, Disp: 100 each, Rfl: 3 .  blood glucose meter kit and supplies, Dispense based on patient and insurance preference. Use up to four times daily as directed. (FOR ICD-10 E10.9, E11.9)., Disp: 1 each, Rfl: 0 .  calcium-vitamin D (OSCAL WITH D) 500-200 MG-UNIT tablet, Take 1 tablet by mouth daily. 662m calcium 800 units Vitamin D, Disp: , Rfl:  .  cholecalciferol (VITAMIN D) 1000 UNITS tablet, Take by mouth., Disp: , Rfl:  .  DULoxetine (CYMBALTA) 60 MG capsule, Take 1 capsule (60 mg total) by mouth daily., Disp: 90 capsule, Rfl: 0 .  ketoconazole (NIZORAL) 2 % cream, Apply topically 2 (two) times daily., Disp: 60 g, Rfl: 1 .  Lancets (ONETOUCH DELICA PLUS [id number] MISC, USE TO TEST UP TO FOUR TIMES A DAY AS DIRECTED, Disp: , Rfl:  .  letrozole (FEMARA) 2.5 MG tablet, Take 2.5 mg by mouth daily., Disp: , Rfl:  .  levothyroxine (SYNTHROID) 112 MCG tablet, Take 1 tablet (112 mcg total) by mouth daily. And half on Sundays, Disp: 90 tablet, Rfl: 1 .  lisinopril (ZESTRIL) 40 MG tablet, Take 1 tablet (40 mg total) by mouth at bedtime., Disp: 90 tablet, Rfl: 1 .  metFORMIN (GLUCOPHAGE) 850 MG tablet, 1 tablet with a meal, Disp: , Rfl:  .   naproxen sodium (ALEVE) 220 MG tablet, 1 tablet with food or milk as needed, Disp: , Rfl:  .  ofloxacin (OCUFLOX) 0.3 % ophthalmic solution, 1 drop into affected eye, Disp: , Rfl:  .  ONETOUCH ULTRA test strip, USE TO TEST UP TO FOUR TIMES A DAY, Disp: 300 strip, Rfl: 2 .  pregabalin (LYRICA) 75 MG capsule, Take 1 capsule (75 mg total) by mouth 3 (three) times daily., Disp: 270 capsule, Rfl: 0 .  sertraline (ZOLOFT) 50 MG tablet, 1 tablet, Disp: , Rfl:  .  tiZANidine (ZANAFLEX) 2 MG tablet, 1 tablet as needed, Disp: , Rfl:  .  traMADol-acetaminophen (ULTRACET) 37.5-325 MG tablet, 2 tablets as needed, Disp: , Rfl:  .  traZODone (DESYREL) 50 MG tablet, 1 tablet at bedtime., Disp: , Rfl:  .  TRESIBA FLEXTOUCH 100 UNIT/ML FlexTouch Pen, INJECT 10 UNITS INTO THE SKIN DAILY, Disp: 15 mL, Rfl: 0 .  vitamin B-12 (CYANOCOBALAMIN) 1000 MCG tablet, Take 1,000 mcg by mouth every morning., Disp: , Rfl:  .  penicillin v potassium (VEETID) 500 MG tablet, Take 500 mg by mouth every 6 (six) hours. (Patient not taking: Reported on 10/29/2020), Disp: , Rfl:   No Known Allergies  I personally reviewed active problem list, medication list, allergies, family history, social history, health maintenance with the patient/caregiver today.   ROS  Ten systems reviewed and is negative except as mentioned in HPI   Objective  Virtual encounter, vitals not obtained.  Body mass index is 34.96 kg/m.  Physical Exam  Awake, alert and oriented   PHQ2/9: [facility] 10/29/2020 08/12/2020 07/03/2020 06/23/2020 06/23/2020  Decreased Interest _0 Down, Depressed, Hopeless _1 PHQ - 2 Score _2 Altered sleeping _3 -  Tired, decreased energy _4 -  Change in appetite 2 0 3 3 -  Feeling bad or failure about yourself  0 _5 -  Trouble concentrating 0 0 2 2 -  Moving slowly or  fidgety/restless 0 _0 -  Suicidal thoughts 0 0 2 2 -  PHQ-9 Score _1 -  Difficult doing  work/chores Somewhat difficult Very difficult Somewhat difficult Somewhat difficult -  Some recent data might be hidden   PHQ-2/9 Result is positive.    Fall Risk: Fall Risk  10/29/2020 08/12/2020 07/03/2020 06/23/2020 12/19/2019  Falls in the past year? 0 0 0 0 0  Number falls in past yr: 0 0 0 0 0  Injury with Fall? 0 0 0 0 0  Risk for fall due to : - - - - -  Risk for fall due to: Comment - - - - -  Follow up Falls prevention discussed - - - -    Assessment & Plan  1. Benign hypertension   2. Adult hypothyroidism  Last TSH is at goal   3. Chronic pain syndrome  She is taking something for pain, but not sure of the name  4. Dyslipidemia associated with type 2 diabetes mellitus ([place])  Advised to come to see me in 2 months with all medications and to check A1C  5. Primary osteoarthritis of both knees  Keep follow up with Ortho   6. Moderate episode of recurrent major depressive disorder ([place])  She stopped all medications, she is frustrated about her health and pain *  7. Controlled type 2 diabetes mellitus with microalbuminuria, without long-term current use of insulin ([place])  I discussed the assessment and treatment plan with the patient. The patient was provided an opportunity to ask questions and all were answered. The patient agreed with the plan and demonstrated an understanding of the instructions.   The patient was advised to call back or seek an in-person evaluation if the symptoms worsen or if the condition fails to improve as anticipated.  I provided 25 minutes of non-face-to-face time during this encounter.  [name], MD

## 2020-10-29 ENCOUNTER — Encounter: Payer: Self-pay | Admitting: Family Medicine

## 2020-10-29 ENCOUNTER — Telehealth (INDEPENDENT_AMBULATORY_CARE_PROVIDER_SITE_OTHER): Payer: PPO | Admitting: Family Medicine

## 2020-10-29 VITALS — Ht 61.0 in | Wt 185.0 lb

## 2020-10-29 DIAGNOSIS — E1169 Type 2 diabetes mellitus with other specified complication: Secondary | ICD-10-CM

## 2020-10-29 DIAGNOSIS — F331 Major depressive disorder, recurrent, moderate: Secondary | ICD-10-CM

## 2020-10-29 DIAGNOSIS — R809 Proteinuria, unspecified: Secondary | ICD-10-CM | POA: Diagnosis not present

## 2020-10-29 DIAGNOSIS — I10 Essential (primary) hypertension: Secondary | ICD-10-CM | POA: Diagnosis not present

## 2020-10-29 DIAGNOSIS — G894 Chronic pain syndrome: Secondary | ICD-10-CM | POA: Diagnosis not present

## 2020-10-29 DIAGNOSIS — M170 Bilateral primary osteoarthritis of knee: Secondary | ICD-10-CM | POA: Diagnosis not present

## 2020-10-29 DIAGNOSIS — E039 Hypothyroidism, unspecified: Secondary | ICD-10-CM | POA: Diagnosis not present

## 2020-10-29 DIAGNOSIS — E1129 Type 2 diabetes mellitus with other diabetic kidney complication: Secondary | ICD-10-CM

## 2020-10-29 DIAGNOSIS — E785 Hyperlipidemia, unspecified: Secondary | ICD-10-CM | POA: Diagnosis not present

## 2020-10-30 ENCOUNTER — Other Ambulatory Visit: Payer: Self-pay | Admitting: Emergency Medicine

## 2020-10-30 ENCOUNTER — Telehealth: Payer: Self-pay

## 2020-10-30 NOTE — Telephone Encounter (Signed)
Copied from [place] [id number]. Topic: Quick Sport and exercise psychologist Patient (Clinic Use ONLY) >> Oct 30, 2020  2:11 PM Lennox Solders wrote: Reason for CRM: Pt daughter cindy is returning helen call  maybe concerning medications. Please call pt between 330 and 4 pm today. Pt daughter drives a school bus

## 2020-10-30 NOTE — Telephone Encounter (Signed)
Ask Roselyn Reef about this patient

## 2020-11-03 DIAGNOSIS — A09 Infectious gastroenteritis and colitis, unspecified: Secondary | ICD-10-CM | POA: Diagnosis not present

## 2020-11-06 ENCOUNTER — Other Ambulatory Visit: Payer: Self-pay | Admitting: Family Medicine

## 2020-11-06 DIAGNOSIS — M5416 Radiculopathy, lumbar region: Secondary | ICD-10-CM

## 2020-11-07 ENCOUNTER — Telehealth: Payer: Self-pay | Admitting: Family Medicine

## 2020-11-07 NOTE — Telephone Encounter (Signed)
Medication Refill - Medication: pregabalin (LYRICA) 75 MG capsule  Needs for her back pain  Has the patient contacted their pharmacy? Yes.   (Agent: If no, request that the patient contact the pharmacy for the refill.) (Agent: If yes, when and what did the pharmacy advise?)Call PCP  Preferred Pharmacy (with phone number or street name):  [id number] - [name], [place] - [address] Phone:  [phone]  Fax:  [phone]       Agent: Please be advised that RX refills Lindsey [name] take up to 3 business days. We ask that you follow-up with your pharmacy.

## 2020-11-10 ENCOUNTER — Telehealth: Payer: Self-pay

## 2020-11-10 ENCOUNTER — Other Ambulatory Visit: Payer: Self-pay | Admitting: Family Medicine

## 2020-11-10 NOTE — Telephone Encounter (Signed)
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

## 2020-11-14 ENCOUNTER — Other Ambulatory Visit: Payer: Self-pay | Admitting: Family Medicine

## 2020-11-14 MED ORDER — PREGABALIN 75 MG PO CAPS: 75.0000 mg | ORAL_CAPSULE | Freq: Two times a day (BID) | ORAL | 0 refills | Status: DC

## 2020-11-14 NOTE — Telephone Encounter (Signed)
75mg  capsule 2x daily

## 2020-11-15 DIAGNOSIS — M25551 Pain in right hip: Secondary | ICD-10-CM | POA: Diagnosis not present

## 2020-11-15 DIAGNOSIS — M545 Low back pain, unspecified: Secondary | ICD-10-CM | POA: Diagnosis not present

## 2020-11-15 DIAGNOSIS — M25552 Pain in left hip: Secondary | ICD-10-CM | POA: Diagnosis not present

## 2020-11-15 DIAGNOSIS — M1711 Unilateral primary osteoarthritis, right knee: Secondary | ICD-10-CM | POA: Diagnosis not present

## 2020-11-16 ENCOUNTER — Other Ambulatory Visit: Payer: Self-pay | Admitting: Family Medicine

## 2020-11-16 DIAGNOSIS — F331 Major depressive disorder, recurrent, moderate: Secondary | ICD-10-CM

## 2020-11-16 NOTE — Telephone Encounter (Signed)
Patient no longer on this medication

## 2020-11-18 DIAGNOSIS — R197 Diarrhea, unspecified: Secondary | ICD-10-CM | POA: Diagnosis not present

## 2020-11-19 ENCOUNTER — Other Ambulatory Visit: Payer: Self-pay | Admitting: Orthopaedic Surgery

## 2020-11-19 DIAGNOSIS — M545 Low back pain, unspecified: Secondary | ICD-10-CM

## 2020-11-24 ENCOUNTER — Encounter: Payer: Self-pay | Admitting: Family Medicine

## 2020-11-27 ENCOUNTER — Telehealth: Payer: Self-pay | Admitting: Family Medicine

## 2020-11-27 NOTE — Telephone Encounter (Signed)
Copied from [place] [id number]. Topic: Medicare AWV >> Nov 27, 2020 11:50 AM Cher Nakai R wrote: Reason for CRM:  Left message for patient to call back and schedule Medicare Annual Wellness Visit (AWV) in office.   If unable to come into the office for AWV,  please offer to do virtually or by telephone.  Last AWV:  12/04/2019  Please schedule at anytime with [place].  40 minute appointment  Any questions, please contact me at [phone]

## 2020-12-08 ENCOUNTER — Other Ambulatory Visit: Payer: Self-pay

## 2020-12-08 ENCOUNTER — Ambulatory Visit
Admission: RE | Admit: 2020-12-08 | Discharge: 2020-12-08 | Disposition: A | Discharge status: Home / Self Care | Payer: PPO | Source: Ambulatory Visit | Attending: Orthopaedic Surgery | Admitting: Orthopaedic Surgery

## 2020-12-08 DIAGNOSIS — M545 Low back pain, unspecified: Secondary | ICD-10-CM | POA: Diagnosis not present

## 2020-12-08 DIAGNOSIS — M48061 Spinal stenosis, lumbar region without neurogenic claudication: Secondary | ICD-10-CM | POA: Diagnosis not present

## 2020-12-09 ENCOUNTER — Ambulatory Visit: Payer: PPO

## 2020-12-09 DIAGNOSIS — E113393 Type 2 diabetes mellitus with moderate nonproliferative diabetic retinopathy without macular edema, bilateral: Secondary | ICD-10-CM | POA: Diagnosis not present

## 2020-12-11 ENCOUNTER — Other Ambulatory Visit: Payer: Self-pay | Admitting: Family Medicine

## 2020-12-11 DIAGNOSIS — E1165 Type 2 diabetes mellitus with hyperglycemia: Secondary | ICD-10-CM

## 2020-12-11 DIAGNOSIS — IMO0002 Reserved for concepts with insufficient information to code with codable children: Secondary | ICD-10-CM

## 2020-12-13 DIAGNOSIS — M545 Low back pain, unspecified: Secondary | ICD-10-CM | POA: Diagnosis not present

## 2020-12-15 ENCOUNTER — Other Ambulatory Visit: Payer: Self-pay | Admitting: Orthopaedic Surgery

## 2020-12-15 DIAGNOSIS — M4856XA Collapsed vertebra, not elsewhere classified, lumbar region, initial encounter for fracture: Secondary | ICD-10-CM

## 2020-12-16 ENCOUNTER — Other Ambulatory Visit: Payer: Self-pay | Admitting: Family Medicine

## 2020-12-16 NOTE — Telephone Encounter (Signed)
Last seen virtual 3.18.2022 with Dr Ancil Boozer Next appt 5.17.2022 with Dr Ancil Boozer

## 2020-12-16 NOTE — Telephone Encounter (Signed)
Requested medication (s) are due for refill today: yes  Requested medication (s) are on the active medication list: yes  Last refill:  11/14/20 #60 0 refills  Future visit scheduled: yes in 3 weeks   Notes to clinic:  not delegated per protocol     Requested Prescriptions  Pending Prescriptions Disp Refills   pregabalin (LYRICA) 75 MG capsule [Pharmacy Med Name: PREGABALIN 75MG  CAPSULES] 60 capsule     Sig: TAKE 1 CAPSULE(75 MG) BY MOUTH TWICE DAILY      Not Delegated - Neurology:  Anticonvulsants - Controlled Failed - 12/16/2020 12:08 PM      Failed - This refill cannot be delegated      Passed - Valid encounter within last 12 months    Recent Outpatient Visits           1 month ago Adult hypothyroidism   [facility] [place], Drue Stager, MD   4 months ago Controlled type 2 diabetes mellitus with microalbuminuria, unspecified whether long term insulin use [facility])   [facility] [name], MD   5 months ago Insect bite of right thigh, initial encounter   [facility] Le[facility] Conners D, MD   5 months ago Controlled type 2 diabetes mellitus with microalbuminuria, unspecified whether long term insulin use [facility])   [facility] [place], Drue Stager, MD   8 months ago Uncontrolled type 2 diabetes mellitus with microalbuminuria [facility])   [facility] [name], MD       Future Appointments             In 1 week  [facility] [place]   In 3 weeks [name], MD [place]

## 2020-12-17 ENCOUNTER — Other Ambulatory Visit: Payer: Self-pay | Admitting: Gastroenterology

## 2020-12-17 ENCOUNTER — Encounter: Payer: Self-pay | Admitting: *Deleted

## 2020-12-17 ENCOUNTER — Ambulatory Visit
Admission: RE | Admit: 2020-12-17 | Discharge: 2020-12-17 | Disposition: A | Discharge status: Home / Self Care | Payer: PPO | Source: Ambulatory Visit | Attending: Orthopaedic Surgery | Admitting: Orthopaedic Surgery

## 2020-12-17 DIAGNOSIS — M4856XA Collapsed vertebra, not elsewhere classified, lumbar region, initial encounter for fracture: Secondary | ICD-10-CM

## 2020-12-17 DIAGNOSIS — S32020A Wedge compression fracture of second lumbar vertebra, initial encounter for closed fracture: Secondary | ICD-10-CM | POA: Diagnosis not present

## 2020-12-17 HISTORY — PX: IR RADIOLOGIST EVAL & MGMT: IMG5224

## 2020-12-17 NOTE — Consult Note (Signed)
Chief Complaint: Back pain  Referring Physician(s): Dalldorf,Peter  History of Present Illness: Lindsey [name] is a 80 y.o. female with past medical history significant for breast cancer, depression, diabetes, hypertension, hyperlipidemia, hypothyroidism and osteoporosis who has been referred to the interventional radiology clinic for evaluation for potential percutaneous management of symptomatic L2, L3 and L4 compression fractures demonstrated on lumbar spine MRI performed 12/08/2020.  The patient is accompanied to the interventional radiology clinic by her daughter.  Patient reports a long history of back pain though states this acutely worsened approximately 6 months ago.  She denies an inciting incident such as a fall.  Since that time, her more localized low back pain has progressively worsened.  Currently she rates her back pain as 8-9/10 at rest and 10/10 with activity.    The patient lives with her husband however he is also elderly and requires assistance.  While previously independent in ambulation, she currently requires a rolling walker to ambulate around home and is unable to do much outside of her home given her lifestyle limiting back pain.  Prior to the past 6 months, the patient was independent with all activities of daily living however currently requires assistance for nearly all activities.  Fortunately, the patient's son and daughter live within 2 miles of her residence and are readily available for assistance.  Patient states she has had recent difficulty with urgent bowel movements however feels this Matthies be attributable to medication adjustments.  She denies change and bladder function.  No lower extremity paresthesias.  Past Medical History:  Diagnosis Date  . Anxiety   . Arthritis   . Breast cancer ([place]) 1983   right breast cancer  . 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 Bursitis     R knee  . Closed fracture of left ulna and radius   . Complication of anesthesia   . Depression   . Diabetes mellitus type 2, controlled, without complications ([place]) 02/27/6753  . Diabetes mellitus without complication ([place])   . GERD (gastroesophageal reflux disease)    TUMS  . Hyperlipidemia   . Hypertension   . Hypothyroidism   . Osteoporosis   . 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 PONV (postoperative nausea and vomiting)   . Thyroid disease     Past Surgical History:  Procedure Laterality Date  . BREAST BIOPSY Left 11-04-15  . BREAST BIOPSY Left 10-10-07  . CATARACT EXTRACTION Bilateral 1990  . CHOLECYSTECTOMY    . MASTECTOMY Right 1983   Dr Myrle Sheng  . MASTECTOMY Right 1983  . MASTECTOMY W/ SENTINEL NODE BIOPSY Left 12/12/2015   Procedure: MASTECTOMY WITH SENTINEL LYMPH NODE BIOPSY;  Surgeon: Christene Lye, MD;  Location: [facility] ORS;  Service: General;  Laterality: Left;  . ORIF ULNAR FRACTURE Left 12/11/2018   Procedure: OPEN REDUCTION INTERNAL FIXATION (ORIF) LEFT PROXIMAL ULNA FRACTURE AND  RADIAL HEAD REPLACEMENT;  Surgeon: Milly Jakob, MD;  Location: [place];  Service: Orthopedics;  Laterality: Left;  . RADIAL HEAD ARTHROPLASTY Left 12/11/2018   Procedure: OPEN REDUCTION INTERNAL FIXATION (ORIF) LEFT PROXIMAL ULNA FRACTURE AND  RADIAL HEAD REPLACEMENT;  Surgeon: Milly Jakob, MD;  Location: [place];  Service: Orthopedics;  Laterality: Left;    Allergies: Patient has no known allergies.  Medications: Prior to Admission medications   Medication Sig Start Date End Date Taking? Authorizing Provider  pregabalin (LYRICA) 75 MG  capsule TAKE 1 CAPSULE(75 MG) BY MOUTH TWICE DAILY 12/16/20   [name], MD  acetaminophen (TYLENOL) 325 MG tablet Take 2 tablets (650 mg total) by mouth every 6 (six) hours. 12/11/18   Milly Jakob, MD  acetaminophen-codeine (TYLENOL #3) 300-30 MG tablet Take 1-2 tablets by mouth every 6  (six) hours as needed. 09/12/20   [provider]  aspirin 81 MG tablet Take by mouth.    [provider]  atorvastatin (LIPITOR) 40 MG tablet Take 1 tablet (40 mg total) by mouth daily. 03/31/20   [name], MD  B-D UF III MINI PEN NEEDLES 31G X 5 MM MISC USE AS DIRECTED BY DOCTOR, USE TO INJECT INSULIN 06/09/20   [name], MD  blood glucose meter kit and supplies Dispense based on patient and insurance preference. Use up to four times daily as directed. (FOR ICD-10 E10.9, E11.9). 03/31/20   [name], MD  calcium-vitamin D (OSCAL WITH D) 500-200 MG-UNIT tablet Take 1 tablet by mouth daily. 657m calcium 800 units Vitamin D    [provider]  cholecalciferol (VITAMIN D) 1000 UNITS tablet Take by mouth.    [provider]  ketoconazole (NIZORAL) 2 % cream Apply topically 2 (two) times daily. 08/12/20   Sowles, KDrue Stager MD  Lancets (ONETOUCH DELICA PLUS [id number] MISC USE TO TEST UP TO FOUR TIMES A DAY AS DIRECTED 03/31/20   [provider]  letrozole (FEMARA) 2.5 MG tablet Take 2.5 mg by mouth daily. 12/18/19   [provider]  levothyroxine (SYNTHROID) 112 MCG tablet Take 1 tablet (112 mcg total) by mouth daily. And half on Sundays 08/13/20   SSteele Sizer MD  lisinopril (ZESTRIL) 40 MG tablet Take 1 tablet (40 mg total) by mouth at bedtime. 03/31/20   SSteele Sizer MD  metFORMIN (GLUCOPHAGE) 850 MG tablet 1 tablet with a meal    [provider]  ONETOUCH ULTRA test strip USE TO TEST UP TO FOUR TIMES A DAY 07/15/20   SAncil Boozer KDrue Stager MD  tiZANidine (ZANAFLEX) 2 MG tablet 1 tablet as needed    [provider]  traMADol-acetaminophen (ULTRACET) 37.5-325 MG tablet 2 tablets as needed    [provider]  traZODone (DESYREL) 50 MG tablet 1 tablet at bedtime.    [provider]  TRESIBA FLEXTOUCH 100 UNIT/ML FlexTouch Pen INJECT 10 UNITS INTO THE SKIN ONCE DAILY 12/11/20   SSteele Sizer MD   vitamin B-12 (CYANOCOBALAMIN) 1000 MCG tablet Take 1,000 mcg by mouth every morning.    [provider]     Family History  Problem Relation Age of Onset  . Kidney disease Mother   . Heart disease Father   . Stroke Sister   . Cancer Sister        breast  . Breast cancer Sister   . Stroke Brother   . Diabetes Brother   . Hypertension Brother   . Cancer Daughter [id number]      DCIS/lumpectomy/genetic negative  . Breast cancer Daughter     Social History   Socioeconomic History  . Marital status: Married    Spouse name: Not on file  . Number of children: 3  . Years of education: Not on file  . Highest education level: Not on file  Occupational History  . Occupation: Retired  Tobacco Use  . Smoking status: Never Smoker  . Smokeless tobacco: Never Used  . Tobacco comment: Non-smoker. Smoking cessation materials contraindicated  Vaping Use  . Vaping Use: Never used  Substance and Sexual Activity  . Alcohol use: No    Alcohol/week: 0.0 standard drinks  . Drug use: No  . Sexual activity: Never  Other Topics Concern  . Not on file  Social History Narrative  . Not on file   Social Determinants of Health   Financial Resource Strain: Not on file  Food Insecurity: Not on file  Transportation Needs: Not on file  Physical Activity: Not on file  Stress: Not on file  Social Connections: Not on file    ECOG Status: 2 - Symptomatic, <50% confined to bed  Review of Systems: A 12 point ROS discussed and pertinent positives are indicated in the HPI above.  All other systems are negative.  Review of Systems  Vital Signs: There were no vitals taken for this visit.  Physical Exam   Imaging:  Personal review of lumbar spine MRI performed 12/08/2020 demonstrates severe (approximate 75%) compression deformity involving the L2 vertebral body with mild (approximately 25%) compression deformities involving the superior endplates of L3 and L4.  STIR signal abnormality and  persistent fracture lines are seen at all affected vertebral body levels.  There is no significant retropulsion.   MR LUMBAR SPINE WO CONTRAST  Result Date: 12/09/2020 CLINICAL DATA:  Chronic low back pain and lower body pain. Numbness in both legs. EXAM: MRI LUMBAR SPINE WITHOUT CONTRAST TECHNIQUE: Multiplanar, multisequence MR imaging of the lumbar spine was performed. No intravenous contrast was administered. COMPARISON:  09/17/2017 FINDINGS: Segmentation:  Standard Alignment:  Grade 1 anterolisthesis at L4-5 and L5-S1, unchanged Vertebrae: Compression fractures at L2, L3 and L4 are new compared to 09/17/2017. There is moderate bone marrow edema with early osteonecrotic cleft formation at L3. At L2 there is slight retropulsion measuring 3 mm. The degree of height loss is greatest at L2, approximately 75%. Conus medullaris and cauda equina: Conus extends to the L1 level. Conus and cauda equina appear normal. Paraspinal and other soft tissues: Negative Disc levels: L1-L2: Small disc bulge with minimal retropulsion of L2. Mild spinal canal stenosis. Mild left neural foraminal stenosis. L2-L3: Normal disc space and facet joints. No spinal canal stenosis. No neural foraminal stenosis. L3-L4: Small disc bulge with mild facet hypertrophy. Disc bulges asymmetric to the right. Right lateral recess narrowing without central spinal canal stenosis. Moderate right neural foraminal stenosis. L4-L5: Large central disc protrusion, slightly increased. Severe facet hypertrophy. Progression of moderate spinal canal stenosis. Severe right neural foraminal stenosis. L5-S1: Severe facet hypertrophy. No spinal canal stenosis. Severe left neural foraminal stenosis. Visualized sacrum: Normal. IMPRESSION: 1. Compression fractures at L2, L3 and L4 are new compared to 09/17/2017 and likely subacute. 2. Progression of moderate spinal canal stenosis at L4-L5 with severe right neural foraminal stenosis. 3. Severe left L5-S1 neural  foraminal stenosis, unchanged. 4. Moderate right L3-4 neural foraminal stenosis, unchanged. Electronically Signed   By: Ulyses Jarred M.D.   On: 12/09/2020 00:44    Labs:  CBC: Recent Labs    12/19/19 1134 09/12/20 1802  WBC 5.5 2.9*  HGB 13.5 12.4  HCT 41.2 39.4  PLT 150 150    COAGS: No results for input(s): INR, APTT in the last 8760 hours.  BMP: Recent Labs    12/19/19 1134 03/31/20 1252 09/12/20 1802  NA 138 141 137  K 4.1 4.4 4.0  CL 100 104 100  CO2 32 29 27  GLUCOSE 319* 250* 244*  BUN 28* 20 18  CALCIUM 9.5 9.4 8.9  CREATININE 1.01* 0.92* 0.77  GFRNONAA 53*  --  >  60  GFRAA 61  --   --     LIVER FUNCTION TESTS: Recent Labs    12/19/19 1134  BILITOT 0.8  AST 16  ALT 17  PROT 6.8    TUMOR MARKERS: No results for input(s): AFPTM, CEA, CA199, CHROMGRNA in the last 8760 hours.  Assessment and Plan:  Lindsey [name] is a 80 y.o. female with past medical history significant for breast cancer, depression, diabetes, hypertension, hyperlipidemia, hypothyroidism and osteoporosis who has been referred to the interventional radiology clinic for evaluation for potential percutaneous management of symptomatic L2, L3 and L4 compression fractures demonstrated on lumbar spine MRI performed 12/08/2020.  Personal review of lumbar spine MRI performed 12/08/2020 demonstrates severe (approximate 75%) compression deformity involving the L2 vertebral body with mild (approximately 25%) compression deformities involving the superior endplates of L3 and L4.  STIR signal abnormality and persistent fracture lines are seen at all affected vertebral body levels. There is no significant retropulsion.  She currently rates her  back pain which is primarily localized to her lower lumbar spine as 8-9/10 at rest and 10/10 with activity.    Given above, I feel the patient is a good candidate for attempted image guided L2, L3 and L4 cement augmentation (Attempted kyphoplasty of L3 and L4 with  likely a vertebroplasty of L2 given significant height loss at this level).  Risks and benefits of multilevel lumbar vertebral body cement augmentation was discussed with the patient and the patient's daughter including, but not limited to education regarding the natural healing process of compression fractures without intervention, bleeding, infection, cement migration which Lombardo cause spinal cord damage and/or paralysis.   I explained that this procedure is performed for the purposes of taking away any fracture related back pain and would do nothing to alleviate any pre-existing back pain attributable to degenerative disc disease, spinal stenosis, generalized degenerative change or muscle strain.   I explained that as her symptoms have been persistent for the past 6 months she Mur[place] experience a slightly delayed recovery however I am hopeful we will be able to return the patient to her baseline pain level and functional level of activity.    Following this prolonged and detailed conversation, the patient and the patient's daughter wished to proceed with multilevel vertebral body cement augmentation.  Per request, the patient would prefer to have this procedure performed at the [facility].  Fortunately, there is availability for this coming Thursday, 3/5 and the patient and the procedure has been discussed with Dr. Laurence Ferrari who will be performing procedures at that institution on that date.    The patient and patient's daughter are agreeable to have Dr. Laurence Ferrari perform the procedure and know to call the interventional radiology clinic in clinic with any interval questions or concerns  Plan:  - Obtain insurance authorization for L2, L3 and L4 vertebral body cement augmentation hopefully to be performed this coming Thursday, 3/5 by Dr. Laurence Ferrari.  Thank you for this interesting consult.  I greatly enjoyed meeting Lindsey [name] and look forward to participating in their care.  A copy of  this report was sent to the requesting provider on this date.  Electronically Signed: Sandi Mariscal 12/17/2020, 2:32 PM   I spent a total of 30 Minutes in face to face in clinical consultation, greater than 50% of which was counseling/coordinating care for symptomatic compression fractures of L2, L3 and L4.

## 2020-12-18 ENCOUNTER — Telehealth: Payer: Self-pay

## 2020-12-18 ENCOUNTER — Other Ambulatory Visit: Payer: Self-pay

## 2020-12-18 DIAGNOSIS — M4856XA Collapsed vertebra, not elsewhere classified, lumbar region, initial encounter for fracture: Secondary | ICD-10-CM

## 2020-12-18 NOTE — Telephone Encounter (Signed)
Phone call to pts daughter, [name], who is the patients primary caregiver to discuss the pts upcoming procedure with [place], Kyphoplasty on 12/25/20. We discussed pts allergies and most recent weight. [name] reports the patient has no known allergies. Most recent weight was documented per [facility]. Also, discharge instructions reviewed with Cindy. All questions were answered and she has our direct phone number if she were to have any questions.

## 2020-12-19 DIAGNOSIS — M4856XA Collapsed vertebra, not elsewhere classified, lumbar region, initial encounter for fracture: Secondary | ICD-10-CM | POA: Diagnosis not present

## 2020-12-19 LAB — CBC
HCT: 40.7 % (ref 35.0–45.0)
Hemoglobin: 13 g/dL (ref 11.7–15.5)
MCH: 27.3 pg (ref 27.0–33.0)
MCHC: 31.9 g/dL — ABNORMAL LOW (ref 32.0–36.0)
MCV: 85.5 fL (ref 80.0–100.0)
MPV: 10.8 fL (ref 7.5–12.5)
Platelets: 208 10*3/uL (ref 140–400)
RBC: 4.76 10*6/uL (ref 3.80–5.10)
RDW: 14.2 % (ref 11.0–15.0)
WBC: 4.6 10*3/uL (ref 3.8–10.8)

## 2020-12-23 ENCOUNTER — Ambulatory Visit (INDEPENDENT_AMBULATORY_CARE_PROVIDER_SITE_OTHER): Payer: PPO

## 2020-12-23 ENCOUNTER — Other Ambulatory Visit: Payer: PPO

## 2020-12-23 DIAGNOSIS — Z0000 Encounter for general adult medical examination without abnormal findings: Secondary | ICD-10-CM

## 2020-12-23 NOTE — Progress Notes (Signed)
Subjective:   Lindsey [name] is a 80 y.o. female who presents for Medicare Annual (Subsequent) preventive examination.  Virtual Visit via Telephone Note  I connected with  Lindsey [name] on 12/23/20 at  3:30 PM EDT by telephone and verified that I am speaking with the correct person using two identifiers.  Location: Patient: home Provider: [place] Persons participating in the virtual visit: [name]   I discussed the limitations, risks, security and privacy concerns of performing an evaluation and management service by telephone and the availability of in person appointments. The patient expressed understanding and agreed to proceed.  Interactive audio and video telecommunications were attempted between this nurse and patient, however failed, due to patient having technical difficulties OR patient did not have access to video capability.  We continued and completed visit with audio only.  Some vital signs [place] be absent or patient reported.   Lindsey Marker, LPN    Review of Systems     Cardiac Risk Factors include: advanced age (>98mn, >>36women);diabetes mellitus;dyslipidemia;hypertension;sedentary lifestyle     Objective:    Today's Vitals   12/23/20 1546  PainSc: 8    There is no height or weight on file to calculate BMI.  Advanced Directives 12/23/2020 09/12/2020 12/04/2019 12/08/2018 07/29/2017 04/29/2017 10/28/2016  Does Patient Have a Medical Advance Directive? Yes No No Yes Yes No No  Type of AParamedicof [place] will - - [place] will [place] will - -  Does patient want to make changes to medical advance directive? - - - No - Patient declined - - -  Copy of HAmanain Chart? No - copy requested - - - No - copy requested - -  Would patient like information on creating a medical advance directive? - No - Patient declined No - Patient declined - - - -     Current Medications (verified) Outpatient Encounter Medications as of 12/23/2020  Medication Sig  . acetaminophen (TYLENOL) 325 MG tablet Take 2 tablets (650 mg total) by mouth every 6 (six) hours.  .Marland Kitchenatorvastatin (LIPITOR) 40 MG tablet Take 1 tablet (40 mg total) by mouth daily.  . B-D UF III MINI PEN NEEDLES 31G X 5 MM MISC USE AS DIRECTED BY DOCTOR, USE TO INJECT INSULIN  . blood glucose meter kit and supplies Dispense based on patient and insurance preference. Use up to four times daily as directed. (FOR ICD-10 E10.9, E11.9).  . Lancets (ONETOUCH DELICA PLUS [id number] MISC USE TO TEST UP TO FOUR TIMES A DAY AS DIRECTED  . levothyroxine (SYNTHROID) 112 MCG tablet Take 1 tablet (112 mcg total) by mouth daily. And half on Sundays  . lisinopril (ZESTRIL) 40 MG tablet Take 1 tablet (40 mg total) by mouth at bedtime.  .Glory RosebushULTRA test strip USE TO TEST UP TO FOUR TIMES A DAY  . pregabalin (LYRICA) 75 MG capsule TAKE 1 CAPSULE(75 MG) BY MOUTH TWICE DAILY  . tiZANidine (ZANAFLEX) 2 MG tablet 1 tablet as needed  . traMADol-acetaminophen (ULTRACET) 37.5-325 MG tablet 2 tablets as needed  . TRESIBA FLEXTOUCH 100 UNIT/ML FlexTouch Pen INJECT 10 UNITS INTO THE SKIN ONCE DAILY  . calcium-vitamin D (OSCAL WITH D) 500-200 MG-UNIT tablet Take 1 tablet by mouth daily. 6075mcalcium 800 units Vitamin D (Patient not taking: Reported on 12/23/2020)  . cholecalciferol (VITAMIN D) 1000 UNITS tablet Take by mouth. (Patient not taking: Reported on 12/23/2020)  . ketoconazole (  NIZORAL) 2 % cream Apply topically 2 (two) times daily. (Patient not taking: Reported on 12/23/2020)  . letrozole (FEMARA) 2.5 MG tablet Take 2.5 mg by mouth daily. (Patient not taking: Reported on 12/23/2020)  . metFORMIN (GLUCOPHAGE) 850 MG tablet 1 tablet with a meal (Patient not taking: Reported on 12/23/2020)  . traZODone (DESYREL) 50 MG tablet 1 tablet at bedtime. (Patient not taking: Reported on 12/23/2020)  . vitamin B-12 (CYANOCOBALAMIN)  1000 MCG tablet Take 1,000 mcg by mouth every morning. (Patient not taking: Reported on 12/23/2020)  . [DISCONTINUED] acetaminophen-codeine (TYLENOL #3) 300-30 MG tablet Take 1-2 tablets by mouth every 6 (six) hours as needed.  . [DISCONTINUED] aspirin 81 MG tablet Take by mouth.   No facility-administered encounter medications on file as of 12/23/2020.    Allergies (verified) Patient has no known allergies.   History: Past Medical History:  Diagnosis Date  . Anxiety   . Arthritis   . Breast cancer ([place]) 1983   right breast cancer  . Breast cancer in female 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 Bursitis    R knee  . Closed fracture of left ulna and radius   . Complication of anesthesia   . Depression   . Diabetes mellitus type 2, controlled, without complications ([place]) 01/24/8755  . Diabetes mellitus without complication ([place])   . GERD (gastroesophageal reflux disease)    TUMS  . Hyperlipidemia   . Hypertension   . Hypothyroidism   . Osteoporosis   . 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 PONV (postoperative nausea and vomiting)   . Thyroid disease    Past Surgical History:  Procedure Laterality Date  . BREAST BIOPSY Left 11-04-15  . BREAST BIOPSY Left 10-10-07  . CATARACT EXTRACTION Bilateral 1990  . CHOLECYSTECTOMY    . IR RADIOLOGIST EVAL & MGMT  12/17/2020  . MASTECTOMY Right 1983   Dr Myrle [name]  . MASTECTOMY Right 1983  . MASTECTOMY W/ SENTINEL NODE BIOPSY Left 12/12/2015   Procedure: MASTECTOMY WITH SENTINEL LYMPH NODE BIOPSY;  Surgeon: Christene Lye, MD;  Location: [facility];  Service: General;  Laterality: Left;  . ORIF ULNAR FRACTURE Left 12/11/2018   Procedure: OPEN REDUCTION INTERNAL FIXATION (ORIF) LEFT PROXIMAL ULNA FRACTURE AND  RADIAL HEAD REPLACEMENT;  Surgeon: Lindsey Jakob, MD;  Location: [place];  Service: Orthopedics;  Laterality: Left;  . RADIAL HEAD ARTHROPLASTY Left  12/11/2018   Procedure: OPEN REDUCTION INTERNAL FIXATION (ORIF) LEFT PROXIMAL ULNA FRACTURE AND  RADIAL HEAD REPLACEMENT;  Surgeon: Lindsey Jakob, MD;  Location: [place];  Service: Orthopedics;  Laterality: Left;   Family History  Problem Relation Age of Onset  . Kidney disease Mother   . Heart disease Father   . Stroke Sister   . Cancer Sister        breast  . Breast cancer Sister   . Stroke Brother   . Diabetes Brother   . Hypertension Brother   . Cancer Daughter 43       DCIS/lumpectomy/genetic negative  . Breast cancer Daughter    Social History   Socioeconomic History  . Marital status: Married    Spouse name: Not on file  . Number of children: 3  . Years of education: Not on file  . Highest education level: Not on file  Occupational History  . Occupation: Retired  Tobacco Use  . Smoking status: Never Smoker  . Smokeless tobacco:  Never Used  . Tobacco comment: Non-smoker. Smoking cessation materials contraindicated  Vaping Use  . Vaping Use: Never used  Substance and Sexual Activity  . Alcohol use: No    Alcohol/week: 0.0 standard drinks  . Drug use: No  . Sexual activity: Never  Other Topics Concern  . Not on file  Social History Narrative  . Not on file   Social Determinants of Health   Financial Resource Strain: Low Risk   . Difficulty of Paying Living Expenses: Not hard at all  Food Insecurity: No Food Insecurity  . Worried About Charity fundraiser in the Last Year: Never true  . Ran Out of Food in the Last Year: Never true  Transportation Needs: No Transportation Needs  . Lack of Transportation (Medical): No  . Lack of Transportation (Non-Medical): No  Physical Activity: Inactive  . Days of Exercise per Week: 0 days  . Minutes of Exercise per Session: 0 min  Stress: Stress Concern Present  . Feeling of Stress : Rather much  Social Connections: Moderately Isolated  . Frequency of Communication with Friends and Family: Three  times a week  . Frequency of Social Gatherings with Friends and Family: Never  . Attends Religious Services: Never  . Active Member of Clubs or Organizations: No  . Attends Archivist Meetings: Never  . Marital Status: Married    Tobacco Counseling Counseling given: Not Answered Comment: Non-smoker. Smoking cessation materials contraindicated   Clinical Intake:  Pre-visit preparation completed: Yes  Pain : 0-10 Pain Score: 8  Pain Type: Chronic pain Pain Location: Back Pain Descriptors / Indicators: Aching,Sore Pain Onset: More than a month ago Pain Frequency: Constant     Nutritional Risks: None Diabetes: Yes CBG done?: No Did pt. bring in CBG monitor from home?: No  How often do you need to have someone help you when you read instructions, pamphlets, or other written materials from your doctor or pharmacy?: 1 - Never  Nutrition Risk Assessment:  Has the patient had any N/V/D within the last 2 months?  No  Does the patient have any non-healing wounds?  No  Has the patient had any unintentional weight loss or weight gain?  No   Diabetes:  Is the patient diabetic?  Yes  If diabetic, was a CBG obtained today?  No  Did the patient bring in their glucometer from home?  No  How often do you monitor your CBG's? daily.   Financial Strains and Diabetes Management:  Are you having any financial strains with the device, your supplies or your medication? No .  Does the patient want to be seen by Chronic Care Management for management of their diabetes?  No  Would the patient like to be referred to a Nutritionist or for Diabetic Management?  No   Diabetic Exams:  Diabetic Eye Exam: Completed 05/07/20.  Diabetic Foot Exam: Completed 12/19/19. Pt has been advised about the importance in completing this exam. Pt is scheduled for diabetic foot exam on 01/06/21.    Interpreter Needed?: No  Information entered by :: Lindsey Marker LPN   Activities of Daily  Living In your present state of health, do you have any difficulty performing the following activities: 12/23/2020 10/29/2020  Hearing? Tempie Donning  Comment wears hearing aids -  Vision? N N  Difficulty concentrating or making decisions? Tempie Donning  Walking or climbing stairs? Y Y  Dressing or bathing? Y N  Doing errands, shopping? Tempie Donning  Conservation officer, nature and  eating ? N -  Using the Toilet? N -  In the past six months, have you accidently leaked urine? Y -  Comment wears pads/depends for protection -  Do you have problems with loss of bowel control? N -  Managing your Medications? N -  Managing your Finances? Y -  Housekeeping or managing your Housekeeping? N -  Some recent data might be hidden    Patient Care Team: [name], MD as PCP - General (Family Medicine) Melrose Nakayama, MD as Consulting Physician (Orthopedic Surgery) [name], MD as Attending Physician (Family Medicine) Germaine Pomfret, [facility] as Pharmacist (Pharmacist)  Indicate any recent Medical Services you [place] have received from other than [facility] providers in the past year (date Remo be approximate).     Assessment:   This is a routine wellness examination for Ila.  Hearing/Vision screen  Hearing Screening   _0  _1  _2  _3  _4  _5  _6  _7  _8   Right ear:           Left ear:           Comments: Pt wears hearing aids   Vision Screening Comments: Sees Dr. Gloriann Loan for annual eye exams  Dietary issues and exercise activities discussed: Current Exercise Habits: The patient does not participate in regular exercise at present, Exercise limited by: orthopedic condition(s)  Goals Addressed            This Visit's Progress   . DIET - INCREASE WATER INTAKE   Not on track    Recommend to drink 6-8 8oz glasses of water per day      Depression Screen PHQ 2/9 Scores 12/23/2020 10/29/2020 08/12/2020 07/03/2020 06/23/2020 06/23/2020 12/19/2019  PHQ - 2 Score _9 0  PHQ- 9 Score _10 - 0     Fall Risk Fall Risk  12/23/2020 10/29/2020 08/12/2020 07/03/2020 06/23/2020  Falls in the past year? 0 0 0 0 0  Number falls in past yr: 0 0 0 0 0  Injury with Fall? 0 0 0 0 0  Risk for fall due to : Impaired balance/gait;Impaired mobility;Orthopedic patient - - - -  Risk for fall due to: Comment - - - - -  Follow up Falls prevention discussed Falls prevention discussed - - -    FALL RISK PREVENTION PERTAINING TO THE HOME:  Any stairs in or around the home? No  If so, are there any without handrails? No  Home free of loose throw rugs in walkways, pet beds, electrical cords, etc? Yes  Adequate lighting in your home to reduce risk of falls? Yes   ASSISTIVE DEVICES UTILIZED TO PREVENT FALLS:  Life alert? No  Use of a cane, walker or w/c? Yes  Grab bars in the bathroom? No  Shower chair or bench in shower? Yes  Elevated toilet seat or a handicapped toilet? No   TIMED UP AND GO:  Was the test performed? No . Telephonic visit.   Cognitive Function: Normal cognitive status assessed by direct observation by this Nurse Health Advisor. No abnormalities found.       6CIT Screen 12/04/2019 07/29/2017  What Year? 0 points 0 points  What month? 0 points 0 points  What time? 0 points 0 points  Count back from 20 0 points 0 points  Months in reverse 0 points 0 points  Repeat phrase 0 points 4 points  Total Score 0 4    Immunizations Immunization History  Administered Date(s) Administered  .  Fluad Quad(high Dose 65+) 05/30/2019, 06/23/2020  . Influenza, High Dose Seasonal PF 06/12/2014, 06/09/2015, 07/01/2016, 04/29/2017, 06/28/2018  . Influenza,inj,Quad PF,6+ Mos 07/23/2013  . PFIZER(Purple Top)SARS-COV-2 Vaccination 09/29/2019, 10/20/2019  . Pneumococcal Conjugate-13 08/13/2014  . Pneumococcal Polysaccharide-23 07/29/2017  . Tdap 06/23/2020    TDAP status: Up to date  Flu Vaccine status: Up to date  Pneumococcal vaccine status: Up to date  Covid-19 vaccine status:  Completed vaccines  Qualifies for Shingles Vaccine? Yes   Zostavax completed No   Shingrix Completed?: No.    Education has been provided regarding the importance of this vaccine. Patient has been advised to call insurance company to determine out of pocket expense if they have not yet received this vaccine. Advised Heino also receive vaccine at local pharmacy or Health Dept. Verbalized acceptance and understanding.  Screening Tests Health Maintenance  Topic Date Due  . COVID-19 Vaccine (3 - Pfizer risk 4-dose series) 11/17/2019  . FOOT EXAM  12/18/2020  . HEMOGLOBIN A1C  12/21/2020  . INFLUENZA VACCINE  03/23/2021  . OPHTHALMOLOGY EXAM  05/07/2021  . TETANUS/TDAP  06/23/2030  . DEXA SCAN  Completed  . PNA vac Low Risk Adult  Completed  . HPV VACCINES  Aged Out    Health Maintenance  Health Maintenance Due  Topic Date Due  . COVID-19 Vaccine (3 - Pfizer risk 4-dose series) 11/17/2019  . FOOT EXAM  12/18/2020  . HEMOGLOBIN A1C  12/21/2020    Colorectal cancer screening: No longer required.   Mammogram status: No longer required due to bilateral mastectomy.  Bone Density status: Completed 04/30/20. Results reflect: Bone density results: OSTEOPOROSIS. Repeat every 2 years.  Lung Cancer Screening: (Low Dose CT Chest recommended if Age 7-80 years, 30 pack-year currently smoking OR have quit w/in 15years.) does not qualify.   Additional Screening:  Hepatitis C Screening: does not qualify.  Vision Screening: Recommended annual ophthalmology exams for early detection of glaucoma and other disorders of the eye. Is the patient up to date with their annual eye exam?  Yes  Who is the provider or what is the name of the office in which the patient attends annual eye exams? Dr. Gloriann Loan.   Dental Screening: Recommended annual dental exams for proper oral hygiene  Community Resource Referral / Chronic Care Management: CRR required this visit?  No   CCM required this visit?  No       Plan:     I have personally reviewed and noted the following in the patient's chart:   . Medical and social history . Use of alcohol, tobacco or illicit drugs  . Current medications and supplements including opioid prescriptions.  . Functional ability and status . Nutritional status . Physical activity . Advanced directives . List of other physicians . Hospitalizations, surgeries, and ER visits in previous 12 months . Vitals . Screenings to include cognitive, depression, and falls . Referrals and appointments  In addition, I have reviewed and discussed with patient certain preventive protocols, quality metrics, and best practice recommendations. A written personalized care plan for preventive services as well as general preventive health recommendations were provided to patient.     Lindsey Marker, LPN   11/30/4494   Nurse Notes: pt scheduled for back surgery this week. Pt aware needs follow up with PCP in office for labs.

## 2020-12-23 NOTE — Patient Instructions (Signed)
[name] , Thank you for taking time to come for your Medicare Wellness Visit. I appreciate your ongoing commitment to your health goals. Please review the following plan we discussed and let me know if I can assist you in the future.   Screening recommendations/referrals: Colonoscopy: no longer required Mammogram: no longer required Bone Density: done 04/30/20.  Recommended yearly ophthalmology/optometry visit for glaucoma screening and checkup Recommended yearly dental visit for hygiene and checkup  Vaccinations: Influenza vaccine: done 06/23/20 Pneumococcal vaccine: done 07/29/17 Tdap vaccine: done 06/23/20 Shingles vaccine: Shingrix discussed. Please contact your pharmacy for coverage information.  Covid-19: done 09/29/19 & 10/20/19   Advanced directives: Please bring a copy of your health care power of attorney and living will to the office at your convenience.  Conditions/risks identified: Recommend drinking 6-8 glasses of water per day   Next appointment: Follow up in one year for your annual wellness visit    Preventive Care 65 Years and Older, Female Preventive care refers to lifestyle choices and visits with your health care provider that can promote health and wellness. What does preventive care include?  A yearly physical exam. This is also called an annual well check.  Dental exams once or twice a year.  Routine eye exams. Ask your health care provider how often you should have your eyes checked.  Personal lifestyle choices, including:  Daily care of your teeth and gums.  Regular physical activity.  Eating a healthy diet.  Avoiding tobacco and drug use.  Limiting alcohol use.  Practicing safe sex.  Taking low-dose aspirin every day.  Taking vitamin and mineral supplements as recommended by your health care provider. What happens during an annual well check? The services and screenings done by your health care provider during your annual well check will depend  on your age, overall health, lifestyle risk factors, and family history of disease. Counseling  Your health care provider Marucci ask you questions about your:  Alcohol use.  Tobacco use.  Drug use.  Emotional well-being.  Home and relationship well-being.  Sexual activity.  Eating habits.  History of falls.  Memory and ability to understand (cognition).  Work and work Statistician.  Reproductive health. Screening  You Schiltz have the following tests or measurements:  Height, weight, and BMI.  Blood pressure.  Lipid and cholesterol levels. These Clavin be checked every 5 years, or more frequently if you are over 4 years old.  Skin check.  Lung cancer screening. You Leonetti have this screening every year starting at age 57 if you have a 30-pack-year history of smoking and currently smoke or have quit within the past 15 years.  Fecal occult blood test (FOBT) of the stool. You Kowalchuk have this test every year starting at age 90.  Flexible sigmoidoscopy or colonoscopy. You Lung have a sigmoidoscopy every 5 years or a colonoscopy every 10 years starting at age 58.  Hepatitis C blood test.  Hepatitis B blood test.  Sexually transmitted disease (STD) testing.  Diabetes screening. This is done by checking your blood sugar (glucose) after you have not eaten for a while (fasting). You Demas have this done every 1-3 years.  Bone density scan. This is done to screen for osteoporosis. You Klemmer have this done starting at age 42.  Mammogram. This [place] be done every 1-2 years. Talk to your health care provider about how often you should have regular mammograms. Talk with your health care provider about your test results, treatment options, and if necessary, the  need for more tests. Vaccines  Your health care provider Couzens recommend certain vaccines, such as:  Influenza vaccine. This is recommended every year.  Tetanus, diphtheria, and acellular pertussis (Tdap, Td) vaccine. You Jagiello need a Td  booster every 10 years.  Zoster vaccine. You Whipple need this after age 13.  Pneumococcal 13-valent conjugate (PCV13) vaccine. One dose is recommended after age 38.  Pneumococcal polysaccharide (PPSV23) vaccine. One dose is recommended after age 44. Talk to your health care provider about which screenings and vaccines you need and how often you need them. This information is not intended to replace advice given to you by your health care provider. Make sure you discuss any questions you have with your health care provider. Document Released: 09/05/2015 Document Revised: 04/28/2016 Document Reviewed: 06/10/2015 Elsevier Interactive Patient Education  2017 [place] Prevention in the Home Falls can cause injuries. They can happen to people of all ages. There are many things you can do to make your home safe and to help prevent falls. What can I do on the outside of my home?  Regularly fix the edges of walkways and driveways and fix any cracks.  Remove anything that might make you trip as you walk through a door, such as a raised step or threshold.  Trim any bushes or trees on the path to your home.  Use bright outdoor lighting.  Clear any walking paths of anything that might make someone trip, such as rocks or tools.  Regularly check to see if handrails are loose or broken. Make sure that both sides of any steps have handrails.  Any raised decks and porches should have guardrails on the edges.  Have any leaves, snow, or ice cleared regularly.  Use sand or salt on walking paths during winter.  Clean up any spills in your garage right away. This includes oil or grease spills. What can I do in the bathroom?  Use night lights.  Install grab bars by the toilet and in the tub and shower. Do not use towel bars as grab bars.  Use non-skid mats or decals in the tub or shower.  If you need to sit down in the shower, use a plastic, non-slip stool.  Keep the floor dry. Clean up  any water that spills on the floor as soon as it happens.  Remove soap buildup in the tub or shower regularly.  Attach bath mats securely with double-sided non-slip rug tape.  Do not have throw rugs and other things on the floor that can make you trip. What can I do in the bedroom?  Use night lights.  Make sure that you have a light by your bed that is easy to reach.  Do not use any sheets or blankets that are too big for your bed. They should not hang down onto the floor.  Have a firm chair that has side arms. You can use this for support while you get dressed.  Do not have throw rugs and other things on the floor that can make you trip. What can I do in the kitchen?  Clean up any spills right away.  Avoid walking on wet floors.  Keep items that you use a lot in easy-to-reach places.  If you need to reach something above you, use a strong step stool that has a grab bar.  Keep electrical cords out of the way.  Do not use floor polish or wax that makes floors slippery. If you must use wax,  use non-skid floor wax.  Do not have throw rugs and other things on the floor that can make you trip. What can I do with my stairs?  Do not leave any items on the stairs.  Make sure that there are handrails on both sides of the stairs and use them. Fix handrails that are broken or loose. Make sure that handrails are as long as the stairways.  Check any carpeting to make sure that it is firmly attached to the stairs. Fix any carpet that is loose or worn.  Avoid having throw rugs at the top or bottom of the stairs. If you do have throw rugs, attach them to the floor with carpet tape.  Make sure that you have a light switch at the top of the stairs and the bottom of the stairs. If you do not have them, ask someone to add them for you. What else can I do to help prevent falls?  Wear shoes that:  Do not have high heels.  Have rubber bottoms.  Are comfortable and fit you well.  Are  closed at the toe. Do not wear sandals.  If you use a stepladder:  Make sure that it is fully opened. Do not climb a closed stepladder.  Make sure that both sides of the stepladder are locked into place.  Ask someone to hold it for you, if possible.  Clearly mark and make sure that you can see:  Any grab bars or handrails.  First and last steps.  Where the edge of each step is.  Use tools that help you move around (mobility aids) if they are needed. These include:  Canes.  Walkers.  Scooters.  Crutches.  Turn on the lights when you go into a dark area. Replace any light bulbs as soon as they burn out.  Set up your furniture so you have a clear path. Avoid moving your furniture around.  If any of your floors are uneven, fix them.  If there are any pets around you, be aware of where they are.  Review your medicines with your doctor. Some medicines can make you feel dizzy. This can increase your chance of falling. Ask your doctor what other things that you can do to help prevent falls. This information is not intended to replace advice given to you by your health care provider. Make sure you discuss any questions you have with your health care provider. Document Released: 06/05/2009 Document Revised: 01/15/2016 Document Reviewed: 09/13/2014 Elsevier Interactive Patient Education  2017 [employer].

## 2020-12-25 ENCOUNTER — Other Ambulatory Visit: Payer: Self-pay | Admitting: Interventional Radiology

## 2020-12-25 ENCOUNTER — Ambulatory Visit
Admission: RE | Admit: 2020-12-25 | Discharge: 2020-12-25 | Disposition: A | Discharge status: Home / Self Care | Payer: PPO | Source: Ambulatory Visit | Attending: Orthopaedic Surgery | Admitting: Orthopaedic Surgery

## 2020-12-25 ENCOUNTER — Other Ambulatory Visit: Payer: Self-pay

## 2020-12-25 ENCOUNTER — Other Ambulatory Visit: Payer: Self-pay | Admitting: Orthopaedic Surgery

## 2020-12-25 DIAGNOSIS — M4856XA Collapsed vertebra, not elsewhere classified, lumbar region, initial encounter for fracture: Secondary | ICD-10-CM

## 2020-12-25 DIAGNOSIS — Z9889 Other specified postprocedural states: Secondary | ICD-10-CM

## 2020-12-25 HISTORY — PX: IR KYPHO EA ADDL LEVEL THORACIC OR LUMBAR: IMG5520

## 2020-12-25 HISTORY — PX: IR KYPHO LUMBAR INC FX REDUCE BONE BX UNI/BIL CANNULATION INC/IMAGING: IMG5519

## 2020-12-25 HISTORY — PX: IR VERTEBROPLASTY LUMBAR BX INC UNI/BIL INC/INJECT/IMAGING: IMG5516

## 2020-12-25 MED ORDER — MIDAZOLAM HCL 2 MG/2ML IJ SOLN
1.0000 mg | INTRAMUSCULAR | Status: DC | PRN
  Administered 2020-12-25 (×3): 0.5 mg via INTRAVENOUS
  Administered 2020-12-25: 1 mg via INTRAVENOUS

## 2020-12-25 MED ORDER — SODIUM CHLORIDE 0.9 % IV SOLN: Freq: Once | INTRAVENOUS | Status: AC

## 2020-12-25 MED ORDER — KETOROLAC TROMETHAMINE 30 MG/ML IJ SOLN
30.0000 mg | Freq: Once | INTRAMUSCULAR | Status: AC
  Administered 2020-12-25: 30 mg via INTRAMUSCULAR

## 2020-12-25 MED ORDER — FENTANYL CITRATE (PF) 100 MCG/2ML IJ SOLN
25.0000 ug | INTRAMUSCULAR | Status: DC | PRN
  Administered 2020-12-25: 50 ug via INTRAVENOUS
  Administered 2020-12-25 (×2): 25 ug via INTRAVENOUS

## 2020-12-25 MED ORDER — CEFAZOLIN SODIUM-DEXTROSE 2-4 GM/100ML-% IV SOLN
2.0000 g | INTRAVENOUS | Status: AC
  Administered 2020-12-25: 2 g via INTRAVENOUS

## 2020-12-25 MED ORDER — IOPAMIDOL (ISOVUE-M 200) INJECTION 41%
1.0000 mL | Freq: Once | INTRAMUSCULAR | Status: AC
  Administered 2020-12-25: 1 mL

## 2020-12-25 NOTE — Discharge Instructions (Signed)
Discharge Instructions for Spinal Cement Augmentation  1. You Avetisyan resume a regular diet and any medications that you routinely take (including pain medications).  2. No driving the day of the procedure. 3. Upon discharge go home and rest for four hours.  Trapani use an ice pack as needed to injection sites on back. 4. You Coiro change the dressing on your back to bandaids tomorrow.  Change the bandaids daily until sites healed. 5. Follow up with your doctor in 2-3 weeks to let him/her know how you are feeling. 6. Discuss with your doctor if bone-building therapy is appropriate for you, if you are not on this type of medication already.    Please contact our office at [phone] for the following symptoms:   Fever greater than 100 degrees  Increased swelling, pain, or redness at injection site.   Thank you for visiting [facility].

## 2020-12-25 NOTE — Progress Notes (Signed)
Pt back in nursing recovery area after procedure. Pt awake, talking in complete sentences, and oriented. Pt reports no complaints at this time. Will continue to monitor until discharged by MD.

## 2020-12-31 DIAGNOSIS — M545 Low back pain, unspecified: Secondary | ICD-10-CM | POA: Diagnosis not present

## 2020-12-31 DIAGNOSIS — M7061 Trochanteric bursitis, right hip: Secondary | ICD-10-CM | POA: Diagnosis not present

## 2021-01-05 NOTE — Progress Notes (Signed)
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  Also low HDL, on statin therapy and denies myalgia. . Denies polyphagia, polyuria or polydipsia.. HgbA1Cwent from 7.1 % to 7.8%to 7.3%and up to 11 % but went down to  6.7% and today is 7.5 %. She came in today with her daughter Lindsey [name].   Hypothyroidism: she has been on Synthroid 112 mcg once daily and only half pill on Sunday , last TSH was normal 08/12/20 but previous one ws suppressed at 0.34  . She has not been taking medications lately due to depression   Malnutrition: she has lost 17 lbs in the past 5 lbs, she states not appetite, crying all the time, can go all day without food. Discussed importance of protein supplementation   Major Depression: She is feeling worse, husband is weak, she is now having a more pain, even after procedures, feels discouraged. Cannot wash dishes, get in her shower alone, do her laundry and is getting very discouraged and over the past 4 days she has been spending almost all day crying, not even eating. . We had switched her from zoloft to duloxetine but she stopped due to nausea and vomiting, she is willing to go back on Zoloft. She also stopped taking all medications due to pain  . Explained that not taking medications will make her other symptoms worse   Diarrhea: seeing [place] physicians, she was given probiotics, she is still having problems, it is down to once or twice daily and not well formed. No blood in stools   Osteoporosis with compression fractures: she is going to Ortho , Dr. Melrose Nakayama, and was sent for  vertebro plasty of L2-3-4 on [address] . She states her pain is intense, did not improve after procedure. Also had steroid injection on right outer hip a few days ago. Difficulty standing up ,  walking, also affecting her ability to take a shower on her own. She is upset because the pain is taking her ability to be independent. She also has OA knees. I gave her Lyrica but she stopped all medications due to depression. She would like to resume Tizanidine   Breast cancer : she lost to follow up with oncologist, stopped taking Femara, advised her to go back   Patient Active Problem List   Diagnosis Date Noted  . Primary osteoarthritis of both knees 01/06/2021  . Low serum vitamin B12 01/06/2021  . Chronic bilateral low back pain with right-sided sciatica 01/06/2021  . Dyslipidemia associated with type 2 diabetes mellitus ([place]) 01/06/2021  . Mild protein-calorie malnutrition ([place]) 01/06/2021  . Chronic pain syndrome 01/06/2021  . Diarrhea 10/28/2020  . Incontinence of feces 10/28/2020  . History of breast cancer 06/28/2018  . Bilateral lower extremity edema 01/16/2016  . Breast cancer, left breast ([place]) 11/05/2015  . 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 Controlled type 2 diabetes mellitus with microalbuminuria ([place]) 04/25/2015  . Hyperlipidemia 04/25/2015  . Benign hypertension 04/25/2015  . Adult hypothyroidism 04/25/2015  . At risk for falling 04/25/2015  . Major depression ([place]) 04/25/2015  . Post menopausal syndrome 04/25/2015    Past Surgical History:  Procedure Laterality Date  . BREAST BIOPSY Left 11-04-15  . BREAST BIOPSY Left  10-10-07  . CATARACT EXTRACTION Bilateral 1990  . CHOLECYSTECTOMY    . IR KYPHO EA ADDL LEVEL THORACIC OR LUMBAR  12/25/2020  . IR KYPHO LUMBAR INC FX REDUCE BONE BX UNI/BIL CANNULATION INC/IMAGING  12/25/2020  . IR RADIOLOGIST EVAL & MGMT  12/17/2020  . IR VERTEBROPLASTY LUMBAR BX INC UNI/BIL INC/INJECT/IMAGING  12/25/2020  . MASTECTOMY Right 1983   Dr Myrle Sheng  . MASTECTOMY Right 1983  . MASTECTOMY W/ SENTINEL NODE BIOPSY Left 12/12/2015   Procedure: MASTECTOMY WITH SENTINEL LYMPH NODE BIOPSY;  Surgeon: Christene Lye, MD;   Location: [facility];  Service: General;  Laterality: Left;  . ORIF ULNAR FRACTURE Left 12/11/2018   Procedure: OPEN REDUCTION INTERNAL FIXATION (ORIF) LEFT PROXIMAL ULNA FRACTURE AND  RADIAL HEAD REPLACEMENT;  Surgeon: Milly Jakob, MD;  Location: [place];  Service: Orthopedics;  Laterality: Left;  . RADIAL HEAD ARTHROPLASTY Left 12/11/2018   Procedure: OPEN REDUCTION INTERNAL FIXATION (ORIF) LEFT PROXIMAL ULNA FRACTURE AND  RADIAL HEAD REPLACEMENT;  Surgeon: Milly Jakob, MD;  Location: [place];  Service: Orthopedics;  Laterality: Left;    Family History  Problem Relation Age of Onset  . Kidney disease Mother   . Heart disease Father   . Stroke Sister   . Cancer Sister        breast  . Breast cancer Sister   . Stroke Brother   . Diabetes Brother   . Hypertension Brother   . Cancer Daughter 68       DCIS/lumpectomy/genetic negative  . Breast cancer Daughter     Social History   Tobacco Use  . Smoking status: Never Smoker  . Smokeless tobacco: Never Used  . Tobacco comment: Non-smoker. Smoking cessation materials contraindicated  Substance Use Topics  . Alcohol use: No    Alcohol/week: 0.0 standard drinks     Current Outpatient Medications:  .  acetaminophen (TYLENOL) 325 MG tablet, Take 2 tablets (650 mg total) by mouth every 6 (six) hours., Disp: , Rfl:  .  amLODipine (NORVASC) 2.5 MG tablet, Take 1 tablet (2.5 mg total) by mouth at bedtime., Disp: 90 tablet, Rfl: 0 .  B-D UF III MINI PEN NEEDLES 31G X 5 MM MISC, USE AS DIRECTED BY DOCTOR, USE TO INJECT INSULIN, Disp: 100 each, Rfl: 3 .  blood glucose meter kit and supplies, Dispense based on patient and insurance preference. Use up to four times daily as directed. (FOR ICD-10 E10.9, E11.9)., Disp: 1 each, Rfl: 0 .  calcium-vitamin D (OSCAL WITH D) 500-200 MG-UNIT tablet, Take 1 tablet by mouth daily. 624m calcium 800 units Vitamin D, Disp: , Rfl:  .  cholecalciferol (VITAMIN D) 1000  UNITS tablet, Take by mouth., Disp: , Rfl:  .  ketoconazole (NIZORAL) 2 % cream, Apply topically 2 (two) times daily., Disp: 60 g, Rfl: 1 .  Lancets (ONETOUCH DELICA PLUS [id number] MISC, USE TO TEST UP TO FOUR TIMES A DAY AS DIRECTED, Disp: , Rfl:  .  letrozole (FEMARA) 2.5 MG tablet, Take 2.5 mg by mouth daily., Disp: , Rfl:  .  levothyroxine (SYNTHROID) 112 MCG tablet, Take 1 tablet (112 mcg total) by mouth daily. And half on Sundays, Disp: 90 tablet, Rfl: 1 .  ONETOUCH ULTRA test strip, USE TO TEST UP TO FOUR TIMES A DAY, Disp: 300 strip, Rfl: 2 .  sertraline (ZOLOFT) 50 MG tablet, Take 1 tablet (50 mg total) by mouth daily., Disp: 90 tablet, Rfl: 0 .  TRESIBA FLEXTOUCH 100  UNIT/ML FlexTouch Pen, INJECT 10 UNITS INTO THE SKIN ONCE DAILY, Disp: 15 mL, Rfl: 0 .  atorvastatin (LIPITOR) 40 MG tablet, Take 1 tablet (40 mg total) by mouth daily., Disp: 90 tablet, Rfl: 1 .  lisinopril (ZESTRIL) 40 MG tablet, Take 1 tablet (40 mg total) by mouth at bedtime., Disp: 90 tablet, Rfl: 1 .  pregabalin (LYRICA) 75 MG capsule, Take 1 capsule (75 mg total) by mouth 3 (three) times daily., Disp: 270 capsule, Rfl: 0 .  tiZANidine (ZANAFLEX) 2 MG tablet, Take 1 tablet (2 mg total) by mouth every 8 (eight) hours as needed for muscle spasms., Disp: 90 tablet, Rfl: 0  No Known Allergies  I personally reviewed active problem list, medication list, allergies, family history, social history, health maintenance with the patient/caregiver today.   ROS  Constitutional: Negative for fever, positive for  weight change.  Respiratory: Negative for cough and shortness of breath.   Cardiovascular: Negative for chest pain or palpitations.  Gastrointestinal: Negative for abdominal pain, positive for  bowel changes - but stable.   Musculoskeletal: Negative for gait problem or joint swelling.  Skin: Negative for rash.  Neurological: Negative for dizziness or headache.  No other specific complaints in a complete review of  systems (except as listed in HPI above).  Objective  Vitals:   01/06/21 1049  BP: (!) 160/96  Pulse: 97  Resp: 16  Temp: 97.8 F (36.6 C)  TempSrc: Oral  SpO2: 98%  Weight: 165 lb (74.8 kg)  Height: '5\' 1"'  (1.549 m)    Body mass index is 31.18 kg/m.  Physical Exam  Constitutional: Patient appears well-developed and frail, crying No distress.  HEENT: head atraumatic, normocephalic, pupils equal and reactive to light,neck supple, Cardiovascular: Normal rate, regular rhythm and normal heart sounds.  No murmur heard. No BLE edema. varicose veins  Pulmonary/Chest: Effort normal and breath sounds normal. No respiratory distress. Abdominal: Soft.  There is no tenderness. Psychiatric: Patient crying and very depressed   Recent Results (from the past 2160 hour(s))  CBC     Status: Abnormal   Collection Time: 12/19/20 10:20 AM  Result Value Ref Range   WBC 4.6 3.8 - 10.8 Thousand/uL   RBC 4.76 3.80 - 5.10 Million/uL   Hemoglobin 13.0 11.7 - 15.5 g/dL   HCT 40.7 35.0 - 45.0 %   MCV 85.5 80.0 - 100.0 fL   MCH 27.3 27.0 - 33.0 pg   MCHC 31.9 (L) 32.0 - 36.0 g/dL   RDW 14.2 11.0 - 15.0 %   Platelets 208 140 - 400 Thousand/uL   MPV 10.8 7.5 - 12.5 fL  POCT HgB A1C     Status: Abnormal   Collection Time: 01/06/21 10:52 AM  Result Value Ref Range   Hemoglobin A1C 7.5 (A) 4.0 - 5.6 %   HbA1c POC (<> result, manual entry)     HbA1c, POC (prediabetic range)     HbA1c, POC (controlled diabetic range)      Diabetic Foot Exam: Diabetic Foot Exam - Simple   Simple Foot Form Visual Inspection No deformities, no ulcerations, no other skin breakdown bilaterally: Yes Sensation Testing Intact to touch and monofilament testing bilaterally: Yes Pulse Check Posterior Tibialis and Dorsalis pulse intact bilaterally: Yes Comments      PHQ2/9: Depression screen [facility] 2/9 01/06/2021 12/23/2020 10/29/2020 08/12/2020 07/03/2020  Decreased Interest '3 3 3 2 1  ' Down, Depressed, Hopeless '3 3 3 2 3   ' PHQ - 2 Score '6 6 6 4 ' 4  Altered sleeping 3 0 '3 2 2  ' Tired, decreased energy '3 2 2 3 2  ' Change in appetite '3 2 2 ' 0 3  Feeling bad or failure about yourself  3 0 0 2 2  Trouble concentrating 3 0 0 0 2  Moving slowly or fidgety/restless 1 0 0 2 3  Suicidal thoughts 3 0 0 0 2  PHQ-9 Score '25 10 13 13 20  ' Difficult doing work/chores - Somewhat difficult Somewhat difficult Very difficult Somewhat difficult  Some recent data might be hidden    phq 9 is positive   Fall Risk: Fall Risk  01/06/2021 12/23/2020 10/29/2020 08/12/2020 07/03/2020  Falls in the past year? 0 0 0 0 0  Number falls in past yr: 0 0 0 0 0  Injury with Fall? 0 0 0 0 0  Risk for fall due to : - Impaired balance/gait;Impaired mobility;Orthopedic patient - - -  Risk for fall due to: Comment - - - - -  Follow up - Falls prevention discussed Falls prevention discussed - -     Functional Status Survey: Is the patient deaf or have difficulty hearing?: Yes Does the patient have difficulty seeing, even when wearing glasses/contacts?: No Does the patient have difficulty concentrating, remembering, or making decisions?: Yes Does the patient have difficulty walking or climbing stairs?: Yes Does the patient have difficulty dressing or bathing?: Yes Does the patient have difficulty doing errands alone such as visiting a doctor's office or shopping?: Yes    Assessment & Plan  1. Dyslipidemia associated with type 2 diabetes mellitus (HCC)  - HM Diabetes Foot Exam - POCT HgB A1C - Lipid panel - Microalbumin / creatinine urine ratio - atorvastatin (LIPITOR) 40 MG tablet; Take 1 tablet (40 mg total) by mouth daily.  Dispense: 90 tablet; Refill: 1  2. Moderate episode of recurrent major depressive disorder (HCC)  - sertraline (ZOLOFT) 50 MG tablet; Take 1 tablet (50 mg total) by mouth daily.  Dispense: 90 tablet; Refill: 0  3. Adult hypothyroidism  - TSH  4. Benign hypertension  - COMPLETE METABOLIC PANEL WITH GFR - CBC  with Differential/Platelet - lisinopril (ZESTRIL) 40 MG tablet; Take 1 tablet (40 mg total) by mouth at bedtime.  Dispense: 90 tablet; Refill: 1 - amLODipine (NORVASC) 2.5 MG tablet; Take 1 tablet (2.5 mg total) by mouth at bedtime.  Dispense: 90 tablet; Refill: 0  5. Primary osteoarthritis of both knees   6. Low serum vitamin B12  - Vitamin B12  7. Chronic pain syndrome   8. Osteoporosis, post-menopausal   9. Mild protein-calorie malnutrition ([place])   10. Malignant neoplasm of upper-outer quadrant of left breast in female, estrogen receptor positive ([place])   11. Chronic bilateral low back pain with right-sided sciatica  - pregabalin (LYRICA) 75 MG capsule; Take 1 capsule (75 mg total) by mouth 3 (three) times daily.  Dispense: 270 capsule; Refill: 0 - tiZANidine (ZANAFLEX) 2 MG tablet; Take 1 tablet (2 mg total) by mouth every 8 (eight) hours as needed for muscle spasms.  Dispense: 90 tablet; Refill: 0  12. History of kyphoplasty   13. Senile purpura ([place])  Both arms, reassurance given

## 2021-01-06 ENCOUNTER — Other Ambulatory Visit: Payer: Self-pay

## 2021-01-06 ENCOUNTER — Ambulatory Visit: Payer: PPO | Admitting: Family Medicine

## 2021-01-06 ENCOUNTER — Ambulatory Visit (INDEPENDENT_AMBULATORY_CARE_PROVIDER_SITE_OTHER): Payer: PPO | Admitting: Family Medicine

## 2021-01-06 ENCOUNTER — Encounter: Payer: Self-pay | Admitting: Family Medicine

## 2021-01-06 VITALS — BP 160/96 | HR 97 | Temp 97.8°F | Resp 16 | Ht 61.0 in | Wt 165.0 lb

## 2021-01-06 DIAGNOSIS — Z170 Estrogen receptor positive status [ER+]: Secondary | ICD-10-CM

## 2021-01-06 DIAGNOSIS — M5441 Lumbago with sciatica, right side: Secondary | ICD-10-CM

## 2021-01-06 DIAGNOSIS — R809 Proteinuria, unspecified: Secondary | ICD-10-CM | POA: Insufficient documentation

## 2021-01-06 DIAGNOSIS — Z9889 Other specified postprocedural states: Secondary | ICD-10-CM

## 2021-01-06 DIAGNOSIS — E785 Hyperlipidemia, unspecified: Secondary | ICD-10-CM

## 2021-01-06 DIAGNOSIS — M170 Bilateral primary osteoarthritis of knee: Secondary | ICD-10-CM | POA: Insufficient documentation

## 2021-01-06 DIAGNOSIS — I10 Essential (primary) hypertension: Secondary | ICD-10-CM

## 2021-01-06 DIAGNOSIS — D692 Other nonthrombocytopenic purpura: Secondary | ICD-10-CM

## 2021-01-06 DIAGNOSIS — E441 Mild protein-calorie malnutrition: Secondary | ICD-10-CM | POA: Diagnosis not present

## 2021-01-06 DIAGNOSIS — C50412 Malignant neoplasm of upper-outer quadrant of left female breast: Secondary | ICD-10-CM

## 2021-01-06 DIAGNOSIS — E1169 Type 2 diabetes mellitus with other specified complication: Secondary | ICD-10-CM | POA: Diagnosis not present

## 2021-01-06 DIAGNOSIS — E039 Hypothyroidism, unspecified: Secondary | ICD-10-CM | POA: Diagnosis not present

## 2021-01-06 DIAGNOSIS — E538 Deficiency of other specified B group vitamins: Secondary | ICD-10-CM | POA: Insufficient documentation

## 2021-01-06 DIAGNOSIS — F331 Major depressive disorder, recurrent, moderate: Secondary | ICD-10-CM

## 2021-01-06 DIAGNOSIS — E1129 Type 2 diabetes mellitus with other diabetic kidney complication: Secondary | ICD-10-CM | POA: Insufficient documentation

## 2021-01-06 DIAGNOSIS — G8929 Other chronic pain: Secondary | ICD-10-CM | POA: Insufficient documentation

## 2021-01-06 DIAGNOSIS — M810 Age-related osteoporosis without current pathological fracture: Secondary | ICD-10-CM

## 2021-01-06 DIAGNOSIS — G894 Chronic pain syndrome: Secondary | ICD-10-CM | POA: Insufficient documentation

## 2021-01-06 LAB — POCT GLYCOSYLATED HEMOGLOBIN (HGB A1C): Hemoglobin A1C: 7.5 % — AB (ref 4.0–5.6)

## 2021-01-06 MED ORDER — SERTRALINE HCL 50 MG PO TABS: 50.0000 mg | ORAL_TABLET | Freq: Every day | ORAL | 0 refills | Status: DC

## 2021-01-06 MED ORDER — ATORVASTATIN CALCIUM 40 MG PO TABS
40.0000 mg | ORAL_TABLET | Freq: Every day | ORAL | 1 refills | Status: DC
Start: 2021-01-06 — End: 2021-11-15

## 2021-01-06 MED ORDER — LISINOPRIL 40 MG PO TABS: 40.0000 mg | ORAL_TABLET | Freq: Every day | ORAL | 1 refills | Status: DC

## 2021-01-06 MED ORDER — TIZANIDINE HCL 2 MG PO TABS: 2.0000 mg | ORAL_TABLET | Freq: Three times a day (TID) | ORAL | 0 refills | Status: DC | PRN

## 2021-01-06 MED ORDER — AMLODIPINE BESYLATE 2.5 MG PO TABS: 2.5000 mg | ORAL_TABLET | Freq: Every evening | ORAL | 0 refills | Status: DC

## 2021-01-06 MED ORDER — PREGABALIN 75 MG PO CAPS: 75.0000 mg | ORAL_CAPSULE | Freq: Three times a day (TID) | ORAL | 0 refills | Status: DC

## 2021-01-07 ENCOUNTER — Other Ambulatory Visit: Payer: PPO

## 2021-01-07 ENCOUNTER — Ambulatory Visit
Admission: RE | Admit: 2021-01-07 | Discharge: 2021-01-07 | Disposition: A | Discharge status: Home / Self Care | Payer: PPO | Source: Ambulatory Visit | Attending: Interventional Radiology | Admitting: Interventional Radiology

## 2021-01-07 DIAGNOSIS — Z9889 Other specified postprocedural states: Secondary | ICD-10-CM

## 2021-01-07 DIAGNOSIS — M4856XD Collapsed vertebra, not elsewhere classified, lumbar region, subsequent encounter for fracture with routine healing: Secondary | ICD-10-CM | POA: Diagnosis not present

## 2021-01-07 LAB — MICROALBUMIN / CREATININE URINE RATIO
Creatinine, Urine: 269 mg/dL (ref 20–275)
Microalb Creat Ratio: 30 mcg/mg creat — ABNORMAL HIGH (ref <30)
Microalb, Ur: 8 mg/dL

## 2021-01-07 LAB — CBC WITH DIFFERENTIAL/PLATELET
Absolute Monocytes: 360 cells/uL (ref 200–950)
Basophils Absolute: 58 cells/uL (ref 0–200)
Basophils Relative: 0.8 %
Eosinophils Absolute: 346 cells/uL (ref 15–500)
Eosinophils Relative: 4.8 %
HCT: 45.8 % — ABNORMAL HIGH (ref 35.0–45.0)
Hemoglobin: 14.9 g/dL (ref 11.7–15.5)
Lymphs Abs: 2117 cells/uL (ref 850–3900)
MCH: 27.4 pg (ref 27.0–33.0)
MCHC: 32.5 g/dL (ref 32.0–36.0)
MCV: 84.3 fL (ref 80.0–100.0)
MPV: 11.3 fL (ref 7.5–12.5)
Monocytes Relative: 5 %
Neutro Abs: 4320 cells/uL (ref 1500–7800)
Neutrophils Relative %: 60 %
Platelets: 233 10*3/uL (ref 140–400)
RBC: 5.43 10*6/uL — ABNORMAL HIGH (ref 3.80–5.10)
RDW: 14.5 % (ref 11.0–15.0)
Total Lymphocyte: 29.4 %
WBC: 7.2 10*3/uL (ref 3.8–10.8)

## 2021-01-07 LAB — COMPLETE METABOLIC PANEL WITH GFR
AG Ratio: 1.7 (calc) (ref 1.0–2.5)
ALT: 18 U/L (ref 6–29)
AST: 17 U/L (ref 10–35)
Albumin: 4.7 g/dL (ref 3.6–5.1)
Alkaline phosphatase (APISO): 103 U/L (ref 37–153)
BUN/Creatinine Ratio: 31 (calc) — ABNORMAL HIGH (ref 6–22)
BUN: 26 mg/dL — ABNORMAL HIGH (ref 7–25)
CO2: 28 mmol/L (ref 20–32)
Calcium: 9.8 mg/dL (ref 8.6–10.4)
Chloride: 105 mmol/L (ref 98–110)
Creat: 0.84 mg/dL (ref 0.60–0.88)
GFR, Est African American: 76 mL/min/{1.73_m2} (ref >=60)
GFR, Est Non African American: 65 mL/min/{1.73_m2} (ref >=60)
Globulin: 2.7 g/dL (calc) (ref 1.9–3.7)
Glucose, Bld: 157 mg/dL — ABNORMAL HIGH (ref 65–99)
Potassium: 4.1 mmol/L (ref 3.5–5.3)
Sodium: 142 mmol/L (ref 135–146)
Total Bilirubin: 0.9 mg/dL (ref 0.2–1.2)
Total Protein: 7.4 g/dL (ref 6.1–8.1)

## 2021-01-07 LAB — LIPID PANEL
Cholesterol: 195 mg/dL (ref <200)
HDL: 55 mg/dL (ref >=50)
LDL Cholesterol (Calc): 115 mg/dL (calc) — ABNORMAL HIGH
Non-HDL Cholesterol (Calc): 140 mg/dL (calc) — ABNORMAL HIGH (ref <130)
Total CHOL/HDL Ratio: 3.5 (calc) (ref <5.0)
Triglycerides: 133 mg/dL (ref <150)

## 2021-01-07 LAB — TSH: TSH: 3.59 mIU/L (ref 0.40–4.50)

## 2021-01-07 LAB — VITAMIN B12: Vitamin B-12: 541 pg/mL (ref 200–1100)

## 2021-01-07 NOTE — Progress Notes (Signed)
Chief Complaint: Patient was consulted remotely today (TeleHealth) for lumbar compression fractures at the request of Remedy Corporan K.    Referring Physician(s): Dr. Melrose Nakayama  History of Present Illness: Lindsey [name] is a 80 y.o. female two weeks s/p L2, L3 and L4 cement augmentation for symptomatic compression fractures.  We spoke via teleconference today for follow-up.  She reports that while her back pain has improved, the pain radiating down her right leg is no better and still prevents her from standing and being active.  She is quite distressed by this and tells me she has cried for the past 4 days because she still can't 'get up and go'.    She denies new onset bowel or bladder dysfunction.     Past Medical History:  Diagnosis Date  . Anxiety   . Arthritis   . Breast cancer ([place]) 1983   right breast cancer  . 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 Bursitis    R knee  . Closed fracture of left ulna and radius   . Complication of anesthesia   . Depression   . Diabetes mellitus type 2, controlled, without complications ([place]) 02/26/9389  . Diabetes mellitus without complication ([place])   . GERD (gastroesophageal reflux disease)    TUMS  . Hyperlipidemia   . Hypertension   . Hypothyroidism   . Osteoporosis   . 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 PONV (postoperative nausea and vomiting)   . Thyroid disease     Past Surgical History:  Procedure Laterality Date  . BREAST BIOPSY Left 11-04-15  . BREAST BIOPSY Left 10-10-07  . CATARACT EXTRACTION Bilateral 1990  . CHOLECYSTECTOMY    . IR KYPHO EA ADDL LEVEL THORACIC OR LUMBAR  12/25/2020  . IR KYPHO LUMBAR INC FX REDUCE BONE BX UNI/BIL CANNULATION INC/IMAGING  12/25/2020  . IR RADIOLOGIST EVAL & MGMT  12/17/2020  . IR VERTEBROPLASTY LUMBAR BX INC UNI/BIL INC/INJECT/IMAGING  12/25/2020  . MASTECTOMY Right 1983   Dr Myrle Sheng  .  MASTECTOMY Right 1983  . MASTECTOMY W/ SENTINEL NODE BIOPSY Left 12/12/2015   Procedure: MASTECTOMY WITH SENTINEL LYMPH NODE BIOPSY;  Surgeon: Christene Lye, MD;  Location: [facility];  Service: General;  Laterality: Left;  . ORIF ULNAR FRACTURE Left 12/11/2018   Procedure: OPEN REDUCTION INTERNAL FIXATION (ORIF) LEFT PROXIMAL ULNA FRACTURE AND  RADIAL HEAD REPLACEMENT;  Surgeon: Milly Jakob, MD;  Location: [place];  Service: Orthopedics;  Laterality: Left;  . RADIAL HEAD ARTHROPLASTY Left 12/11/2018   Procedure: OPEN REDUCTION INTERNAL FIXATION (ORIF) LEFT PROXIMAL ULNA FRACTURE AND  RADIAL HEAD REPLACEMENT;  Surgeon: Milly Jakob, MD;  Location: [place];  Service: Orthopedics;  Laterality: Left;    Allergies: Patient has no known allergies.  Medications: Prior to Admission medications   Medication Sig Start Date End Date Taking? Authorizing Provider  acetaminophen (TYLENOL) 325 MG tablet Take 2 tablets (650 mg total) by mouth every 6 (six) hours. 12/11/18   Milly Jakob, MD  amLODipine (NORVASC) 2.5 MG tablet Take 1 tablet (2.5 mg total) by mouth at bedtime. 01/06/21   [name], MD  atorvastatin (LIPITOR) 40 MG tablet Take 1 tablet (40 mg total) by mouth daily. 01/06/21   [name], MD  B-D UF III MINI PEN NEEDLES 31G X 5 MM MISC USE AS DIRECTED BY DOCTOR, USE TO INJECT INSULIN 06/09/20  [name], MD  blood glucose meter kit and supplies Dispense based on patient and insurance preference. Use up to four times daily as directed. (FOR ICD-10 E10.9, E11.9). 03/31/20   [name], MD  calcium-vitamin D (OSCAL WITH D) 500-200 MG-UNIT tablet Take 1 tablet by mouth daily. 646m calcium 800 units Vitamin D    [provider]  cholecalciferol (VITAMIN D) 1000 UNITS tablet Take by mouth.    [provider]  ketoconazole (NIZORAL) 2 % cream Apply topically 2 (two) times daily. 08/12/20   Sowles, KDrue Stager MD  Lancets  (ONETOUCH DELICA PLUS [id number] MISC USE TO TEST UP TO FOUR TIMES A DAY AS DIRECTED 03/31/20   [provider]  letrozole (FEMARA) 2.5 MG tablet Take 2.5 mg by mouth daily. 12/18/19   [provider]  levothyroxine (SYNTHROID) 112 MCG tablet Take 1 tablet (112 mcg total) by mouth daily. And half on Sundays 08/13/20   SSteele Sizer MD  lisinopril (ZESTRIL) 40 MG tablet Take 1 tablet (40 mg total) by mouth at bedtime. 01/06/21   SSteele Sizer MD  [facility] Network[facility] test strip USE TO TEST UP TO FOUR TIMES A DAY 07/15/20   SSteele Sizer MD  pregabalin (LYRICA) 75 MG capsule Take 1 capsule (75 mg total) by mouth 3 (three) times daily. 01/06/21   SSteele Sizer MD  sertraline (ZOLOFT) 50 MG tablet Take 1 tablet (50 mg total) by mouth daily. 01/06/21   SSteele Sizer MD  tiZANidine (ZANAFLEX) 2 MG tablet Take 1 tablet (2 mg total) by mouth every 8 (eight) hours as needed for muscle spasms. 01/06/21   SSteele Sizer MD  TRESIBA FLEXTOUCH 100 UNIT/ML FlexTouch Pen INJECT 10 UNITS INTO THE SKIN ONCE DAILY 12/11/20   SSteele Sizer MD     Family History  Problem Relation Age of Onset  . Kidney disease Mother   . Heart disease Father   . Stroke Sister   . Cancer Sister        breast  . Breast cancer Sister   . Stroke Brother   . Diabetes Brother   . Hypertension Brother   . Cancer Daughter 56      DCIS/lumpectomy/genetic negative  . Breast cancer Daughter     Social History   Socioeconomic History  . Marital status: Married    Spouse name: Not on file  . Number of children: 3  . Years of education: Not on file  . Highest education level: Not on file  Occupational History  . Occupation: Retired  Tobacco Use  . Smoking status: Never Smoker  . Smokeless tobacco: Never Used  . Tobacco comment: Non-smoker. Smoking cessation materials contraindicated  Vaping Use  . Vaping Use: Never used  Substance and Sexual Activity  . Alcohol use: No    Alcohol/week: 0.0  standard drinks  . Drug use: No  . Sexual activity: Never  Other Topics Concern  . Not on file  Social History Narrative  . Not on file   Social Determinants of Health   Financial Resource Strain: Low Risk   . Difficulty of Paying Living Expenses: Not hard at all  Food Insecurity: No Food Insecurity  . Worried About RCharity fundraiserin the Last Year: Never true  . Ran Out of Food in the Last Year: Never true  Transportation Needs: No Transportation Needs  . Lack of Transportation (Medical): No  . Lack of Transportation (Non-Medical): No  Physical Activity: Inactive  . Days of Exercise per Week:  0 days  . Minutes of Exercise per Session: 0 min  Stress: Stress Concern Present  . Feeling of Stress : Rather much  Social Connections: Moderately Isolated  . Frequency of Communication with Friends and Family: Three times a week  . Frequency of Social Gatherings with Friends and Family: Never  . Attends Religious Services: Never  . Active Member of Clubs or Organizations: No  . Attends Archivist Meetings: Never  . Marital Status: Married   Review of Systems  Review of Systems: A 12 point ROS discussed and pertinent positives are indicated in the HPI above.  All other systems are negative.  Physical Exam No direct physical exam was performed (except for noted visual exam findings with Video Visits).   Vital Signs: There were no vitals taken for this visit.  Imaging: MR LUMBAR SPINE WO CONTRAST  Result Date: 12/09/2020 CLINICAL DATA:  Chronic low back pain and lower body pain. Numbness in both legs. EXAM: MRI LUMBAR SPINE WITHOUT CONTRAST TECHNIQUE: Multiplanar, multisequence MR imaging of the lumbar spine was performed. No intravenous contrast was administered. COMPARISON:  09/17/2017 FINDINGS: Segmentation:  Standard Alignment:  Grade 1 anterolisthesis at L4-5 and L5-S1, unchanged Vertebrae: Compression fractures at L2, L3 and L4 are new compared to 09/17/2017. There  is moderate bone marrow edema with early osteonecrotic cleft formation at L3. At L2 there is slight retropulsion measuring 3 mm. The degree of height loss is greatest at L2, approximately 75%. Conus medullaris and cauda equina: Conus extends to the L1 level. Conus and cauda equina appear normal. Paraspinal and other soft tissues: Negative Disc levels: L1-L2: Small disc bulge with minimal retropulsion of L2. Mild spinal canal stenosis. Mild left neural foraminal stenosis. L2-L3: Normal disc space and facet joints. No spinal canal stenosis. No neural foraminal stenosis. L3-L4: Small disc bulge with mild facet hypertrophy. Disc bulges asymmetric to the right. Right lateral recess narrowing without central spinal canal stenosis. Moderate right neural foraminal stenosis. L4-L5: Large central disc protrusion, slightly increased. Severe facet hypertrophy. Progression of moderate spinal canal stenosis. Severe right neural foraminal stenosis. L5-S1: Severe facet hypertrophy. No spinal canal stenosis. Severe left neural foraminal stenosis. Visualized sacrum: Normal. IMPRESSION: 1. Compression fractures at L2, L3 and L4 are new compared to 09/17/2017 and likely subacute. 2. Progression of moderate spinal canal stenosis at L4-L5 with severe right neural foraminal stenosis. 3. Severe left L5-S1 neural foraminal stenosis, unchanged. 4. Moderate right L3-4 neural foraminal stenosis, unchanged. Electronically Signed   By: Ulyses Jarred M.D.   On: 12/09/2020 00:44   IR VERTEBROPLASTY LUMBAR BX INC UNI/BIL INC INJECT/IMAGING  Result Date: 12/25/2020 CLINICAL DATA:  80 year old female with persistent nonhealing subacute lumbar compression fractures at L2, L3 and L4. She remains highly symptomatic and presents for multilevel cement augmentation utilizing a combination of vertebroplasty and kyphoplasty. EXAM: FLUOROSCOPIC GUIDED VERTEBROPLASTY OF THE L2 VERTEBRAL BODY FLUOROSCOPIC GUIDED KYPHOPLASTY OF THE L3 VERTEBRAL BODY  FLUOROSCOPIC GUIDED KYPHOPLASTY OF THE L4 VERTEBRAL BODY COMPARISON:  MRI lumbar spine 12/08/2020 MEDICATIONS: As antibiotic prophylaxis, 2 g Ancef was ordered pre-procedure and administered intravenously within 1 hour of incision. ANESTHESIA/SEDATION: Moderate (conscious) sedation was employed during this procedure. A total of Versed mg and Fentanyl mcg was administered intravenously. Moderate Sedation Time: minutes. The patient's level of consciousness and vital signs were monitored continuously by radiology nursing throughout the procedure under my direct supervision. FLUOROSCOPY TIME:  9 minutes, 23 seconds (740.8 mGy) COMPLICATIONS: None immediate. TECHNIQUE: The procedure, risks (including but not limited  to bleeding, infection, organ damage), benefits, and alternatives were explained to the patient. Questions regarding the procedure were encouraged and answered. The patient understands and consents to the procedure. The patient was placed prone on the fluoroscopic table. The skin overlying the upper thoracic region was then prepped and draped in the usual sterile fashion. Maximal barrier sterile technique was utilized including caps, mask, sterile gowns, sterile gloves, sterile drape, hand hygiene and skin antiseptic. Intravenous Fentanyl and Versed were administered as conscious sedation during continuous cardiorespiratory monitoring by the radiology RN. L2 VERTEBROPLASTY The left pedicle at L2 was then infiltrated with 1% lidocaine followed by the advancement of a Kyphon trocar needle through the left pedicle into the posterior one-third of the vertebral body. Subsequently, the osteo drill was advanced to the anterior third of the vertebral body. The osteo drill was retracted. L3 KYPHOPLASTY The right pedicle at L3 was then infiltrated with 1% lidocaine followed by the advancement of a Kyphon trocar needle through the left pedicle into the posterior one-third of the vertebral body. Subsequently, the osteo  drill was advanced to the anterior third of the vertebral body. The osteo drill was retracted. Through the working cannula, a Kyphon inflatable bone tamp 15 x 3 was advanced and positioned with the distal marker approximately 5 mm from the anterior aspect of the cortex. Appropriate positioning was confirmed on the AP projection. At this time, the balloon was expanded using contrast via a Kyphon inflation syringe device via micro tubing. Inflations were continued until there was near apposition with the superior end plate. L4 KYPHOPLASTY The left pedicle at L4 was then infiltrated with 1% lidocaine followed by the advancement of a Kyphon trocar needle through the left pedicle into the posterior one-third of the vertebral body. Subsequently, the osteo drill was advanced to the anterior third of the vertebral body. The osteo drill was retracted. Through the working cannula, a Kyphon inflatable bone tamp 15 x 3 was advanced and positioned with the distal marker approximately 5 mm from the anterior aspect of the cortex. Appropriate positioning was confirmed on the AP projection. At this time, the balloon was expanded using contrast via a Kyphon inflation syringe device via micro tubing. Inflations were continued until there was near apposition with the superior end plate. At this time, methylmethacrylate mixture was reconstituted in the Kyphon bone mixing device system. This was then loaded into the delivery mechanism, attached to the Kyphon cement delivery device. Cement application was began at the L2 level. Cement was instilled in till there was adequate filling of the compressed vertebral body. Small amount of escape of cement through the inferior endplate defect and into the L2-L3 disc space. The balloons at L3 and L4 were deflated and removed followed by the instillation of methylmethacrylate mixture with excellent filling in the AP and lateral projections. No extravasation was noted in the disk spaces or  posteriorly into the spinal canal. No epidural venous contamination was seen. The working cannulae and the bone filler were then retrieved and removed. Hemostasis was achieved with manual compression. The patient tolerated the procedure well without immediate postprocedural complication. IMPRESSION: 1. Technically successful L2 vertebral body augmentation. 2. Technically successful L3 vertebral body augmentation using balloon kyphoplasty. 3. Technically successful L4 vertebral body augmentation using balloon kyphoplasty. 4. Per CMS PQRS reporting requirements (PQRS Measure 24): Given the patient's age of greater than 1 and the fracture site (hip, distal radius, or spine), the patient should be tested for osteoporosis using DXA, and the appropriate treatment  considered based on the DXA results. Electronically Signed   By: Jacqulynn Cadet M.D.   On: 12/25/2020 11:55   IR KYPHO LUMBAR INC FX REDUCE BONE BX UNI/BIL CANNULATION INC/IMAGING  Result Date: 12/25/2020 CLINICAL DATA:  80 year old female with persistent nonhealing subacute lumbar compression fractures at L2, L3 and L4. She remains highly symptomatic and presents for multilevel cement augmentation utilizing a combination of vertebroplasty and kyphoplasty. EXAM: FLUOROSCOPIC GUIDED VERTEBROPLASTY OF THE L2 VERTEBRAL BODY FLUOROSCOPIC GUIDED KYPHOPLASTY OF THE L3 VERTEBRAL BODY FLUOROSCOPIC GUIDED KYPHOPLASTY OF THE L4 VERTEBRAL BODY COMPARISON:  MRI lumbar spine 12/08/2020 MEDICATIONS: As antibiotic prophylaxis, 2 g Ancef was ordered pre-procedure and administered intravenously within 1 hour of incision. ANESTHESIA/SEDATION: Moderate (conscious) sedation was employed during this procedure. A total of Versed mg and Fentanyl mcg was administered intravenously. Moderate Sedation Time: minutes. The patient's level of consciousness and vital signs were monitored continuously by radiology nursing throughout the procedure under my direct supervision. FLUOROSCOPY  TIME:  9 minutes, 23 seconds (295.2 mGy) COMPLICATIONS: None immediate. TECHNIQUE: The procedure, risks (including but not limited to bleeding, infection, organ damage), benefits, and alternatives were explained to the patient. Questions regarding the procedure were encouraged and answered. The patient understands and consents to the procedure. The patient was placed prone on the fluoroscopic table. The skin overlying the upper thoracic region was then prepped and draped in the usual sterile fashion. Maximal barrier sterile technique was utilized including caps, mask, sterile gowns, sterile gloves, sterile drape, hand hygiene and skin antiseptic. Intravenous Fentanyl and Versed were administered as conscious sedation during continuous cardiorespiratory monitoring by the radiology RN. L2 VERTEBROPLASTY The left pedicle at L2 was then infiltrated with 1% lidocaine followed by the advancement of a Kyphon trocar needle through the left pedicle into the posterior one-third of the vertebral body. Subsequently, the osteo drill was advanced to the anterior third of the vertebral body. The osteo drill was retracted. L3 KYPHOPLASTY The right pedicle at L3 was then infiltrated with 1% lidocaine followed by the advancement of a Kyphon trocar needle through the left pedicle into the posterior one-third of the vertebral body. Subsequently, the osteo drill was advanced to the anterior third of the vertebral body. The osteo drill was retracted. Through the working cannula, a Kyphon inflatable bone tamp 15 x 3 was advanced and positioned with the distal marker approximately 5 mm from the anterior aspect of the cortex. Appropriate positioning was confirmed on the AP projection. At this time, the balloon was expanded using contrast via a Kyphon inflation syringe device via micro tubing. Inflations were continued until there was near apposition with the superior end plate. L4 KYPHOPLASTY The left pedicle at L4 was then infiltrated with  1% lidocaine followed by the advancement of a Kyphon trocar needle through the left pedicle into the posterior one-third of the vertebral body. Subsequently, the osteo drill was advanced to the anterior third of the vertebral body. The osteo drill was retracted. Through the working cannula, a Kyphon inflatable bone tamp 15 x 3 was advanced and positioned with the distal marker approximately 5 mm from the anterior aspect of the cortex. Appropriate positioning was confirmed on the AP projection. At this time, the balloon was expanded using contrast via a Kyphon inflation syringe device via micro tubing. Inflations were continued until there was near apposition with the superior end plate. At this time, methylmethacrylate mixture was reconstituted in the Kyphon bone mixing device system. This was then loaded into the delivery mechanism, attached to  the Kyphon cement delivery device. Cement application was began at the L2 level. Cement was instilled in till there was adequate filling of the compressed vertebral body. Small amount of escape of cement through the inferior endplate defect and into the L2-L3 disc space. The balloons at L3 and L4 were deflated and removed followed by the instillation of methylmethacrylate mixture with excellent filling in the AP and lateral projections. No extravasation was noted in the disk spaces or posteriorly into the spinal canal. No epidural venous contamination was seen. The working cannulae and the bone filler were then retrieved and removed. Hemostasis was achieved with manual compression. The patient tolerated the procedure well without immediate postprocedural complication. IMPRESSION: 1. Technically successful L2 vertebral body augmentation. 2. Technically successful L3 vertebral body augmentation using balloon kyphoplasty. 3. Technically successful L4 vertebral body augmentation using balloon kyphoplasty. 4. Per CMS PQRS reporting requirements (PQRS Measure 24): Given the  patient's age of greater than 37 and the fracture site (hip, distal radius, or spine), the patient should be tested for osteoporosis using DXA, and the appropriate treatment considered based on the DXA results. Electronically Signed   By: Jacqulynn Cadet M.D.   On: 12/25/2020 11:55   IR KYPHO EA ADDL LEVEL THORACIC OR LUMBAR  Result Date: 12/25/2020 CLINICAL DATA:  80 year old female with persistent nonhealing subacute lumbar compression fractures at L2, L3 and L4. She remains highly symptomatic and presents for multilevel cement augmentation utilizing a combination of vertebroplasty and kyphoplasty. EXAM: FLUOROSCOPIC GUIDED VERTEBROPLASTY OF THE L2 VERTEBRAL BODY FLUOROSCOPIC GUIDED KYPHOPLASTY OF THE L3 VERTEBRAL BODY FLUOROSCOPIC GUIDED KYPHOPLASTY OF THE L4 VERTEBRAL BODY COMPARISON:  MRI lumbar spine 12/08/2020 MEDICATIONS: As antibiotic prophylaxis, 2 g Ancef was ordered pre-procedure and administered intravenously within 1 hour of incision. ANESTHESIA/SEDATION: Moderate (conscious) sedation was employed during this procedure. A total of Versed mg and Fentanyl mcg was administered intravenously. Moderate Sedation Time: minutes. The patient's level of consciousness and vital signs were monitored continuously by radiology nursing throughout the procedure under my direct supervision. FLUOROSCOPY TIME:  9 minutes, 23 seconds (536.4 mGy) COMPLICATIONS: None immediate. TECHNIQUE: The procedure, risks (including but not limited to bleeding, infection, organ damage), benefits, and alternatives were explained to the patient. Questions regarding the procedure were encouraged and answered. The patient understands and consents to the procedure. The patient was placed prone on the fluoroscopic table. The skin overlying the upper thoracic region was then prepped and draped in the usual sterile fashion. Maximal barrier sterile technique was utilized including caps, mask, sterile gowns, sterile gloves, sterile drape,  hand hygiene and skin antiseptic. Intravenous Fentanyl and Versed were administered as conscious sedation during continuous cardiorespiratory monitoring by the radiology RN. L2 VERTEBROPLASTY The left pedicle at L2 was then infiltrated with 1% lidocaine followed by the advancement of a Kyphon trocar needle through the left pedicle into the posterior one-third of the vertebral body. Subsequently, the osteo drill was advanced to the anterior third of the vertebral body. The osteo drill was retracted. L3 KYPHOPLASTY The right pedicle at L3 was then infiltrated with 1% lidocaine followed by the advancement of a Kyphon trocar needle through the left pedicle into the posterior one-third of the vertebral body. Subsequently, the osteo drill was advanced to the anterior third of the vertebral body. The osteo drill was retracted. Through the working cannula, a Kyphon inflatable bone tamp 15 x 3 was advanced and positioned with the distal marker approximately 5 mm from the anterior aspect of the cortex. Appropriate positioning was  confirmed on the AP projection. At this time, the balloon was expanded using contrast via a Kyphon inflation syringe device via micro tubing. Inflations were continued until there was near apposition with the superior end plate. L4 KYPHOPLASTY The left pedicle at L4 was then infiltrated with 1% lidocaine followed by the advancement of a Kyphon trocar needle through the left pedicle into the posterior one-third of the vertebral body. Subsequently, the osteo drill was advanced to the anterior third of the vertebral body. The osteo drill was retracted. Through the working cannula, a Kyphon inflatable bone tamp 15 x 3 was advanced and positioned with the distal marker approximately 5 mm from the anterior aspect of the cortex. Appropriate positioning was confirmed on the AP projection. At this time, the balloon was expanded using contrast via a Kyphon inflation syringe device via micro tubing. Inflations  were continued until there was near apposition with the superior end plate. At this time, methylmethacrylate mixture was reconstituted in the Kyphon bone mixing device system. This was then loaded into the delivery mechanism, attached to the Kyphon cement delivery device. Cement application was began at the L2 level. Cement was instilled in till there was adequate filling of the compressed vertebral body. Small amount of escape of cement through the inferior endplate defect and into the L2-L3 disc space. The balloons at L3 and L4 were deflated and removed followed by the instillation of methylmethacrylate mixture with excellent filling in the AP and lateral projections. No extravasation was noted in the disk spaces or posteriorly into the spinal canal. No epidural venous contamination was seen. The working cannulae and the bone filler were then retrieved and removed. Hemostasis was achieved with manual compression. The patient tolerated the procedure well without immediate postprocedural complication. IMPRESSION: 1. Technically successful L2 vertebral body augmentation. 2. Technically successful L3 vertebral body augmentation using balloon kyphoplasty. 3. Technically successful L4 vertebral body augmentation using balloon kyphoplasty. 4. Per CMS PQRS reporting requirements (PQRS Measure 24): Given the patient's age of greater than 54 and the fracture site (hip, distal radius, or spine), the patient should be tested for osteoporosis using DXA, and the appropriate treatment considered based on the DXA results. Electronically Signed   By: Jacqulynn Cadet M.D.   On: 12/25/2020 11:55   IR Radiologist Eval & Mgmt  Result Date: 12/17/2020 Please refer to notes tab for details about interventional procedure. (Op Note)   Labs:  CBC: Recent Labs    09/12/20 1802 12/19/20 1020 01/06/21 1131  WBC 2.9* 4.6 7.2  HGB 12.4 13.0 14.9  HCT 39.4 40.7 45.8*  PLT 150 208 233    COAGS: No results for input(s):  INR, APTT in the last 8760 hours.  BMP: Recent Labs    03/31/20 1252 09/12/20 1802 01/06/21 1131  NA 141 137 142  K 4.4 4.0 4.1  CL 104 100 105  CO2 _0 GLUCOSE 250* 244* 157*  BUN 20 18 26*  CALCIUM 9.4 8.9 9.8  CREATININE 0.92* 0.77 0.84  GFRNONAA  --  >60 65  GFRAA  --   --  76    LIVER FUNCTION TESTS: Recent Labs    01/06/21 1131  BILITOT 0.9  AST 17  ALT 18  PROT 7.4    TUMOR MARKERS: No results for input(s): AFPTM, CEA, CA199, CHROMGRNA in the last 8760 hours.  Assessment and Plan:  80 year-old female 2 weeks s/p L2, L3 and L4 cement augmentation for compression fractures.  While her lower back  pain has resolved, she continues to have severe right lower extremity radiculopathy.  She has an appointment with Dr. [name] this coming Monday.   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  She would likely benefit from right L4 and L5 selective nerve root blockade vs ESI.   I'm pleased that her lumbar pain is largely resolved.     Electronically Signed: Criselda Peaches 01/07/2021, 2:13 PM   I spent a total of 10 Minutes in remote  clinical consultation, greater than 50% of which was counseling/coordinating care for lumbar spine compression fractures.    Visit type: Audio only (telephone). Audio (no video) only due to patient's lack of internet/smartphone capability. Alternative for in-person consultation at [facility], [address], [place], [place]. This visit type was conducted due to national recommendations for restrictions regarding the COVID-19 Pandemic (e.g. social distancing).  This format is felt to be most appropriate for this patient at this time.  All issues noted in this document were discussed and addressed.

## 2021-01-12 ENCOUNTER — Telehealth: Payer: Self-pay

## 2021-01-12 DIAGNOSIS — M5416 Radiculopathy, lumbar region: Secondary | ICD-10-CM | POA: Diagnosis not present

## 2021-01-12 NOTE — Progress Notes (Signed)
Chronic Care Management Pharmacy Assistant   Name: Lindsey [name]  MRN: [id number] DOB: [birth year]  Reason for Encounter:Hypertension and Diabetes Disease State Call.   Recent office visits:  01/06/2021 Dr.Sowles MD (PCP) - start sertraline (ZOLOFT) 50 MG tablet; Take 1 tablet (50 mg total) by mouth daily. Restarted tiZANidine (ZANAFLEX) 2 MG tablet; Take 1 tablet (2 mg total) by mouth every 8 (eight) hours as needed for muscle spasms.Start amLODipine (NORVASC) 2.5 MG tablet; Take 1 tablet (2.5 mg total) by mouth at bedtime.  12/23/2020 [name] LPN (PCP Office)  78/29/5621 Dr.Sowles MD (PCP) - patient stop all medication for depression per note  Recent consult visits:  11/15/2020 [facility] Nakayama (Orthopedic Surgery)  10/23/2020 [facility] Corner (Gastroenterology)  10/22/2020 [facility] (Orthopedic Surgery)   Hospital visits:  None in previous 6 months  Medications: Outpatient Encounter Medications as of 01/12/2021  Medication Sig  . acetaminophen (TYLENOL) 325 MG tablet Take 2 tablets (650 mg total) by mouth every 6 (six) hours.  [name] amLODipine (NORVASC) 2.5 MG tablet Take 1 tablet (2.5 mg total) by mouth at bedtime.  [name] atorvastatin (LIPITOR) 40 MG tablet Take 1 tablet (40 mg total) by mouth daily.  . B-D UF III MINI PEN NEEDLES 31G X 5 MM MISC USE AS DIRECTED BY DOCTOR, USE TO INJECT INSULIN  . blood glucose meter kit and supplies Dispense based on patient and insurance preference. Use up to four times daily as directed. (FOR ICD-10 E10.9, E11.9).  . calcium-vitamin D (OSCAL WITH D) 500-200 MG-UNIT tablet Take 1 tablet by mouth daily. 654m calcium 800 units Vitamin D  . cholecalciferol (VITAMIN D) 1000 UNITS tablet Take by mouth.  .Marland Kitchenketoconazole (NIZORAL) 2 % cream Apply topically 2 (two) times daily.  . Lancets (ONETOUCH DELICA PLUS [id number] MISC USE TO TEST UP TO FOUR TIMES A DAY AS DIRECTED  . letrozole (FEMARA) 2.5 MG tablet Take 2.5 mg by mouth daily.  .Marland Kitchenlevothyroxine  (SYNTHROID) 112 MCG tablet Take 1 tablet (112 mcg total) by mouth daily. And half on Sundays  . lisinopril (ZESTRIL) 40 MG tablet Take 1 tablet (40 mg total) by mouth at bedtime.  .Glory RosebushULTRA test strip USE TO TEST UP TO FOUR TIMES A DAY  . pregabalin (LYRICA) 75 MG capsule Take 1 capsule (75 mg total) by mouth 3 (three) times daily.  . sertraline (ZOLOFT) 50 MG tablet Take 1 tablet (50 mg total) by mouth daily.  .Marland KitchentiZANidine (ZANAFLEX) 2 MG tablet Take 1 tablet (2 mg total) by mouth every 8 (eight) hours as needed for muscle spasms.  . TRESIBA FLEXTOUCH 100 UNIT/ML FlexTouch Pen INJECT 10 UNITS INTO THE SKIN ONCE DAILY   No facility-administered encounter medications on file as of 01/12/2021.   Star Rating Drugs: Atorvastatin 40 mg [facility] 40 mg [facility]  Reviewed chart prior to disease state call. Spoke with patient regarding BP  Recent Office Vitals: BP Readings from Last 3 Encounters:  01/06/21 (!) 160/96  12/25/20 (!) 169/97  09/12/20 (!) 184/83   Pulse Readings from Last 3 Encounters:  01/06/21 97  12/25/20 76  09/12/20 89    Wt Readings from Last 3 Encounters:  01/06/21 165 lb (74.8 kg)  12/18/20 180 lb (81.6 kg)  10/29/20 185 lb (83.9 kg)     Kidney Function Lab Results  Component Value Date/Time   CREATININE 0.84  01/06/2021 11:31 AM   CREATININE 0.77 09/12/2020 06:02 PM   CREATININE 0.92 (H) 03/31/2020 12:52 PM   GFRNONAA 65 01/06/2021 11:31 AM   GFRAA 76 01/06/2021 11:31 AM    BMP Latest Ref Rng & Units 01/06/2021 09/12/2020 03/31/2020  Glucose 65 - 99 mg/dL 157(H) 244(H) 250(H)  BUN 7 - 25 mg/dL 26(H) 18 20  Creatinine 0.60 - 0.88 mg/dL 0.84 0.77 0.92(H)  BUN/Creat Ratio 6 - 22 (calc) 31(H) - 22  Sodium 135 - 146 mmol/L 142 137 141  Potassium 3.5 - 5.3 mmol/L 4.1 4.0 4.4  Chloride 98 - 110 mmol/L 105 100 104  CO2 20 - 32 mmol/L  '28 27 29  ' Calcium 8.6 - 10.4 mg/dL 9.8 8.9 9.4    . Current antihypertensive regimen:  o Lisinopril 40 mg 1 tablet daily at bedtime  Patient reports dizziness first thing in the morning when she wakes up.Patient states her dizziness goes away and doesn't last that long. . How often are you checking your Blood Pressure? Patient states she does not check her blood pressure at home. . Current home BP readings: None ID . What recent interventions/DTPs have been made by any provider to improve Blood Pressure control since last CPP Visit:  o Patient states she has not Started Amlodipine 2.5 mg daily . Any recent hospitalizations or ED visits since last visit with CPP? No . What diet changes have been made to improve Blood Pressure Control?  o None ID . What exercise is being done to improve your Blood Pressure Control?  o Patient states she is not able to exercise at this time.  Adherence Review: Is the patient currently on ACE/ARB medication? Yes Does the patient have >5 day gap between last estimated fill dates? No   Recent Relevant Labs: Lab Results  Component Value Date/Time   HGBA1C 7.5 (A) 01/06/2021 10:52 AM   HGBA1C 6.7 (A) 06/23/2020 11:05 AM   HGBA1C 11.8 (H) 12/19/2019 11:34 AM   HGBA1C 7.8 (H) 11/27/2018 11:21 AM   MICROALBUR 8.0 01/06/2021 11:31 AM   MICROALBUR 0.7 12/26/2019 11:33 AM   MICROALBUR NEG 07/01/2016 10:10 AM    Kidney Function Lab Results  Component Value Date/Time   CREATININE 0.84 01/06/2021 11:31 AM   CREATININE 0.77 09/12/2020 06:02 PM   CREATININE 0.92 (H) 03/31/2020 12:52 PM   GFRNONAA 65 01/06/2021 11:31 AM   GFRAA 76 01/06/2021 11:31 AM    . Current antihyperglycemic regimen:  ? Tresiba 10 units at bedtime . What recent interventions/DTPs have been made to improve glycemic control:  o None ID . Have there been any recent hospitalizations or ED visits since last visit with CPP? No . Patient denies hypoglycemic symptoms, including Pale,  Sweaty, Shaky, Hungry, Nervous/irritable and Vision changes . Patient denies hyperglycemic symptoms, including blurry vision, excessive thirst, polyuria and weakness . How often are you checking your blood sugar? Infrequently  . What are your blood sugars ranging?  o Patient states her blood sugar can be over 200 , under 200.Patient states she does not have her numbers. . During the week, how often does your blood glucose drop below 70? Never . Are you checking your feet daily/regularly?   Patient denies numbness, pain or tingling sensation in her feet.  Adherence Review: Is the patient currently on a STATIN medication? Yes Is the patient currently on ACE/ARB medication? Yes Does the patient have >5 day gap between last estimated fill dates? No   Schedule patient a telephone follow up  in 02/04/2021 at 11:00 am with the clinical pharmacist. Sent message to scheduler.   [place] Pharmacist Assistant [phone]

## 2021-01-13 NOTE — Progress Notes (Signed)
Per patient , please call [phone] for her follow up that is schedule with clinical pharmacist.

## 2021-01-26 DIAGNOSIS — L03115 Cellulitis of right lower limb: Secondary | ICD-10-CM | POA: Diagnosis not present

## 2021-02-03 ENCOUNTER — Telehealth: Payer: Self-pay

## 2021-02-03 NOTE — Progress Notes (Signed)
Spoke to patient to confirmed patient telephone  appointment on 02/04/2021 for CCM at 11:00 am with [name] the Clinical pharmacist.   Questions: Have you had any recent office visit or specialist visit outside of [facility]?No Are there any concerns you would like to discuss during your office visit?NO  Star Rating Drug: Atorvastatin 40 mg last filled on 11/17/2020 for 90 day supply at [facility]. Lisinopril 40 mg last filled on 01/06/2021 for 90 day supply at [facility].  Per patient , please call [phone] for her follow up that is schedule with clinical pharmacist.   Anderson [place] Clinical Pharmacist Assistant [phone]

## 2021-02-04 ENCOUNTER — Ambulatory Visit (INDEPENDENT_AMBULATORY_CARE_PROVIDER_SITE_OTHER): Payer: PPO

## 2021-02-04 DIAGNOSIS — E785 Hyperlipidemia, unspecified: Secondary | ICD-10-CM | POA: Diagnosis not present

## 2021-02-04 DIAGNOSIS — R809 Proteinuria, unspecified: Secondary | ICD-10-CM

## 2021-02-04 DIAGNOSIS — E1169 Type 2 diabetes mellitus with other specified complication: Secondary | ICD-10-CM

## 2021-02-04 DIAGNOSIS — E1129 Type 2 diabetes mellitus with other diabetic kidney complication: Secondary | ICD-10-CM

## 2021-02-04 DIAGNOSIS — E1159 Type 2 diabetes mellitus with other circulatory complications: Secondary | ICD-10-CM

## 2021-02-04 DIAGNOSIS — Z794 Long term (current) use of insulin: Secondary | ICD-10-CM

## 2021-02-04 DIAGNOSIS — I152 Hypertension secondary to endocrine disorders: Secondary | ICD-10-CM

## 2021-02-04 NOTE — Progress Notes (Signed)
Chronic Care Management Pharmacy Note  03/04/2021 Name:  Lindsey [name] MRN:  [id number] DOB:  26-Nov-1940  Summary: Patient presents today for follow-up. She continues to have significant pain in her legs and back which limits her mobility and quality of life. Her blood sugars are relatively controlled although she does state her numbers run high after she eats.   Recommendations/Changes made from today's visit: Continue current medications  Plan: CPP follow-up in 6 months   Subjective: Lindsey [name] is an 80 y.o. year old female who is a primary patient of [name], MD.  The CCM team was consulted for assistance with disease management and care coordination needs.    Engaged with patient by telephone for follow up visit in response to provider referral for pharmacy case management and/or care coordination services.   Consent to Services:  The patient was given information about Chronic Care Management services, agreed to services, and gave verbal consent prior to initiation of services.  Please see initial visit note for detailed documentation.   Patient Care Team: [name], MD as PCP - General (Family Medicine) Melrose Nakayama, MD as Consulting Physician (Orthopedic Surgery) [name], MD as Attending Physician (Family Medicine) Germaine Pomfret, [facility] as Pharmacist (Pharmacist)  Recent office visits: 01/06/21: Patient presented to Dr. Ancil Boozer for follow-up. BP 160/96. A1c worsened to 7.5%. Amlodipine 2.5 mg nightly. Sertraline 50 mg daily. Pregabalin increased to three times daily. Metformin stopped. Tramadol stopped.  12/23/20: Patient presented to [name], LPN for AWV.  10/23/41: Video visit with Dr. Ancil Boozer for follow-up. Naproxen, ofloxacin, penicillin, pregabalin, sertraline stopped.   Recent consult visits: None in previous 6 months  Hospital visits: None in previous 6 months   Objective:  Lab Results  Component Value Date   CREATININE 0.84  01/06/2021   BUN 26 (H) 01/06/2021   GFRNONAA 65 01/06/2021   GFRAA 76 01/06/2021   NA 142 01/06/2021   K 4.1 01/06/2021   CALCIUM 9.8 01/06/2021   CO2 28 01/06/2021   GLUCOSE 157 (H) 01/06/2021    Lab Results  Component Value Date/Time   HGBA1C 7.5 (A) 01/06/2021 10:52 AM   HGBA1C 6.7 (A) 06/23/2020 11:05 AM   HGBA1C 11.8 (H) 12/19/2019 11:34 AM   HGBA1C 7.8 (H) 11/27/2018 11:21 AM   MICROALBUR 8.0 01/06/2021 11:31 AM   MICROALBUR 0.7 12/26/2019 11:33 AM   MICROALBUR NEG 07/01/2016 10:10 AM    Last diabetic Eye exam: No results found for: HMDIABEYEEXA  Last diabetic Foot exam: No results found for: HMDIABFOOTEX   Lab Results  Component Value Date   CHOL 195 01/06/2021   HDL 55 01/06/2021   LDLCALC 115 (H) 01/06/2021   TRIG 133 01/06/2021   CHOLHDL 3.5 01/06/2021    Hepatic Function Latest Ref Rng & Units 01/06/2021 12/19/2019 11/27/2018  Total Protein 6.1 - 8.1 g/dL 7.4 6.8 6.7  Albumin 3.6 - 5.1 g/dL - - -  AST 10 - 35 U/L _0 ALT 6 - 29 U/L _1 Alk Phosphatase 33 - 130 U/L - - -  Total Bilirubin 0.2 - 1.2 mg/dL 0.9 0.8 0.8    Lab Results  Component Value Date/Time   TSH 3.59 01/06/2021 11:31 AM   TSH 0.92 08/12/2020 10:41 AM    CBC Latest Ref Rng & Units 01/06/2021 12/19/2020 09/12/2020  WBC 3.8 - 10.8 Thousand/uL 7.2 4.6 2.9(L)  Hemoglobin 11.7 - 15.5 g/dL 14.9 13.0 12.4  Hematocrit 35.0 - 45.0 % 45.8(H)  40.7 39.4  Platelets 140 - 400 Thousand/uL 233 208 150    Lab Results  Component Value Date/Time   VD25OH 40 11/24/2017 10:57 AM   VD25OH 42 09/30/2016 10:07 AM    Clinical ASCVD: No  The ASCVD Risk score [name] DC Jr., et al., 2013) failed to calculate for the following reasons:   The 2013 ASCVD risk score is only valid for ages 29 to 60    Depression screen PHQ 2/9 01/06/2021 12/23/2020 10/29/2020  Decreased Interest _0 Down, Depressed, Hopeless _1 PHQ - 2 Score _2 Altered sleeping 3 0 3  Tired, decreased energy _3 Change in  appetite _4 Feeling bad or failure about yourself  3 0 0  Trouble concentrating 3 0 0  Moving slowly or fidgety/restless 1 0 0  Suicidal thoughts 3 0 0  PHQ-9 Score _5 Difficult doing work/chores - Somewhat difficult Somewhat difficult  Some recent data might be hidden    Social History   Tobacco Use  Smoking Status Never  Smokeless Tobacco Never  Tobacco Comments   Non-smoker. Smoking cessation materials contraindicated   BP Readings from Last 3 Encounters:  01/06/21 (!) 160/96  12/25/20 (!) 169/97  09/12/20 (!) 184/83   Pulse Readings from Last 3 Encounters:  01/06/21 97  12/25/20 76  09/12/20 89   Wt Readings from Last 3 Encounters:  01/06/21 165 lb (74.8 kg)  12/18/20 180 lb (81.6 kg)  10/29/20 185 lb (83.9 kg)   BMI Readings from Last 3 Encounters:  01/06/21 31.18 kg/m  12/18/20 34.01 kg/m  10/29/20 34.96 kg/m   -Last DEXA Scan: 04/30/2020    T-Score femoral neck: -2.7  T-Score total hip: -2.6  T-Score lumbar spine: -4.6  Assessment/Interventions: Review of patient past medical history, allergies, medications, health status, including review of consultants reports, laboratory and other test data, was performed as part of comprehensive evaluation and provision of chronic care management services.   SDOH:  (Social Determinants of Health) assessments and interventions performed: Yes SDOH Interventions    Flowsheet Row Most Recent Value  SDOH Interventions   Financial Strain Interventions Intervention Not Indicated      SDOH Screenings   Alcohol Screen: Low Risk    Last Alcohol Screening Score (AUDIT): 0  Depression (PHQ2-9): Medium Risk   PHQ-2 Score: 25  Financial Resource Strain: Low Risk    Difficulty of Paying Living Expenses: Not hard at all  Food Insecurity: No Food Insecurity   Worried About Charity fundraiser in the Last Year: Never true   Ran Out of Food in the Last Year: Never true  Housing: Low Risk    Last Housing Risk Score:  0  Physical Activity: Inactive   Days of Exercise per Week: 0 days   Minutes of Exercise per Session: 0 min  Social Connections: Moderately Isolated   Frequency of Communication with Friends and Family: Three times a week   Frequency of Social Gatherings with Friends and Family: Never   Attends Religious Services: Never   Marine scientist or Organizations: No   Attends Music therapist: Never   Marital Status: Married  Stress: Stress Concern Present   Feeling of Stress : Rather much  Tobacco Use: Low Risk    Smoking Tobacco Use: Never   Smokeless Tobacco Use: Never  Transportation Needs: No Transportation Needs   Lack of Transportation (Medical): No  Lack of Transportation (Non-Medical): No    CCM Care Plan  No Known Allergies  Medications Reviewed Today     Reviewed by [name], MD (Physician) on 01/06/21 at 1117  Med List Status: <None>   Medication Order Taking? Sig Documenting Provider Last Dose Status Informant  acetaminophen (TYLENOL) 325 MG tablet [id number] Yes Take 2 tablets (650 mg total) by mouth every 6 (six) hours. Milly Jakob, MD Taking Active   atorvastatin (LIPITOR) 40 MG tablet [id number] Yes Take 1 tablet (40 mg total) by mouth daily. [name], MD Taking Active   B-D UF III MINI PEN NEEDLES 31G X 5 MM MISC [id number] Yes USE AS DIRECTED BY DOCTOR, USE TO INJECT INSULIN [name], MD Taking Active   blood glucose meter kit and supplies [id number] Yes Dispense based on patient and insurance preference. Use up to four times daily as directed. (FOR ICD-10 E10.9, E11.9). [name], MD Taking Active   calcium-vitamin D (OSCAL WITH D) 500-200 MG-UNIT tablet [id number] Yes Take 1 tablet by mouth daily. 651m calcium 800 units Vitamin D [provider] Taking Active   cholecalciferol (VITAMIN D) 1000 UNITS tablet [id number] Take by mouth. [provider] Taking Active            Med Note (Gloris Ham KRoswell MinersD    Tue Dec 04, 2019  2:26 PM)    ketoconazole (NIZORAL) 2 % cream [id number] Apply topically 2 (two) times daily. SSteele Sizer MD Taking Active   Lancets (ONETOUCH DELICA PLUS [id number] [id number] USE TO TEST UP TO FOUR TIMES A DAY AS DIRECTED [provider] Taking Active   letrozole ([facility] 2.5 MG tablet [id number] Take 2.5 mg by mouth daily. [provider] Taking Active   levothyroxine (SYNTHROID) 112 MCG tablet [id number] Take 1 tablet (112 mcg total) by mouth daily. And half on Sundays SSteele Sizer MD Taking Active   lisinopril (ZESTRIL) 40 MG tablet [id number] Take 1 tablet (40 mg total) by mouth at bedtime. SSteele Sizer MD Taking Active   Patient not taking:  Discontinued 01/06/21 1112 (Discontinued by provider)   ODonald Sivatest strip [id number] USE TO TEST UP TO FOUR TIMES A DEloise Harman MD Taking Active   pregabalin (LYRICA) 75 MG capsule [id number] TAKE 1 CAPSULE(75 MG) BY MOUTH TWICE DAILY Sowles, KDrue Stager MD Taking Active   tiZANidine (ZANAFLEX) 2 MG tablet [id number] 1 tablet as needed [provider] Taking Active   Patient not taking:  Discontinued 01/06/21 1110 (Completed Course)   TRESIBA FLEXTOUCH 100 UNIT/ML FlexTouch Pen [id number] INJECT 10 UNITS INTO THE SKIN ONCE DAILY SSteele Sizer MD Taking Active             Patient Active Problem List   Diagnosis Date Noted   Primary osteoarthritis of both knees 01/06/2021   Low serum vitamin B12 01/06/2021   Chronic bilateral low back pain with right-sided sciatica 01/06/2021   Dyslipidemia associated with type 2 diabetes mellitus ([place] 01/06/2021   Mild protein-calorie malnutrition ([place] 01/06/2021   Chronic pain syndrome 01/06/2021   Diarrhea 10/28/2020   Incontinence of feces 10/28/2020   History of breast cancer 06/28/2018   Bilateral lower extremity edema 01/16/2016   Breast cancer, left breast ([place] 11/05/2015   Osteoporosis,  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   Controlled type 2 diabetes mellitus with microalbuminuria ([place] 04/25/2015   Hyperlipidemia 04/25/2015   Benign hypertension 04/25/2015   Adult hypothyroidism 04/25/2015   At risk for falling  04/25/2015   Major depression ([place]) 04/25/2015   Post menopausal syndrome 04/25/2015    Immunization History  Administered Date(s) Administered   Fluad Quad(high Dose 65+) 05/30/2019, 06/23/2020   Influenza, High Dose Seasonal PF 06/12/2014, 06/09/2015, 07/01/2016, 04/29/2017, 06/28/2018   Influenza,inj,Quad PF,6+ Mos 07/23/2013   PFIZER(Purple Top)SARS-COV-2 Vaccination 09/29/2019, 10/20/2019   Pneumococcal Conjugate-13 08/13/2014   Pneumococcal Polysaccharide-23 07/29/2017   Tdap 06/23/2020    Conditions to be addressed/monitored:  Hypertension, Hyperlipidemia, Diabetes, Hypothyroidism, Osteoporosis, and Chronic Pain  Care Plan : General Pharmacy (Adult)  Updates made by Germaine Pomfret, RPH since 03/04/2021 12:00 AM     Problem: Hypertension, Hyperlipidemia, Diabetes, Hypothyroidism, Osteoporosis, and Chronic Pain   Priority: High     Long-Range Goal: Patient-Specific Goal   Start Date: 02/04/2021  Expected End Date: 03/04/2022  This Visit's Progress: On track  Priority: High  Note:   Current Barriers:  Unable to achieve control of osteoporosis   Pharmacist Clinical Goal(s):  Patient will achieve control of osteoporosis as evidenced by prevention of bone fractures maintain control of diabetes as evidenced by A1c less than 8%  through collaboration with PharmD and provider.   Interventions: 1:1 collaboration with [name], MD regarding development and update of comprehensive plan of care as evidenced by provider attestation and co-signature Inter-disciplinary care team collaboration (see longitudinal plan of care) Comprehensive medication review performed; medication list updated in electronic medical record  Hypertension (BP goal  <140/90) -Uncontrolled -Current treatment: Amlodipine 2.5 mg nightly Lisinopril 40 mg daily  -Medications previously tried: NA  -Current home readings: NA -Denies hypotensive/hypertensive symptoms -Counseled to monitor BP at home weekly, document, and provide log at future appointments -Recommended to continue current medication  Hyperlipidemia: (LDL goal < 100) -Controlled -Current treatment: Atorvastatin 40 mg daily  -Medications previously tried: NA  -Recommended to continue current medication  Diabetes (A1c goal <8%) -Controlled -Current medications: Tresiba 10 units daily  -Medications previously tried: Metformin, Januvia,Glipizide   -Current home glucose readings fasting glucose: NA post prandial glucose: 273, 137, 153  -Denies hypoglycemic/hyperglycemic symptoms -Recommended to continue current medication  Osteoporosis / Osteopenia (Goal Prevent bone fractures) -Controlled -Patient is a candidate for pharmacologic treatment due to T-Score < -2.5 in femoral neck, T-Score < -2.5 in total hip , and T-Score < -2.5 in lumbar spine -Current treatment  Calcium + D 600-800 daily  -Medications previously tried: Alendronate (ineffective), Prolia (cost)  -Recommended to continue current medication  Depression/Anxiety (Goal: Maintain stable mood) -Uncontrolled -Current treatment: Sertraline 50 mg daily  -Medications previously tried/failed: NA -PHQ9: 25 -Recommended cognitive behavioral therapy, patient will consider -Recommended to continue current medication  Hypothyroidism (Goal: Maintain stable thyroid function) -Controlled -Current treatment  Levothyroxine 112 mcg 1/2 tablet on Sunday, 1 tablet all other days  -Medications previously tried: NA  -Recommended to continue current medication  Patient Goals/Self-Care Activities Patient will:  - check glucose daily before breakfast, document, and provide at future appointments check blood pressure weekly, document,  and provide at future appointments  Follow Up Plan: Telephone follow up appointment with care management team member scheduled for:  08/05/2021 at 1:00 PM      Medication Assistance: None required.  Patient affirms current coverage meets needs.  Compliance/Adherence/Medication fill history: Care Gaps: Shingrix  Covid Booster   Star-Rating Drugs: Atorvastatin 40 mg last filled on 11/17/2020 for 90 day supply at [facility]. Lisinopril 40 mg last filled on 01/06/2021 for 90 day supply at [facility].  Patient's preferred pharmacy is:  [facility]  [place], [place] [address] [place] [address] Phone: [phone] Fax: [phone]  Uses pill box? Yes Pt endorses 100% compliance  We discussed: Current pharmacy is preferred with insurance plan and patient is satisfied with pharmacy services Patient decided to: Continue current medication management strategy  Care Plan and Follow Up Patient Decision:  Patient agrees to Care Plan and Follow-up.  Plan: Telephone follow up appointment with care management team member scheduled for:  08/05/2021 at 1:00 PM  Doristine Section, [facility] [phone]

## 2021-02-05 DIAGNOSIS — M48062 Spinal stenosis, lumbar region with neurogenic claudication: Secondary | ICD-10-CM | POA: Diagnosis not present

## 2021-02-09 DIAGNOSIS — L03115 Cellulitis of right lower limb: Secondary | ICD-10-CM | POA: Diagnosis not present

## 2021-03-04 NOTE — Patient Instructions (Signed)
Visit Information It was great speaking with you today!  Please let me know if you have any questions about our visit.   Goals Addressed             This Visit's Progress    Monitor and Manage My Blood Sugar-Diabetes Type 2       Timeframe:  Long-Range Goal Priority:  High Start Date: 02/02/2021                            Expected End Date: 02/02/2022                      Follow Up Date 04/21/2021    - check blood sugar at prescribed times - check blood sugar if I feel it is too high or too low - enter blood sugar readings and medication or insulin into daily log    Why is this important?   Checking your blood sugar at home helps to keep it from getting very high or very low.  Writing the results in a diary or log helps the doctor know how to care for you.  Your blood sugar log should have the time, date and the results.  Also, write down the amount of insulin or other medicine that you take.  Other information, like what you ate, exercise done and how you were feeling, will also be helpful.     Notes:          Patient Care Plan: General Pharmacy (Adult)     Problem Identified: Hypertension, Hyperlipidemia, Diabetes, Hypothyroidism, Osteoporosis, and Chronic Pain   Priority: High     Long-Range Goal: Patient-Specific Goal   Start Date: 02/04/2021  Expected End Date: 03/04/2022  This Visit's Progress: On track  Priority: High  Note:   Current Barriers:  Unable to achieve control of osteoporosis   Pharmacist Clinical Goal(s):  Patient will achieve control of osteoporosis as evidenced by prevention of bone fractures maintain control of diabetes as evidenced by A1c less than 8%  through collaboration with PharmD and provider.   Interventions: 1:1 collaboration with [name], MD regarding development and update of comprehensive plan of care as evidenced by provider attestation and co-signature Inter-disciplinary care team collaboration (see longitudinal plan of  care) Comprehensive medication review performed; medication list updated in electronic medical record  Hypertension (BP goal <140/90) -Uncontrolled -Current treatment: Amlodipine 2.5 mg nightly Lisinopril 40 mg daily  -Medications previously tried: NA  -Current home readings: NA -Denies hypotensive/hypertensive symptoms -Counseled to monitor BP at home weekly, document, and provide log at future appointments -Recommended to continue current medication  Hyperlipidemia: (LDL goal < 100) -Controlled -Current treatment: Atorvastatin 40 mg daily  -Medications previously tried: NA  -Recommended to continue current medication  Diabetes (A1c goal <8%) -Controlled -Current medications: Tresiba 10 units daily  -Medications previously tried: Metformin, Januvia,Glipizide   -Current home glucose readings fasting glucose: NA post prandial glucose: 273, 137, 153  -Denies hypoglycemic/hyperglycemic symptoms -Recommended to continue current medication  Osteoporosis / Osteopenia (Goal Prevent bone fractures) -Controlled -Patient is a candidate for pharmacologic treatment due to T-Score < -2.5 in femoral neck, T-Score < -2.5 in total hip , and T-Score < -2.5 in lumbar spine -Current treatment  Calcium + D 600-800 daily  -Medications previously tried: Alendronate (ineffective), Prolia (cost)  -Recommended to continue current medication  Depression/Anxiety (Goal: Maintain stable mood) -Uncontrolled -Current treatment: Sertraline 50 mg daily  -Medications previously  tried/failed: NA -PHQ9: 25 -Recommended cognitive behavioral therapy, patient will consider -Recommended to continue current medication  Hypothyroidism (Goal: Maintain stable thyroid function) -Controlled -Current treatment  Levothyroxine 112 mcg 1/2 tablet on Sunday, 1 tablet all other days  -Medications previously tried: NA  -Recommended to continue current medication  Patient Goals/Self-Care Activities Patient will:   - check glucose daily before breakfast, document, and provide at future appointments check blood pressure weekly, document, and provide at future appointments  Follow Up Plan: Telephone follow up appointment with care management team member scheduled for:  08/05/2021 at 1:00 PM      Patient agreed to services and verbal consent obtained.   The patient verbalized understanding of instructions, educational materials, and care plan provided today and declined offer to receive copy of patient instructions, educational materials, and care plan.   Doristine Section, [facility] [phone]

## 2021-03-09 IMAGING — CT CT KNEE*R* W/O CM
3 series · 13 of 33 positions shown, 16 images · non-contrast
Comparison: None available

CLINICAL DATA: Tibial plateau fracture knee pain

EXAM:
CT OF THE right KNEE WITHOUT CONTRAST
TECHNIQUE: Multidetector CT imaging of the right knee was performed according
to the standard protocol. Multiplanar CT image reconstructions were
also generated.

[Series 4: axial st · axial · 0.42mm/px · z∈[+742,+918]mm · 5 of 128 slices shown, 7 images]
[im 20/128  soft-tissue]
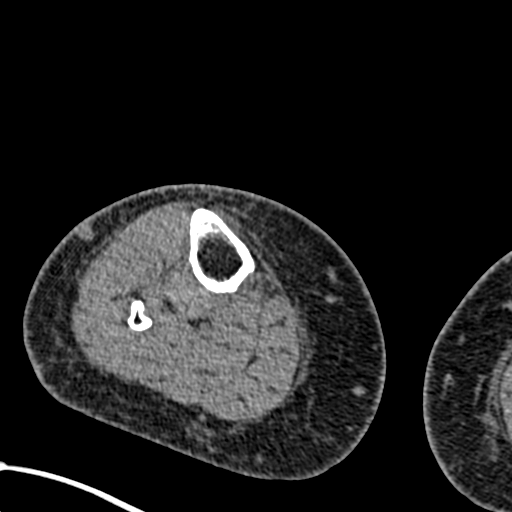
[im 20/128  bone]
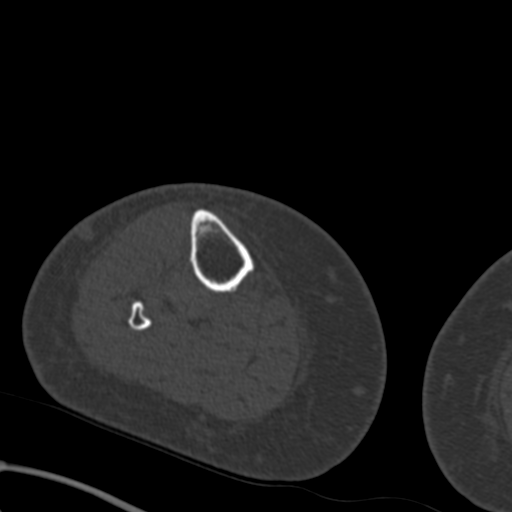
[im 40/128  bone]
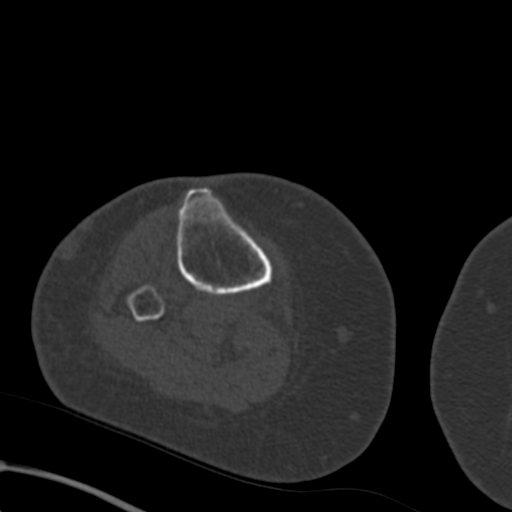
[im 69/128  bone]
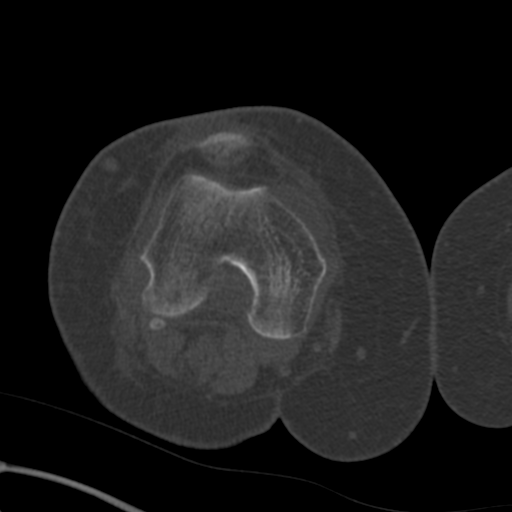
[im 88/128  bone]
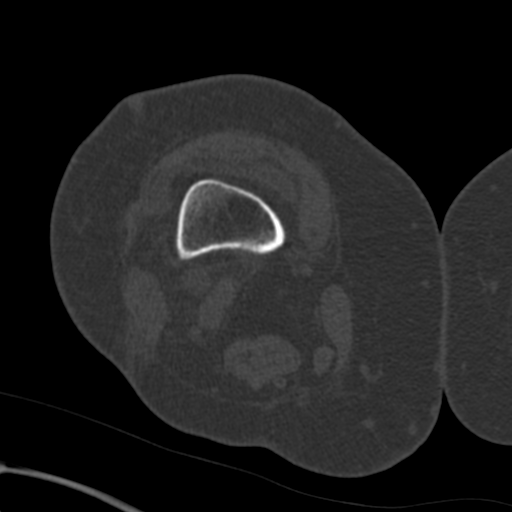
[im 108/128  soft-tissue]
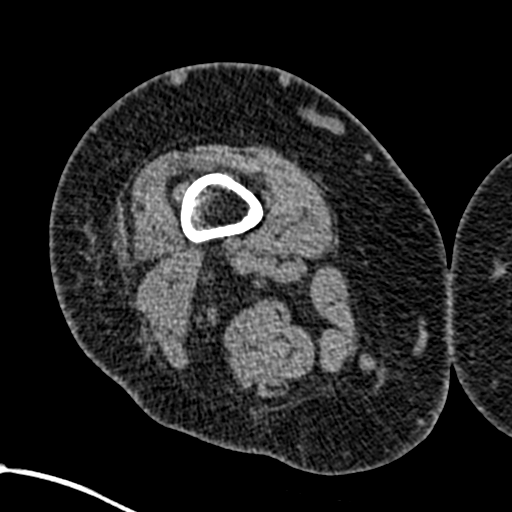
[im 108/128  bone]
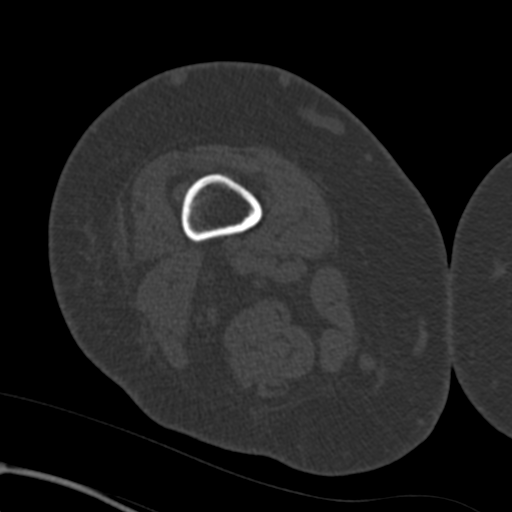

[Series 9: coronal st · coronal · 0.44mm/px · 3 of 95 slices shown]
[im 19/95  bone]
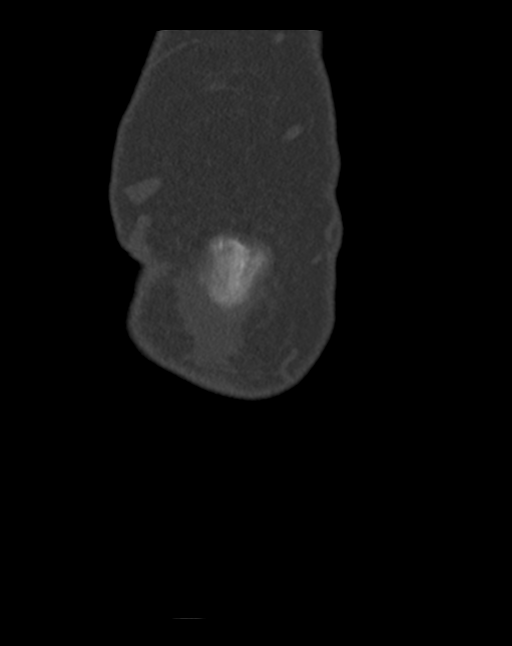
[im 38/95  bone]
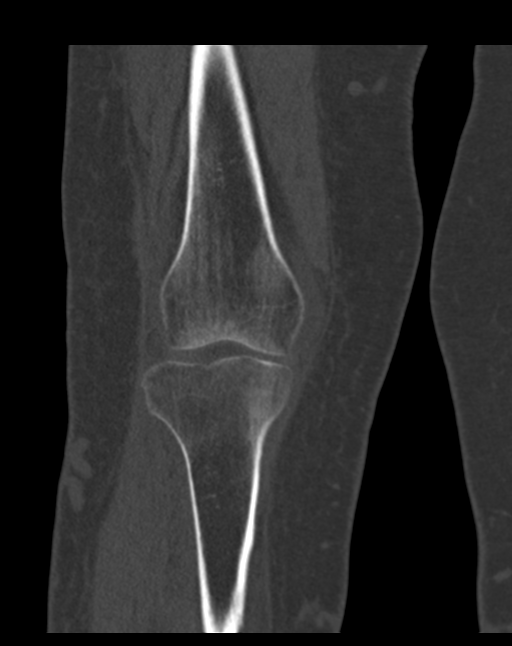
[im 57/95  bone]
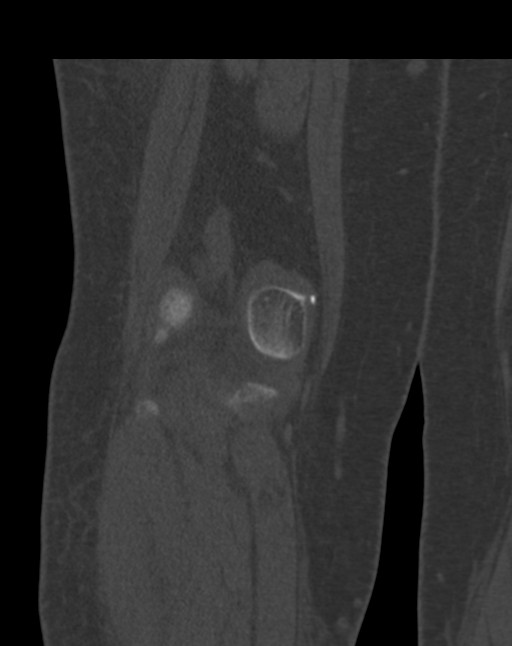

[Series 10: sagittal st · sagittal · 0.37mm/px · 5 of 114 slices shown, 6 images]
[im 38/114  bone]
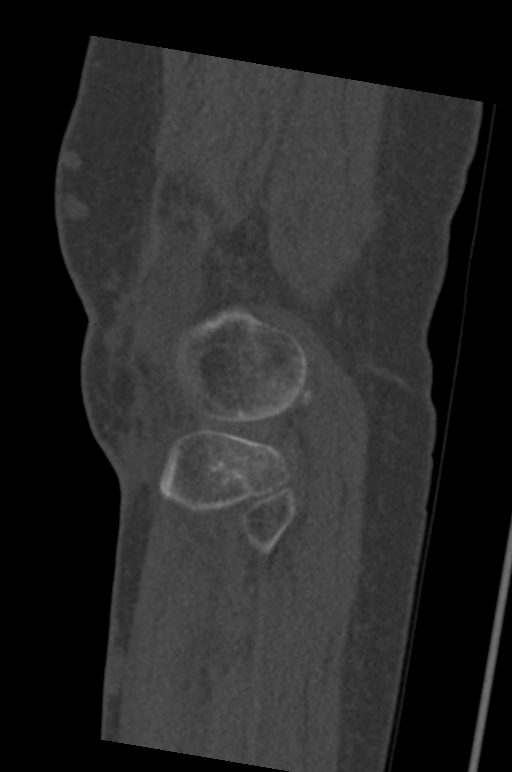
[im 48/114  bone]
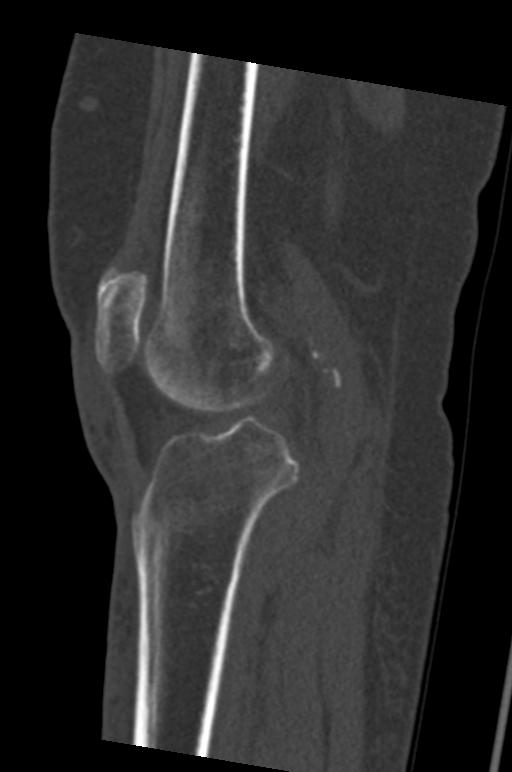
[im 57/114  soft-tissue]
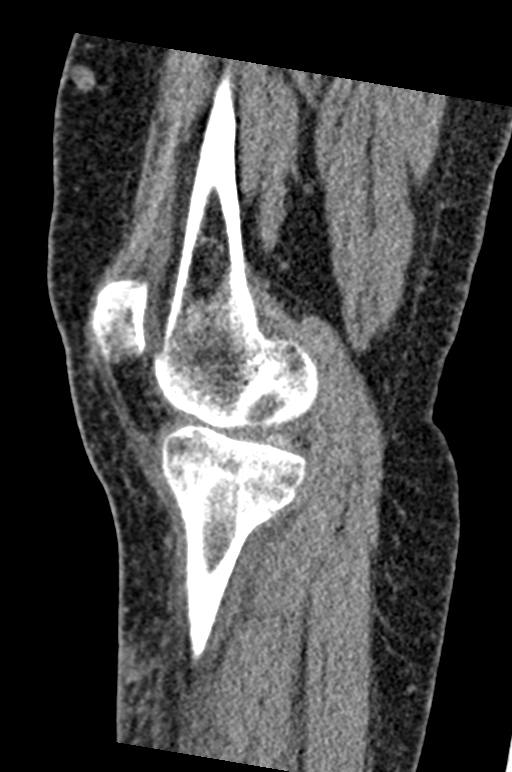
[im 57/114  bone]
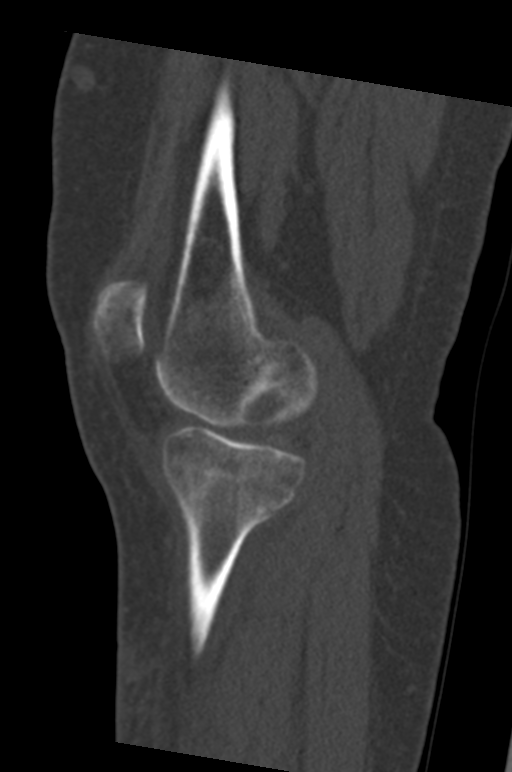
[im 66/114  bone]
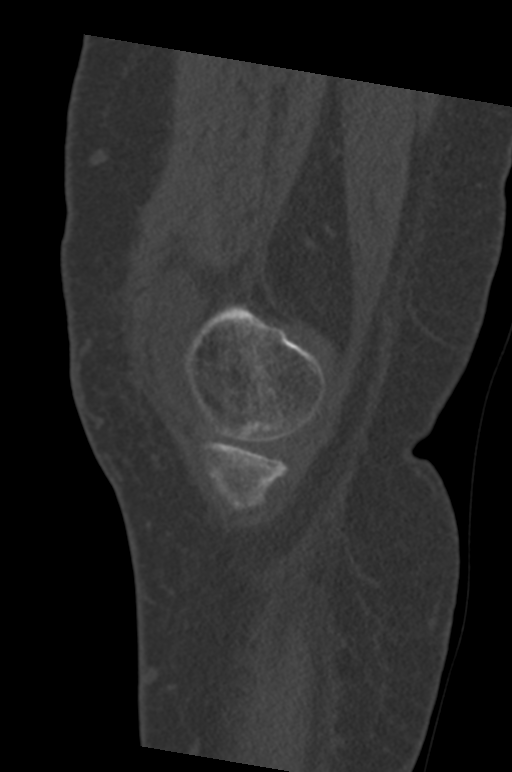
[im 76/114  bone]
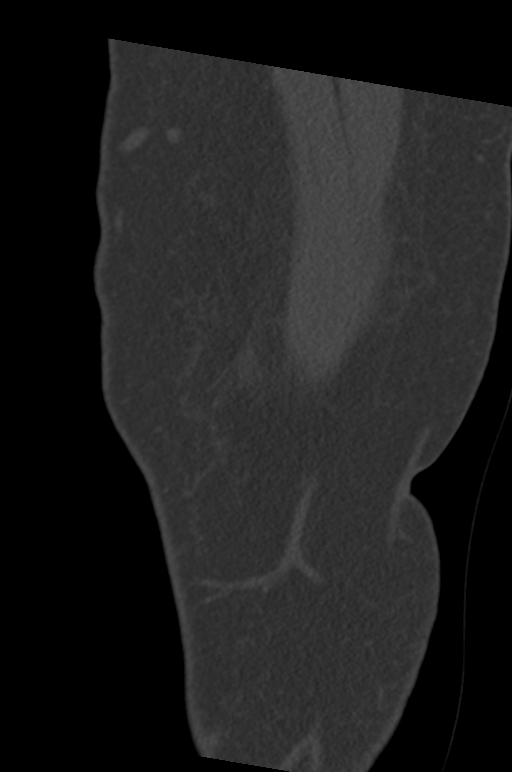

[13 of 33 positions shown; findings below may reference images not displayed]

FINDINGS: Bones/Joint/Cartilage

No definitive fracture is seen. A small knee effusion is present.
Mild medial joint space degenerative change. Minimal patellofemoral
degenerative change.

Ligaments

Suboptimally assessed by CT.

Muscles and Tendons

No intramuscular fluid collections. Quadriceps and patellar tendons
appear grossly intact.

Soft tissues

Edema within the infrapatellar soft tissues and proximal leg.
Varicosities within the superficial subcutaneous fat.
IMPRESSION: 1. No definitive fracture is seen. A small knee effusion is present.
If further imaging/evaluation is required, MRI could be obtained.
2. Edema within the infrapatellar soft tissues and proximal leg.

## 2021-03-10 NOTE — Progress Notes (Signed)
Name: Lindsey [name]   MRN: [id number]    DOB: 1941/01/12   Date:03/11/2021       Progress Note  Subjective  Chief Complaint  Follow Up  I connected with  Lindsey [name]  on 03/11/21 at 11:20 AM EDT by a telephone visit and verified that I am speaking with the correct person using two identifiers.  I discussed the limitations of evaluation and management by telemedicine and the availability of in person appointments. The patient expressed understanding and agreed to proceed with the virtual visit  Staff also discussed with the patient that there Zarzycki be a patient responsible charge related to this service. Patient Location:at home  Provider Location: [facility] Additional Individuals present: husband   HPI   Major Depression: We had switched her from zoloft to duloxetine but she stopped due to nausea and vomiting, during her last visit, she was in with [name] ( daughter) who was very worried about her. She also cried during the visit . Spoke to [place] over the phone today and she feels like her mother has been doing better, no longer having crying spells. Appetite is a little better. She continues to have a lot of pain, and gets frustrated . She states she has difficulty falling asleep , she states sometimes because of pain, but sometimes she is worried    Patient Active Problem List   Diagnosis Date Noted   Primary osteoarthritis of both knees 01/06/2021   Low serum vitamin B12 01/06/2021   Chronic bilateral low back pain with right-sided sciatica 01/06/2021   Dyslipidemia associated with type 2 diabetes mellitus ([place]) 01/06/2021   Mild protein-calorie malnutrition ([place]) 01/06/2021   Chronic pain syndrome 01/06/2021   Diarrhea 10/28/2020   Incontinence of feces 10/28/2020   History of breast cancer 06/28/2018   Bilateral lower extremity edema 01/16/2016   Breast cancer, left breast ([place]) 11/05/2015   Osteoporosis, 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   Controlled type 2 diabetes mellitus with  microalbuminuria ([place]) 04/25/2015   Hyperlipidemia 04/25/2015   Benign hypertension 04/25/2015   Adult hypothyroidism 04/25/2015   At risk for falling 04/25/2015   Major depression ([place]) 04/25/2015   Post menopausal syndrome 04/25/2015    Past Surgical History:  Procedure Laterality Date   BREAST BIOPSY Left 11-04-15   BREAST BIOPSY Left 10-10-07   CATARACT EXTRACTION Bilateral 1990   CHOLECYSTECTOMY     IR KYPHO EA ADDL LEVEL THORACIC OR LUMBAR  12/25/2020   IR KYPHO LUMBAR INC FX REDUCE BONE BX UNI/BIL CANNULATION INC/IMAGING  12/25/2020   IR RADIOLOGIST EVAL & MGMT  12/17/2020   IR VERTEBROPLASTY LUMBAR BX INC UNI/BIL INC/INJECT/IMAGING  12/25/2020   MASTECTOMY Right 1983   Dr Myrle Sheng   MASTECTOMY Right 1983   MASTECTOMY W/ SENTINEL NODE BIOPSY Left 12/12/2015   Procedure: MASTECTOMY WITH SENTINEL LYMPH NODE BIOPSY;  Surgeon: Christene Lye, MD;  Location: [facility];  Service: General;  Laterality: Left;   ORIF ULNAR FRACTURE Left 12/11/2018   Procedure: OPEN REDUCTION INTERNAL FIXATION (ORIF) LEFT PROXIMAL ULNA FRACTURE AND  RADIAL HEAD REPLACEMENT;  Surgeon: Milly Jakob, MD;  Location: [place];  Service: Orthopedics;  Laterality: Left;   RADIAL HEAD ARTHROPLASTY Left 12/11/2018   Procedure: OPEN REDUCTION INTERNAL FIXATION (ORIF) LEFT PROXIMAL ULNA FRACTURE AND  RADIAL HEAD REPLACEMENT;  Surgeon: Milly Jakob, MD;  Location: [place];  Service: Orthopedics;  Laterality: Left;    Family History  Problem Relation Age of Onset   Kidney disease  Mother    Heart disease Father    Stroke Sister    Cancer Sister        breast   Breast cancer Sister    Stroke Brother    Diabetes Brother    Hypertension Brother    Cancer Daughter 51       DCIS/lumpectomy/genetic negative   Breast cancer Daughter       Current Outpatient Medications:    acetaminophen (TYLENOL) 325 MG tablet, Take 2 tablets (650 mg total) by mouth every 6 (six) hours.,  Disp: , Rfl:    amLODipine (NORVASC) 2.5 MG tablet, Take 1 tablet (2.5 mg total) by mouth at bedtime., Disp: 90 tablet, Rfl: 0   atorvastatin (LIPITOR) 40 MG tablet, Take 1 tablet (40 mg total) by mouth daily., Disp: 90 tablet, Rfl: 1   B-D UF III MINI PEN NEEDLES 31G X 5 MM MISC, USE AS DIRECTED BY DOCTOR, USE TO INJECT INSULIN, Disp: 100 each, Rfl: 3   blood glucose meter kit and supplies, Dispense based on patient and insurance preference. Use up to four times daily as directed. (FOR ICD-10 E10.9, E11.9)., Disp: 1 each, Rfl: 0   calcium-vitamin D (OSCAL WITH D) 500-200 MG-UNIT tablet, Take 1 tablet by mouth daily. 675m calcium 800 units Vitamin D, Disp: , Rfl:    cholecalciferol (VITAMIN D) 1000 UNITS tablet, Take by mouth., Disp: , Rfl:    ketoconazole (NIZORAL) 2 % cream, Apply topically 2 (two) times daily., Disp: 60 g, Rfl: 1   Lancets (ONETOUCH DELICA PLUS [id number] MISC, USE TO TEST UP TO FOUR TIMES A DAY AS DIRECTED, Disp: , Rfl:    letrozole (FEMARA) 2.5 MG tablet, Take 2.5 mg by mouth daily., Disp: , Rfl:    levothyroxine (SYNTHROID) 112 MCG tablet, Take 1 tablet (112 mcg total) by mouth daily. And half on Sundays, Disp: 90 tablet, Rfl: 1   lisinopril (ZESTRIL) 40 MG tablet, Take 1 tablet (40 mg total) by mouth at bedtime., Disp: 90 tablet, Rfl: 1   ONETOUCH ULTRA test strip, USE TO TEST UP TO FOUR TIMES A DAY, Disp: 300 strip, Rfl: 2   pregabalin (LYRICA) 75 MG capsule, Take 1 capsule (75 mg total) by mouth 3 (three) times daily., Disp: 270 capsule, Rfl: 0   sertraline (ZOLOFT) 50 MG tablet, Take 1 tablet (50 mg total) by mouth daily., Disp: 90 tablet, Rfl: 0   tiZANidine (ZANAFLEX) 2 MG tablet, Take 1 tablet (2 mg total) by mouth every 8 (eight) hours as needed for muscle spasms., Disp: 90 tablet, Rfl: 0   TRESIBA FLEXTOUCH 100 UNIT/ML FlexTouch Pen, INJECT 10 UNITS INTO THE SKIN ONCE DAILY, Disp: 15 mL, Rfl: 0  No Known Allergies  I personally reviewed active problem list,  medication list, allergies with the patient/caregiver today.   ROS  Ten systems reviewed and is negative except as mentioned in HPI   Objective  Virtual encounter, vitals not obtained.  There is no height or weight on file to calculate BMI.  Physical Exam  Awake, alert and oriented  PHQ2/9: [facility] 03/11/2021 01/06/2021 12/23/2020 10/29/2020 08/12/2020  Decreased Interest _0 Down, Depressed, Hopeless _1 PHQ - 2 Score _2 Altered sleeping 3 3 0 3 2  Tired, decreased energy _3 Change in appetite 0 _4 0  Feeling bad or failure about yourself  3 3 0 0  2  Trouble concentrating 1 3 0 0 0  Moving slowly or fidgety/restless 0 1 0 0 2  Suicidal thoughts 0 3 0 0 0  PHQ-9 Score _0 Difficult doing work/chores - - Somewhat difficult Somewhat difficult Very difficult  Some recent data might be hidden   PHQ-2/9 Result is positive.    Fall Risk: Fall Risk  03/11/2021 01/06/2021 12/23/2020 10/29/2020 08/12/2020  Falls in the past year? 0 0 0 0 0  Number falls in past yr: 0 0 0 0 0  Injury with Fall? 0 0 0 0 0  Risk for fall due to : - - Impaired balance/gait;Impaired mobility;Orthopedic patient - -  Risk for fall due to: Comment - - - - -  Follow up - - Falls prevention discussed Falls prevention discussed -    Assessment & Plan  1. Moderate episode of recurrent major depressive disorder (HCC)  - sertraline (ZOLOFT) 50 MG tablet; Take 1 tablet (50 mg total) by mouth daily.  Dispense: 90 tablet; Refill: 0    2. Insomnia due to mental condition  Discussed risk of falls, don't take with other medications that can cause sedation and monitor  - temazepam (RESTORIL) 15 MG capsule; Take 1 capsule (15 mg total) by mouth at bedtime as needed for sleep.  Dispense: 30 capsule; Refill: 0   I discussed the assessment and treatment plan with the patient. The patient was provided an opportunity to ask questions and all were answered. The  patient agreed with the plan and demonstrated an understanding of the instructions.  The patient was advised to call back or seek an in-person evaluation if the symptoms worsen or if the condition fails to improve as anticipated.  I provided 15  minutes of non-face-to-face time during this encounter.

## 2021-03-11 ENCOUNTER — Encounter: Payer: Self-pay | Admitting: Family Medicine

## 2021-03-11 ENCOUNTER — Ambulatory Visit (INDEPENDENT_AMBULATORY_CARE_PROVIDER_SITE_OTHER): Payer: PPO | Admitting: Family Medicine

## 2021-03-11 DIAGNOSIS — F331 Major depressive disorder, recurrent, moderate: Secondary | ICD-10-CM

## 2021-03-11 DIAGNOSIS — F5105 Insomnia due to other mental disorder: Secondary | ICD-10-CM

## 2021-03-11 MED ORDER — TEMAZEPAM 15 MG PO CAPS: 15.0000 mg | ORAL_CAPSULE | Freq: Every evening | ORAL | 0 refills | Status: DC | PRN

## 2021-03-11 MED ORDER — SERTRALINE HCL 50 MG PO TABS: 50.0000 mg | ORAL_TABLET | Freq: Every day | ORAL | 0 refills | Status: DC

## 2021-03-25 DIAGNOSIS — M47816 Spondylosis without myelopathy or radiculopathy, lumbar region: Secondary | ICD-10-CM | POA: Diagnosis not present

## 2021-03-31 ENCOUNTER — Telehealth: Payer: Self-pay

## 2021-03-31 NOTE — Progress Notes (Addendum)
Chronic Care Management Pharmacy Assistant   Name: Gloriann Riede Mauri  MRN: [id number] DOB: 1941/01/09  Reason for Encounter: Diabetes Disease State Call   Recent office visits:  03/11/2021 [name], MD (PCP Office Visit) for Follow-up- Started on Temazepam 15 mg; no follow-up noted  Recent consult visits:  No recent consults  Hospital visits:  None in previous 6 months  Medications: Outpatient Encounter Medications as of 03/31/2021  Medication Sig   acetaminophen (TYLENOL) 325 MG tablet Take 2 tablets (650 mg total) by mouth every 6 (six) hours.   amLODipine (NORVASC) 2.5 MG tablet Take 1 tablet (2.5 mg total) by mouth at bedtime.   atorvastatin (LIPITOR) 40 MG tablet Take 1 tablet (40 mg total) by mouth daily.   B-D UF III MINI PEN NEEDLES 31G X 5 MM MISC USE AS DIRECTED BY DOCTOR, USE TO INJECT INSULIN   blood glucose meter kit and supplies Dispense based on patient and insurance preference. Use up to four times daily as directed. (FOR ICD-10 E10.9, E11.9).   calcium-vitamin D (OSCAL WITH D) 500-200 MG-UNIT tablet Take 1 tablet by mouth daily. 637m calcium 800 units Vitamin D   cholecalciferol (VITAMIN D) 1000 UNITS tablet Take by mouth.   ketoconazole (NIZORAL) 2 % cream Apply topically 2 (two) times daily.   Lancets (ONETOUCH DELICA PLUS [id number] MISC USE TO TEST UP TO FOUR TIMES A DAY AS DIRECTED   letrozole (FEMARA) 2.5 MG tablet Take 2.5 mg by mouth daily.   levothyroxine (SYNTHROID) 112 MCG tablet Take 1 tablet (112 mcg total) by mouth daily. And half on Sundays   lisinopril (ZESTRIL) 40 MG tablet Take 1 tablet (40 mg total) by mouth at bedtime.   ONETOUCH ULTRA test strip USE TO TEST UP TO FOUR TIMES A DAY   pregabalin (LYRICA) 75 MG capsule Take 1 capsule (75 mg total) by mouth 3 (three) times daily.   sertraline (ZOLOFT) 50 MG tablet Take 1 tablet (50 mg total) by mouth daily.   temazepam (RESTORIL) 15 MG capsule Take 1 capsule (15 mg total) by mouth at bedtime as  needed for sleep.   tiZANidine (ZANAFLEX) 2 MG tablet Take 1 tablet (2 mg total) by mouth every 8 (eight) hours as needed for muscle spasms.   TRESIBA FLEXTOUCH 100 UNIT/ML FlexTouch Pen INJECT 10 UNITS INTO THE SKIN ONCE DAILY   No facility-administered encounter medications on file as of 03/31/2021.   Care Gaps: Zoster Vaccines- Shingrix COVID-19 Vaccine Booster 3 INFLUENZA VACCINE (Last completed 06/23/2020)  Star Rating Drugs: Lisinopril 40 mg last filled on 07/274/2022 for a 90-Day supply with [facility] Atorvastatin 40 mg last filled on 03/18/2021 for a 90-Day supply with [facility]  Recent Relevant Labs: Lab Results  Component Value Date/Time   HGBA1C 7.5 (A) 01/06/2021 10:52 AM   HGBA1C 6.7 (A) 06/23/2020 11:05 AM   HGBA1C 11.8 (H) 12/19/2019 11:34 AM   HGBA1C 7.8 (H) 11/27/2018 11:21 AM   MICROALBUR 8.0 01/06/2021 11:31 AM   MICROALBUR 0.7 12/26/2019 11:33 AM   MICROALBUR NEG 07/01/2016 10:10 AM    Kidney Function Lab Results  Component Value Date/Time   CREATININE 0.84 01/06/2021 11:31 AM   CREATININE 0.77 09/12/2020 06:02 PM   CREATININE 0.92 (H) 03/31/2020 12:52 PM   GFRNONAA 65 01/06/2021 11:31 AM   GFRAA 76 01/06/2021 11:31 AM    Current antihyperglycemic regimen:  Tresiba 100 unit inject 10 units once daily  What recent interventions/DTPs have been made to improve glycemic control:  Patient  had an appointment with [name] recently and he requested that she start taking her blood sugar after breakfast  Have there been any recent hospitalizations or ED visits since last visit with CPP? No  Patient denies hypoglycemic symptoms, including Pale, Sweaty, Shaky, Hungry, Nervous/irritable, and Vision changes  Patient denies hyperglycemic symptoms, including blurry vision, excessive thirst, fatigue, polyuria, and weakness  How often are you checking your blood sugar? once daily  What are your blood sugars ranging?  Per daughter Patient does take blood sugars  after she eats her breakfast but the daughter could not provide me with any numbers at this time. She stated that her sister-in-law is the one who normally takes the patients blood sugars and keep up with her numbers. She stated that she does know the numbers have been a little elevated these past few days, but that normally happens after she get's her injections in her back, but they are keeping an eye out for it.  During the week, how often does your blood glucose drop below 70? Never  Are you checking your feet daily/regularly? Yes, and there are no issues at this time.  Adherence Review: Is the patient currently on a STATIN medication? Yes Is the patient currently on ACE/ARB medication? Yes Does the patient have >5 day gap between last estimated fill dates? No  Patients daughter reported that over all the patient is doing well. She stated that her mobility is limited due to her back pain, but the patient has been getting injections and those have been helping. She stated that the patient was able to sit and make her son 2 pound cakes for his 49th Rudene Anda this weekend. Patient's daughter also reports that she was able to attend the birthday party using her Rolator and she was moving around better than normal. She reports that the patient does eat well, but she just can't stand for long periods of time to cook so the patient's family assist her and the spouse with meals. She reports that the patient does watch her sugar, carb and starch intake when she does eat. Patient's daughter did advise that the patient does need refils on her Tizanidine and Lyrica as she is completely out. I sent a message over to [name], CPP requesting those refills be sent to the [facility] on file.  [name], CPP responded to my message at 1151 am advising me to contact the providers office to request refill for patients medications.   Spoke with Dominic and he stated he would send the request over to the  provider for patients refill.  Dominic stated that it could take up to 3 days to have refill request completed but he did put that the patient was completely out so he hopes it gets expedited based off of that.    Patient has an upcoming appointment with [name], CPP on 08/05/2021 @ 1300 AWV Completed 01/02/2021  [name], CPA/CMA Clinical Pharmacist Assistant Phone: [phone]

## 2021-04-06 ENCOUNTER — Other Ambulatory Visit: Payer: Self-pay | Admitting: Family Medicine

## 2021-04-06 DIAGNOSIS — G8929 Other chronic pain: Secondary | ICD-10-CM

## 2021-04-06 MED ORDER — TIZANIDINE HCL 2 MG PO TABS: 2.0000 mg | ORAL_TABLET | Freq: Three times a day (TID) | ORAL | 0 refills | Status: DC | PRN

## 2021-04-06 MED ORDER — PREGABALIN 75 MG PO CAPS: 75.0000 mg | ORAL_CAPSULE | Freq: Three times a day (TID) | ORAL | 0 refills | Status: DC

## 2021-04-06 NOTE — Telephone Encounter (Signed)
Next appt is 05/11/21

## 2021-04-06 NOTE — Telephone Encounter (Signed)
Tasha called from [employer] Human resources officer to [employer])   Medication Refill - Medication: tiZANidine (ZANAFLEX) 2 MG tablet   pregabalin (LYRICA) 75 MG capsule  Completely out of both    Has the patient contacted their pharmacy? No. (Agent: If no, request that the patient contact the pharmacy for the refill.) (Agent: If yes, when and what did the pharmacy advise?)  Preferred Pharmacy (with phone number or street name):  [id number] - [name], [place] [address]  [address] [place] [address]  Phone: [phone] Fax: [phone]    Agent: Please be advised that RX refills [place] take up to 3 business days. We ask that you follow-up with your pharmacy.

## 2021-04-06 NOTE — Telephone Encounter (Signed)
Requested medication (s) are due for refill today: yes  Requested medication (s) are on the active medication list: yes   Last refill:  01/06/2021  Future visit scheduled: yes  Notes to clinic:  refill cannot be delegated    Requested Prescriptions  Pending Prescriptions Disp Refills   tiZANidine (ZANAFLEX) 2 MG tablet 90 tablet 0    Sig: Take 1 tablet (2 mg total) by mouth every 8 (eight) hours as needed for muscle spasms.     Not Delegated - Cardiovascular:  Alpha-2 Agonists - tizanidine Failed - 04/06/2021 12:39 PM      Failed - This refill cannot be delegated      Passed - Valid encounter within last 6 months    Recent Outpatient Visits           3 weeks ago Moderate episode of recurrent major depressive disorder [facility])   [facility], Drue Stager, MD   3 months ago Dyslipidemia associated with type 2 diabetes mellitus [facility])   [facility] [name], MD   5 months ago Adult hypothyroidism   [facility] [place], Drue Stager, MD   7 months ago Controlled type 2 diabetes mellitus with microalbuminuria, unspecified whether long term insulin use [facility])   [facility] [name], MD   9 months ago Insect bite of right thigh, initial encounter   [facility] Lebron Conners D, MD       Future Appointments             In 1 month [name], MD [place]   In 8 months  [facility]             pregabalin (LYRICA) 75 MG capsule 270 capsule 0    Sig: Take 1 capsule (75 mg total) by mouth 3 (three) times daily.     Not Delegated - Neurology:  Anticonvulsants - Controlled Failed - 04/06/2021 12:39 PM      Failed - This refill cannot be delegated      Passed - Valid encounter within last 12 months    Recent Outpatient Visits           3 weeks ago Moderate episode of recurrent major depressive disorder [facility])    [facility] [place], Drue Stager, MD   3 months ago Dyslipidemia associated with type 2 diabetes mellitus [facility])   [facility] [name], MD   5 months ago Adult hypothyroidism   [facility] [place], Drue Stager, MD   7 months ago Controlled type 2 diabetes mellitus with microalbuminuria, unspecified whether long term insulin use [facility])   [facility] [name], MD   9 months ago Insect bite of right thigh, initial encounter   [facility] [place] Malkin, MD       Future Appointments             In 1 month Ancil Boozer, Drue Stager, MD [facility] [place]   In 8 months  [facility], [facility]

## 2021-05-08 NOTE — Progress Notes (Signed)
Name: Lindsey [name]   MRN: [id number]    DOB: May 05, 1941   Date:05/11/2021       Progress Note  Subjective  Chief Complaint  Follow Up  I connected with  Lindsey [name]  on 05/11/21 at 10:40 AM EDT by a video enabled telemedicine application and verified that I am speaking with the correct person using two identifiers.  I discussed the limitations of evaluation and management by telemedicine and the availability of in person appointments. The patient expressed understanding and agreed to proceed with the virtual visit  Staff also discussed with the patient that there Momon be a patient responsible charge related to this service. 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  Also low HDL, on statin therapy and denies myalgia. . Denies polyphagia, polyuria or polydipsia.. HgbA1C went from 7.1 % to 7.8% to 7.3% and up to 11 % but went down to  6.7% , 7.5 %.and today it is down to 7%  We will continue current regiment    Hypothyroidism: she has been on Synthroid 112 mcg once daily and only half pill on Sunday , last TSH was normal 08/12/20 but previous one ws suppressed at 0.34  . She has not been taking medications lately due to depression   Malnutrition: she had lost 17 lbs in the previous 5 months but gained 10 lbs since last visit,  she states her appetite has improved. No longer  crying all the time,   Major Depression: She is feeling better now , husband is weak, she is now having a more pain, even after procedures, feels discouraged, but no longer feeling depressed like she was. She is taking Zoloft daily and no side effects.  Diarrhea: seeing [facility], she was given probiotics, episodes of diarrhea are  down to once or twice daily and not well formed. No blood in stools   Osteoporosis with compression fractures: she is  going to Ortho , Dr. Melrose Nakayama, and was sent for  vertebro plasty of L2-3-4 on [address] . She states her pain is intense, did not improve after procedure. Also had steroid injection on right outer hip a few days ago. Difficulty standing up , walking, also affecting her ability to take a shower on her own. She is upset because the pain is taking her ability to be independent. She also has OA knees. I gave her Lyrica but she stopped all medications due to depression. She  takes Tizanidine but states not sure if it helps. Advised to add Tylenol   Breast cancer : she lost to follow up with oncologist, she stopped Femara on her own , she did not go to her last follow up, advised her to contact them back   Patient Active Problem List   Diagnosis Date Noted   Primary osteoarthritis of both knees 01/06/2021   Low serum vitamin B12 01/06/2021   Chronic bilateral low back pain with right-sided sciatica 01/06/2021   Dyslipidemia associated with type 2 diabetes mellitus ([place]) 01/06/2021   Mild protein-calorie malnutrition ([facility]) 01/06/2021   Chronic pain syndrome 01/06/2021   Diarrhea 10/28/2020   Incontinence of feces 10/28/2020   History of breast cancer 06/28/2018   Bilateral lower extremity edema 01/16/2016   Breast cancer, left breast ([place]) 11/05/2015   Osteoporosis, 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  Controlled type 2 diabetes mellitus with microalbuminuria ([place]) 04/25/2015   Hyperlipidemia 04/25/2015   Benign hypertension 04/25/2015   Adult hypothyroidism 04/25/2015   At risk for falling 04/25/2015   Major depression ([place]) 04/25/2015   Post menopausal syndrome 04/25/2015    Past Surgical History:  Procedure Laterality Date   BREAST BIOPSY Left 11-04-15   BREAST BIOPSY Left 10-10-07   CATARACT EXTRACTION Bilateral 1990   CHOLECYSTECTOMY     IR KYPHO EA ADDL LEVEL THORACIC OR LUMBAR  12/25/2020   IR KYPHO LUMBAR INC FX REDUCE BONE BX UNI/BIL CANNULATION INC/IMAGING  12/25/2020   IR RADIOLOGIST  EVAL & MGMT  12/17/2020   IR VERTEBROPLASTY LUMBAR BX INC UNI/BIL INC/INJECT/IMAGING  12/25/2020   MASTECTOMY Right 1983   Dr Myrle Sheng   MASTECTOMY Right 1983   MASTECTOMY W/ SENTINEL NODE BIOPSY Left 12/12/2015   Procedure: MASTECTOMY WITH SENTINEL LYMPH NODE BIOPSY;  Surgeon: Christene Lye, MD;  Location: [facility];  Service: General;  Laterality: Left;   ORIF ULNAR FRACTURE Left 12/11/2018   Procedure: OPEN REDUCTION INTERNAL FIXATION (ORIF) LEFT PROXIMAL ULNA FRACTURE AND  RADIAL HEAD REPLACEMENT;  Surgeon: Milly Jakob, MD;  Location: [place];  Service: Orthopedics;  Laterality: Left;   RADIAL HEAD ARTHROPLASTY Left 12/11/2018   Procedure: OPEN REDUCTION INTERNAL FIXATION (ORIF) LEFT PROXIMAL ULNA FRACTURE AND  RADIAL HEAD REPLACEMENT;  Surgeon: Milly Jakob, MD;  Location: [place];  Service: Orthopedics;  Laterality: Left;    Family History  Problem Relation Age of Onset   Kidney disease Mother    Heart disease Father    Stroke Sister    Cancer Sister        breast   Breast cancer Sister    Stroke Brother    Diabetes Brother    Hypertension Brother    Cancer Daughter 46       DCIS/lumpectomy/genetic negative   Breast cancer Daughter     Social History   Socioeconomic History   Marital status: Married    Spouse name: Not on file   Number of children: 3   Years of education: Not on file   Highest education level: Not on file  Occupational History   Occupation: Retired  Tobacco Use   Smoking status: Never   Smokeless tobacco: Never   Tobacco comments:    Non-smoker. Smoking cessation materials contraindicated  Vaping Use   Vaping Use: Never used  Substance and Sexual Activity   Alcohol use: No    Alcohol/week: 0.0 standard drinks   Drug use: No   Sexual activity: Never  Other Topics Concern   Not on file  Social History Narrative   Not on file   Social Determinants of Health   Financial Resource Strain: Low Risk     Difficulty of Paying Living Expenses: Not hard at all  Food Insecurity: No Food Insecurity   Worried About Charity fundraiser in the Last Year: Never true   [place] in the Last Year: Never true  Transportation Needs: No Transportation Needs   Lack of Transportation (Medical): No   Lack of Transportation (Non-Medical): No  Physical Activity: Inactive   Days of Exercise per Week: 0 days   Minutes of Exercise per Session: 0 min  Stress: Stress Concern Present   Feeling of Stress : Rather much  Social Connections: Moderately Isolated   Frequency of Communication with Friends and Family: Three times a week   Frequency of Social  Gatherings with Friends and Family: Never   Attends Religious Services: Never   Marine scientist or Organizations: No   Attends Music therapist: Never   Marital Status: Married  Human resources officer Violence: Not At Risk   Fear of Current or Ex-Partner: No   Emotionally Abused: No   Physically Abused: No   Sexually Abused: No     Current Outpatient Medications:    acetaminophen (TYLENOL) 325 MG tablet, Take 2 tablets (650 mg total) by mouth every 6 (six) hours., Disp: , Rfl:    amLODipine (NORVASC) 2.5 MG tablet, Take 1 tablet (2.5 mg total) by mouth at bedtime., Disp: 90 tablet, Rfl: 0   atorvastatin (LIPITOR) 40 MG tablet, Take 1 tablet (40 mg total) by mouth daily., Disp: 90 tablet, Rfl: 1   B-D UF III MINI PEN NEEDLES 31G X 5 MM MISC, USE AS DIRECTED BY DOCTOR, USE TO INJECT INSULIN, Disp: 100 each, Rfl: 3   blood glucose meter kit and supplies, Dispense based on patient and insurance preference. Use up to four times daily as directed. (FOR ICD-10 E10.9, E11.9)., Disp: 1 each, Rfl: 0   calcium-vitamin D (OSCAL WITH D) 500-200 MG-UNIT tablet, Take 1 tablet by mouth daily. $RemoveBefo'600mg'[id number]$  calcium 800 units Vitamin D, Disp: , Rfl:    cholecalciferol (VITAMIN D) 1000 UNITS tablet, Take by mouth., Disp: , Rfl:    ketoconazole (NIZORAL) 2 %  cream, Apply topically 2 (two) times daily., Disp: 60 g, Rfl: 1   Lancets (ONETOUCH DELICA PLUS [id number]) MISC, USE TO TEST UP TO FOUR TIMES A DAY AS DIRECTED, Disp: , Rfl:    letrozole (FEMARA) 2.5 MG tablet, Take 2.5 mg by mouth daily., Disp: , Rfl:    levothyroxine (SYNTHROID) 112 MCG tablet, Take 1 tablet (112 mcg total) by mouth daily. And half on Sundays, Disp: 90 tablet, Rfl: 1   lisinopril (ZESTRIL) 40 MG tablet, Take 1 tablet (40 mg total) by mouth at bedtime., Disp: 90 tablet, Rfl: 1   ONETOUCH ULTRA test strip, USE TO TEST UP TO FOUR TIMES A DAY, Disp: 300 strip, Rfl: 2   pregabalin (LYRICA) 75 MG capsule, Take 1 capsule (75 mg total) by mouth 3 (three) times daily., Disp: 90 capsule, Rfl: 0   sertraline (ZOLOFT) 50 MG tablet, Take 1 tablet (50 mg total) by mouth daily., Disp: 90 tablet, Rfl: 0   temazepam (RESTORIL) 15 MG capsule, Take 1 capsule (15 mg total) by mouth at bedtime as needed for sleep., Disp: 30 capsule, Rfl: 0   tiZANidine (ZANAFLEX) 2 MG tablet, Take 1 tablet (2 mg total) by mouth every 8 (eight) hours as needed for muscle spasms., Disp: 90 tablet, Rfl: 0   TRESIBA FLEXTOUCH 100 UNIT/ML FlexTouch Pen, INJECT 10 UNITS INTO THE SKIN ONCE DAILY, Disp: 15 mL, Rfl: 0  No Known Allergies  I personally reviewed active problem list, medication list, allergies, family history, social history with the patient/caregiver today.   ROS  Constitutional: Negative for fever, positive for  weight change.  Respiratory: Negative for cough and shortness of breath.   Cardiovascular: Negative for chest pain or palpitations.  Gastrointestinal: Negative for abdominal pain, no bowel changes.  Musculoskeletal: positive  for gait problem but no joint swelling.  Skin: Negative for rash.  Neurological: Negative for dizziness or headache.  No other specific complaints in a complete review of systems (except as listed in HPI above).   Objective  Today's Vitals   05/11/21 1032  BP: 132/82  Pulse: 74  Resp: 16  Temp: 98.1 F (36.7 C)  TempSrc: Oral  SpO2: 97%  Weight: 175 lb 11.2 oz (79.7 kg)  Height: _0  (1.549 m)  PainSc: 0-No pain   Body mass index is 33.2 kg/m.    Physical Exam  Awake, alert and oriented   Results for orders placed or performed in visit on 05/11/21 (from the past 72 hour(s))  POCT HgB A1C     Status: Abnormal   Collection Time: 05/11/21 10:37 AM  Result Value Ref Range   Hemoglobin A1C 7.0 (A) 4.0 - 5.6 %   HbA1c POC (<> result, manual entry)     HbA1c, POC (prediabetic range)     HbA1c, POC (controlled diabetic range)      PHQ2/9: Depression screen [facility] 2/9 05/11/2021 03/11/2021 01/06/2021 12/23/2020 10/29/2020  Decreased Interest 0 _1 Down, Depressed, Hopeless 0 _2 PHQ - 2 Score 0 _3 Altered sleeping - 3 3 0 3  Tired, decreased energy - _4 Change in appetite - 0 _5 Feeling bad or failure about yourself  - 3 3 0 0  Trouble concentrating - 1 3 0 0  Moving slowly or fidgety/restless - 0 1 0 0  Suicidal thoughts - 0 3 0 0  PHQ-9 Score - _6 Difficult doing work/chores - - - Somewhat difficult Somewhat difficult  Some recent data might be hidden   PHQ-2/9 Result is negative.    Fall Risk: Fall Risk  05/11/2021 03/11/2021 01/06/2021 12/23/2020 10/29/2020  Falls in the past year? 0 0 0 0 0  Number falls in past yr: 0 0 0 0 0  Injury with Fall? - 0 0 0 0  Risk for fall due to : Impaired balance/gait - - Impaired balance/gait;Impaired mobility;Orthopedic patient -  Risk for fall due to: Comment - - - - -  Follow up - - - Falls prevention discussed Falls prevention discussed     Assessment & Plan  1. Dyslipidemia associated with type 2 diabetes mellitus ([place])  At goal, continue medications   2. Controlled type 2 diabetes mellitus with microalbuminuria, with long-term current use of insulin (HCC)  - POCT HgB A1C  3. Benign hypertension  At goal  - amLODipine (NORVASC) 2.5 MG tablet; Take 1 tablet  (2.5 mg total) by mouth at bedtime.  Dispense: 90 tablet; Refill: 0 - lisinopril (ZESTRIL) 40 MG tablet; Take 1 tablet (40 mg total) by mouth at bedtime.  Dispense: 90 tablet; Refill: 1  4. Low serum vitamin B12   5. Need for immunization against influenza  - Flu vaccine HIGH DOSE PF (Fluzone High dose)  6. Chronic pain syndrome  Getting steroid injections and had kyphoplasty  7. Osteoporosis, post-menopausal   8. Mild protein-calorie malnutrition ([place])  Doing better now, gaining weight   9. Adult hypothyroidism  - levothyroxine (SYNTHROID) 112 MCG tablet; Take 1 tablet (112 mcg total) by mouth daily. And half on Sundays  Dispense: 90 tablet; Refill: 1  10. Major depression, recurrent, chronic (HCC)  - sertraline (ZOLOFT) 50 MG tablet; Take 1 tablet (50 mg total) by mouth daily.  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  Dispense: 30 capsule; Refill: 2   I discussed the assessment  and treatment plan with the patient. The patient was provided an opportunity to ask questions and all were answered. The patient agreed with the plan and demonstrated an understanding of the instructions.  The patient was advised to call back or seek an in-person evaluation if the symptoms worsen or if the condition fails to improve as anticipated.  I provided 25 minutes of non-face-to-face time during this encounter.

## 2021-05-11 ENCOUNTER — Encounter: Payer: Self-pay | Admitting: Family Medicine

## 2021-05-11 ENCOUNTER — Telehealth (INDEPENDENT_AMBULATORY_CARE_PROVIDER_SITE_OTHER): Payer: PPO | Admitting: Family Medicine

## 2021-05-11 VITALS — BP 132/82 | HR 74 | Temp 98.1°F | Resp 16 | Ht 61.0 in | Wt 175.7 lb

## 2021-05-11 DIAGNOSIS — Z23 Encounter for immunization: Secondary | ICD-10-CM | POA: Diagnosis not present

## 2021-05-11 DIAGNOSIS — F5105 Insomnia due to other mental disorder: Secondary | ICD-10-CM

## 2021-05-11 DIAGNOSIS — Z794 Long term (current) use of insulin: Secondary | ICD-10-CM

## 2021-05-11 DIAGNOSIS — E785 Hyperlipidemia, unspecified: Secondary | ICD-10-CM

## 2021-05-11 DIAGNOSIS — G894 Chronic pain syndrome: Secondary | ICD-10-CM

## 2021-05-11 DIAGNOSIS — E441 Mild protein-calorie malnutrition: Secondary | ICD-10-CM | POA: Diagnosis not present

## 2021-05-11 DIAGNOSIS — I10 Essential (primary) hypertension: Secondary | ICD-10-CM

## 2021-05-11 DIAGNOSIS — E039 Hypothyroidism, unspecified: Secondary | ICD-10-CM

## 2021-05-11 DIAGNOSIS — E1129 Type 2 diabetes mellitus with other diabetic kidney complication: Secondary | ICD-10-CM | POA: Diagnosis not present

## 2021-05-11 DIAGNOSIS — R809 Proteinuria, unspecified: Secondary | ICD-10-CM | POA: Diagnosis not present

## 2021-05-11 DIAGNOSIS — M810 Age-related osteoporosis without current pathological fracture: Secondary | ICD-10-CM | POA: Diagnosis not present

## 2021-05-11 DIAGNOSIS — E538 Deficiency of other specified B group vitamins: Secondary | ICD-10-CM | POA: Diagnosis not present

## 2021-05-11 DIAGNOSIS — F339 Major depressive disorder, recurrent, unspecified: Secondary | ICD-10-CM | POA: Diagnosis not present

## 2021-05-11 DIAGNOSIS — E1169 Type 2 diabetes mellitus with other specified complication: Secondary | ICD-10-CM | POA: Diagnosis not present

## 2021-05-11 LAB — POCT GLYCOSYLATED HEMOGLOBIN (HGB A1C): Hemoglobin A1C: 7 % — AB (ref 4.0–5.6)

## 2021-05-11 MED ORDER — LEVOTHYROXINE SODIUM 112 MCG PO TABS: 112.0000 ug | ORAL_TABLET | Freq: Every day | ORAL | 1 refills | Status: DC

## 2021-05-11 MED ORDER — SERTRALINE HCL 50 MG PO TABS: 50.0000 mg | ORAL_TABLET | Freq: Every day | ORAL | 0 refills | Status: DC

## 2021-05-11 MED ORDER — LISINOPRIL 40 MG PO TABS: 40.0000 mg | ORAL_TABLET | Freq: Every day | ORAL | 1 refills | Status: DC

## 2021-05-11 MED ORDER — TEMAZEPAM 15 MG PO CAPS
15.0000 mg | ORAL_CAPSULE | Freq: Every evening | ORAL | 2 refills | Status: DC | PRN
Start: 2021-05-11 — End: 2021-12-10

## 2021-05-11 MED ORDER — AMLODIPINE BESYLATE 2.5 MG PO TABS: 2.5000 mg | ORAL_TABLET | Freq: Every evening | ORAL | 0 refills | Status: DC

## 2021-05-28 ENCOUNTER — Telehealth: Payer: Self-pay

## 2021-05-28 NOTE — Progress Notes (Signed)
Chronic Care Management Pharmacy Assistant   Name: Lindsey [name]  MRN: [id number] DOB: 27-Jun-1941  Reason for Encounter: Hypertension Disease State Call   Recent office visits:  05/11/2021 [name], MD (PCP Video Visit) for Follow-up- Discontinued: Letrozole 2.5 mg; lab order placed; no follow-up noted  Recent consult visits:  None ID  Hospital visits:  None in previous 6 months  Medications: Outpatient Encounter Medications as of 05/28/2021  Medication Sig   acetaminophen (TYLENOL) 325 MG tablet Take 2 tablets (650 mg total) by mouth every 6 (six) hours.   amLODipine (NORVASC) 2.5 MG tablet Take 1 tablet (2.5 mg total) by mouth at bedtime.   atorvastatin (LIPITOR) 40 MG tablet Take 1 tablet (40 mg total) by mouth daily.   B-D UF III MINI PEN NEEDLES 31G X 5 MM MISC USE AS DIRECTED BY DOCTOR, USE TO INJECT INSULIN   blood glucose meter kit and supplies Dispense based on patient and insurance preference. Use up to four times daily as directed. (FOR ICD-10 E10.9, E11.9).   calcium-vitamin D (OSCAL WITH D) 500-200 MG-UNIT tablet Take 1 tablet by mouth daily. 639m calcium 800 units Vitamin D   cholecalciferol (VITAMIN D) 1000 UNITS tablet Take by mouth.   ketoconazole (NIZORAL) 2 % cream Apply topically 2 (two) times daily.   Lancets (ONETOUCH DELICA PLUS [id number] MISC USE TO TEST UP TO FOUR TIMES A DAY AS DIRECTED   levothyroxine (SYNTHROID) 112 MCG tablet Take 1 tablet (112 mcg total) by mouth daily. And half on Sundays   lisinopril (ZESTRIL) 40 MG tablet Take 1 tablet (40 mg total) by mouth at bedtime.   ONETOUCH ULTRA test strip USE TO TEST UP TO FOUR TIMES A DAY   pregabalin (LYRICA) 75 MG capsule Take 1 capsule (75 mg total) by mouth 3 (three) times daily.   sertraline (ZOLOFT) 50 MG tablet Take 1 tablet (50 mg total) by mouth daily.   temazepam (RESTORIL) 15 MG capsule Take 1 capsule (15 mg total) by mouth at bedtime as needed for sleep.   tiZANidine (ZANAFLEX) 2 MG  tablet Take 1 tablet (2 mg total) by mouth every 8 (eight) hours as needed for muscle spasms.   TRESIBA FLEXTOUCH 100 UNIT/ML FlexTouch Pen INJECT 10 UNITS INTO THE SKIN ONCE DAILY   No facility-administered encounter medications on file as of 05/28/2021.   Care Gaps: COVID-19 Booster 3 Diabetic Eye Exam  Star Rating Drugs: Lisinopril 40 mg [facility] Atorvastatin 40 mg last filled [facility]2021 [facility]  Reviewed chart prior to disease state call. Spoke with patient regarding BP  Recent Office Vitals: BP Readings from Last 3 Encounters:  05/11/21 132/82  01/06/21 (!) 160/96  12/25/20 (!) 169/97   Pulse Readings from Last 3 Encounters:  05/11/21 74  01/06/21 97  12/25/20 76    Wt Readings from Last 3 Encounters:  05/11/21 175 lb 11.2 oz (79.7 kg)  01/06/21 165 lb (74.8 kg)  12/18/20 180 lb (81.6 kg)     Kidney Function Lab Results  Component Value Date/Time   CREATININE 0.84 01/06/2021 11:31 AM   CREATININE 0.77 09/12/2020 06:02 PM   CREATININE 0.92 (H) 03/31/2020 12:52 PM   GFRNONAA 65 01/06/2021 11:31 AM   GFRAA 76 01/06/2021 11:31 AM    BMP Latest Ref Rng & Units 01/06/2021 09/12/2020 03/31/2020  Glucose 65 - 99 mg/dL 157(H) 244(H) 250(H)  BUN 7 - 25  mg/dL 26(H) 18 20  Creatinine 0.60 - 0.88 mg/dL 0.84 0.77 0.92(H)  BUN/Creat Ratio 6 - 22 (calc) 31(H) - 22  Sodium 135 - 146 mmol/L 142 137 141  Potassium 3.5 - 5.3 mmol/L 4.1 4.0 4.4  Chloride 98 - 110 mmol/L 105 100 104  CO2 20 - 32 mmol/L '28 27 29  ' Calcium 8.6 - 10.4 mg/dL 9.8 8.9 9.4    Current antihypertensive regimen:  Lisinopril 40 mg 1 tablet daily  How often are you checking your Blood Pressure? several times per month  Current home BP readings: I spoke with her daughter [name] who is on her HIPAA, and she could not provide a reading, but stated as far as she is aware the patient hasn't complained about her  blood pressure being elevated.  What recent interventions/DTPs have been made by any provider to improve Blood Pressure control since last CPP Visit: None ID  Any recent hospitalizations or ED visits since last visit with CPP? No  What diet changes have been made to improve Blood Pressure Control?  [name] reports that the patient has a good appetite, and she pretty much will eat anything. She reports the patient is able to cook most of her meals on her own in her crockpot. [name] stated the patient does try to limit her fried food.   What exercise is being done to improve your Blood Pressure Control?  Patient has a lot of pain and has difficulty moving around, but per [facility] the patient has been doing more. [name] stated the patient said "her back pain isn't going to get any better" so she has chosen to push through the pain as much as she can. [name] reported that the patient was out in the yard doing a little yard work, and she has been doing more cooking on her own.   Adherence Review: Is the patient currently on ACE/ARB medication? Yes Does the patient have >5 day gap between last estimated fill dates? No  Per patient's daughter she reports that patient is over all doing well as she can be. She stated the patient hasn't complained about much lately, and has been a little more active. Patient is taking all of her medications, and [name] denies any ill symptoms regarding the patient. [name] reports that patient's blood sugar numbers have been normal as well. Per [name] no refills are needed as far as she knows. She reports her dad takes care of the medication for her mom so any refills needed the dad stays on top of it. There are no concerns are issues at this time.   Patient has an upcoming appointment with [name], CPP on 08/05/2021 @ [place], CPA/CMA Catering manager Phone: [phone]

## 2021-06-15 ENCOUNTER — Other Ambulatory Visit: Payer: Self-pay | Admitting: Family Medicine

## 2021-06-22 DIAGNOSIS — E113393 Type 2 diabetes mellitus with moderate nonproliferative diabetic retinopathy without macular edema, bilateral: Secondary | ICD-10-CM | POA: Diagnosis not present

## 2021-06-22 LAB — HM DIABETES EYE EXAM

## 2021-08-04 ENCOUNTER — Telehealth: Payer: Self-pay

## 2021-08-04 NOTE — Progress Notes (Signed)
° ° °  Chronic Care Management Pharmacy Assistant   Name: Shaunie Boehm [place]  MRN: [id number] DOB: [birth year]  Patient called to be reminded of her telephone appointment with [name], CPP on 12/14 @ 1300  Patient aware of appointment date, time, and type of appointment (either telephone or in person). Patient aware to have/bring all medications, supplements, blood pressure and/or blood sugar logs to visit.  Call the [phone] number when you call   Questions: Are there any concerns you would like to discuss during your office visit? Patient not sure  Are you having any problems obtaining your medications? Spoke with the daughter and she is unsure  Star Rating Drug: Lisinopril 40 mg last filled on 03/18/2021 for a 90-Day supply with [facility] Atorvastatin 40 mg last filled on 03/18/2021 for a 90-Day supply with [facility]  Any gaps in medications fill history? Yes  Care Gaps: HAFBX-03 Booster 3 Diabetic Eye Exam BP > 140/90  [name], CPA/CMA Clinical Pharmacist Assistant Phone: [phone]

## 2021-08-05 ENCOUNTER — Ambulatory Visit (INDEPENDENT_AMBULATORY_CARE_PROVIDER_SITE_OTHER): Payer: PPO

## 2021-08-05 ENCOUNTER — Other Ambulatory Visit: Payer: Self-pay | Admitting: Family Medicine

## 2021-08-05 DIAGNOSIS — I10 Essential (primary) hypertension: Secondary | ICD-10-CM

## 2021-08-05 DIAGNOSIS — Z794 Long term (current) use of insulin: Secondary | ICD-10-CM

## 2021-08-05 DIAGNOSIS — E1129 Type 2 diabetes mellitus with other diabetic kidney complication: Secondary | ICD-10-CM

## 2021-08-05 DIAGNOSIS — G8929 Other chronic pain: Secondary | ICD-10-CM

## 2021-08-05 MED ORDER — PREGABALIN 75 MG PO CAPS: 75.0000 mg | ORAL_CAPSULE | Freq: Three times a day (TID) | ORAL | 0 refills | Status: DC

## 2021-08-05 NOTE — Progress Notes (Signed)
Chronic Care Management Pharmacy Note  08/05/2021 Name:  Lindsey [name] MRN:  [id number] DOB:  [birth year]  Summary: Patient presents today for follow-up. Blood sugars mostly well controlled. She is not monitoring her blood pressure at home.   Patient reports poorly controlled pain, she has not been taking her pregabalin due to needing a refill.   Recommendations/Changes made from today's visit: Continue current medications  Plan: CPP follow-up in 6 months   Subjective: Lindsey [name] is an 80 y.o. year old female who is a primary patient of [name], MD.  The CCM team was consulted for assistance with disease management and care coordination needs.    Engaged with patient by telephone for follow up visit in response to provider referral for pharmacy case management and/or care coordination services.   Consent to Services:  The patient was given information about Chronic Care Management services, agreed to services, and gave verbal consent prior to initiation of services.  Please see initial visit note for detailed documentation.   Patient Care Team: [name], MD as PCP - General (Family Medicine) Melrose Nakayama, MD as Consulting Physician (Orthopedic Surgery) [name], MD as Attending Physician (Family Medicine) Germaine Pomfret, [facility] as Pharmacist (Pharmacist)  Recent office visits: 05/11/21: Video visit with Dr. Ancil Boozer for follow-up. A1c improved  to 7.0%. Letrozole stopped. 03/11/21: Video visit with Dr. Ancil Boozer for follow-up.  12/23/20: Patient presented to [name], LPN for AWV.   Recent consult visits: None in previous 6 months  Hospital visits: None in previous 6 months   Objective:  Lab Results  Component Value Date   CREATININE 0.84 01/06/2021   BUN 26 (H) 01/06/2021   GFRNONAA 65 01/06/2021   GFRAA 76 01/06/2021   NA 142 01/06/2021   K 4.1 01/06/2021   CALCIUM 9.8 01/06/2021   CO2 28 01/06/2021   GLUCOSE 157 (H) 01/06/2021    Lab  Results  Component Value Date/Time   HGBA1C 7.0 (A) 05/11/2021 10:37 AM   HGBA1C 7.5 (A) 01/06/2021 10:52 AM   HGBA1C 11.8 (H) 12/19/2019 11:34 AM   HGBA1C 7.8 (H) 11/27/2018 11:21 AM   MICROALBUR 8.0 01/06/2021 11:31 AM   MICROALBUR 0.7 12/26/2019 11:33 AM   MICROALBUR NEG 07/01/2016 10:10 AM    Last diabetic Eye exam: No results found for: HMDIABEYEEXA  Last diabetic Foot exam: No results found for: HMDIABFOOTEX   Lab Results  Component Value Date   CHOL 195 01/06/2021   HDL 55 01/06/2021   LDLCALC 115 (H) 01/06/2021   TRIG 133 01/06/2021   CHOLHDL 3.5 01/06/2021    Hepatic Function Latest Ref Rng & Units 01/06/2021 12/19/2019 11/27/2018  Total Protein 6.1 - 8.1 g/dL 7.4 6.8 6.7  Albumin 3.6 - 5.1 g/dL - - -  AST 10 - 35 U/L '17 16 16  ' ALT 6 - 29 U/L '18 17 15  ' Alk Phosphatase 33 - 130 U/L - - -  Total Bilirubin 0.2 - 1.2 mg/dL 0.9 0.8 0.8    Lab Results  Component Value Date/Time   TSH 3.59 01/06/2021 11:31 AM   TSH 0.92 08/12/2020 10:41 AM    CBC Latest Ref Rng & Units 01/06/2021 12/19/2020 09/12/2020  WBC 3.8 - 10.8 Thousand/uL 7.2 4.6 2.9(L)  Hemoglobin 11.7 - 15.5 g/dL 14.9 13.0 12.4  Hematocrit 35.0 - 45.0 % 45.8(H) 40.7 39.4  Platelets 140 - 400 Thousand/uL 233 208 150    Lab Results  Component Value Date/Time   VD25OH 40 11/24/2017 10:57 AM  VD25OH 42 09/30/2016 10:07 AM    Clinical ASCVD: No  The ASCVD Risk score (Arnett DK, et al., 2019) failed to calculate for the following reasons:   The 2019 ASCVD risk score is only valid for ages 12 to 43    Depression screen PHQ 2/9 05/11/2021 03/11/2021 01/06/2021  Decreased Interest 0 3 3  Down, Depressed, Hopeless 0 3 3  PHQ - 2 Score 0 6 6  Altered sleeping - 3 3  Tired, decreased energy - 3 3  Change in appetite - 0 3  Feeling bad or failure about yourself  - 3 3  Trouble concentrating - 1 3  Moving slowly or fidgety/restless - 0 1  Suicidal thoughts - 0 3  PHQ-9 Score - 16 25  Difficult doing work/chores -  - -  Some recent data might be hidden    Social History   Tobacco Use  Smoking Status Never  Smokeless Tobacco Never  Tobacco Comments   Non-smoker. Smoking cessation materials contraindicated   BP Readings from Last 3 Encounters:  05/11/21 132/82  01/06/21 (!) 160/96  12/25/20 (!) 169/97   Pulse Readings from Last 3 Encounters:  05/11/21 74  01/06/21 97  12/25/20 76   Wt Readings from Last 3 Encounters:  05/11/21 175 lb 11.2 oz (79.7 kg)  01/06/21 165 lb (74.8 kg)  12/18/20 180 lb (81.6 kg)   BMI Readings from Last 3 Encounters:  05/11/21 33.20 kg/m  01/06/21 31.18 kg/m  12/18/20 34.01 kg/m   -Last DEXA Scan: 04/30/2020    T-Score femoral neck: -2.7  T-Score total hip: -2.6  T-Score lumbar spine: -4.6  Assessment/Interventions: Review of patient past medical history, allergies, medications, health status, including review of consultants reports, laboratory and other test data, was performed as part of comprehensive evaluation and provision of chronic care management services.   SDOH:  (Social Determinants of Health) assessments and interventions performed: Yes SDOH Interventions    Flowsheet Row Most Recent Value  SDOH Interventions   Financial Strain Interventions Intervention Not Indicated       SDOH Screenings   Alcohol Screen: Low Risk    Last Alcohol Screening Score (AUDIT): 0  Depression (PHQ2-9): Low Risk    PHQ-2 Score: 0  Financial Resource Strain: Low Risk    Difficulty of Paying Living Expenses: Not hard at all  Food Insecurity: No Food Insecurity   Worried About Charity fundraiser in the Last Year: Never true   Ran Out of Food in the Last Year: Never true  Housing: Low Risk    Last Housing Risk Score: 0  Physical Activity: Inactive   Days of Exercise per Week: 0 days   Minutes of Exercise per Session: 0 min  Social Connections: Moderately Isolated   Frequency of Communication with Friends and Family: Three times a week   Frequency of  Social Gatherings with Friends and Family: Never   Attends Religious Services: Never   Marine scientist or Organizations: No   Attends Music therapist: Never   Marital Status: Married  Stress: Stress Concern Present   Feeling of Stress : Rather much  Tobacco Use: Low Risk    Smoking Tobacco Use: Never   Smokeless Tobacco Use: Never   Passive Exposure: Not on file  Transportation Needs: No Transportation Needs   Lack of Transportation (Medical): No   Lack of Transportation (Non-Medical): No    CCM Care Plan  No Known Allergies  Medications Reviewed Today  Reviewed by [name], MD (Physician) on 05/11/21 at 1344  Med List Status: <None>   Medication Order Taking? Sig Documenting Provider Last Dose Status Informant  acetaminophen (TYLENOL) 325 MG tablet [id number] Yes Take 2 tablets (650 mg total) by mouth every 6 (six) hours. Milly Jakob, MD Taking Active   amLODipine (NORVASC) 2.5 MG tablet [id number]  Take 1 tablet (2.5 mg total) by mouth at bedtime. [name], MD  Active   atorvastatin (LIPITOR) 40 MG tablet [id number] Yes Take 1 tablet (40 mg total) by mouth daily. [name], MD Taking Active   B-D UF III MINI PEN NEEDLES 31G X 5 MM MISC [id number] Yes USE AS DIRECTED BY DOCTOR, USE TO INJECT INSULIN [name], MD Taking Active   blood glucose meter kit and supplies [id number] Yes Dispense based on patient and insurance preference. Use up to four times daily as directed. (FOR ICD-10 E10.9, E11.9). [name], MD Taking Active   calcium-vitamin D (OSCAL WITH D) 500-200 MG-UNIT tablet [id number] Yes Take 1 tablet by mouth daily. 697m calcium 800 units Vitamin D [provider] Taking Active   cholecalciferol (VITAMIN D) 1000 UNITS tablet [id number] Take by mouth. [provider] Taking Active            Med Note (Gloris Ham KRoswell MinersD   Tue Dec 04, 2019  2:26 PM)    ketoconazole (NIZORAL) 2 % cream [id number] Apply  topically 2 (two) times daily. SSteele Sizer MD Taking Active   Lancets (ONETOUCH DELICA PLUS [id number] [id number] USE TO TEST UP TO FOUR TIMES A DAY AS DIRECTED [provider] Taking Active   levothyroxine (SYNTHROID) 112 MCG tablet [id number] Take 1 tablet (112 mcg total) by mouth daily. And half on Sundays SSteele Sizer MD  Active   lisinopril (ZESTRIL) 40 MG tablet [id number] Take 1 tablet (40 mg total) by mouth at bedtime. SSteele Sizer MD  Active   [facility] LLCULTRA test strip [id number] USE TO TEST UP TO FOUR TIMES A DEloise Harman MD Taking Active   pregabalin (LYRICA) 75 MG capsule [id number] Take 1 capsule (75 mg total) by mouth 3 (three) times daily. SSteele Sizer MD Taking Active   sertraline (ZOLOFT) 50 MG tablet [id number] Take 1 tablet (50 mg total) by mouth daily. SSteele Sizer MD  Active   temazepam (RESTORIL) 15 MG capsule [id number] Take 1 capsule (15 mg total) by mouth at bedtime as needed for sleep. SSteele Sizer MD  Active   tiZANidine (ZANAFLEX) 2 MG tablet [id number] Take 1 tablet (2 mg total) by mouth every 8 (eight) hours as needed for muscle spasms. SSteele Sizer MD Taking Active   TRESIBA FLEXTOUCH 100 UNIT/ML FlexTouch Pen [id number] INJECT 10 UNITS INTO THE SKIN ONCE DAILY SSteele Sizer MD Taking Active             Patient Active Problem List   Diagnosis Date Noted   Primary osteoarthritis of both knees 01/06/2021   Low serum vitamin B12 01/06/2021   Chronic bilateral low back pain with right-sided sciatica 01/06/2021   Dyslipidemia associated with type 2 diabetes mellitus ([place] 01/06/2021   Mild protein-calorie malnutrition ([place] 01/06/2021   Chronic pain syndrome 01/06/2021   Diarrhea 10/28/2020   Incontinence of feces 10/28/2020   History of breast cancer 06/28/2018   Bilateral lower extremity edema 01/16/2016   Breast cancer, left breast ([place] 11/05/2015   Osteoporosis, 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    Controlled type 2  diabetes mellitus with microalbuminuria ([place]) 04/25/2015   Hyperlipidemia 04/25/2015   Benign hypertension 04/25/2015   Adult hypothyroidism 04/25/2015   At risk for falling 04/25/2015   Major depression ([place]) 04/25/2015   Post menopausal syndrome 04/25/2015    Immunization History  Administered Date(s) Administered   Fluad Quad(high Dose 65+) 05/30/2019, 06/23/2020   Influenza, High Dose Seasonal PF 06/12/2014, 06/09/2015, 07/01/2016, 04/29/2017, 06/28/2018, 05/11/2021   Influenza,inj,Quad PF,6+ Mos 07/23/2013   PFIZER(Purple Top)SARS-COV-2 Vaccination 09/29/2019, 10/20/2019   Pneumococcal Conjugate-13 08/13/2014   Pneumococcal Polysaccharide-23 07/29/2017   Tdap 06/23/2020    Conditions to be addressed/monitored:  Hypertension, Hyperlipidemia, Diabetes, Hypothyroidism, Osteoporosis, and Chronic Pain  Care Plan : General Pharmacy (Adult)  Updates made by Germaine Pomfret, RPH since 08/05/2021 12:00 AM     Problem: Hypertension, Hyperlipidemia, Diabetes, Hypothyroidism, Osteoporosis, and Chronic Pain   Priority: High     Long-Range Goal: Patient-Specific Goal   Start Date: 02/04/2021  Expected End Date: 03/04/2022  This Visit's Progress: On track  Recent Progress: On track  Priority: High  Note:   Current Barriers:  Unable to achieve control of osteoporosis   Pharmacist Clinical Goal(s):  Patient will achieve control of osteoporosis as evidenced by prevention of bone fractures maintain control of diabetes as evidenced by A1c less than 8%  through collaboration with PharmD and provider.   Interventions: 1:1 collaboration with [name], MD regarding development and update of comprehensive plan of care as evidenced by provider attestation and co-signature Inter-disciplinary care team collaboration (see longitudinal plan of care) Comprehensive medication review performed; medication list updated in electronic medical record  Hypertension (BP  goal <140/90) -Uncontrolled -Current treatment: Amlodipine 2.5 mg nightly Lisinopril 40 mg daily  -Medications previously tried: NA  -Current home readings: NA -Denies hypotensive/hypertensive symptoms -Counseled to monitor BP at home weekly, document, and provide log at future appointments -Recommended to continue current medication  Hyperlipidemia: (LDL goal < 100) -Controlled -Current treatment: Atorvastatin 40 mg daily  -Medications previously tried: NA  -Recommended to continue current medication  Diabetes (A1c goal <8%) -Controlled -Current medications: Tresiba 10 units daily  -Medications previously tried: Metformin, Januvia,Glipizide   -Current home glucose readings fasting glucose:  Before Supper: 171, 131 151, 121, one instance of 205 (thinks diet-related) -Denies hypoglycemic/hyperglycemic symptoms -Recommended to continue current medication  Osteoporosis / Osteopenia (Goal Prevent bone fractures) -Controlled -Patient is a candidate for pharmacologic treatment due to T-Score < -2.5 in femoral neck, T-Score < -2.5 in total hip , and T-Score < -2.5 in lumbar spine -Current treatment  Calcium + D 600-800 daily  -Medications previously tried: Alendronate (ineffective), Prolia (cost)  -Recommended to continue current medication  Depression/Anxiety (Goal: Maintain stable mood) -Uncontrolled -Current treatment: Sertraline 50 mg daily  -Medications previously tried/failed: NA -PHQ9: 25 -Recommended cognitive behavioral therapy, patient will consider -Recommended to continue current medication  Hypothyroidism (Goal: Maintain stable thyroid function) -Controlled -Current treatment  Levothyroxine 112 mcg 1/2 tablet on Sunday, 1 tablet all other days  -Medications previously tried: NA  -Recommended to continue current medication  Patient Goals/Self-Care Activities Patient will:  - check glucose daily before breakfast, document, and provide at future  appointments check blood pressure weekly, document, and provide at future appointments  Follow Up Plan: Telephone follow up appointment with care management team member scheduled for:  01/27/2022 at 3:00 PM       Medication Assistance: None required.  Patient affirms current coverage meets needs.  Compliance/Adherence/Medication fill history: Care Gaps: Shingrix  Covid Booster  Star-Rating Drugs: Atorvastatin 40 mg last filled on 03/18/2021 for 90 day supply at [facility] Lisinopril 40 mg last filled on 03/18/2021 for 90 day supply at [facility].  Patient's preferred pharmacy is:  [facility] [place], [place] - [place] [address] [place] [address] Phone: [phone] Fax: [phone]  Uses pill box? Yes Pt endorses 100% compliance  We discussed: Current pharmacy is preferred with insurance plan and patient is satisfied with pharmacy services Patient decided to: Continue current medication management strategy  Care Plan and Follow Up Patient Decision:  Patient agrees to Care Plan and Follow-up.  Plan: Telephone follow up appointment with care management team member scheduled for:  01/27/2022 at 3:00 PM  Malva Limes, [place] Pharmacist Practitioner  [facility] [phone]

## 2021-08-05 NOTE — Patient Instructions (Signed)
Visit Information It was great speaking with you today!  Please let me know if you have any questions about our visit.   Goals Addressed             This Visit's Progress    Monitor and Manage My Blood Sugar-Diabetes Type 2   On track    Timeframe:  Long-Range Goal Priority:  High Start Date: 02/02/2021                            Expected End Date: 02/02/2022                      Follow Up within 90 days   - check blood sugar at prescribed times - check blood sugar if I feel it is too high or too low - enter blood sugar readings and medication or insulin into daily log    Why is this important?   Checking your blood sugar at home helps to keep it from getting very high or very low.  Writing the results in a diary or log helps the doctor know how to care for you.  Your blood sugar log should have the time, date and the results.  Also, write down the amount of insulin or other medicine that you take.  Other information, like what you ate, exercise done and how you were feeling, will also be helpful.     Notes:         Patient Care Plan: General Pharmacy (Adult)     Problem Identified: Hypertension, Hyperlipidemia, Diabetes, Hypothyroidism, Osteoporosis, and Chronic Pain   Priority: High     Long-Range Goal: Patient-Specific Goal   Start Date: 02/04/2021  Expected End Date: 03/04/2022  This Visit's Progress: On track  Recent Progress: On track  Priority: High  Note:   Current Barriers:  Unable to achieve control of osteoporosis   Pharmacist Clinical Goal(s):  Patient will achieve control of osteoporosis as evidenced by prevention of bone fractures maintain control of diabetes as evidenced by A1c less than 8%  through collaboration with PharmD and provider.   Interventions: 1:1 collaboration with [name], MD regarding development and update of comprehensive plan of care as evidenced by provider attestation and co-signature Inter-disciplinary care team  collaboration (see longitudinal plan of care) Comprehensive medication review performed; medication list updated in electronic medical record  Hypertension (BP goal <140/90) -Uncontrolled -Current treatment: Amlodipine 2.5 mg nightly Lisinopril 40 mg daily  -Medications previously tried: NA  -Current home readings: NA -Denies hypotensive/hypertensive symptoms -Counseled to monitor BP at home weekly, document, and provide log at future appointments -Recommended to continue current medication  Hyperlipidemia: (LDL goal < 100) -Controlled -Current treatment: Atorvastatin 40 mg daily  -Medications previously tried: NA  -Recommended to continue current medication  Diabetes (A1c goal <8%) -Controlled -Current medications: Tresiba 10 units daily  -Medications previously tried: Metformin, Januvia,Glipizide   -Current home glucose readings fasting glucose:  Before Supper: 171, 131 151, 121, one instance of 205 (thinks diet-related) -Denies hypoglycemic/hyperglycemic symptoms -Recommended to continue current medication  Osteoporosis / Osteopenia (Goal Prevent bone fractures) -Controlled -Patient is a candidate for pharmacologic treatment due to T-Score < -2.5 in femoral neck, T-Score < -2.5 in total hip , and T-Score < -2.5 in lumbar spine -Current treatment  Calcium + D 600-800 daily  -Medications previously tried: Alendronate (ineffective), Prolia (cost)  -Recommended to continue current medication  Depression/Anxiety (Goal: Maintain stable mood) -  Uncontrolled -Current treatment: Sertraline 50 mg daily  -Medications previously tried/failed: NA -PHQ9: 25 -Recommended cognitive behavioral therapy, patient will consider -Recommended to continue current medication  Hypothyroidism (Goal: Maintain stable thyroid function) -Controlled -Current treatment  Levothyroxine 112 mcg 1/2 tablet on Sunday, 1 tablet all other days  -Medications previously tried: NA  -Recommended to  continue current medication  Patient Goals/Self-Care Activities Patient will:  - check glucose daily before breakfast, document, and provide at future appointments check blood pressure weekly, document, and provide at future appointments  Follow Up Plan: Telephone follow up appointment with care management team member scheduled for:  01/27/2022 at 3:00 PM    Patient agreed to services and verbal consent obtained.   Patient verbalizes understanding of instructions provided today and agrees to view in [place].   Malva Limes, [place] Pharmacist Practitioner  [facility] [phone]

## 2021-08-12 ENCOUNTER — Other Ambulatory Visit: Payer: Self-pay | Admitting: Family Medicine

## 2021-08-22 DIAGNOSIS — R809 Proteinuria, unspecified: Secondary | ICD-10-CM | POA: Diagnosis not present

## 2021-08-22 DIAGNOSIS — Z794 Long term (current) use of insulin: Secondary | ICD-10-CM | POA: Diagnosis not present

## 2021-08-22 DIAGNOSIS — I10 Essential (primary) hypertension: Secondary | ICD-10-CM

## 2021-08-22 DIAGNOSIS — E1129 Type 2 diabetes mellitus with other diabetic kidney complication: Secondary | ICD-10-CM

## 2021-10-13 ENCOUNTER — Telehealth: Payer: Self-pay

## 2021-10-13 NOTE — Progress Notes (Signed)
° ° °  Chronic Care Management Pharmacy Assistant   Name: Maylynn Orzechowski Shepler  MRN: [id number] DOB: 12/10/1940  Patient's daughter called and left a voicemail as she stated the patient wanted to make sure it was okay for her to start taking an Omega XL. I sent her a message back stating I would speak with the CPP and let her know.  I sent a message to [name], CPP asking if it was okay if the patient started taking an Omega XL. Per CPP he recommends Omega-3 fish oil from the pharmacy as it is cheaper. The patient was notified.   Medications: Outpatient Encounter Medications as of 10/13/2021  Medication Sig   acetaminophen (TYLENOL) 325 MG tablet Take 2 tablets (650 mg total) by mouth every 6 (six) hours.   amLODipine (NORVASC) 2.5 MG tablet Take 1 tablet (2.5 mg total) by mouth at bedtime.   atorvastatin (LIPITOR) 40 MG tablet Take 1 tablet (40 mg total) by mouth daily.   B-D UF III MINI PEN NEEDLES 31G X 5 MM MISC USE AS DIRECTED BY DISCARD REMAINDER TO INJECT INSULIN   blood glucose meter kit and supplies Dispense based on patient and insurance preference. Use up to four times daily as directed. (FOR ICD-10 E10.9, E11.9).   calcium-vitamin D (OSCAL WITH D) 500-200 MG-UNIT tablet Take 1 tablet by mouth daily. $RemoveBefo'600mg'tCOPzUsZFdD$  calcium 800 units Vitamin D   cholecalciferol (VITAMIN D) 1000 UNITS tablet Take by mouth.   ketoconazole (NIZORAL) 2 % cream Apply topically 2 (two) times daily.   Lancets (ONETOUCH DELICA PLUS [id number]) MISC USE TO TEST UP TO FOUR TIMES A DAY AS DIRECTED   levothyroxine (SYNTHROID) 112 MCG tablet Take 1 tablet (112 mcg total) by mouth daily. And half on Sundays   lisinopril (ZESTRIL) 40 MG tablet Take 1 tablet (40 mg total) by mouth at bedtime.   ONETOUCH ULTRA test strip USE TO TEST UP TO FOUR TIMES A DAY   pregabalin (LYRICA) 75 MG capsule Take 1 capsule (75 mg total) by mouth 3 (three) times daily.   sertraline (ZOLOFT) 50 MG tablet Take 1 tablet (50 mg total) by mouth daily.    temazepam (RESTORIL) 15 MG capsule Take 1 capsule (15 mg total) by mouth at bedtime as needed for sleep.   tiZANidine (ZANAFLEX) 2 MG tablet Take 1 tablet (2 mg total) by mouth every 8 (eight) hours as needed for muscle spasms. (Patient not taking: Reported on 08/05/2021)   TRESIBA FLEXTOUCH 100 UNIT/ML FlexTouch Pen INJECT 10 UNITS INTO THE SKIN ONCE DAILY   No facility-administered encounter medications on file as of 10/13/2021.    [name], CPA/CMA Clinical Pharmacist Assistant Phone: [phone]

## 2021-11-02 ENCOUNTER — Other Ambulatory Visit: Payer: Self-pay | Admitting: Family Medicine

## 2021-11-02 DIAGNOSIS — F339 Major depressive disorder, recurrent, unspecified: Secondary | ICD-10-CM

## 2021-11-02 NOTE — Telephone Encounter (Signed)
Pt has an appt with Dr Rosana Berger due to needing to be seen the week you are out of the office, daughter would like for meds to be called in and would like a call when its done ?

## 2021-11-02 NOTE — Telephone Encounter (Signed)
Patient's daughter, [name], was notified as requested.

## 2021-11-03 ENCOUNTER — Telehealth: Payer: Self-pay

## 2021-11-03 NOTE — Progress Notes (Signed)
? ? ?Chronic Care Management ?Pharmacy Assistant  ? ?Name: Lindsey [name]  MRN: [id number] DOB: Jan 22, 1941 ? ?Reason for Encounter: Diabetes Disease State Call ?  ?Recent office visits:  ?None ID ? ?Recent consult visits:  ?None ID ? ?Hospital visits:  ?None in previous 6 months ? ?Medications: ?Outpatient Encounter Medications as of 11/03/2021  ?Medication Sig  ? acetaminophen (TYLENOL) 325 MG tablet Take 2 tablets (650 mg total) by mouth every 6 (six) hours.  ? amLODipine (NORVASC) 2.5 MG tablet Take 1 tablet (2.5 mg total) by mouth at bedtime.  ? atorvastatin (LIPITOR) 40 MG tablet Take 1 tablet (40 mg total) by mouth daily.  ? B-D UF III MINI PEN NEEDLES 31G X 5 MM MISC USE AS DIRECTED BY DISCARD REMAINDER TO INJECT INSULIN  ? blood glucose meter kit and supplies Dispense based on patient and insurance preference. Use up to four times daily as directed. (FOR ICD-10 E10.9, E11.9).  ? calcium-vitamin D (OSCAL WITH D) 500-200 MG-UNIT tablet Take 1 tablet by mouth daily. 637m calcium 800 units Vitamin D  ? cholecalciferol (VITAMIN D) 1000 UNITS tablet Take by mouth.  ? ketoconazole (NIZORAL) 2 % cream Apply topically 2 (two) times daily.  ? Lancets (ONETOUCH DELICA PLUS [id number] MISC USE TO TEST UP TO FOUR TIMES A DAY AS DIRECTED  ? levothyroxine (SYNTHROID) 112 MCG tablet Take 1 tablet (112 mcg total) by mouth daily. And half on Sundays  ? lisinopril (ZESTRIL) 40 MG tablet Take 1 tablet (40 mg total) by mouth at bedtime.  ? ONETOUCH ULTRA test strip USE TO TEST UP TO FOUR TIMES A DAY  ? pregabalin (LYRICA) 75 MG capsule Take 1 capsule (75 mg total) by mouth 3 (three) times daily.  ? sertraline (ZOLOFT) 50 MG tablet TAKE 1 TABLET(50 MG) BY MOUTH DAILY  ? temazepam (RESTORIL) 15 MG capsule Take 1 capsule (15 mg total) by mouth at bedtime as needed for sleep.  ? tiZANidine (ZANAFLEX) 2 MG tablet Take 1 tablet (2 mg total) by mouth every 8 (eight) hours as needed for muscle spasms. (Patient not taking: Reported on  08/05/2021)  ? TRESIBA FLEXTOUCH 100 UNIT/ML FlexTouch Pen INJECT 10 UNITS INTO THE SKIN ONCE DAILY  ? ?No facility-administered encounter medications on file as of 11/03/2021.  ? ?Care Gaps: ?Zoster Vaccine ?COVID-19 Vaccine Booster 3 ?Diabetic Eye Exam ? ?Star Rating Drugs: ?Atorvastatin 40 mg last filled on 08/19/2021 for a 90-Day supply with [facility] ?Lisinopril 40 mg last filled on 08/19/2021 for a 90-Day supply with [facility] ? ?Recent Relevant Labs: ?Lab Results  ?Component Value Date/Time  ? HGBA1C 7.0 (A) 05/11/2021 10:37 AM  ? HGBA1C 7.5 (A) 01/06/2021 10:52 AM  ? HGBA1C 11.8 (H) 12/19/2019 11:34 AM  ? HGBA1C 7.8 (H) 11/27/2018 11:21 AM  ? MICROALBUR 8.0 01/06/2021 11:31 AM  ? MICROALBUR 0.7 12/26/2019 11:33 AM  ? MICROALBUR NEG 07/01/2016 10:10 AM  ?  ?Kidney Function ?Lab Results  ?Component Value Date/Time  ? CREATININE 0.84 01/06/2021 11:31 AM  ? CREATININE 0.77 09/12/2020 06:02 PM  ? CREATININE 0.92 (H) 03/31/2020 12:52 PM  ? GFRNONAA 65 01/06/2021 11:31 AM  ? GFRAA 76 01/06/2021 11:31 AM  ? ?Current antihyperglycemic regimen:  ?Tresiba 100 unit Inject 10 units daily ? ?What recent interventions/DTPs have been made to improve glycemic control:  ?None ID ? ?Have there been any recent hospitalizations or ED visits since last visit with CPP? No ? ?Patient denies hypoglycemic symptoms, including Pale, Sweaty, Shaky, Hungry, Nervous/irritable,  and Vision changes ? ?Patient denies hyperglycemic symptoms, including blurry vision, excessive thirst, fatigue, polyuria, and weakness ? ?How often are you checking your blood sugar? once daily ?What are your blood sugars ranging?  ?Fasting: 120-170  ? ?During the week, how often does your blood glucose drop below 70? Never ?Are you checking your feet daily/regularly? Sometimes  ? ?Adherence Review: ?Is the patient currently on a STATIN medication? Yes ?Is the patient currently on ACE/ARB medication? Yes ?Does the patient have >5 day gap between last  estimated fill dates? No ? ?The patient's daughter Lindsey [name] (who is on HIPAA) reports that the patient is doing well. She denies any ill symptoms with the patient at this time. She reports that the patient does have a PCP appointment on Friday for a follow-up and medication refills. Lindsey [name] advises that the patient's blood sugar numbers, and her blood pressure has been normal. She stated that the patient still has a lot of pain, and is currently taking Lyrica to help assist with that pain. Per Lindsey [name] the patient doesn't due much due to the pain but Lindsey [name] feels that she can move around more than what she does as she does things if she wants to do them. Lindsey [name] advised that the patient has been doing laundry which is new for her. Lindsey [name] denies the patient having any falls due to this pain, and she did advise the patient doesn't have the pain everyday, but most days. Lindsey [name] states that for the patient is taking her medications with no issues. There are no other concerns or issues at this time.  ? ?Patient has a scheduled telephone appointment with [name], CPP on 01/27/2022 @ 1500 ?Patient has a scheduled AWV 12/24/2021 @ 1530 ? ?[name], CPA/CMA ?Clinical Pharmacist Assistant ?Phone: [phone]  ? ? ? ? ? ?

## 2021-11-06 ENCOUNTER — Encounter: Payer: Self-pay | Admitting: Internal Medicine

## 2021-11-06 ENCOUNTER — Other Ambulatory Visit: Payer: Self-pay

## 2021-11-06 ENCOUNTER — Ambulatory Visit (INDEPENDENT_AMBULATORY_CARE_PROVIDER_SITE_OTHER): Payer: PPO | Admitting: Internal Medicine

## 2021-11-06 VITALS — BP 128/80 | HR 86 | Temp 98.1°F | Resp 16 | Ht 61.0 in | Wt 181.5 lb

## 2021-11-06 DIAGNOSIS — I10 Essential (primary) hypertension: Secondary | ICD-10-CM

## 2021-11-06 DIAGNOSIS — E039 Hypothyroidism, unspecified: Secondary | ICD-10-CM | POA: Diagnosis not present

## 2021-11-06 DIAGNOSIS — G8929 Other chronic pain: Secondary | ICD-10-CM

## 2021-11-06 DIAGNOSIS — E1169 Type 2 diabetes mellitus with other specified complication: Secondary | ICD-10-CM

## 2021-11-06 DIAGNOSIS — E1129 Type 2 diabetes mellitus with other diabetic kidney complication: Secondary | ICD-10-CM | POA: Diagnosis not present

## 2021-11-06 DIAGNOSIS — E785 Hyperlipidemia, unspecified: Secondary | ICD-10-CM

## 2021-11-06 DIAGNOSIS — Z853 Personal history of malignant neoplasm of breast: Secondary | ICD-10-CM

## 2021-11-06 DIAGNOSIS — M5441 Lumbago with sciatica, right side: Secondary | ICD-10-CM

## 2021-11-06 DIAGNOSIS — Z794 Long term (current) use of insulin: Secondary | ICD-10-CM

## 2021-11-06 DIAGNOSIS — F331 Major depressive disorder, recurrent, moderate: Secondary | ICD-10-CM

## 2021-11-06 DIAGNOSIS — R809 Proteinuria, unspecified: Secondary | ICD-10-CM

## 2021-11-06 DIAGNOSIS — M170 Bilateral primary osteoarthritis of knee: Secondary | ICD-10-CM

## 2021-11-06 MED ORDER — ONETOUCH ULTRA VI STRP: ORAL_STRIP | 2 refills | Status: DC

## 2021-11-06 MED ORDER — CELECOXIB 100 MG PO CAPS: 100.0000 mg | ORAL_CAPSULE | Freq: Two times a day (BID) | ORAL | 1 refills | Status: DC

## 2021-11-06 MED ORDER — ONETOUCH DELICA PLUS LANCET30G MISC: 1 refills | Status: DC

## 2021-11-06 MED ORDER — LEVOTHYROXINE SODIUM 112 MCG PO TABS: 112.0000 ug | ORAL_TABLET | Freq: Every day | ORAL | 1 refills | Status: DC

## 2021-11-06 MED ORDER — AMLODIPINE BESYLATE 2.5 MG PO TABS: 2.5000 mg | ORAL_TABLET | Freq: Every evening | ORAL | 0 refills | Status: DC

## 2021-11-06 MED ORDER — BD PEN NEEDLE MINI U/F 31G X 5 MM MISC: 3 refills | Status: DC

## 2021-11-06 MED ORDER — TRESIBA FLEXTOUCH 100 UNIT/ML ~~LOC~~ SOPN: PEN_INJECTOR | SUBCUTANEOUS | 0 refills | Status: DC

## 2021-11-06 MED ORDER — LISINOPRIL 40 MG PO TABS: 40.0000 mg | ORAL_TABLET | Freq: Every day | ORAL | 1 refills | Status: DC

## 2021-11-06 NOTE — Assessment & Plan Note (Signed)
Stable, continue current medications.  

## 2021-11-06 NOTE — Patient Instructions (Addendum)
It was great seeing you today! ? ?Plan discussed at today's visit: ?-Blood work ordered today, results will be uploaded to [facility].  ?-Refills sent ?-Stop Lyrica ?-Start Celebrex 100 mg twice a day - take with food and do NOT take with any other anti-inflammatories, like Aleve, Advil, Motrin, etc. Can take Tylenol  ?-Monitor for blood in the stools or dark colored stools, stop medication and let me know right away  ?-Let me know if you change your mind about pain management  ? ?Follow up in: 2 months  ? ?Take care and let us know if you have any questions or concerns prior to your next visit. ? ?Dr. Rosana Berger ? ?

## 2021-11-06 NOTE — Assessment & Plan Note (Signed)
Following with Ortho for injections in knees, trial Celebrex for pain. No history of GI bleeds, not on anti-coagulation. Discussed risks of medication, patient voices understanding. Recheck at follow up.  ?

## 2021-11-06 NOTE — Assessment & Plan Note (Signed)
Check A1c today, insulin and diabetic supplies refilled today. ?

## 2021-11-06 NOTE — Assessment & Plan Note (Signed)
Having some anxiety but overall doing well on Zoloft 50. Continue current dose. ?

## 2021-11-06 NOTE — Assessment & Plan Note (Signed)
No longer following with Oncology.  ?

## 2021-11-06 NOTE — Progress Notes (Signed)
Established Patient Office Visit  Subjective:  Patient ID: Lindsey [name], female    DOB: Jul 20, 1941  Age: 81 y.o. MRN: [id number]  CC:  Chief Complaint  Patient presents with   Follow-up   Hypertension   Diabetes   Hypothyroidism   Depression    HPI Lindsey [name] presents for follow up on chronic medical conditions.  Diabetes, Type 2: -Last A1c 9/22 7.0% -Medications: Tresiba 10 units at night  -Patient is compliant with the above medications and reports no side effects.  -Checking BG at home: only checks at night - 137- 253 -Lowest home BG since last visit: 89, felt ok then  -Eye exam: 1/23 -Foot exam: 5/22 -Microalbumin: 5/22, on ACEI -Statin: yes -PNA vaccine: 23 in 2018 -Denies symptoms of hypoglycemia, polyuria, polydipsia, numbness extremities, foot ulcers/trauma.   Hypertension: -Medications: Amlodipine 2.5, Lisinopril 40,  -Patient is compliant with above medications and reports no side effects. -Checking BP at home (average): not checking  -Denies any SOB, CP, vision changes, LE edema or symptoms of hypotension  HLD: -Medications: Lipitor 40 -Patient is compliant with above medications and reports no side effects.  -Last lipid panel: Lipid Panel     Component Value Date/Time   CHOL 195 01/06/2021 1131   CHOL 151 12/29/2015 0954   TRIG 133 01/06/2021 1131   HDL 55 01/06/2021 1131   HDL 53 12/29/2015 0954   CHOLHDL 3.5 01/06/2021 1131   VLDL 16 01/27/2017 1128   LDLCALC 115 (H) 01/06/2021 1131   LABVLDL 24 12/29/2015 0954    Hypothyroidism: -Medications: Levothyroxine 112 -Patient is compliant with the above medication (s) at the above dose and reports no medication side effects.  -Denies weight changes, cold./heat intolerance, skin changes, anxiety/palpitations  -Last TSH: 5/22 3.59  MDD: -Mood status: stable -Current treatment: Zoloft 50 -Satisfied with current treatment?: yes  Hx of Breast Cancer: -Had been seeing Oncology but lost to follow  up  -Bilateral mastectomy in 2017, no radiation/chemo   Osteoporosis/OA: -Following with Ortho, underwent vertebroplasty last [place], following with them for injections  -In chronic pain, very much affecting life and activity -Takes Tylenol occasionally   Health Maintenance: -Blood work due in [place], A1c today   Past Medical History:  Diagnosis Date   Anxiety    Arthritis    Breast cancer (HCC) 1983   right breast cancer   Breast cancer in female [facility]) 11-04-15   Left Breast INVASIVE MAMMARY CARCINOMA  T1c, 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   Bursitis    R knee   Closed fracture of left ulna and radius    Complication of anesthesia    Depression    Diabetes mellitus type 2, controlled, without complications (HCC) 04/25/2015   Diabetes mellitus without complication (HCC)    GERD (gastroesophageal reflux disease)    TUMS   Hyperlipidemia    Hypertension    Hypothyroidism    Osteoporosis    Osteoporosis, 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   PONV (postoperative nausea and vomiting)    Thyroid disease     Past Surgical History:  Procedure Laterality Date   BREAST BIOPSY Left 11-04-15   BREAST BIOPSY Left 10-10-07   CATARACT EXTRACTION Bilateral 1990   CHOLECYSTECTOMY     IR KYPHO EA ADDL LEVEL THORACIC OR LUMBAR  12/25/2020   IR KYPHO LUMBAR INC FX REDUCE BONE BX UNI/BIL CANNULATION INC/IMAGING  12/25/2020   IR RADIOLOGIST EVAL & MGMT  12/17/2020   IR VERTEBROPLASTY LUMBAR BX INC UNI/BIL INC/INJECT/IMAGING  12/25/2020  MASTECTOMY Right 1983   Dr Isac Sarna   MASTECTOMY Right 1983   MASTECTOMY W/ SENTINEL NODE BIOPSY Left 12/12/2015   Procedure: MASTECTOMY WITH SENTINEL LYMPH NODE BIOPSY;  Surgeon: Kieth Brightly, MD;  Location: [facility];  Service: General;  Laterality: Left;   ORIF ULNAR FRACTURE Left 12/11/2018   Procedure: OPEN REDUCTION INTERNAL FIXATION (ORIF) LEFT PROXIMAL ULNA FRACTURE AND  RADIAL HEAD REPLACEMENT;  Surgeon: Mack Hook, MD;  Location: [facility];   Service: Orthopedics;  Laterality: Left;   RADIAL HEAD ARTHROPLASTY Left 12/11/2018   Procedure: OPEN REDUCTION INTERNAL FIXATION (ORIF) LEFT PROXIMAL ULNA FRACTURE AND  RADIAL HEAD REPLACEMENT;  Surgeon: Mack Hook, MD;  Location:  SURGERY CENTER;  Service: Orthopedics;  Laterality: Left;    Family History  Problem Relation Age of Onset   Kidney disease Mother    Heart disease Father    Stroke Sister    Cancer Sister        breast   Breast cancer Sister    Stroke Brother    Diabetes Brother    Hypertension Brother    Cancer Daughter 18       DCIS/lumpectomy/genetic negative   Breast cancer Daughter     Social History   Socioeconomic History   Marital status: Married    Spouse name: Not on file   Number of children: 3   Years of education: Not on file   Highest education level: Not on file  Occupational History   Occupation: Retired  Tobacco Use   Smoking status: Never   Smokeless tobacco: Never   Tobacco comments:    Non-smoker. Smoking cessation materials contraindicated  Vaping Use   Vaping Use: Never used  Substance and Sexual Activity   Alcohol use: No    Alcohol/week: 0.0 standard drinks   Drug use: No   Sexual activity: Never  Other Topics Concern   Not on file  Social History Narrative   Not on file   Social Determinants of Health   Financial Resource Strain: Low Risk    Difficulty of Paying Living Expenses: Not hard at all  Food Insecurity: No Food Insecurity   Worried About Programme researcher, broadcasting/film/video in the Last Year: Never true   Ran Out of Food in the Last Year: Never true  Transportation Needs: No Transportation Needs   Lack of Transportation (Medical): No   Lack of Transportation (Non-Medical): No  Physical Activity: Inactive   Days of Exercise per Week: 0 days   Minutes of Exercise per Session: 0 min  Stress: Stress Concern Present   Feeling of Stress : Rather much  Social Connections: Moderately Isolated   Frequency of  Communication with Friends and Family: Three times a week   Frequency of Social Gatherings with Friends and Family: Never   Attends Religious Services: Never   Database administrator or Organizations: No   Attends Engineer, structural: Never   Marital Status: Married  Catering manager Violence: Not At Risk   Fear of Current or Ex-Partner: No   Emotionally Abused: No   Physically Abused: No   Sexually Abused: No    Outpatient Medications Prior to Visit  Medication Sig Dispense Refill   acetaminophen (TYLENOL) 325 MG tablet Take 2 tablets (650 mg total) by mouth every 6 (six) hours.     amLODipine (NORVASC) 2.5 MG tablet Take 1 tablet (2.5 mg total) by mouth at bedtime. 90 tablet 0   atorvastatin (LIPITOR) 40 MG  tablet Take 1 tablet (40 mg total) by mouth daily. 90 tablet 1   B-D UF III MINI PEN NEEDLES 31G X 5 MM MISC USE AS DIRECTED BY DISCARD REMAINDER TO INJECT INSULIN 100 each 3   blood glucose meter kit and supplies Dispense based on patient and insurance preference. Use up to four times daily as directed. (FOR ICD-10 E10.9, E11.9). 1 each 0   calcium-vitamin D (OSCAL WITH D) 500-200 MG-UNIT tablet Take 1 tablet by mouth daily. 600mg  calcium 800 units Vitamin D     cholecalciferol (VITAMIN D) 1000 UNITS tablet Take by mouth.     ketoconazole (NIZORAL) 2 % cream Apply topically 2 (two) times daily. 60 g 1   Lancets (ONETOUCH DELICA PLUS LANCET30G) MISC USE TO TEST UP TO FOUR TIMES A DAY AS DIRECTED     levothyroxine (SYNTHROID) 112 MCG tablet Take 1 tablet (112 mcg total) by mouth daily. And half on Sundays 90 tablet 1   lisinopril (ZESTRIL) 40 MG tablet Take 1 tablet (40 mg total) by mouth at bedtime. 90 tablet 1   ONETOUCH ULTRA test strip USE TO TEST UP TO FOUR TIMES A DAY 300 strip 2   pregabalin (LYRICA) 75 MG capsule Take 1 capsule (75 mg total) by mouth 3 (three) times daily. 90 capsule 0   sertraline (ZOLOFT) 50 MG tablet TAKE 1 TABLET(50 MG) BY MOUTH DAILY 90 tablet 0    temazepam (RESTORIL) 15 MG capsule Take 1 capsule (15 mg total) by mouth at bedtime as needed for sleep. 30 capsule 2   tiZANidine (ZANAFLEX) 2 MG tablet Take 1 tablet (2 mg total) by mouth every 8 (eight) hours as needed for muscle spasms. (Patient not taking: Reported on 08/05/2021) 90 tablet 0   TRESIBA FLEXTOUCH 100 UNIT/ML FlexTouch Pen INJECT 10 UNITS INTO THE SKIN ONCE DAILY 15 mL 0   No facility-administered medications prior to visit.    No Known Allergies  ROS Review of Systems  Constitutional:  Negative for chills and fever.  Respiratory:  Negative for cough and shortness of breath.   Cardiovascular:  Negative for chest pain.  Gastrointestinal:  Positive for constipation and diarrhea. Negative for abdominal pain, blood in stool, nausea and vomiting.  Musculoskeletal:  Positive for arthralgias.     Objective:    Physical Exam Constitutional:      Appearance: Normal appearance.  HENT:     Head: Normocephalic and atraumatic.  Eyes:     Conjunctiva/sclera: Conjunctivae normal.  Cardiovascular:     Rate and Rhythm: Normal rate and regular rhythm.  Pulmonary:     Effort: Pulmonary effort is normal.     Breath sounds: Normal breath sounds.  Musculoskeletal:     Right lower leg: No edema.     Left lower leg: No edema.  Skin:    General: Skin is warm and dry.  Neurological:     General: No focal deficit present.     Mental Status: She is alert. Mental status is at baseline.  Psychiatric:        Mood and Affect: Mood normal.        Behavior: Behavior normal.    BP 128/80   Pulse 86   Temp 98.1 F (36.7 C)   Resp 16   Ht 5\' 1"  (1.549 m)   Wt 181 lb 8 oz (82.3 kg)   SpO2 99%   BMI 34.29 kg/m  Wt Readings from Last 3 Encounters:  11/06/21 181 lb 8 oz (82.3 kg)  05/11/21 175 lb 11.2 oz (79.7 kg)  01/06/21 165 lb (74.8 kg)     Health Maintenance Due  Topic Date Due   Zoster Vaccines- Shingrix (1 of 2) Never done   COVID-19 Vaccine (3 - Pfizer risk  series) 11/17/2019   OPHTHALMOLOGY EXAM  05/07/2021    There are no preventive care reminders to display for this patient.  Lab Results  Component Value Date   TSH 3.59 01/06/2021   Lab Results  Component Value Date   WBC 7.2 01/06/2021   HGB 14.9 01/06/2021   HCT 45.8 (H) 01/06/2021   MCV 84.3 01/06/2021   PLT 233 01/06/2021   Lab Results  Component Value Date   NA 142 01/06/2021   K 4.1 01/06/2021   CO2 28 01/06/2021   GLUCOSE 157 (H) 01/06/2021   BUN 26 (H) 01/06/2021   CREATININE 0.84 01/06/2021   BILITOT 0.9 01/06/2021   ALKPHOS 50 09/30/2016   AST 17 01/06/2021   ALT 18 01/06/2021   PROT 7.4 01/06/2021   ALBUMIN 4.4 09/30/2016   CALCIUM 9.8 01/06/2021   ANIONGAP 10 09/12/2020   Lab Results  Component Value Date   CHOL 195 01/06/2021   Lab Results  Component Value Date   HDL 55 01/06/2021   Lab Results  Component Value Date   LDLCALC 115 (H) 01/06/2021   Lab Results  Component Value Date   TRIG 133 01/06/2021   Lab Results  Component Value Date   CHOLHDL 3.5 01/06/2021   Lab Results  Component Value Date   HGBA1C 7.0 (A) 05/11/2021      Assessment & Plan:   Problem List Items Addressed This Visit       Cardiovascular and Mediastinum   Benign hypertension    Stable, continue current medications.       Relevant Medications   amLODipine (NORVASC) 2.5 MG tablet   lisinopril (ZESTRIL) 40 MG tablet     Endocrine   Controlled type 2 diabetes mellitus with microalbuminuria (HCC) - Primary    Check A1c today, insulin and diabetic supplies refilled today.      Relevant Medications   insulin degludec (TRESIBA FLEXTOUCH) 100 UNIT/ML FlexTouch Pen   glucose blood (ONETOUCH ULTRA) test strip   Lancets (ONETOUCH DELICA PLUS LANCET30G) MISC   Insulin Pen Needle (B-D UF III MINI PEN NEEDLES) 31G X 5 MM MISC   lisinopril (ZESTRIL) 40 MG tablet   Other Relevant Orders   HgB A1c   Adult hypothyroidism    Stable, recheck TSH at follow up.  Continue Levothyroxine.      Relevant Medications   levothyroxine (SYNTHROID) 112 MCG tablet   Dyslipidemia associated with type 2 diabetes mellitus (HCC)    Stable, continue current medications.       Relevant Medications   insulin degludec (TRESIBA FLEXTOUCH) 100 UNIT/ML FlexTouch Pen   lisinopril (ZESTRIL) 40 MG tablet     Nervous and Auditory   Chronic bilateral low back pain with right-sided sciatica    Following with Ortho for injections in knees, trial Celebrex for pain. No history of GI bleeds, not on anti-coagulation. Discussed risks of medication, patient voices understanding. Recheck at follow up.       Relevant Medications   celecoxib (CELEBREX) 100 MG capsule     Musculoskeletal and Integument   Primary osteoarthritis of both knees    Injections from Ortho.       Relevant Medications   celecoxib (CELEBREX) 100 MG capsule     Other  Major depression (HCC)    Having some anxiety but overall doing well on Zoloft 50. Continue current dose.      History of breast cancer    No longer following with Oncology.        Meds ordered this encounter  Medications   celecoxib (CELEBREX) 100 MG capsule    Sig: Take 1 capsule (100 mg total) by mouth 2 (two) times daily.    Dispense:  60 capsule    Refill:  1   amLODipine (NORVASC) 2.5 MG tablet    Sig: Take 1 tablet (2.5 mg total) by mouth at bedtime.    Dispense:  90 tablet    Refill:  0   insulin degludec (TRESIBA FLEXTOUCH) 100 UNIT/ML FlexTouch Pen    Sig: INJECT 10 UNITS INTO THE SKIN ONCE DAILY    Dispense:  15 mL    Refill:  0   glucose blood (ONETOUCH ULTRA) test strip    Sig: USE TO TEST UP TO FOUR TIMES A DAY    Dispense:  300 strip    Refill:  2   Lancets (ONETOUCH DELICA PLUS LANCET30G) MISC    Sig: USE TO TEST UP TO FOUR TIMES A DAY AS DIRECTED    Dispense:  100 each    Refill:  1   Insulin Pen Needle (B-D UF III MINI PEN NEEDLES) 31G X 5 MM MISC    Sig: USE AS DIRECTED BY DISCARD REMAINDER TO  INJECT INSULIN    Dispense:  100 each    Refill:  3   lisinopril (ZESTRIL) 40 MG tablet    Sig: Take 1 tablet (40 mg total) by mouth at bedtime.    Dispense:  90 tablet    Refill:  1   levothyroxine (SYNTHROID) 112 MCG tablet    Sig: Take 1 tablet (112 mcg total) by mouth daily. And half on Sundays    Dispense:  90 tablet    Refill:  1    Follow-up: Return in about 2 months (around 01/06/2022).    Margarita Mail, DO

## 2021-11-06 NOTE — Assessment & Plan Note (Signed)
Injections from Ortho.  ?

## 2021-11-06 NOTE — Assessment & Plan Note (Signed)
Stable, recheck TSH at follow up. Continue Levothyroxine. ?

## 2021-11-07 LAB — HEMOGLOBIN A1C
Hgb A1c MFr Bld: 7.6 % of total Hgb — ABNORMAL HIGH (ref <5.7)
Mean Plasma Glucose: 171 mg/dL
eAG (mmol/L): 9.5 mmol/L

## 2021-11-14 ENCOUNTER — Other Ambulatory Visit: Payer: Self-pay | Admitting: Family Medicine

## 2021-11-14 DIAGNOSIS — E1169 Type 2 diabetes mellitus with other specified complication: Secondary | ICD-10-CM

## 2021-12-02 ENCOUNTER — Ambulatory Visit: Payer: PPO | Admitting: Internal Medicine

## 2021-12-05 ENCOUNTER — Encounter (HOSPITAL_BASED_OUTPATIENT_CLINIC_OR_DEPARTMENT_OTHER): Payer: Self-pay | Admitting: *Deleted

## 2021-12-05 ENCOUNTER — Other Ambulatory Visit: Payer: Self-pay

## 2021-12-05 ENCOUNTER — Emergency Department (HOSPITAL_BASED_OUTPATIENT_CLINIC_OR_DEPARTMENT_OTHER)
Admission: EM | Admit: 2021-12-05 | Discharge: 2021-12-05 | Disposition: A | Discharge status: Home / Self Care | Payer: PPO | Attending: Student | Admitting: Student

## 2021-12-05 DIAGNOSIS — Z853 Personal history of malignant neoplasm of breast: Secondary | ICD-10-CM | POA: Diagnosis not present

## 2021-12-05 DIAGNOSIS — E119 Type 2 diabetes mellitus without complications: Secondary | ICD-10-CM | POA: Diagnosis not present

## 2021-12-05 DIAGNOSIS — S80862A Insect bite (nonvenomous), left lower leg, initial encounter: Secondary | ICD-10-CM | POA: Insufficient documentation

## 2021-12-05 DIAGNOSIS — I10 Essential (primary) hypertension: Secondary | ICD-10-CM | POA: Diagnosis not present

## 2021-12-05 DIAGNOSIS — Z96622 Presence of left artificial elbow joint: Secondary | ICD-10-CM | POA: Insufficient documentation

## 2021-12-05 DIAGNOSIS — W57XXXA Bitten or stung by nonvenomous insect and other nonvenomous arthropods, initial encounter: Secondary | ICD-10-CM | POA: Diagnosis not present

## 2021-12-05 DIAGNOSIS — Z79899 Other long term (current) drug therapy: Secondary | ICD-10-CM | POA: Insufficient documentation

## 2021-12-05 DIAGNOSIS — S8992XA Unspecified injury of left lower leg, initial encounter: Secondary | ICD-10-CM | POA: Diagnosis present

## 2021-12-05 DIAGNOSIS — Z794 Long term (current) use of insulin: Secondary | ICD-10-CM | POA: Diagnosis not present

## 2021-12-05 DIAGNOSIS — E039 Hypothyroidism, unspecified: Secondary | ICD-10-CM | POA: Insufficient documentation

## 2021-12-05 MED ORDER — DOXYCYCLINE HYCLATE 100 MG PO CAPS: 100.0000 mg | ORAL_CAPSULE | Freq: Two times a day (BID) | ORAL | 0 refills | Status: DC

## 2021-12-05 MED ORDER — ONDANSETRON 4 MG PO TBDP: 4.0000 mg | ORAL_TABLET | Freq: Three times a day (TID) | ORAL | 0 refills | Status: DC | PRN

## 2021-12-05 NOTE — ED Provider Notes (Signed)
?[facility] EMERGENCY DEPT ?Provider Note ? ?CSN: [id number] ?Arrival date & time: 12/05/21 1807 ? ?Chief Complaint(s) ?Tick Removal and Leg Swelling ? ?HPI ?Lindsey [name] is a 81 y.o. female who presents emergency department for evaluation of a tick bite with leg swelling.  Patient states that she was bit by a tick approximately 1 week ago on the anterior left shin.  The patient found the tick actively engorged with a white spot on the tick.  The tick was successfully removed by the patient but she has noticed a worsening wound with redness and swelling around the area of the tick bite.  Denies any additional symptoms including chest pain, shortness of breath, abdominal pain, nausea, vomiting, chills, fever or any other systemic symptoms.  After discussion with the patient, it appears the patient was bit by a [place] tick. ? ?HPI ? ?Past Medical History ?Past Medical History:  ?Diagnosis Date  ? Anxiety   ? Arthritis   ? Breast cancer ([place]) 1983  ? right breast cancer  ? Breast cancer in female [facility]) 11-04-15  ? Left Breast INVASIVE MAMMARY CARCINOMA  T1c, 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  ? Bursitis   ? R knee  ? Closed fracture of left ulna and radius   ? Complication of anesthesia   ? Depression   ? Diabetes mellitus type 2, controlled, without complications ([place]) 01/24/6802  ? Diabetes mellitus without complication ([place])   ? GERD (gastroesophageal reflux disease)   ? TUMS  ? Hyperlipidemia   ? Hypertension   ? Hypothyroidism   ? Osteoporosis   ? 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 PONV (postoperative nausea and vomiting)   ? Thyroid disease   ? ?Patient Active Problem List  ? Diagnosis Date Noted  ? Primary osteoarthritis of both knees 01/06/2021  ? Low serum vitamin B12 01/06/2021  ? Chronic bilateral low back pain with right-sided sciatica 01/06/2021  ? Dyslipidemia associated with type 2 diabetes mellitus ([place]) 01/06/2021  ? Mild protein-calorie malnutrition ([place]) 01/06/2021  ? Chronic pain  syndrome 01/06/2021  ? Diarrhea 10/28/2020  ? Incontinence of feces 10/28/2020  ? History of breast cancer 06/28/2018  ? Bilateral lower extremity edema 01/16/2016  ? Breast cancer, left breast ([place]) 11/05/2015  ? 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 Controlled type 2 diabetes mellitus with microalbuminuria ([place]) 04/25/2015  ? Hyperlipidemia 04/25/2015  ? Benign hypertension 04/25/2015  ? Adult hypothyroidism 04/25/2015  ? At risk for falling 04/25/2015  ? Major depression ([place]) 04/25/2015  ? Post menopausal syndrome 04/25/2015  ? ?Home Medication(s) ?Prior to Admission medications   ?Medication Sig Start Date End Date Taking? Authorizing Provider  ?acetaminophen (TYLENOL) 325 MG tablet Take 2 tablets (650 mg total) by mouth every 6 (six) hours. 12/11/18   Milly Jakob, MD  ?amLODipine (NORVASC) 2.5 MG tablet Take 1 tablet (2.5 mg total) by mouth at bedtime. 11/06/21   Teodora Medici, DO  ?atorvastatin (LIPITOR) 40 MG tablet TAKE 1 TABLET(40 MG) BY MOUTH DAILY 11/15/21   [name], MD  ?blood glucose meter kit and supplies Dispense based on patient and insurance preference. Use up to four times daily as directed. (FOR ICD-10 E10.9, E11.9). 03/31/20   [name], MD  ?calcium-vitamin D (OSCAL WITH D) 500-200 MG-UNIT tablet Take 1 tablet by mouth daily. 637m calcium 800 units Vitamin D    [provider]  ?celecoxib (CELEBREX) 100 MG capsule Take 1 capsule (100 mg total) by mouth 2 (two) times daily. 11/06/21   ATeodora Medici  DO  ?cholecalciferol (VITAMIN D) 1000 UNITS tablet Take by mouth.    [provider]  ?glucose blood (ONETOUCH ULTRA) test strip USE TO TEST UP TO FOUR TIMES A DAY 11/06/21   Teodora Medici, DO  ?insulin degludec (TRESIBA FLEXTOUCH) 100 UNIT/ML FlexTouch Pen INJECT 10 UNITS INTO THE SKIN ONCE DAILY 11/06/21   Teodora Medici, DO  ?Insulin Pen Needle (B-D UF III MINI PEN NEEDLES) 31G X 5 MM MISC USE AS DIRECTED BY DISCARD REMAINDER TO INJECT INSULIN  11/06/21   Teodora Medici, DO  ?ketoconazole (NIZORAL) 2 % cream Apply topically 2 (two) times daily. 08/12/20   [name], MD  ?Lancets [facility] DELICA PLUS [id number]) MISC USE TO TEST UP TO FOUR TIMES A DAY AS DIRECTED 11/06/21   Teodora Medici, DO  ?levothyroxine (SYNTHROID) 112 MCG tablet Take 1 tablet (112 mcg total) by mouth daily. And half on Sundays 11/06/21   Teodora Medici, DO  ?lisinopril (ZESTRIL) 40 MG tablet Take 1 tablet (40 mg total) by mouth at bedtime. 11/06/21   Teodora Medici, DO  ?sertraline (ZOLOFT) 50 MG tablet TAKE 1 TABLET(50 MG) BY MOUTH DAILY 11/02/21   [name], MD  ?temazepam (RESTORIL) 15 MG capsule Take 1 capsule (15 mg total) by mouth at bedtime as needed for sleep. 05/11/21   [name], MD  ?tiZANidine (ZANAFLEX) 2 MG tablet Take 1 tablet (2 mg total) by mouth every 8 (eight) hours as needed for muscle spasms. 04/06/21   [name], MD  ?                                                                                                                                  ?Past Surgical History ?Past Surgical History:  ?Procedure Laterality Date  ? BREAST BIOPSY Left 11-04-15  ? BREAST BIOPSY Left 10-10-07  ? CATARACT EXTRACTION Bilateral 1990  ? CHOLECYSTECTOMY    ? IR KYPHO EA ADDL LEVEL THORACIC OR LUMBAR  12/25/2020  ? IR KYPHO LUMBAR INC FX REDUCE BONE BX UNI/BIL CANNULATION INC/IMAGING  12/25/2020  ? IR RADIOLOGIST EVAL & MGMT  12/17/2020  ? IR VERTEBROPLASTY LUMBAR BX INC UNI/BIL INC/INJECT/IMAGING  12/25/2020  ? MASTECTOMY Right 1983  ? Dr Myrle Sheng  ? MASTECTOMY Right 1983  ? MASTECTOMY W/ SENTINEL NODE BIOPSY Left 12/12/2015  ? Procedure: MASTECTOMY WITH SENTINEL LYMPH NODE BIOPSY;  Surgeon: Christene Lye, MD;  Location: [facility] ORS;  Service: General;  Laterality: Left;  ? ORIF ULNAR FRACTURE Left 12/11/2018  ? Procedure: OPEN REDUCTION INTERNAL FIXATION (ORIF) LEFT PROXIMAL ULNA FRACTURE AND  RADIAL HEAD REPLACEMENT;  Surgeon: Milly Jakob, MD;   Location: [place];  Service: Orthopedics;  Laterality: Left;  ? RADIAL HEAD ARTHROPLASTY Left 12/11/2018  ? Procedure: OPEN REDUCTION INTERNAL FIXATION (ORIF) LEFT PROXIMAL ULNA FRACTURE AND  RADIAL HEAD REPLACEMENT;  Surgeon: Milly Jakob, MD;  Location: [facility];  Service: Orthopedics;  Laterality: Left;  ? ?  Family History ?Family History  ?Problem Relation Age of Onset  ? Kidney disease Mother   ? Heart disease Father   ? Stroke Sister   ? Cancer Sister   ?     breast  ? Breast cancer Sister   ? Stroke Brother   ? Diabetes Brother   ? Hypertension Brother   ? Cancer Daughter 29  ?     DCIS/lumpectomy/genetic negative  ? Breast cancer Daughter   ? ? ?Social History ?Social History  ? ?Tobacco Use  ? Smoking status: Never  ? Smokeless tobacco: Never  ? Tobacco comments:  ?  Non-smoker. Smoking cessation materials contraindicated  ?Vaping Use  ? Vaping Use: Never used  ?Substance Use Topics  ? Alcohol use: No  ?  Alcohol/week: 0.0 standard drinks  ? Drug use: No  ? ?Allergies ?Patient has no known allergies. ? ?Review of Systems ?Review of Systems  ?Skin:  Positive for rash and wound.  ? ?Physical Exam ?Vital Signs  ?I have reviewed the triage vital signs ?BP (!) 180/97 (BP Location: Left Arm)   Pulse 68   Temp 98.7 ?F (37.1 ?C)   Resp 16   SpO2 94%  ? ?Physical Exam ?Vitals and nursing note reviewed.  ?Constitutional:   ?   General: She is not in acute distress. ?   Appearance: She is well-developed.  ?HENT:  ?   Head: Normocephalic and atraumatic.  ?Eyes:  ?   Conjunctiva/sclera: Conjunctivae normal.  ?Cardiovascular:  ?   Rate and Rhythm: Normal rate and regular rhythm.  ?   Heart sounds: No murmur heard. ?Pulmonary:  ?   Effort: Pulmonary effort is normal. No respiratory distress.  ?   Breath sounds: Normal breath sounds.  ?Abdominal:  ?   Palpations: Abdomen is soft.  ?   Tenderness: There is no abdominal tenderness.  ?Musculoskeletal:     ?   General: No swelling.  ?    Cervical back: Neck supple.  ?Skin: ?   General: Skin is warm and dry.  ?   Capillary Refill: Capillary refill takes less than 2 seconds.  ?   Findings: Lesion (3 cm area of erythema, tender to the touch s

## 2021-12-05 NOTE — ED Triage Notes (Addendum)
Patient reports removing a tick from left shin last Friday or Saturday. Area that tick was removed is noted to have black circle, with redness, and bruising. Area is warm to touch. Distal to the bite patient does have redness, daughter-in law states this redness is more than usual. When compared to right leg redness is greater on left. No fevers.  ?

## 2021-12-10 ENCOUNTER — Ambulatory Visit (INDEPENDENT_AMBULATORY_CARE_PROVIDER_SITE_OTHER): Payer: PPO | Admitting: Internal Medicine

## 2021-12-10 ENCOUNTER — Encounter: Payer: Self-pay | Admitting: Internal Medicine

## 2021-12-10 VITALS — BP 140/82 | HR 82 | Temp 98.2°F | Resp 16 | Ht 61.0 in | Wt 180.9 lb

## 2021-12-10 DIAGNOSIS — W57XXXD Bitten or stung by nonvenomous insect and other nonvenomous arthropods, subsequent encounter: Secondary | ICD-10-CM

## 2021-12-10 DIAGNOSIS — S80862D Insect bite (nonvenomous), left lower leg, subsequent encounter: Secondary | ICD-10-CM | POA: Diagnosis not present

## 2021-12-10 DIAGNOSIS — I10 Essential (primary) hypertension: Secondary | ICD-10-CM

## 2021-12-10 DIAGNOSIS — L03116 Cellulitis of left lower limb: Secondary | ICD-10-CM

## 2021-12-10 NOTE — Progress Notes (Signed)
? ?Acute Office Visit ? ?Subjective:  ? ?  ?Patient ID: Lindsey [name], female    DOB: 1941/03/28, 81 y.o.   MRN: [id number] ? ?Chief Complaint  ?Patient presents with  ? Follow-up  ?  From urgent care tick bite, was given doxy  ? ? ?HPI ?Patient is in today for tick bite and ER FU. She was seen on 12/05/21 after finding a tick on her left anterior shin. She was able to remove the tick but it had been fully engorged when it was removed. Uncertain how long it was on for. Appeared to be a [employer] tick. After removing the tick, she did have a wound with redness and swelling. She was treated with Doxycycline 100 mg BID for 14 days. Today she states that she is doing well with the antibiotic and reports no side effects or missed doses. The erythema and swelling has greatly reduced. She still has a small area of erythema with small wound where the initial bite was. Has occasional clear or bloody drainage from the site. Denies fevers, chills, chest pain, palpitations, shortness of breath, abdominal pain, nausea/vomiting, diarrhea, new joint pains or other skin changes.  ? ?Hypertension: ?-Medications: Amlodipine 2.5 mg, Lisinopril 40 mg ?-Patient is compliant with above medications and reports no side effects. ?-Denies any SOB, CP, vision changes, LE edema or symptoms of hypotension ? ?Review of Systems  ?Constitutional:  Negative for chills, fever and malaise/fatigue.  ?Respiratory:  Negative for shortness of breath.   ?Cardiovascular:  Negative for chest pain and palpitations.  ?Gastrointestinal:  Negative for abdominal pain, diarrhea, nausea and vomiting.  ?Musculoskeletal:  Negative for joint pain.  ?Skin:  Positive for rash.  ? ? ?   ?Objective:  ?  ?BP 140/82   Pulse 82   Temp 98.2 ?F (36.8 ?C)   Resp 16   Ht '5\' 1"'$  (1.549 m)   Wt 180 lb 14.4 oz (82.1 kg)   SpO2 97%   BMI 34.18 kg/m?  ?BP Readings from Last 3 Encounters:  ?12/10/21 140/82  ?12/05/21 (!) 181/86  ?11/06/21 128/80  ? ?Wt Readings from Last 3  Encounters:  ?12/10/21 180 lb 14.4 oz (82.1 kg)  ?11/06/21 181 lb 8 oz (82.3 kg)  ?05/11/21 175 lb 11.2 oz (79.7 kg)  ? ?  ? ?Physical Exam ?Constitutional:   ?   Appearance: Normal appearance.  ?HENT:  ?   Head: Normocephalic and atraumatic.  ?Eyes:  ?   Conjunctiva/sclera: Conjunctivae normal.  ?Cardiovascular:  ?   Rate and Rhythm: Normal rate and regular rhythm.  ?Pulmonary:  ?   Effort: Pulmonary effort is normal.  ?   Breath sounds: Normal breath sounds.  ?Musculoskeletal:  ?   Right lower leg: No edema.  ?   Left lower leg: No edema.  ?Skin: ?   General: Skin is warm and dry.  ?   Comments: Area of erythema 1x1 cm with small wound in the center that is healing with small amount of clear fluid expressed. No crepitus or purulent drainage.  ?Neurological:  ?   General: No focal deficit present.  ?   Mental Status: She is alert. Mental status is at baseline.  ?Psychiatric:     ?   Mood and Affect: Mood normal.     ?   Behavior: Behavior normal.  ? ? ?  ?Assessment & Plan:  ? ?1. Tick bite of left lower leg, subsequent encounter/Cellulitis of left lower extremity: No concerning symptoms, thinking this  is most likely a cellulitis from tick bite. Continue full course of Doxycycline. Follow up in 2 weeks once medication course complete for recheck. Follow up sooner if symptoms worsen.  ? ?2. Benign hypertension: Blood pressure in ER elevated but much better today, continue Amlodipine 2.5 and Lisinopril 40 mg. Recheck at follow up.  ? ? ?Return in about 2 weeks (around 12/24/2021). ? ?Teodora Medici, DO ? ? ?

## 2021-12-10 NOTE — Patient Instructions (Addendum)
It was great seeing you today! ? ?Plan discussed at today's visit: ?-Continue Doxycycline for the entire 14 days ?-Continue to monitor for increased redness, swelling or if the drainage looks like pus or if you develop fevers ? ?Follow up in: 2 weeks  ? ?Take care and let us know if you have any questions or concerns prior to your next visit. ? ?Dr. Rosana Berger ? ?

## 2021-12-24 ENCOUNTER — Ambulatory Visit (INDEPENDENT_AMBULATORY_CARE_PROVIDER_SITE_OTHER): Payer: PPO | Admitting: Internal Medicine

## 2021-12-24 ENCOUNTER — Encounter: Payer: Self-pay | Admitting: Internal Medicine

## 2021-12-24 ENCOUNTER — Ambulatory Visit (INDEPENDENT_AMBULATORY_CARE_PROVIDER_SITE_OTHER): Payer: PPO

## 2021-12-24 DIAGNOSIS — Z0000 Encounter for general adult medical examination without abnormal findings: Secondary | ICD-10-CM

## 2021-12-24 DIAGNOSIS — M170 Bilateral primary osteoarthritis of knee: Secondary | ICD-10-CM | POA: Diagnosis not present

## 2021-12-24 DIAGNOSIS — F339 Major depressive disorder, recurrent, unspecified: Secondary | ICD-10-CM | POA: Diagnosis not present

## 2021-12-24 DIAGNOSIS — W57XXXD Bitten or stung by nonvenomous insect and other nonvenomous arthropods, subsequent encounter: Secondary | ICD-10-CM | POA: Diagnosis not present

## 2021-12-24 DIAGNOSIS — S80862D Insect bite (nonvenomous), left lower leg, subsequent encounter: Secondary | ICD-10-CM | POA: Diagnosis not present

## 2021-12-24 MED ORDER — CELECOXIB 100 MG PO CAPS: 100.0000 mg | ORAL_CAPSULE | Freq: Two times a day (BID) | ORAL | 1 refills | Status: DC

## 2021-12-24 MED ORDER — HYDROCORTISONE 1 % EX CREA: 1.0000 "application " | TOPICAL_CREAM | Freq: Two times a day (BID) | CUTANEOUS | 0 refills | Status: AC

## 2021-12-24 MED ORDER — SERTRALINE HCL 50 MG PO TABS: ORAL_TABLET | ORAL | 0 refills | Status: DC

## 2021-12-24 NOTE — Progress Notes (Signed)
? ?Subjective:  ? Lindsey [name] is a 81 y.o. female who presents for Medicare Annual (Subsequent) preventive examination. ? ?Virtual Visit via Telephone Note ? ?I connected with  Lindsey [name] on 12/24/21 at  3:30 PM EDT by telephone and verified that I am speaking with the correct person using two identifiers. ? ?Location: ?Patient: home ?Provider: [place] ?Persons participating in the virtual visit: patient/Nurse Health Advisor ?  ?I discussed the limitations, risks, security and privacy concerns of performing an evaluation and management service by telephone and the availability of in person appointments. The patient expressed understanding and agreed to proceed. ? ?Interactive audio and video telecommunications were attempted between this nurse and patient, however failed, due to patient having technical difficulties OR patient did not have access to video capability.  We continued and completed visit with audio only. ? ?Some vital signs Car be absent or patient reported.  ? ?Lindsey Marker, LPN ? ? ?Review of Systems    ? ?Cardiac Risk Factors include: advanced age (>53mn, >>69women);diabetes mellitus;dyslipidemia;hypertension;sedentary lifestyle ? ?   ?Objective:  ?  ?Today's Vitals  ? 12/24/21 1558  ?PainSc: 8   ? ?There is no height or weight on file to calculate BMI. ? ? ?  12/24/2021  ?  4:02 PM 12/23/2020  ?  3:58 PM 09/12/2020  ?  5:12 PM 12/04/2019  ?  2:27 PM 12/08/2018  ?  1:56 PM 07/29/2017  ? 10:03 AM 04/29/2017  ?  9:52 AM  ?Advanced Directives  ?Does Patient Have a Medical Advance Directive? Yes Yes No No Yes Yes No  ?Type of AParamedicof [place]Living will [place]Port CostaLiving will   [place] GroveLiving will [place]Living will   ?Does patient want to make changes to medical advance directive?     No - Patient declined    ?Copy of [place] Chart? No - copy requested No - copy requested    No - copy requested    ?Would patient like information on creating a medical advance directive?   No - Patient declined No - Patient declined     ? ? ?Current Medications (verified) ?Outpatient Encounter Medications as of 12/24/2021  ?Medication Sig  ? amLODipine (NORVASC) 2.5 MG tablet Take 1 tablet (2.5 mg total) by mouth at bedtime.  ? atorvastatin (LIPITOR) 40 MG tablet TAKE 1 TABLET(40 MG) BY MOUTH DAILY  ? blood glucose meter kit and supplies Dispense based on patient and insurance preference. Use up to four times daily as directed. (FOR ICD-10 E10.9, E11.9).  ? celecoxib (CELEBREX) 100 MG capsule Take 1 capsule (100 mg total) by mouth 2 (two) times daily.  ? cholecalciferol (VITAMIN D) 1000 UNITS tablet Take by mouth.  ? glucose blood (ONETOUCH ULTRA) test strip USE TO TEST UP TO FOUR TIMES A DAY  ? hydrocortisone cream 1 % Apply 1 application. topically 2 (two) times daily.  ? insulin degludec (TRESIBA FLEXTOUCH) 100 UNIT/ML FlexTouch Pen INJECT 10 UNITS INTO THE SKIN ONCE DAILY  ? Insulin Pen Needle (B-D UF III MINI PEN NEEDLES) 31G X 5 MM MISC USE AS DIRECTED BY DISCARD REMAINDER TO INJECT INSULIN  ? ketoconazole (NIZORAL) 2 % cream Apply topically 2 (two) times daily.  ? Lancets (ONETOUCH DELICA PLUS [id number] MISC USE TO TEST UP TO FOUR TIMES A DAY AS DIRECTED  ? levothyroxine (SYNTHROID) 112 MCG tablet Take 1 tablet (112 mcg total) by mouth daily. And half  on Sundays  ? lisinopril (ZESTRIL) 40 MG tablet Take 1 tablet (40 mg total) by mouth at bedtime.  ? ondansetron (ZOFRAN-ODT) 4 MG disintegrating tablet Take 1 tablet (4 mg total) by mouth every 8 (eight) hours as needed for nausea or vomiting.  ? sertraline (ZOLOFT) 50 MG tablet TAKE 1 TABLET(50 MG) BY MOUTH DAILY  ? [DISCONTINUED] celecoxib (CELEBREX) 100 MG capsule Take 1 capsule (100 mg total) by mouth 2 (two) times daily.  ? [DISCONTINUED] doxycycline (VIBRAMYCIN) 100 MG capsule Take 1 capsule (100 mg total) by mouth 2 (two) times daily.  ? [DISCONTINUED] sertraline  (ZOLOFT) 50 MG tablet TAKE 1 TABLET(50 MG) BY MOUTH DAILY  ? ?No facility-administered encounter medications on file as of 12/24/2021.  ? ? ?Allergies (verified) ?Patient has no known allergies.  ? ?History: ?Past Medical History:  ?Diagnosis Date  ? Anxiety   ? Arthritis   ? Breast cancer ([place]) 1983  ? right breast cancer  ? Breast cancer in female [facility]) 11-04-15  ? Left Breast INVASIVE MAMMARY CARCINOMA  T1c, 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  ? Bursitis   ? R knee  ? Closed fracture of left ulna and radius   ? Complication of anesthesia   ? Depression   ? Diabetes mellitus type 2, controlled, without complications ([place]) 08/28/1094  ? Diabetes mellitus without complication ([place])   ? GERD (gastroesophageal reflux disease)   ? TUMS  ? Hyperlipidemia   ? Hypertension   ? Hypothyroidism   ? Osteoporosis   ? 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 PONV (postoperative nausea and vomiting)   ? Thyroid disease   ? ?Past Surgical History:  ?Procedure Laterality Date  ? BREAST BIOPSY Left 11-04-15  ? BREAST BIOPSY Left 10-10-07  ? CATARACT EXTRACTION Bilateral 1990  ? CHOLECYSTECTOMY    ? IR KYPHO EA ADDL LEVEL THORACIC OR LUMBAR  12/25/2020  ? IR KYPHO LUMBAR INC FX REDUCE BONE BX UNI/BIL CANNULATION INC/IMAGING  12/25/2020  ? IR RADIOLOGIST EVAL & MGMT  12/17/2020  ? IR VERTEBROPLASTY LUMBAR BX INC UNI/BIL INC/INJECT/IMAGING  12/25/2020  ? MASTECTOMY Right 1983  ? Dr Myrle Sheng  ? MASTECTOMY Right 1983  ? MASTECTOMY W/ SENTINEL NODE BIOPSY Left 12/12/2015  ? Procedure: MASTECTOMY WITH SENTINEL LYMPH NODE BIOPSY;  Surgeon: Christene Lye, MD;  Location: [facility] ORS;  Service: General;  Laterality: Left;  ? ORIF ULNAR FRACTURE Left 12/11/2018  ? Procedure: OPEN REDUCTION INTERNAL FIXATION (ORIF) LEFT PROXIMAL ULNA FRACTURE AND  RADIAL HEAD REPLACEMENT;  Surgeon: Milly Jakob, MD;  Location: [place];  Service: Orthopedics;  Laterality: Left;  ? RADIAL HEAD ARTHROPLASTY Left 12/11/2018  ? Procedure: OPEN REDUCTION  INTERNAL FIXATION (ORIF) LEFT PROXIMAL ULNA FRACTURE AND  RADIAL HEAD REPLACEMENT;  Surgeon: Milly Jakob, MD;  Location: [place];  Service: Orthopedics;  Laterality: Left;  ? ?Family History  ?Problem Relation Age of Onset  ? Kidney disease Mother   ? Heart disease Father   ? Stroke Sister   ? Cancer Sister   ?     breast  ? Breast cancer Sister   ? Stroke Brother   ? Diabetes Brother   ? Hypertension Brother   ? Cancer Daughter 52  ?     DCIS/lumpectomy/genetic negative  ? Breast cancer Daughter   ? ?Social History  ? ?Socioeconomic History  ? Marital status: Married  ?  Spouse name: Not on file  ? Number of children: 3  ? Years of education:  Not on file  ? Highest education level: Not on file  ?Occupational History  ? Occupation: Retired  ?Tobacco Use  ? Smoking status: Never  ? Smokeless tobacco: Never  ? Tobacco comments:  ?  Non-smoker. Smoking cessation materials contraindicated  ?Vaping Use  ? Vaping Use: Never used  ?Substance and Sexual Activity  ? Alcohol use: No  ?  Alcohol/week: 0.0 standard drinks  ? Drug use: No  ? Sexual activity: Never  ?Other Topics Concern  ? Not on file  ?Social History Narrative  ? Not on file  ? ?Social Determinants of Health  ? ?Financial Resource Strain: Low Risk   ? Difficulty of Paying Living Expenses: Not hard at all  ?Food Insecurity: No Food Insecurity  ? Worried About Charity fundraiser in the Last Year: Never true  ? Ran Out of Food in the Last Year: Never true  ?Transportation Needs: No Transportation Needs  ? Lack of Transportation (Medical): No  ? Lack of Transportation (Non-Medical): No  ?Physical Activity: Inactive  ? Days of Exercise per Week: 0 days  ? Minutes of Exercise per Session: 0 min  ?Stress: No Stress Concern Present  ? Feeling of Stress : Not at all  ?Social Connections: Moderately Isolated  ? Frequency of Communication with Friends and Family: More than three times a week  ? Frequency of Social Gatherings with Friends and Family:  Once a week  ? Attends Religious Services: Never  ? Active Member of Clubs or Organizations: No  ? Attends Archivist Meetings: Never  ? Marital Status: Married  ? ? ?Tobacco Counseling ?Counselin

## 2021-12-24 NOTE — Patient Instructions (Addendum)
It was great seeing you today! ? ?Plan discussed at today's visit: ?-Continue to monitor rash, if it becomes more red, seems to spread, more painful or starting to drain let me know right away ?-Use steroid cream that I sent to pharmacy to prevent it from itching ?-Other medications refilled  ? ?Follow up in: 3 months ? ?Take care and let us know if you have any questions or concerns prior to your next visit. ? ?Dr. Rosana Berger ? ?

## 2021-12-24 NOTE — Patient Instructions (Signed)
Lindsey [name] , ?Thank you for taking time to come for your Medicare Wellness Visit. I appreciate your ongoing commitment to your health goals. Please review the following plan we discussed and let me know if I can assist you in the future.  ? ?Screening recommendations/referrals: ?Colonoscopy: no longer required ?Mammogram: no longer required ?Bone Density: no longer required ?Recommended yearly ophthalmology/optometry visit for glaucoma screening and checkup ?Recommended yearly dental visit for hygiene and checkup ? ?Vaccinations: ?Influenza vaccine: done 05/11/21 ?Pneumococcal vaccine: done 07/29/17 ?Tdap vaccine: done 06/23/20 ?Shingles vaccine: Shingrix discussed. Please contact your pharmacy for coverage information.  ?Covid-19:done 09/29/19, 10/20/19 ? ?Advanced directives:Please bring a copy of your health care power of attorney and living will to the office at your convenience.  ? ?Conditions/risks identified: Recommend continuing fall prevention at home ? ?Next appointment: Follow up in one year for your annual wellness visit  ? ? ?Preventive Care 38 Years and Older, Female ?Preventive care refers to lifestyle choices and visits with your health care provider that can promote health and wellness. ?What does preventive care include? ?A yearly physical exam. This is also called an annual well check. ?Dental exams once or twice a year. ?Routine eye exams. Ask your health care provider how often you should have your eyes checked. ?Personal lifestyle choices, including: ?Daily care of your teeth and gums. ?Regular physical activity. ?Eating a healthy diet. ?Avoiding tobacco and drug use. ?Limiting alcohol use. ?Practicing safe sex. ?Taking low-dose aspirin every day. ?Taking vitamin and mineral supplements as recommended by your health care provider. ?What happens during an annual well check? ?The services and screenings done by your health care provider during your annual well check will depend on your age, overall  health, lifestyle risk factors, and family history of disease. ?Counseling  ?Your health care provider Revoir ask you questions about your: ?Alcohol use. ?Tobacco use. ?Drug use. ?Emotional well-being. ?Home and relationship well-being. ?Sexual activity. ?Eating habits. ?History of falls. ?Memory and ability to understand (cognition). ?Work and work Statistician. ?Reproductive health. ?Screening  ?You Azar have the following tests or measurements: ?Height, weight, and BMI. ?Blood pressure. ?Lipid and cholesterol levels. These Martorana be checked every 5 years, or more frequently if you are over 4 years old. ?Skin check. ?Lung cancer screening. You Ryant have this screening every year starting at age 65 if you have a 30-pack-year history of smoking and currently smoke or have quit within the past 15 years. ?Fecal occult blood test (FOBT) of the stool. You Setzler have this test every year starting at age 29. ?Flexible sigmoidoscopy or colonoscopy. You Schwarting have a sigmoidoscopy every 5 years or a colonoscopy every 10 years starting at age 17. ?Hepatitis C blood test. ?Hepatitis B blood test. ?Sexually transmitted disease (STD) testing. ?Diabetes screening. This is done by checking your blood sugar (glucose) after you have not eaten for a while (fasting). You Intriago have this done every 1-3 years. ?Bone density scan. This is done to screen for osteoporosis. You Willits have this done starting at age 30. ?Mammogram. This Pelle be done every 1-2 years. Talk to your health care provider about how often you should have regular mammograms. ?Talk with your health care provider about your test results, treatment options, and if necessary, the need for more tests. ?Vaccines  ?Your health care provider Sato recommend certain vaccines, such as: ?Influenza vaccine. This is recommended every year. ?Tetanus, diphtheria, and acellular pertussis (Tdap, Td) vaccine. You Nichols need a Td booster every 10 years. ?Zoster vaccine.  You Reede need this after age  32. ?Pneumococcal 13-valent conjugate (PCV13) vaccine. One dose is recommended after age 48. ?Pneumococcal polysaccharide (PPSV23) vaccine. One dose is recommended after age 14. ?Talk to your health care provider about which screenings and vaccines you need and how often you need them. ?This information is not intended to replace advice given to you by your health care provider. Make sure you discuss any questions you have with your health care provider. ?Document Released: 09/05/2015 Document Revised: 04/28/2016 Document Reviewed: 06/10/2015 ?Elsevier Interactive Patient Education ? 2017 [employer]. ? ?Fall Prevention in the Home ?Falls can cause injuries. They can happen to people of all ages. There are many things you can do to make your home safe and to help prevent falls. ?What can I do on the outside of my home? ?Regularly fix the edges of walkways and driveways and fix any cracks. ?Remove anything that might make you trip as you walk through a door, such as a raised step or threshold. ?Trim any bushes or trees on the path to your home. ?Use bright outdoor lighting. ?Clear any walking paths of anything that might make someone trip, such as rocks or tools. ?Regularly check to see if handrails are loose or broken. Make sure that both sides of any steps have handrails. ?Any raised decks and porches should have guardrails on the edges. ?Have any leaves, snow, or ice cleared regularly. ?Use sand or salt on walking paths during winter. ?Clean up any spills in your garage right away. This includes oil or grease spills. ?What can I do in the bathroom? ?Use night lights. ?Install grab bars by the toilet and in the tub and shower. Do not use towel bars as grab bars. ?Use non-skid mats or decals in the tub or shower. ?If you need to sit down in the shower, use a plastic, non-slip stool. ?Keep the floor dry. Clean up any water that spills on the floor as soon as it happens. ?Remove soap buildup in the tub or shower  regularly. ?Attach bath mats securely with double-sided non-slip rug tape. ?Do not have throw rugs and other things on the floor that can make you trip. ?What can I do in the bedroom? ?Use night lights. ?Make sure that you have a light by your bed that is easy to reach. ?Do not use any sheets or blankets that are too big for your bed. They should not hang down onto the floor. ?Have a firm chair that has side arms. You can use this for support while you get dressed. ?Do not have throw rugs and other things on the floor that can make you trip. ?What can I do in the kitchen? ?Clean up any spills right away. ?Avoid walking on wet floors. ?Keep items that you use a lot in easy-to-reach places. ?If you need to reach something above you, use a strong step stool that has a grab bar. ?Keep electrical cords out of the way. ?Do not use floor polish or wax that makes floors slippery. If you must use wax, use non-skid floor wax. ?Do not have throw rugs and other things on the floor that can make you trip. ?What can I do with my stairs? ?Do not leave any items on the stairs. ?Make sure that there are handrails on both sides of the stairs and use them. Fix handrails that are broken or loose. Make sure that handrails are as long as the stairways. ?Check any carpeting to make sure that it is  firmly attached to the stairs. Fix any carpet that is loose or worn. ?Avoid having throw rugs at the top or bottom of the stairs. If you do have throw rugs, attach them to the floor with carpet tape. ?Make sure that you have a light switch at the top of the stairs and the bottom of the stairs. If you do not have them, ask someone to add them for you. ?What else can I do to help prevent falls? ?Wear shoes that: ?Do not have high heels. ?Have rubber bottoms. ?Are comfortable and fit you well. ?Are closed at the toe. Do not wear sandals. ?If you use a stepladder: ?Make sure that it is fully opened. Do not climb a closed stepladder. ?Make sure that  both sides of the stepladder are locked into place. ?Ask someone to hold it for you, if possible. ?Clearly mark and make sure that you can see: ?Any grab bars or handrails. ?First and last steps. ?Where the edge of eac

## 2021-12-24 NOTE — Progress Notes (Signed)
Virtual Visit via Telephone Note ? ?I connected with Lindsey [name] on 12/24/21 at  3:00 PM EDT by telephone and verified that I am speaking with the correct person using two identifiers. ? ?Location: ?Patient: Home ?Provider: [facility] ?  ?I discussed the limitations, risks, security and privacy concerns of performing an evaluation and management service by telephone and the availability of in person appointments. I also discussed with the patient that there Burdin be a patient responsible charge related to this service. The patient expressed understanding and agreed to proceed. ? ? ?History of Present Illness: ? ?Lindsey [name] is a 81 year old female presenting via telephone for follow up on tick bite. Was seen in the ER on 12/05/21 after finding a tick on left anterior shin that had been engorged. She was treated with Doxycycline 100 mg BID x 14 days. At our last office visit, rash was improving greatly but she was still on the antibiotic. Today she states the rash is continuing to improve although it is still present. She finished the antibiotics a few days ago.  There is no drainage from the wound although it does itch occasionally.  She denies any new joint pains, chest pain, shortness of breath, palpitations.  She also denies abdominal pain, nausea, vomiting or changes in bowels.  Overall she is doing well.  She does need some refills of her medications.  She is currently on Celebrex 100 mg twice daily for osteoarthritis.  She states she is tolerating this medication well, no GI side effects, no blood or dark-colored stools.  She states that the medication moderately helps her pain but she still has some arthritis pain.  She also needs a refill of her Zoloft which she takes for MDD.  She is currently on Zoloft 50 mg.  She has been on this medication for a couple years and has been doing well.  She does feel like her symptoms are well controlled and she denies any side effects.  She does take this medication every  day. ? ? ?Observations/Objective: ? ?General: no acute distress ?Neuro: Answers all questions appropriately ? ?Assessment and Plan: ? ?1. Tick bite of left lower leg, subsequent encounter: Sounds like this is improving.  She did finish doxycycline 100 mg twice daily x14 days.  She will monitor and let me know if the rash becomes more painful, spreads or if she develops any new Lyme-like symptoms.  In the meantime we will treat with a hydrocortisone 1% cream to help with the itching. ? ?- hydrocortisone cream 1 %; Apply 1 application. topically 2 (two) times daily.  Dispense: 30 g; Refill: 0 ? ?2. Major depression, recurrent, chronic ([place]): Stable.  Refills sent of Zoloft 50 mg daily. ?- sertraline (ZOLOFT) 50 MG tablet; TAKE 1 TABLET(50 MG) BY MOUTH DAILY  Dispense: 90 tablet; Refill: 0 ? ?3. Primary osteoarthritis of both knees: Stable.  Refill Celebrex 100 mg twice daily, continue to monitor for GI symptoms. ? ?- celecoxib (CELEBREX) 100 MG capsule; Take 1 capsule (100 mg total) by mouth 2 (two) times daily.  Dispense: 180 capsule; Refill: 1 ? ?Follow Up Instructions: 3 months in person exam ? ?  ?I discussed the assessment and treatment plan with the patient. The patient was provided an opportunity to ask questions and all were answered. The patient agreed with the plan and demonstrated an understanding of the instructions. ?  ?The patient was advised to call back or seek an in-person evaluation if the symptoms worsen or if  the condition fails to improve as anticipated. ? ?I provided 11 minutes of non-face-to-face time during this encounter. ? ? ?Teodora Medici, DO ? ?

## 2021-12-25 ENCOUNTER — Telehealth: Payer: Self-pay

## 2021-12-25 NOTE — Telephone Encounter (Signed)
Copied from [place] [id number]. Topic: General - Other ?>> [place] 5, 2023  9:38 AM Tessa Lerner A wrote: ?Reason for CRM: The patient's daughter has received a missed call from the practice ? ?There were no open notes or encounters at the time of returned call ? ?The patient can be reached directly at home at [phone]  ? ?Please contact further if needed ?

## 2022-01-26 ENCOUNTER — Telehealth: Payer: Self-pay

## 2022-01-26 NOTE — Progress Notes (Signed)
    Chronic Care Management Pharmacy Assistant   Name: Lindsey [name]  MRN: [id number] DOB: Dec 07, 1940  Patient's daughter [name] (HIPAA compliant) called to be reminded of patient's telephone appointment with [name], CPP on 01/27/2022 @ 1500.  Per [name] please call patient directly at [phone].  Patient aware of appointment date, time, and type of appointment (either telephone or in person). Patient aware to have/bring all medications, supplements, blood pressure and/or blood sugar logs to visit.  Star Rating Drug: Atorvastatin 40 mg last filled on 11/16/2021 for a 90-Day supply with [facility]. Lisinopril 40 mg last filled on 11/06/2021 for a 90-Day supply with [facility].  Any gaps in medications fill history? None  Care Gaps: Zoster Vaccine COVID-19 Vaccine Booster 3 Diabetic Foot Exam  [name], CPA/CMA Clinical Pharmacist Assistant Phone: [phone]

## 2022-01-27 ENCOUNTER — Ambulatory Visit (INDEPENDENT_AMBULATORY_CARE_PROVIDER_SITE_OTHER): Payer: PPO

## 2022-01-27 DIAGNOSIS — R809 Proteinuria, unspecified: Secondary | ICD-10-CM

## 2022-01-27 DIAGNOSIS — I10 Essential (primary) hypertension: Secondary | ICD-10-CM

## 2022-01-27 NOTE — Progress Notes (Unsigned)
Chronic Care Management Pharmacy Note  01/27/2022 Name:  Lindsey [name] MRN:  [id number] DOB:  [birth year]  Summary: Patient presents today for follow-up. Blood sugars mostly well controlled. She is not monitoring her blood pressure at home.   Patient reports poorly controlled pain, she has not been taking her pregabalin due to needing a refill.   Recommendations/Changes made from today's visit: Continue current medications  Plan: CPP follow-up in 6 months   Subjective: Lindsey [name] is an 81 y.o. year old female who is a primary patient of Teodora Medici, DO.  The CCM team was consulted for assistance with disease management and care coordination needs.    Engaged with patient by telephone for follow up visit in response to provider referral for pharmacy case management and/or care coordination services.   Consent to Services:  The patient was given information about Chronic Care Management services, agreed to services, and gave verbal consent prior to initiation of services.  Please see initial visit note for detailed documentation.   Patient Care Team: Teodora Medici, DO as PCP - General (Internal Medicine) Melrose Nakayama, MD as Consulting Physician (Orthopedic Surgery) [name], MD as Attending Physician (Family Medicine) Germaine Pomfret, [facility] as Pharmacist (Pharmacist)  Recent office visits: 12/24/21: Patient presented to Dr. Rosana Berger for tick bite.   12/10/21: Patient presented to Dr. Rosana Berger for tick bite.  11/06/21: Patient presented to Dr. Rosana Berger for follow-up. A1c 7.6%.   Recent consult visits: None in previous 6 months  Hospital visits: None in previous 6 months   Objective:  Lab Results  Component Value Date   CREATININE 0.84 01/06/2021   BUN 26 (H) 01/06/2021   GFRNONAA 65 01/06/2021   GFRAA 76 01/06/2021   NA 142 01/06/2021   K 4.1 01/06/2021   CALCIUM 9.8 01/06/2021   CO2 28 01/06/2021   GLUCOSE 157 (H) 01/06/2021    Lab Results   Component Value Date/Time   HGBA1C 7.6 (H) 11/06/2021 04:10 PM   HGBA1C 7.0 (A) 05/11/2021 10:37 AM   HGBA1C 7.5 (A) 01/06/2021 10:52 AM   HGBA1C 11.8 (H) 12/19/2019 11:34 AM   MICROALBUR 8.0 01/06/2021 11:31 AM   MICROALBUR 0.7 12/26/2019 11:33 AM   MICROALBUR NEG 07/01/2016 10:10 AM    Last diabetic Eye exam:  Lab Results  Component Value Date/Time   HMDIABEYEEXA Retinopathy (A) 06/22/2021 12:00 AM    Last diabetic Foot exam: No results found for: HMDIABFOOTEX   Lab Results  Component Value Date   CHOL 195 01/06/2021   HDL 55 01/06/2021   LDLCALC 115 (H) 01/06/2021   TRIG 133 01/06/2021   CHOLHDL 3.5 01/06/2021       Latest Ref Rng & Units 01/06/2021   11:31 AM 12/19/2019   11:34 AM 11/27/2018   11:21 AM  Hepatic Function  Total Protein 6.1 - 8.1 g/dL 7.4   6.8   6.7    AST 10 - 35 U/L '17   16   16    ' ALT 6 - 29 U/L '18   17   15    ' Total Bilirubin 0.2 - 1.2 mg/dL 0.9   0.8   0.8      Lab Results  Component Value Date/Time   TSH 3.59 01/06/2021 11:31 AM   TSH 0.92 08/12/2020 10:41 AM       Latest Ref Rng & Units 01/06/2021   11:31 AM 12/19/2020   10:20 AM 09/12/2020    6:02 PM  CBC  WBC 3.8 -  10.8 Thousand/uL 7.2   4.6   2.9    Hemoglobin 11.7 - 15.5 g/dL 14.9   13.0   12.4    Hematocrit 35.0 - 45.0 % 45.8   40.7   39.4    Platelets 140 - 400 Thousand/uL 233   208   150      Lab Results  Component Value Date/Time   VD25OH 40 11/24/2017 10:57 AM   VD25OH 42 09/30/2016 10:07 AM    Clinical ASCVD: No  The ASCVD Risk score (Arnett DK, et al., 2019) failed to calculate for the following reasons:   The 2019 ASCVD risk score is only valid for ages 38 to 79       12/24/2021    4:01 PM 12/24/2021    3:33 PM 12/10/2021    3:02 PM  Depression screen PHQ 2/9  Decreased Interest 0 0 0  Down, Depressed, Hopeless 0 0 0  PHQ - 2 Score 0 0 0  Altered sleeping 0 0 0  Tired, decreased energy 0 0 0  Change in appetite 0 0 0  Feeling bad or failure about yourself  0 0 0   Trouble concentrating 0 0 0  Moving slowly or fidgety/restless 0 0 0  Suicidal thoughts 0 0 0  PHQ-9 Score 0 0 0  Difficult doing work/chores Not difficult at all Not difficult at all Not difficult at all    Social History   Tobacco Use  Smoking Status Never  Smokeless Tobacco Never  Tobacco Comments   Non-smoker. Smoking cessation materials contraindicated   BP Readings from Last 3 Encounters:  12/10/21 140/82  12/05/21 (!) 181/86  11/06/21 128/80   Pulse Readings from Last 3 Encounters:  12/10/21 82  12/05/21 68  11/06/21 86   Wt Readings from Last 3 Encounters:  12/10/21 180 lb 14.4 oz (82.1 kg)  11/06/21 181 lb 8 oz (82.3 kg)  05/11/21 175 lb 11.2 oz (79.7 kg)   BMI Readings from Last 3 Encounters:  12/10/21 34.18 kg/m  11/06/21 34.29 kg/m  05/11/21 33.20 kg/m   -Last DEXA Scan: 04/30/2020    T-Score femoral neck: -2.7  T-Score total hip: -2.6  T-Score lumbar spine: -4.6  Assessment/Interventions: Review of patient past medical history, allergies, medications, health status, including review of consultants reports, laboratory and other test data, was performed as part of comprehensive evaluation and provision of chronic care management services.   SDOH:  (Social Determinants of Health) assessments and interventions performed: Yes    SDOH Screenings   Alcohol Screen: Low Risk    Last Alcohol Screening Score (AUDIT): 0  Depression (PHQ2-9): Low Risk    PHQ-2 Score: 0  Financial Resource Strain: Low Risk    Difficulty of Paying Living Expenses: Not hard at all  Food Insecurity: No Food Insecurity   Worried About Charity fundraiser in the Last Year: Never true   Ran Out of Food in the Last Year: Never true  Housing: Low Risk    Last Housing Risk Score: 0  Physical Activity: Inactive   Days of Exercise per Week: 0 days   Minutes of Exercise per Session: 0 min  Social Connections: Moderately Isolated   Frequency of Communication with Friends and Family:  More than three times a week   Frequency of Social Gatherings with Friends and Family: Once a week   Attends Religious Services: Never   Marine scientist or Organizations: No   Attends Archivist Meetings: Never  Marital Status: Married  Stress: No Stress Concern Present   Feeling of Stress : Not at all  Tobacco Use: Low Risk    Smoking Tobacco Use: Never   Smokeless Tobacco Use: Never   Passive Exposure: Not on file  Transportation Needs: No Transportation Needs   Lack of Transportation (Medical): No   Lack of Transportation (Non-Medical): No    CCM Care Plan  No Known Allergies  Medications Reviewed Today     Reviewed by Teodora Medici, DO (Physician) on 12/24/21 at 4  Med List Status: <None>   Medication Order Taking? Sig Documenting Provider Last Dose Status Informant  amLODipine (NORVASC) 2.5 MG tablet [id number] Yes Take 1 tablet (2.5 mg total) by mouth at bedtime. Teodora Medici, DO Taking Active   atorvastatin (LIPITOR) 40 MG tablet [id number] Yes TAKE 1 TABLET(40 MG) BY MOUTH DAILY [name], MD Taking Active   blood glucose meter kit and supplies [id number] Yes Dispense based on patient and insurance preference. Use up to four times daily as directed. (FOR ICD-10 E10.9, E11.9). [name], MD Taking Active   celecoxib (CELEBREX) 100 MG capsule [id number]  Take 1 capsule (100 mg total) by mouth 2 (two) times daily. Teodora Medici, DO  Active   cholecalciferol (VITAMIN D) 1000 UNITS tablet [id number] Yes Take by mouth. [provider] Taking Active            Med Note Gloris Ham, [place] Miners D   Tue Dec 04, 2019  2:26 PM)    glucose blood [facility] ULTRA) test strip [id number] Yes USE TO TEST UP TO FOUR TIMES A Casimer Lanius, DO Taking Active   hydrocortisone cream 1 % [id number] Yes Apply 1 application. topically 2 (two) times daily. Teodora Medici, DO  Active   insulin degludec (TRESIBA FLEXTOUCH) 100 UNIT/ML FlexTouch Pen  [id number] Yes INJECT 10 UNITS INTO THE SKIN ONCE DAILY Teodora Medici, DO Taking Active   Insulin Pen Needle (B-D UF III MINI PEN NEEDLES) 31G X 5 MM MISC [id number] Yes USE AS DIRECTED BY DISCARD REMAINDER TO INJECT INSULIN Teodora Medici, DO Taking Active   ketoconazole (NIZORAL) 2 % cream [id number] Yes Apply topically 2 (two) times daily. [name], MD Taking Active   Lancets (ONETOUCH DELICA PLUS [id number]) [place] [id number] Yes USE TO TEST UP TO FOUR TIMES A DAY AS DIRECTED Teodora Medici, DO Taking Active   levothyroxine (SYNTHROID) 112 MCG tablet [id number] Yes Take 1 tablet (112 mcg total) by mouth daily. And half on Sundays Teodora Medici, DO Taking Active   lisinopril (ZESTRIL) 40 MG tablet [id number] Yes Take 1 tablet (40 mg total) by mouth at bedtime. Teodora Medici, DO Taking Active   ondansetron (ZOFRAN-ODT) 4 MG disintegrating tablet [id number] Yes Take 1 tablet (4 mg total) by mouth every 8 (eight) hours as needed for nausea or vomiting. Kommor, [place], MD Taking Active   sertraline (ZOLOFT) 50 MG tablet [id number]  TAKE 1 TABLET(50 MG) BY MOUTH DAILY Teodora Medici, DO  Active             Patient Active Problem List   Diagnosis Date Noted   Primary osteoarthritis of both knees 01/06/2021   Low serum vitamin B12 01/06/2021   Chronic bilateral low back pain with right-sided sciatica 01/06/2021   Dyslipidemia associated with type 2 diabetes mellitus ([place]) 01/06/2021   Mild protein-calorie malnutrition (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[place]) 04/25/2015   Hyperlipidemia 04/25/2015   Benign hypertension 04/25/2015   Adult hypothyroidism 04/25/2015    At risk for falling 04/25/2015   Major depression ([place]) 04/25/2015   Post menopausal syndrome 04/25/2015    Immunization History  Administered Date(s) Administered   Fluad Quad(high Dose 65+) 05/30/2019, 06/23/2020   Influenza, High Dose Seasonal PF 06/12/2014, 06/09/2015, 07/01/2016, 04/29/2017, 06/28/2018, 05/11/2021   Influenza,inj,Quad PF,6+ Mos 07/23/2013   PFIZER(Purple Top)SARS-COV-2 Vaccination 09/29/2019, 10/20/2019   Pneumococcal Conjugate-13 08/13/2014   Pneumococcal Polysaccharide-23 07/29/2017   Tdap 06/23/2020    Conditions to be addressed/monitored:  Hypertension, Hyperlipidemia, Diabetes, Hypothyroidism, Osteoporosis, and Chronic Pain  There are no care plans that you recently modified to display for this patient.      Medication Assistance: None required.  Patient affirms current coverage meets needs.  Compliance/Adherence/Medication fill history: Care Gaps: Shingrix  Covid Booster   Star-Rating Drugs: Atorvastatin 40 mg last filled on 03/18/2021 for 90 day supply at [facility]. Lisinopril 40 mg last filled on 03/18/2021 for 90 day supply at [facility].  Patient's preferred pharmacy is:  [facility] [place], [place] - [place] [address] [place] [address] Phone: [phone] Fax: [place], [place] [address] Phone: [phone] Fax: [phone]   Uses pill box? Yes Pt endorses 100% compliance  We discussed: Current pharmacy is preferred with insurance plan and patient is satisfied with pharmacy services Patient decided to: Continue current medication management strategy  Care Plan and Follow Up Patient Decision:  Patient agrees to Care Plan and Follow-up.  Plan: Telephone follow up appointment with care  management team member scheduled for:  01/27/2022 at 3:00 PM  Malva Limes, [facility] [phone]  Current Barriers:  Unable to achieve control of osteoporosis   Pharmacist Clinical Goal(s):  Patient will achieve control of osteoporosis as evidenced by prevention of bone fractures maintain control of diabetes as evidenced by A1c less than 8%  through collaboration with PharmD and provider.   Interventions: 1:1 collaboration with [name], MD regarding development and update of comprehensive plan of care as evidenced by provider attestation and co-signature Inter-disciplinary care team collaboration (see longitudinal plan of care) Comprehensive medication review performed; medication list updated in electronic medical record  Hypertension (BP goal <140/90) -Uncontrolled -Current treatment: Amlodipine 2.5 mg nightly Lisinopril 40 mg daily  -Medications previously tried: NA  -Current home readings: does not monitor at home.  -Denies hypotensive/hypertensive symptoms -Counseled to monitor BP at home weekly, document, and provide log at future appointments -Recommended to continue current medication  Hyperlipidemia: (LDL goal < 100) -Controlled -Current treatment: Atorvastatin 40 mg daily  -Medications previously tried: NA  -Recommended to continue current medication  Diabetes (A1c goal <8%) -Controlled -Current medications: Tresiba 10 units daily  -Medications previously tried: Metformin Januvia,Glipizide   -Daughter-in-law.  -Current home glucose readings fasting glucose:   Before Supper: 137, 147, 189. 89-251  -Denies hypoglycemic/hyperglycemic symptoms -Current dietary patterns: Rarely cooks at home. Does have access to Meals on Wheels. Snacks on pretzels or [employer].  -Recommended to continue current medication  Osteoporosis / Osteopenia (  Goal Prevent bone fractures) -Controlled -Patient  is a candidate for pharmacologic treatment due to T-Score < -2.5 in femoral neck, T-Score < -2.5 in total hip , and T-Score < -2.5 in lumbar spine -Current treatment  Calcium + D 600-800 daily  -Medications previously tried: Alendronate (ineffective), Prolia (cost)  -Recommended to continue current medication  Depression/Anxiety (Goal: Maintain stable mood) -Uncontrolled -Current treatment: Sertraline 50 mg daily  -Medications previously tried/failed: NA -PHQ9: 25 -Recommended cognitive behavioral therapy, patient will consider -Recommended to continue current medication  Hypothyroidism (Goal: Maintain stable thyroid function) -Controlled -Current treatment  Levothyroxine 112 mcg 1/2 tablet on Sunday, 1 tablet all other days  -Medications previously tried: NA  -Recommended to continue current medication  Patient Goals/Self-Care Activities Patient will:  - check glucose daily before breakfast, document, and provide at future appointments check blood pressure weekly, document, and provide at future appointments  Follow Up Plan: Telephone follow up appointment with care management team member scheduled for:  01/27/2022 at 3:00 PM

## 2022-02-10 ENCOUNTER — Other Ambulatory Visit: Payer: Self-pay | Admitting: Internal Medicine

## 2022-02-10 DIAGNOSIS — I10 Essential (primary) hypertension: Secondary | ICD-10-CM

## 2022-02-11 NOTE — Telephone Encounter (Signed)
Requested Prescriptions  Pending Prescriptions Disp Refills  . amLODipine (NORVASC) 2.5 MG tablet [Pharmacy Med Name: AMLODIPINE BESYLATE 2.'5MG'$  TABLETS] 90 tablet 0    Sig: TAKE 1 TABLET(2.5 MG) BY MOUTH AT BEDTIME     Cardiovascular: Calcium Channel Blockers 2 Failed - 02/10/2022  2:58 PM      Failed - Last BP in normal range    BP Readings from Last 1 Encounters:  12/10/21 140/82         Passed - Last Heart Rate in normal range    Pulse Readings from Last 1 Encounters:  12/10/21 82         Passed - Valid encounter within last 6 months    Recent Outpatient Visits          1 month ago Tick bite of left lower leg, subsequent encounter   [place], DO   2 months ago Tick bite of left lower leg, subsequent encounter   [facility] Teodora Medici, DO   3 months ago Controlled type 2 diabetes mellitus with microalbuminuria, with long-term current use of insulin [facility])   [place], DO   9 months ago Dyslipidemia associated with type 2 diabetes mellitus [facility])   [facility] [name], MD   11 months ago Moderate episode of recurrent major depressive disorder [facility])   [facility] [name], MD

## 2022-02-19 ENCOUNTER — Encounter: Payer: Self-pay | Admitting: Family Medicine

## 2022-02-19 ENCOUNTER — Ambulatory Visit (INDEPENDENT_AMBULATORY_CARE_PROVIDER_SITE_OTHER): Payer: PPO | Admitting: Family Medicine

## 2022-02-19 VITALS — BP 128/78 | HR 83 | Resp 16 | Ht 61.0 in | Wt 177.0 lb

## 2022-02-19 DIAGNOSIS — E039 Hypothyroidism, unspecified: Secondary | ICD-10-CM

## 2022-02-19 DIAGNOSIS — M8580 Other specified disorders of bone density and structure, unspecified site: Secondary | ICD-10-CM

## 2022-02-19 DIAGNOSIS — E1159 Type 2 diabetes mellitus with other circulatory complications: Secondary | ICD-10-CM | POA: Diagnosis not present

## 2022-02-19 DIAGNOSIS — S80862D Insect bite (nonvenomous), left lower leg, subsequent encounter: Secondary | ICD-10-CM | POA: Diagnosis not present

## 2022-02-19 DIAGNOSIS — F32A Depression, unspecified: Secondary | ICD-10-CM

## 2022-02-19 DIAGNOSIS — E1169 Type 2 diabetes mellitus with other specified complication: Secondary | ICD-10-CM

## 2022-02-19 DIAGNOSIS — Z794 Long term (current) use of insulin: Secondary | ICD-10-CM

## 2022-02-19 DIAGNOSIS — Z79899 Other long term (current) drug therapy: Secondary | ICD-10-CM

## 2022-02-19 DIAGNOSIS — D692 Other nonthrombocytopenic purpura: Secondary | ICD-10-CM | POA: Insufficient documentation

## 2022-02-19 DIAGNOSIS — R159 Full incontinence of feces: Secondary | ICD-10-CM

## 2022-02-19 DIAGNOSIS — M810 Age-related osteoporosis without current pathological fracture: Secondary | ICD-10-CM

## 2022-02-19 DIAGNOSIS — E785 Hyperlipidemia, unspecified: Secondary | ICD-10-CM | POA: Diagnosis not present

## 2022-02-19 DIAGNOSIS — I10 Essential (primary) hypertension: Secondary | ICD-10-CM

## 2022-02-19 DIAGNOSIS — E538 Deficiency of other specified B group vitamins: Secondary | ICD-10-CM | POA: Diagnosis not present

## 2022-02-19 DIAGNOSIS — F339 Major depressive disorder, recurrent, unspecified: Secondary | ICD-10-CM

## 2022-02-19 DIAGNOSIS — W57XXXD Bitten or stung by nonvenomous insect and other nonvenomous arthropods, subsequent encounter: Secondary | ICD-10-CM

## 2022-02-19 DIAGNOSIS — E1129 Type 2 diabetes mellitus with other diabetic kidney complication: Secondary | ICD-10-CM

## 2022-02-19 DIAGNOSIS — R809 Proteinuria, unspecified: Secondary | ICD-10-CM

## 2022-02-19 NOTE — Progress Notes (Signed)
Name: Lindsey [name]   MRN: [id number]    DOB: Jul 08, 1941   Date:02/19/2022       Progress Note  Subjective  Chief Complaint  Tick Bite  HPI  Tick bite: she came in because she noticed scab came off and was oozing a clear liquid. No longer has pain, redness resolved. No fever or chills  Fecal incontinence: she was seen at [facility] last year, we can see summary but not sure what the plan was, we will obtain records. She states continues to have bowel urgency and sometimes unable to make it to the bathroom, usually after breakfast. She eats a bowel of cereal with Almond milk. Discussed trying to change to a toast to see if symptoms improves or resolves, not usually a problem after other meals of the day  DMII: not due for repeat A1C yet, but is due for urine micro, lipid panel and comp panel and we will obtain labs today  Dyslipidemia: last LDL was above goal, we will recheck labs   Hypothyroidism: she states good appetite, she was malnourished last year but feeling better and weight has gone up and stabilized. We will recheck TSH. She has stable dry skin  B12 deficiency: we will recheck labs today   Senile purpura: arms , stable and reassurance given   MDD: she is still feeling sorry about herself , gets frustrated about having to depend on others due to her pain. Uses a wheelchair, but feeling much better on zoloft and appetite is now normal.   She came in with her grand-daughter Aldona Bar today   Patient Active Problem List   Diagnosis Date Noted   Primary osteoarthritis of both knees 01/06/2021   Low serum vitamin B12 01/06/2021   Chronic bilateral low back pain with right-sided sciatica 01/06/2021   Dyslipidemia associated with type 2 diabetes mellitus ([place]) 01/06/2021   Chronic pain syndrome 01/06/2021   Incontinence of feces 10/28/2020   History of breast cancer 06/28/2018   Bilateral lower extremity edema 01/16/2016   Osteoporosis, 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    Controlled type 2 diabetes mellitus with microalbuminuria ([place]) 04/25/2015   Hyperlipidemia 04/25/2015   Benign hypertension 04/25/2015   Adult hypothyroidism 04/25/2015   At risk for falling 04/25/2015   Major depression ([place]) 04/25/2015   Post menopausal syndrome 04/25/2015    Past Surgical History:  Procedure Laterality Date   BREAST BIOPSY Left 11-04-15   BREAST BIOPSY Left 10-10-07   CATARACT EXTRACTION Bilateral 1990   CHOLECYSTECTOMY     IR KYPHO EA ADDL LEVEL THORACIC OR LUMBAR  12/25/2020   IR KYPHO LUMBAR INC FX REDUCE BONE BX UNI/BIL CANNULATION INC/IMAGING  12/25/2020   IR RADIOLOGIST EVAL & MGMT  12/17/2020   IR VERTEBROPLASTY LUMBAR BX INC UNI/BIL INC/INJECT/IMAGING  12/25/2020   MASTECTOMY Right 1983   Dr Myrle Sheng   MASTECTOMY Right 1983   MASTECTOMY W/ SENTINEL NODE BIOPSY Left 12/12/2015   Procedure: MASTECTOMY WITH SENTINEL LYMPH NODE BIOPSY;  Surgeon: Christene Lye, MD;  Location: [facility];  Service: General;  Laterality: Left;   ORIF ULNAR FRACTURE Left 12/11/2018   Procedure: OPEN REDUCTION INTERNAL FIXATION (ORIF) LEFT PROXIMAL ULNA FRACTURE AND  RADIAL HEAD REPLACEMENT;  Surgeon: Milly Jakob, MD;  Location: [facility];  Service: Orthopedics;  Laterality: Left;   RADIAL HEAD ARTHROPLASTY Left 12/11/2018   Procedure: OPEN REDUCTION INTERNAL FIXATION (ORIF) LEFT PROXIMAL ULNA FRACTURE AND  RADIAL HEAD REPLACEMENT;  Surgeon: Milly Jakob, MD;  Location: [facility];  Service: Orthopedics;  Laterality: Left;    Family History  Problem Relation Age of Onset   Kidney disease Mother    Heart disease Father    Stroke Sister    Cancer Sister        breast   Breast cancer Sister    Stroke Brother    Diabetes Brother    Hypertension Brother    Cancer Daughter 68       DCIS/lumpectomy/genetic negative   Breast cancer Daughter     Social History   Tobacco Use   Smoking status: Never   Smokeless tobacco: Never   Tobacco  comments:    Non-smoker. Smoking cessation materials contraindicated  Substance Use Topics   Alcohol use: No    Alcohol/week: 0.0 standard drinks of alcohol     Current Outpatient Medications:    amLODipine (NORVASC) 2.5 MG tablet, TAKE 1 TABLET(2.5 MG) BY MOUTH AT BEDTIME, Disp: 90 tablet, Rfl: 0   atorvastatin (LIPITOR) 40 MG tablet, TAKE 1 TABLET(40 MG) BY MOUTH DAILY, Disp: 90 tablet, Rfl: 1   blood glucose meter kit and supplies, Dispense based on patient and insurance preference. Use up to four times daily as directed. (FOR ICD-10 E10.9, E11.9)., Disp: 1 each, Rfl: 0   celecoxib (CELEBREX) 100 MG capsule, Take 1 capsule (100 mg total) by mouth 2 (two) times daily., Disp: 180 capsule, Rfl: 1   cholecalciferol (VITAMIN D) 1000 UNITS tablet, Take by mouth., Disp: , Rfl:    glucose blood (ONETOUCH ULTRA) test strip, USE TO TEST UP TO FOUR TIMES A DAY, Disp: 300 strip, Rfl: 2   hydrocortisone cream 1 %, Apply 1 application. topically 2 (two) times daily., Disp: 30 g, Rfl: 0   insulin degludec (TRESIBA FLEXTOUCH) 100 UNIT/ML FlexTouch Pen, INJECT 10 UNITS INTO THE SKIN ONCE DAILY, Disp: 15 mL, Rfl: 0   Insulin Pen Needle (B-D UF III MINI PEN NEEDLES) 31G X 5 MM MISC, USE AS DIRECTED BY DISCARD REMAINDER TO INJECT INSULIN, Disp: 100 each, Rfl: 3   ketoconazole (NIZORAL) 2 % cream, Apply topically 2 (two) times daily., Disp: 60 g, Rfl: 1   Lancets (ONETOUCH DELICA PLUS [id number]) MISC, USE TO TEST UP TO FOUR TIMES A DAY AS DIRECTED, Disp: 100 each, Rfl: 1   levothyroxine (SYNTHROID) 112 MCG tablet, Take 1 tablet (112 mcg total) by mouth daily. And half on Sundays, Disp: 90 tablet, Rfl: 1   lisinopril (ZESTRIL) 40 MG tablet, Take 1 tablet (40 mg total) by mouth at bedtime., Disp: 90 tablet, Rfl: 1   sertraline (ZOLOFT) 50 MG tablet, TAKE 1 TABLET(50 MG) BY MOUTH DAILY, Disp: 90 tablet, Rfl: 0  No Known Allergies  I personally reviewed active problem list, medication list, allergies, family  history, social history, health maintenance with the patient/caregiver today.   ROS  Ten systems reviewed and is negative except as mentioned in HPI   Objective  Vitals:   02/19/22 1422  BP: 128/78  Pulse: 83  Resp: 16  SpO2: 97%  Weight: 177 lb (80.3 kg)  Height: '5\' 1"'  (1.549 m)    Body mass index is 33.44 kg/m.  Physical Exam  Constitutional: Patient appears well-developed and well-nourished. Obese  No distress. Using a wheelchair  HEENT: head atraumatic, normocephalic, pupils equal and reactive to light,, neck supple Cardiovascular: Normal rate, regular rhythm and normal heart sounds.  No murmur heard. No BLE edema. Pulmonary/Chest: Effort normal and breath sounds normal. No respiratory distress. Abdominal: Soft.  There is  no tenderness. Psychiatric: Patient has a normal mood and affect. behavior is normal. Judgment and thought content normal.  Skin: healing wound on left lower leg, no signs of infection, no pain to touch   PHQ2/9:    02/19/2022    2:21 PM 12/24/2021    4:01 PM 12/24/2021    3:33 PM 12/10/2021    3:02 PM 11/06/2021    3:32 PM  Depression screen PHQ 2/9  Decreased Interest 2 0 0 0 0  Down, Depressed, Hopeless 1 0 0 0 0  PHQ - 2 Score 3 0 0 0 0  Altered sleeping 1 0 0 0 0  Tired, decreased energy 1 0 0 0 0  Change in appetite 0 0 0 0 0  Feeling bad or failure about yourself  3 0 0 0 0  Trouble concentrating 0 0 0 0 0  Moving slowly or fidgety/restless 0 0 0 0 0  Suicidal thoughts 0 0 0 0 0  PHQ-9 Score 8 0 0 0 0  Difficult doing work/chores  Not difficult at all Not difficult at all Not difficult at all Not difficult at all    phq 9 is positive   Fall Risk:    02/19/2022    2:20 PM 12/24/2021    4:02 PM 12/24/2021    3:33 PM 12/10/2021    2:59 PM 11/06/2021    3:32 PM  Fall Risk   Falls in the past year? 0 0 0 0 0  Number falls in past yr: 0 0 0 0 0  Injury with Fall? 0 0 0 0 0  Risk for fall due to : Impaired balance/gait;Impaired mobility No  Fall Risks     Follow up Falls prevention discussed Falls prevention discussed         Functional Status Survey: Is the patient deaf or have difficulty hearing?: Yes Does the patient have difficulty seeing, even when wearing glasses/contacts?: No Does the patient have difficulty concentrating, remembering, or making decisions?: No Does the patient have difficulty walking or climbing stairs?: Yes Does the patient have difficulty dressing or bathing?: No Does the patient have difficulty doing errands alone such as visiting a doctor's office or shopping?: No    Assessment & Plan  1. Hyperlipidemia associated with type 2 diabetes mellitus (HCC)  - Lipid panel - Microalbumin / creatinine urine ratio  2. Adult hypothyroidism  - TSH  3. Tick bite of left lower leg, subsequent encounter  Reassurance given   4. Low serum vitamin B12  - B12 and Folate Panel  5. Type 2 diabetes mellitus with microalbuminuria, with long-term current use of insulin (HCC)   6. Senile purpura (HCC)  Stable   7. Major depression, recurrent, chronic (HCC)  Continue medication  8. Long-term use of high-risk medication  - CBC with Differential/Platelet - COMPLETE METABOLIC PANEL WITH GFR  9. Complete fecal incontinence   We will contact [facility]

## 2022-02-20 LAB — MICROALBUMIN / CREATININE URINE RATIO
Creatinine, Urine: 203 mg/dL (ref 20–275)
Microalb Creat Ratio: 7 mcg/mg creat (ref <30)
Microalb, Ur: 1.5 mg/dL

## 2022-02-20 LAB — CBC WITH DIFFERENTIAL/PLATELET
Absolute Monocytes: 329 cells/uL (ref 200–950)
Basophils Absolute: 32 cells/uL (ref 0–200)
Basophils Relative: 0.6 %
Eosinophils Absolute: 211 cells/uL (ref 15–500)
Eosinophils Relative: 3.9 %
HCT: 41 % (ref 35.0–45.0)
Hemoglobin: 13.6 g/dL (ref 11.7–15.5)
Lymphs Abs: 1993 cells/uL (ref 850–3900)
MCH: 29.6 pg (ref 27.0–33.0)
MCHC: 33.2 g/dL (ref 32.0–36.0)
MCV: 89.3 fL (ref 80.0–100.0)
MPV: 11.5 fL (ref 7.5–12.5)
Monocytes Relative: 6.1 %
Neutro Abs: 2835 cells/uL (ref 1500–7800)
Neutrophils Relative %: 52.5 %
Platelets: 159 10*3/uL (ref 140–400)
RBC: 4.59 10*6/uL (ref 3.80–5.10)
RDW: 13.5 % (ref 11.0–15.0)
Total Lymphocyte: 36.9 %
WBC: 5.4 10*3/uL (ref 3.8–10.8)

## 2022-02-20 LAB — COMPLETE METABOLIC PANEL WITH GFR
AG Ratio: 2 (calc) (ref 1.0–2.5)
ALT: 17 U/L (ref 6–29)
AST: 18 U/L (ref 10–35)
Albumin: 4.5 g/dL (ref 3.6–5.1)
Alkaline phosphatase (APISO): 65 U/L (ref 37–153)
BUN/Creatinine Ratio: 31 (calc) — ABNORMAL HIGH (ref 6–22)
BUN: 28 mg/dL — ABNORMAL HIGH (ref 7–25)
CO2: 29 mmol/L (ref 20–32)
Calcium: 9.5 mg/dL (ref 8.6–10.4)
Chloride: 108 mmol/L (ref 98–110)
Creat: 0.91 mg/dL (ref 0.60–0.95)
Globulin: 2.3 g/dL (calc) (ref 1.9–3.7)
Glucose, Bld: 121 mg/dL — ABNORMAL HIGH (ref 65–99)
Potassium: 4.8 mmol/L (ref 3.5–5.3)
Sodium: 144 mmol/L (ref 135–146)
Total Bilirubin: 0.9 mg/dL (ref 0.2–1.2)
Total Protein: 6.8 g/dL (ref 6.1–8.1)
eGFR: 63 mL/min/{1.73_m2} (ref >=60)

## 2022-02-20 LAB — LIPID PANEL
Cholesterol: 143 mg/dL (ref <200)
HDL: 41 mg/dL — ABNORMAL LOW (ref >=50)
LDL Cholesterol (Calc): 81 mg/dL (calc)
Non-HDL Cholesterol (Calc): 102 mg/dL (calc) (ref <130)
Total CHOL/HDL Ratio: 3.5 (calc) (ref <5.0)
Triglycerides: 115 mg/dL (ref <150)

## 2022-02-20 LAB — B12 AND FOLATE PANEL
Folate: >24 ng/mL
Vitamin B-12: 1376 pg/mL — ABNORMAL HIGH (ref 200–1100)

## 2022-02-20 LAB — TSH: TSH: 0.86 mIU/L (ref 0.40–4.50)

## 2022-03-01 ENCOUNTER — Telehealth: Payer: Self-pay

## 2022-03-01 NOTE — Progress Notes (Signed)
  Chronic Care Management Pharmacy Assistant   Name: Lindsey [name]  MRN: [id number] DOB: 07/11/1941  Reason for Encounter: Hypertension Disease State Call   Recent office visits:  02/19/2022 Lindsey Sowles, MD (PCP Office Visit) for Insect Bite- Stopped: Ondansetron 4 mg due to patient not taking. Lab orders placed, patient to return in 2 months  Recent consult visits:  None ID  Hospital visits:  None in previous 6 months  Medications: Outpatient Encounter Medications as of 03/01/2022  Medication Sig   amLODipine (NORVASC) 2.5 MG tablet TAKE 1 TABLET(2.5 MG) BY MOUTH AT BEDTIME   atorvastatin (LIPITOR) 40 MG tablet TAKE 1 TABLET(40 MG) BY MOUTH DAILY   blood glucose meter kit and supplies Dispense based on patient and insurance preference. Use up to four times daily as directed. (FOR ICD-10 E10.9, E11.9).   celecoxib (CELEBREX) 100 MG capsule Take 1 capsule (100 mg total) by mouth 2 (two) times daily.   cholecalciferol (VITAMIN D) 1000 UNITS tablet Take by mouth.   glucose blood (ONETOUCH ULTRA) test strip USE TO TEST UP TO FOUR TIMES A DAY   hydrocortisone cream 1 % Apply 1 application. topically 2 (two) times daily.   insulin degludec (TRESIBA FLEXTOUCH) 100 UNIT/ML FlexTouch Pen INJECT 10 UNITS INTO THE SKIN ONCE DAILY   Insulin Pen Needle (B-D UF III MINI PEN NEEDLES) 31G X 5 MM MISC USE AS DIRECTED BY DISCARD REMAINDER TO INJECT INSULIN   ketoconazole (NIZORAL) 2 % cream Apply topically 2 (two) times daily.   Lancets (ONETOUCH DELICA PLUS LANCET30G) MISC USE TO TEST UP TO FOUR TIMES A DAY AS DIRECTED   levothyroxine (SYNTHROID) 112 MCG tablet Take 1 tablet (112 mcg total) by mouth daily. And half on Sundays   lisinopril (ZESTRIL) 40 MG tablet Take 1 tablet (40 mg total) by mouth at bedtime.   sertraline (ZOLOFT) 50 MG tablet TAKE 1 TABLET(50 MG) BY MOUTH DAILY   No facility-administered encounter medications on file as of 03/01/2022.   Care Gaps: Zoster Vaccine COVID-19  Vaccine Booster 3 Diabetic Foot Exam  Star Rating Drugs: Atorvastatin 40 mg last filled on 02/12/2022 for a 90-Day supply from [facility] Lisinopril 40 mg last filled on 02/10/2022 for a 90-Day supply from [facility]  Reviewed chart prior to disease state call. Spoke with patient regarding BP  Recent Office Vitals: BP Readings from Last 3 Encounters:  02/19/22 128/78  12/10/21 140/82  12/05/21 (!) 181/86   Pulse Readings from Last 3 Encounters:  02/19/22 83  12/10/21 82  12/05/21 68    Wt Readings from Last 3 Encounters:  02/19/22 177 lb (80.3 kg)  12/10/21 180 lb 14.4 oz (82.1 kg)  11/06/21 181 lb 8 oz (82.3 kg)   Kidney Function Lab Results  Component Value Date/Time   CREATININE 0.91 02/19/2022 03:11 PM   CREATININE 0.84 01/06/2021 11:31 AM   GFRNONAA 65 01/06/2021 11:31 AM   GFRAA 76 01/06/2021 11:31 AM      Latest Ref Rng & Units 02/19/2022    3:11 PM 01/06/2021   11:31 AM 09/12/2020    6:02 PM  BMP  Glucose 65 - 99 mg/dL 121  157  244   BUN 7 - 25 mg/dL 28  26  18   Creatinine 0.60 - 0.95 mg/dL 0.91  0.84  0.77   BUN/Creat Ratio 6 - 22 (calc) 31  31    Sodium 135 - 146 mmol/L 144  142  137   Potassium 3.5 -   5.3 mmol/L 4.8  4.1  4.0   Chloride 98 - 110 mmol/L 108  105  100   CO2 20 - 32 mmol/L _0 Calcium 8.6 - 10.4 mg/dL 9.5  9.8  8.9    Current antihypertensive regimen:  Amlodipine 2.5 mg nightly Lisinopril 40 mg daily   How often are you checking your Blood Pressure?  Patient does not check blood pressure at home  Current home BP readings: Patient last reading was in office at PCP 128/78  What recent interventions/DTPs have been made by any provider to improve Blood Pressure control since last CPP Visit: None ID  Any recent hospitalizations or ED visits since last visit with CPP? No  What diet changes have been made to improve Blood Pressure Control?  Patient stated she has not made any changes to her diet  What  exercise is being done to improve your Blood Pressure Control?  Patient does not exercise has she has severe back pain daily. Per patient medications really don't help with her pain  Adherence Review: Is the patient currently on ACE/ARB medication? Yes Does the patient have >5 day gap between last estimated fill dates? No  I spoke with the patient and she reports she is doing well as she can do. Patient does live with pain daily and per patient no medication is helping. Patient reports that the medications that she is on she does take as directed and denies needing any refills today. Patient has no concerns or issues at this time and is encouraged to give me a call if she needs anything.   Patient has a telephone follow-up appointment with [name], CPP on 07/28/2022 _1 .  07/10 Tried calling patient twice but received a busy signal both times  [name], CPA/CMA Clinical Pharmacist Assistant Phone: [phone]

## 2022-03-30 ENCOUNTER — Telehealth: Payer: Self-pay

## 2022-03-30 NOTE — Progress Notes (Signed)
Chronic Care Management Pharmacy Assistant   Name: Lindsey [name]  MRN: [id number] DOB: 19-Dec-1940  Reason for Encounter: Hypertension Disease State Call   Recent office visits:  None ID  Recent consult visits:  None ID  Hospital visits:  None in previous 6 months  Medications: Outpatient Encounter Medications as of 03/30/2022  Medication Sig   amLODipine (NORVASC) 2.5 MG tablet TAKE 1 TABLET(2.5 MG) BY MOUTH AT BEDTIME   atorvastatin (LIPITOR) 40 MG tablet TAKE 1 TABLET(40 MG) BY MOUTH DAILY   blood glucose meter kit and supplies Dispense based on patient and insurance preference. Use up to four times daily as directed. (FOR ICD-10 E10.9, E11.9).   celecoxib (CELEBREX) 100 MG capsule Take 1 capsule (100 mg total) by mouth 2 (two) times daily.   cholecalciferol (VITAMIN D) 1000 UNITS tablet Take by mouth.   glucose blood (ONETOUCH ULTRA) test strip USE TO TEST UP TO FOUR TIMES A DAY   hydrocortisone cream 1 % Apply 1 application. topically 2 (two) times daily.   insulin degludec (TRESIBA FLEXTOUCH) 100 UNIT/ML FlexTouch Pen INJECT 10 UNITS INTO THE SKIN ONCE DAILY   Insulin Pen Needle (B-D UF III MINI PEN NEEDLES) 31G X 5 MM MISC USE AS DIRECTED BY DISCARD REMAINDER TO INJECT INSULIN   ketoconazole (NIZORAL) 2 % cream Apply topically 2 (two) times daily.   Lancets (ONETOUCH DELICA PLUS [id number]) MISC USE TO TEST UP TO FOUR TIMES A DAY AS DIRECTED   levothyroxine (SYNTHROID) 112 MCG tablet Take 1 tablet (112 mcg total) by mouth daily. And half on Sundays   lisinopril (ZESTRIL) 40 MG tablet Take 1 tablet (40 mg total) by mouth at bedtime.   sertraline (ZOLOFT) 50 MG tablet TAKE 1 TABLET(50 MG) BY MOUTH DAILY   No facility-administered encounter medications on file as of 03/30/2022.   Care Gaps: Zoster Vaccine COVID-19 Vaccine Booster 3 Diabetic Foot Exam Influenza Vaccine  Star Rating Drugs: Atorvastatin 40 mg last filled on 02/12/2022 for a 90-Day supply from [facility] Lisinopril 40 mg last filled on 02/10/2022 for a 90-Day supply from [facility]  Reviewed chart prior to disease state call. Spoke with patient regarding BP  Recent Office Vitals: BP Readings from Last 3 Encounters:  02/19/22 128/78  12/10/21 140/82  12/05/21 (!) 181/86   Pulse Readings from Last 3 Encounters:  02/19/22 83  12/10/21 82  12/05/21 68    Wt Readings from Last 3 Encounters:  02/19/22 177 lb (80.3 kg)  12/10/21 180 lb 14.4 oz (82.1 kg)  11/06/21 181 lb 8 oz (82.3 kg)   Kidney Function Lab Results  Component Value Date/Time   CREATININE 0.91 02/19/2022 03:11 PM   CREATININE 0.84 01/06/2021 11:31 AM   GFRNONAA 65 01/06/2021 11:31 AM   GFRAA 76 01/06/2021 11:31 AM      Latest Ref Rng & Units 02/19/2022    3:11 PM 01/06/2021   11:31 AM 09/12/2020    6:02 PM  BMP  Glucose 65 - 99 mg/dL 121  157  244   BUN 7 - 25 mg/dL '28  26  18   ' Creatinine 0.60 - 0.95 mg/dL 0.91  0.84  0.77   BUN/Creat Ratio 6 - 22 (calc) 31  31    Sodium 135 - 146 mmol/L 144  142  137   Potassium 3.5 - 5.3 mmol/L 4.8  4.1  4.0   Chloride 98 - 110 mmol/L 108  105  100   CO2 20 -  32 mmol/L '29  28  27   ' Calcium 8.6 - 10.4 mg/dL 9.5  9.8  8.9    Current antihypertensive regimen:  Amlodipine 2.5 mg nightly Lisinopril 40 mg daily   How often are you checking your Blood Pressure?  Patient doesn't check her blood pressure at home  What recent interventions/DTPs have been made by any provider to improve Blood Pressure control since last CPP Visit: None ID  Any recent hospitalizations or ED visits since last visit with CPP? No  What diet changes have been made to improve Blood Pressure Control?  Patient stated she hasn't made any changes to her diet.  What exercise is being done to improve your Blood Pressure Control?  Patient doesn't exercise due to her daily pain  Adherence Review: Is the patient currently on ACE/ARB medication? Yes Does the patient have >5 day gap between  last estimated fill dates? No    I spoke to the patient and she reports that she is doing well. Patient denies any ill symptoms outside of the daily pain she is usually in. Patient is taking all medications as directed, and has no concerns or issues at this time. Patient instructed to call if she needs anything.  Patient has a telephone follow-up appointment with Lindsey [name], CPP on 07/28/2022 '@1500' .   Lindsey [name], CPA/CMA Clinical Pharmacist Assistant Phone: [phone]

## 2022-04-21 ENCOUNTER — Telehealth: Payer: Self-pay | Admitting: Internal Medicine

## 2022-04-21 ENCOUNTER — Other Ambulatory Visit: Payer: Self-pay | Admitting: Internal Medicine

## 2022-04-21 DIAGNOSIS — Z794 Long term (current) use of insulin: Secondary | ICD-10-CM

## 2022-04-22 NOTE — Telephone Encounter (Signed)
reordered today 04/22/22  Requested Prescriptions  Refused Prescriptions Disp Refills  . TRESIBA FLEXTOUCH 100 UNIT/ML FlexTouch Pen [Pharmacy Med Name: TRESIBA FLEXTOUCH PEN (U-100)[id number]] 15 mL 0    Sig: INJECT 10 UNITS INTO THE SKIN ONCE DAILY     Endocrinology:  Diabetes - Insulins Passed - 04/21/2022  2:31 PM      Passed - HBA1C is between 0 and 7.9 and within 180 days    Hgb A1c MFr Bld  Date Value Ref Range Status  11/06/2021 7.6 (H) <5.7 % of total Hgb Final    Comment:    For someone without known diabetes, a hemoglobin A1c value of 6.5% or greater indicates that they [place] have  diabetes and this should be confirmed with a follow-up  test. . For someone with known diabetes, a value <7% indicates  that their diabetes is well controlled and a value  greater than or equal to 7% indicates suboptimal  control. A1c targets should be individualized based on  duration of diabetes, age, comorbid conditions, and  other considerations. . Currently, no consensus exists regarding use of hemoglobin A1c for diagnosis of diabetes for children. Renella Cunas - Valid encounter within last 6 months    Recent Outpatient Visits          2 months ago Hyperlipidemia associated with type 2 diabetes mellitus [facility])   [facility] [place], Drue Stager, MD   3 months ago Tick bite of left lower leg, subsequent encounter   [place], DO   4 months ago Tick bite of left lower leg, subsequent encounter   [facility] Teodora Medici, DO   5 months ago Controlled type 2 diabetes mellitus with microalbuminuria, with long-term current use of insulin [facility])   [place], DO   11 months ago Dyslipidemia associated with type 2 diabetes mellitus [facility])   [facility] [name], MD      Future Appointments            In 1 week Teodora Medici, [facility], [facility]

## 2022-04-22 NOTE — Telephone Encounter (Signed)
Requested Prescriptions  Pending Prescriptions Disp Refills  . TRESIBA FLEXTOUCH 100 UNIT/ML FlexTouch Pen [Pharmacy Med Name: TRESIBA FLEXTOUCH PEN (U-100)INJ3ML] 15 mL 0    Sig: INJECT 10 UNITS INTO THE SKIN ONCE DAILY     Endocrinology:  Diabetes - Insulins Passed - 04/21/2022  2:31 PM      Passed - HBA1C is between 0 and 7.9 and within 180 days    Hgb A1c MFr Bld  Date Value Ref Range Status  11/06/2021 7.6 (H) <5.7 % of total Hgb Final    Comment:    For someone without known diabetes, a hemoglobin A1c value of 6.5% or greater indicates that they [place] have  diabetes and this should be confirmed with a follow-up  test. . For someone with known diabetes, a value <7% indicates  that their diabetes is well controlled and a value  greater than or equal to 7% indicates suboptimal  control. A1c targets should be individualized based on  duration of diabetes, age, comorbid conditions, and  other considerations. . Currently, no consensus exists regarding use of hemoglobin A1c for diagnosis of diabetes for children. Renella Cunas - Valid encounter within last 6 months    Recent Outpatient Visits          2 months ago Hyperlipidemia associated with type 2 diabetes mellitus [facility])   [facility] [place], Drue Stager, MD   3 months ago Tick bite of left lower leg, subsequent encounter   [place], DO   4 months ago Tick bite of left lower leg, subsequent encounter   [facility] Teodora Medici, DO   5 months ago Controlled type 2 diabetes mellitus with microalbuminuria, with long-term current use of insulin [facility])   [place], DO   11 months ago Dyslipidemia associated with type 2 diabetes mellitus [facility])   [facility] [name], MD      Future Appointments            In 1 week Teodora Medici, [facility], [facility]

## 2022-04-27 ENCOUNTER — Ambulatory Visit: Payer: PPO | Admitting: Internal Medicine

## 2022-05-04 ENCOUNTER — Ambulatory Visit (INDEPENDENT_AMBULATORY_CARE_PROVIDER_SITE_OTHER): Payer: PPO | Admitting: Internal Medicine

## 2022-05-04 VITALS — BP 116/78 | HR 73 | Temp 98.0°F | Resp 16 | Ht 61.0 in | Wt 179.2 lb

## 2022-05-04 DIAGNOSIS — E1129 Type 2 diabetes mellitus with other diabetic kidney complication: Secondary | ICD-10-CM | POA: Diagnosis not present

## 2022-05-04 DIAGNOSIS — E785 Hyperlipidemia, unspecified: Secondary | ICD-10-CM

## 2022-05-04 DIAGNOSIS — Z794 Long term (current) use of insulin: Secondary | ICD-10-CM

## 2022-05-04 DIAGNOSIS — Z23 Encounter for immunization: Secondary | ICD-10-CM

## 2022-05-04 DIAGNOSIS — E1169 Type 2 diabetes mellitus with other specified complication: Secondary | ICD-10-CM

## 2022-05-04 DIAGNOSIS — L602 Onychogryphosis: Secondary | ICD-10-CM

## 2022-05-04 DIAGNOSIS — R809 Proteinuria, unspecified: Secondary | ICD-10-CM | POA: Diagnosis not present

## 2022-05-04 DIAGNOSIS — F339 Major depressive disorder, recurrent, unspecified: Secondary | ICD-10-CM

## 2022-05-04 DIAGNOSIS — E039 Hypothyroidism, unspecified: Secondary | ICD-10-CM | POA: Diagnosis not present

## 2022-05-04 DIAGNOSIS — I10 Essential (primary) hypertension: Secondary | ICD-10-CM | POA: Diagnosis not present

## 2022-05-04 LAB — POCT GLYCOSYLATED HEMOGLOBIN (HGB A1C): Hemoglobin A1C: 7.4 % — AB (ref 4.0–5.6)

## 2022-05-04 MED ORDER — TRESIBA FLEXTOUCH 100 UNIT/ML ~~LOC~~ SOPN: 10.0000 [IU] | PEN_INJECTOR | Freq: Every day | SUBCUTANEOUS | 0 refills | Status: DC

## 2022-05-04 MED ORDER — AMLODIPINE BESYLATE 2.5 MG PO TABS: 2.5000 mg | ORAL_TABLET | Freq: Every day | ORAL | 1 refills | Status: DC

## 2022-05-04 MED ORDER — ATORVASTATIN CALCIUM 40 MG PO TABS: 40.0000 mg | ORAL_TABLET | Freq: Every day | ORAL | 1 refills | Status: DC

## 2022-05-04 MED ORDER — SERTRALINE HCL 50 MG PO TABS: ORAL_TABLET | ORAL | 0 refills | Status: DC

## 2022-05-04 MED ORDER — LISINOPRIL 40 MG PO TABS: 40.0000 mg | ORAL_TABLET | Freq: Every day | ORAL | 1 refills | Status: DC

## 2022-05-04 NOTE — Patient Instructions (Addendum)
It was great seeing you today!  Plan discussed at today's visit: -Medications refilled today  -Referral to Podiatry placed today   Follow up in: 6 months   Take care and let us know if you have any questions or concerns prior to your next visit.  Dr. Rosana Berger

## 2022-05-04 NOTE — Progress Notes (Signed)
Established Patient Office Visit  Subjective   Patient ID: Lindsey [name], female    DOB: 10/12/1940  Age: 81 y.o. MRN: [id number]  Chief Complaint  Patient presents with   Follow-up   Diabetes    HPI  Patient is here to follow up on chronic medical conditions.   Hypertension: -Medications: Lisinopril 40 mg, Amlodipine 2.5 mg  -Patient is compliant with above medications and reports no side effects. -Checking BP at home (average): not checking  -Denies any SOB, CP, vision changes, LE edema or symptoms of hypotension  HLD: -Medications: Lipitor 40 mg -Patient is compliant with above medications and reports no side effects.  -Last lipid panel: Lipid Panel     Component Value Date/Time   CHOL 143 02/19/2022 1511   CHOL 151 12/29/2015 0954   TRIG 115 02/19/2022 1511   HDL 41 (L) 02/19/2022 1511   HDL 53 12/29/2015 0954   CHOLHDL 3.5 02/19/2022 1511   VLDL 16 01/27/2017 1128   LDLCALC 81 02/19/2022 1511   LABVLDL 24 12/29/2015 0954   Diabetes, Type 2: -Last A1c 3/23 7.6% -Medications: Tresiba 10 units -Patient is compliant with the above medications and reports no side effects.  -Checking BG at home: 120-131. No hypoglycemic events -Eye exam: Due in October  -Foot exam: Due today -Microalbumin: UTD 6/23 -Statin: yes -PNA vaccine: Prevnar 20 due -Denies symptoms of hypoglycemia, polyuria, polydipsia, numbness extremities, foot ulcers/trauma.   Hypothyroidism: -Medications: Levothyroxine 112 mcg -Patient is compliant with the above medication (s) at the above dose and reports no medication side effects.  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, stable   MDD: -Mood status: stable -Current treatment: Zoloft 50 mg  -Satisfied with current treatment?: yes -Duration of current treatment : chronic -Side  effects: no Medication compliance: excellent compliance     05/04/2022    3:36 PM 02/19/2022    2:21 PM 12/24/2021    4:01 PM 12/24/2021    3:33 PM 12/10/2021    3:02 PM  Depression screen PHQ 2/9  Decreased Interest 0 2 0 0 0  Down, Depressed, Hopeless 0 1 0 0 0  PHQ - 2 Score 0 3 0 0 0  Altered sleeping 0 1 0 0 0  Tired, decreased energy 0 1 0 0 0  Change in appetite 0 0 0 0 0  Feeling bad or failure about yourself  0 3 0 0 0  Trouble concentrating 0 0 0 0 0  Moving slowly or fidgety/restless 0 0 0 0 0  Suicidal thoughts 0 0 0 0 0  PHQ-9 Score 0 8 0 0 0  Difficult doing work/chores Not difficult at all  Not difficult at all Not difficult at all Not difficult at all      Review of Systems  Constitutional:  Negative for chills and fever.  Eyes:  Negative for blurred vision.  Respiratory:  Negative for shortness of breath.   Cardiovascular:  Negative for chest pain.      Objective:     BP 116/78 (Patient Position: Sitting)   Pulse 73   Temp 98 F (36.7 C) (Oral)   Resp 16   Ht '5\' 1"'  (1.549 m)   Wt 179 lb 3.2 oz (81.3 kg)   SpO2 96%   BMI 33.86 kg/m  BP Readings from Last 3 Encounters:  05/04/22 116/78  02/19/22 128/78  12/10/21 140/82   Wt Readings from Last 3 Encounters:  05/04/22 179 lb 3.2 oz (81.3 kg)  02/19/22 177 lb (80.3 kg)  12/10/21 180 lb 14.4 oz (82.1 kg)      Physical Exam Constitutional:      Appearance: Normal appearance.  HENT:     Head: Normocephalic and atraumatic.  Eyes:     Conjunctiva/sclera: Conjunctivae normal.  Cardiovascular:     Rate and Rhythm: Normal rate and regular rhythm.     Pulses:          Dorsalis pedis pulses are 2+ on the right side and 2+ on the left side.  Pulmonary:     Effort: Pulmonary effort is normal.     Breath sounds: Normal breath sounds.  Musculoskeletal:     Right lower leg: No edema.     Left lower leg: No edema.     Right foot: Normal range of motion. No deformity, bunion, Charcot foot, foot drop or  prominent metatarsal heads.     Left foot: Normal range of motion. No deformity, bunion, Charcot foot, foot drop or prominent metatarsal heads.  Feet:     Right foot:     Protective Sensation: 6 sites tested.  6 sites sensed.     Skin integrity: Skin integrity normal.     Toenail Condition: Right toenails are abnormally thick and long.     Left foot:     Protective Sensation: 5 sites tested.  6 sites sensed.     Skin integrity: Skin integrity normal.     Toenail Condition: Left toenails are abnormally thick and long.  Skin:    General: Skin is warm and dry.  Neurological:     General: No focal deficit present.     Mental Status: She is alert. Mental status is at baseline.  Psychiatric:        Mood and Affect: Mood normal.        Behavior: Behavior normal.      Results for orders placed or performed in visit on 05/04/22  POCT HgB A1C  Result Value Ref Range   Hemoglobin A1C 7.4 (A) 4.0 - 5.6 %   HbA1c POC (<> result, manual entry)     HbA1c, POC (prediabetic range)     HbA1c, POC (controlled diabetic range)      Last CBC Lab Results  Component Value Date   WBC 5.4 02/19/2022   HGB 13.6 02/19/2022   HCT 41.0 02/19/2022   MCV 89.3 02/19/2022   MCH 29.6 02/19/2022   RDW 13.5 02/19/2022   PLT 159 09/32/3557   Last metabolic panel Lab Results  Component Value Date   GLUCOSE 121 (H) 02/19/2022   NA 144 02/19/2022   K 4.8 02/19/2022   CL 108 02/19/2022   CO2 29 02/19/2022   BUN 28 (H) 02/19/2022   CREATININE 0.91 02/19/2022   EGFR 63 02/19/2022   CALCIUM 9.5 02/19/2022   PHOS 3.9 03/31/2020   PROT 6.8 02/19/2022   ALBUMIN 4.4 09/30/2016   LABGLOB 2.6 01/16/2016   AGRATIO 1.7 01/16/2016   BILITOT 0.9 02/19/2022   ALKPHOS 50 09/30/2016   AST 18 02/19/2022   ALT 17 02/19/2022   ANIONGAP 10 09/12/2020   Last lipids Lab Results  Component Value Date   CHOL 143 02/19/2022   HDL 41 (L) 02/19/2022   LDLCALC 81 02/19/2022   TRIG 115 02/19/2022   CHOLHDL 3.5  02/19/2022   Last hemoglobin A1c Lab Results  Component Value Date   HGBA1C 7.4 (A) 05/04/2022   Last thyroid functions Lab Results  Component Value Date   TSH 0.86 02/19/2022   Last vitamin D Lab Results  Component Value Date   VD25OH 40 11/24/2017   Last vitamin B12 and Folate Lab Results  Component Value Date   VITAMINB12 1,376 (H) 02/19/2022   FOLATE >24.0 02/19/2022      The ASCVD Risk score (Arnett DK, et al., 2019) failed to calculate for the following reasons:   The 2019 ASCVD risk score is only valid for ages 79 to 79    Assessment & Plan:   1. Controlled type 2 diabetes mellitus with microalbuminuria, with long-term current use of insulin (HCC)/Overgrown toenails: A1c 7.4% today.  Continue Tresiba 10 units daily, refilled today.  Diabetic foot exam done today, referral to podiatry for overgrown toenails placed today.  Follow-up in 6 months.  - POCT HgB A1C - [facility] Diabetes Foot Exam - insulin degludec (TRESIBA FLEXTOUCH) 100 UNIT/ML FlexTouch Pen; Inject 10 Units into the skin daily.  Dispense: 18 mL; Refill: 0 - Ambulatory referral to Podiatry  2. Benign hypertension: Chronic and stable.  Blood pressure at goal today.  Continue lisinopril 40 mg and amlodipine 2.5 mg today.  - lisinopril (ZESTRIL) 40 MG tablet; Take 1 tablet (40 mg total) by mouth at bedtime.  Dispense: 90 tablet; Refill: 1 - amLODipine (NORVASC) 2.5 MG tablet; Take 1 tablet (2.5 mg total) by mouth daily.  Dispense: 90 tablet; Refill: 1  3. Dyslipidemia associated with type 2 diabetes mellitus ([place]): Stable.  Reviewed the patient's last lipid panel in June with the patient, cholesterol well controlled.  Continue Lipitor 40 mg daily, refilled today.  - atorvastatin (LIPITOR) 40 MG tablet; Take 1 tablet (40 mg total) by mouth daily.  Dispense: 90 tablet; Refill: 1  4. Hypothyroidism, unspecified type: Stable.  TSH normal this summer controlled.  Continue levothyroxine 112 mcg daily.  5. Major  depression, recurrent, chronic ([place]): Chronic and stable.  Continue Zoloft 50 mg daily, refilled today.  - sertraline (ZOLOFT) 50 MG tablet; TAKE 1 TABLET(50 MG) BY MOUTH DAILY  Dispense: 90 tablet; Refill: 0  6. Need for immunization against influenza: Flu vaccine administered today. - Flu Vaccine QUAD High Dose(Fluad)  Return in about 6 months (around 11/02/2022).    Teodora Medici, DO

## 2022-05-18 ENCOUNTER — Ambulatory Visit: Payer: PPO | Admitting: Podiatry

## 2022-05-18 DIAGNOSIS — M79675 Pain in left toe(s): Secondary | ICD-10-CM | POA: Diagnosis not present

## 2022-05-18 DIAGNOSIS — M79674 Pain in right toe(s): Secondary | ICD-10-CM

## 2022-05-18 DIAGNOSIS — B351 Tinea unguium: Secondary | ICD-10-CM

## 2022-05-22 NOTE — Progress Notes (Signed)
  Subjective:  Patient ID: Lindsey [name], female    DOB: [birth year],  MRN: [id number]  Chief Complaint  Patient presents with   Nail Problem    Nail trim    81 y.o. female returns for the above complaint.  Patient presents with thickened elongated dystrophic toenails x10 mild pain on palpation hurts with ambulation hurts with pressure she is not able to do it herself.  She would like me to debride them down.  Objective:  There were no vitals filed for this visit. Podiatric Exam: Vascular: dorsalis pedis and posterior tibial pulses are palpable bilateral. Capillary return is immediate. Temperature gradient is WNL. Skin turgor WNL  Sensorium: Normal Semmes Weinstein monofilament test. Normal tactile sensation bilaterally. Nail Exam: Pt has thick disfigured discolored nails with subungual debris noted bilateral entire nail hallux through fifth toenails.  Pain on palpation to the nails. Ulcer Exam: There is no evidence of ulcer or pre-ulcerative changes or infection. Orthopedic Exam: Muscle tone and strength are WNL. No limitations in general ROM. No crepitus or effusions noted.  Skin: No Porokeratosis. No infection or ulcers    Assessment & Plan:   1. Pain due to onychomycosis of toenails of both feet     Patient was evaluated and treated and all questions answered.  Onychomycosis with pain  -Nails palliatively debrided as below. -Educated on self-care  Procedure: Nail Debridement Rationale: pain  Type of Debridement: manual, sharp debridement. Instrumentation: Nail nipper, rotary burr. Number of Nails: 10  Procedures and Treatment: Consent by patient was obtained for treatment procedures. The patient understood the discussion of treatment and procedures well. All questions were answered thoroughly reviewed. Debridement of mycotic and hypertrophic toenails, 1 through 5 bilateral and clearing of subungual debris. No ulceration, no infection noted.  Return Visit-Office Procedure:  Patient instructed to return to the office for a follow up visit 3 months for continued evaluation and treatment.  Boneta Lucks, DPM    No follow-ups on file.

## 2022-07-28 ENCOUNTER — Ambulatory Visit: Payer: PPO

## 2022-07-28 DIAGNOSIS — I10 Essential (primary) hypertension: Secondary | ICD-10-CM

## 2022-07-28 DIAGNOSIS — R809 Proteinuria, unspecified: Secondary | ICD-10-CM

## 2022-07-28 DIAGNOSIS — E1129 Type 2 diabetes mellitus with other diabetic kidney complication: Secondary | ICD-10-CM

## 2022-07-28 NOTE — Progress Notes (Signed)
Chronic Care Management Pharmacy Note  08/20/2022 Name:  Kimm Sider Garfinkel MRN:  [id number] DOB:  1941-07-20  Summary: Patient presents today for follow-up.   She has been having multiple teeth pulled.   Recommendations/Changes made from today's visit: Continue current medications  Plan: CPP follow-up in 6 months   Subjective: Cheronda Erck Coleson is an 81 y.o. year old female who is a primary patient of Teodora Medici, DO.  The CCM team was consulted for assistance with disease management and care coordination needs.    Engaged with patient by telephone for follow up visit in response to provider referral for pharmacy case management and/or care coordination services.   Consent to Services:  The patient was given information about Chronic Care Management services, agreed to services, and gave verbal consent prior to initiation of services.  Please see initial visit note for detailed documentation.   Patient Care Team: Teodora Medici, DO as PCP - General (Internal Medicine) Melrose Nakayama, MD as Consulting Physician (Orthopedic Surgery) Germaine Pomfret, [facility] as Pharmacist (Pharmacist)  Recent office visits: 05/04/22: Patient presented to Dr. Rosana Berger for follow-up. A1c 7.4%. Celecoxib stopped.   12/24/21: Patient presented to Dr. Rosana Berger for tick bite.   12/10/21: Patient presented to Dr. Rosana Berger for tick bite.  11/06/21: Patient presented to Dr. Rosana Berger for follow-up. A1c 7.6%.   Recent consult visits: None in previous 6 months  Hospital visits: None in previous 6 months   Objective:  Lab Results  Component Value Date   CREATININE 0.91 02/19/2022   BUN 28 (H) 02/19/2022   GFRNONAA 65 01/06/2021   GFRAA 76 01/06/2021   NA 144 02/19/2022   K 4.8 02/19/2022   CALCIUM 9.5 02/19/2022   CO2 29 02/19/2022   GLUCOSE 121 (H) 02/19/2022    Lab Results  Component Value Date/Time   HGBA1C 7.4 (A) 05/04/2022 03:41 PM   HGBA1C 7.6 (H) 11/06/2021 04:10 PM   HGBA1C 7.0 (A)  05/11/2021 10:37 AM   HGBA1C 11.8 (H) 12/19/2019 11:34 AM   MICROALBUR 1.5 02/19/2022 03:11 PM   MICROALBUR 8.0 01/06/2021 11:31 AM   MICROALBUR NEG 07/01/2016 10:10 AM    Last diabetic Eye exam:  Lab Results  Component Value Date/Time   HMDIABEYEEXA Retinopathy (A) 06/22/2021 12:00 AM    Last diabetic Foot exam: No results found for: "HMDIABFOOTEX"   Lab Results  Component Value Date   CHOL 143 02/19/2022   HDL 41 (L) 02/19/2022   LDLCALC 81 02/19/2022   TRIG 115 02/19/2022   CHOLHDL 3.5 02/19/2022       Latest Ref Rng & Units 02/19/2022    3:11 PM 01/06/2021   11:31 AM 12/19/2019   11:34 AM  Hepatic Function  Total Protein 6.1 - 8.1 g/dL 6.8  7.4  6.8   AST 10 - 35 U/L _0 ALT 6 - 29 U/L _1 Total Bilirubin 0.2 - 1.2 mg/dL 0.9  0.9  0.8     Lab Results  Component Value Date/Time   TSH 0.86 02/19/2022 03:11 PM   TSH 3.59 01/06/2021 11:31 AM       Latest Ref Rng & Units 02/19/2022    3:11 PM 01/06/2021   11:31 AM 12/19/2020   10:20 AM  CBC  WBC 3.8 - 10.8 Thousand/uL 5.4  7.2  4.6   Hemoglobin 11.7 - 15.5 g/dL 13.6  14.9  13.0   Hematocrit 35.0 - 45.0 % 41.0  45.8  40.7   Platelets 140 - 400 Thousand/uL 159  233  208     Lab Results  Component Value Date/Time   VD25OH 40 11/24/2017 10:57 AM   VD25OH 42 09/30/2016 10:07 AM    Clinical ASCVD: No  The ASCVD Risk score (Arnett DK, et al., 2019) failed to calculate for the following reasons:   The 2019 ASCVD risk score is only valid for ages 75 to 22       07/28/2022    3:14 PM 05/04/2022    3:36 PM 02/19/2022    2:21 PM  Depression screen PHQ 2/9  Decreased Interest 0 0 2  Down, Depressed, Hopeless 0 0 1  PHQ - 2 Score 0 0 3  Altered sleeping 0 0 1  Tired, decreased energy  0 1  Change in appetite 0 0 0  Feeling bad or failure about yourself  0 0 3  Trouble concentrating 0 0 0  Moving slowly or fidgety/restless 0 0 0  Suicidal thoughts 0 0 0  PHQ-9 Score 0 0 8  Difficult doing  work/chores  Not difficult at all     Social History   Tobacco Use  Smoking Status Never  Smokeless Tobacco Never  Tobacco Comments   Non-smoker. Smoking cessation materials contraindicated   BP Readings from Last 3 Encounters:  05/04/22 116/78  02/19/22 128/78  12/10/21 140/82   Pulse Readings from Last 3 Encounters:  05/04/22 73  02/19/22 83  12/10/21 82   Wt Readings from Last 3 Encounters:  05/04/22 179 lb 3.2 oz (81.3 kg)  02/19/22 177 lb (80.3 kg)  12/10/21 180 lb 14.4 oz (82.1 kg)   BMI Readings from Last 3 Encounters:  05/04/22 33.86 kg/m  02/19/22 33.44 kg/m  12/10/21 34.18 kg/m   -Last DEXA Scan: 04/30/2020    T-Score femoral neck: -2.7  T-Score total hip: -2.6  T-Score lumbar spine: -4.6  Assessment/Interventions: Review of patient past medical history, allergies, medications, health status, including review of consultants reports, laboratory and other test data, was performed as part of comprehensive evaluation and provision of chronic care management services.   SDOH:  (Social Determinants of Health) assessments and interventions performed: Yes SDOH Interventions    Flowsheet Row Office Visit from 02/19/2022 in [place] Management from 01/27/2022 in [place] from 12/24/2021 in [place] Management from 08/05/2021 in [facility] Office Visit from 03/11/2021 in [place] Management from 02/04/2021 in [place] Interventions -- -- Intervention Not Indicated -- -- --  Housing Interventions -- -- Intervention Not Indicated -- -- --  Transportation Interventions -- -- Intervention Not Indicated -- -- --  Depression Interventions/Treatment  Currently on Treatment -- -- -- Currently on Treatment --  Financial Strain Interventions -- Intervention  Not Indicated Intervention Not Indicated Intervention Not Indicated -- Intervention Not Indicated  Physical Activity Interventions -- -- Intervention Not Indicated -- -- --  Stress Interventions -- -- Intervention Not Indicated -- -- --  Social Connections Interventions -- -- Intervention Not Indicated -- -- --        SDOH Screenings   Food Insecurity: No Food Insecurity (12/24/2021)  Housing: [place]  (12/24/2021)  Transportation Needs: No Transportation Needs (12/24/2021)  Alcohol Screen: Low Risk  (12/24/2021)  Depression (PHQ2-9): Low Risk  (07/28/2022)  Financial Resource Strain: Low  Risk  (02/16/2022)  Physical Activity: Inactive (12/24/2021)  Social Connections: Moderately Isolated (12/24/2021)  Stress: No Stress Concern Present (12/24/2021)  Tobacco Use: Low Risk  (02/19/2022)    CCM Care Plan  No Known Allergies  Medications Reviewed Today     Reviewed by Teodora Medici, DO (Physician) on 05/04/22 at 67  Med List Status: <None>   Medication Order Taking? Sig Documenting Provider Last Dose Status Informant  amLODipine (NORVASC) 2.5 MG tablet [id number]  Take 1 tablet (2.5 mg total) by mouth daily. Teodora Medici, DO  Active   atorvastatin (LIPITOR) 40 MG tablet [id number]  Take 1 tablet (40 mg total) by mouth daily. Teodora Medici, DO  Active   blood glucose meter kit and supplies [id number] No Dispense based on patient and insurance preference. Use up to four times daily as directed. (FOR ICD-10 E10.9, E11.9). [name], MD Taking Active   cholecalciferol (VITAMIN D) 1000 UNITS tablet [id number] No Take by mouth. [provider] Taking Active            Med Note Gloris Ham, [place] Miners D   Tue Dec 04, 2019  2:26 PM)    glucose blood [facility] ULTRA) test strip [id number] No USE TO TEST UP TO FOUR TIMES A Casimer Lanius, DO Taking Active   hydrocortisone cream 1 % [id number] No Apply 1 application. topically 2 (two) times daily. Teodora Medici, DO Taking Active    insulin degludec (TRESIBA FLEXTOUCH) 100 UNIT/ML FlexTouch Pen [id number]  Inject 10 Units into the skin daily. Teodora Medici, DO  Active   Insulin Pen Needle (B-D UF III MINI PEN NEEDLES) 31G X 5 MM MISC [id number] No USE AS DIRECTED BY DISCARD REMAINDER TO INJECT INSULIN Teodora Medici, DO Taking Active   ketoconazole (NIZORAL) 2 % cream [id number] No Apply topically 2 (two) times daily. [name], MD Taking Active   Lancets (ONETOUCH DELICA PLUS TDHRCB63A) [place] [id number] No USE TO TEST UP TO FOUR TIMES A DAY AS DIRECTED Teodora Medici, DO Taking Active   levothyroxine (SYNTHROID) 112 MCG tablet [id number] No Take 1 tablet (112 mcg total) by mouth daily. And half on Sundays Teodora Medici, DO Taking Active   lisinopril (ZESTRIL) 40 MG tablet [id number]  Take 1 tablet (40 mg total) by mouth at bedtime. Teodora Medici, DO  Active   sertraline (ZOLOFT) 50 MG tablet [id number]  TAKE 1 TABLET(50 MG) BY MOUTH DAILY Teodora Medici, DO  Active             Patient Active Problem List   Diagnosis Date Noted   Senile purpura ([place]) 02/19/2022   Primary osteoarthritis of both knees 01/06/2021   Low serum vitamin B12 01/06/2021   Chronic bilateral low back pain with right-sided sciatica 01/06/2021   Type 2 diabetes mellitus with microalbuminuria, with long-term current use of insulin ([place]) 01/06/2021   Chronic pain syndrome 01/06/2021   Complete fecal incontinence 10/28/2020   History of breast cancer 06/28/2018   Bilateral lower extremity edema 01/16/2016   Osteoporosis, 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   Controlled type 2 diabetes mellitus with microalbuminuria ([place]) 04/25/2015   Hyperlipidemia 04/25/2015   Benign hypertension 04/25/2015   Adult hypothyroidism 04/25/2015   At risk for falling 04/25/2015   Major depression, recurrent, chronic ([place]) 04/25/2015   Post menopausal syndrome 04/25/2015    Immunization History  Administered Date(s) Administered   Fluad  Quad(high Dose 65+) 05/30/2019, 06/23/2020, 05/04/2022   Influenza, High Dose Seasonal PF 06/12/2014, 06/09/2015, 07/01/2016, 04/29/2017, 06/28/2018, 05/11/2021   Influenza,inj,Quad PF,6+ Mos 07/23/2013  PFIZER(Purple Top)SARS-COV-2 Vaccination 09/29/2019, 10/20/2019   Pneumococcal Conjugate-13 08/13/2014   Pneumococcal Polysaccharide-23 07/29/2017   Tdap 06/23/2020    Conditions to be addressed/monitored:  Hypertension, Hyperlipidemia, Diabetes, Hypothyroidism, Osteoporosis, and Chronic Pain  Care Plan : General Pharmacy (Adult)  Updates made by Germaine Pomfret, RPH since 08/20/2022 12:00 AM     Problem: Hypertension, Hyperlipidemia, Diabetes, Hypothyroidism, Osteoporosis, and Chronic Pain   Priority: High     Long-Range Goal: Patient-Specific Goal   Start Date: 02/04/2021  Expected End Date: 08/21/2023  This Visit's Progress: On track  Recent Progress: On track  Priority: High  Note:   Current Barriers:  Unable to achieve control of osteoporosis   Pharmacist Clinical Goal(s):  Patient will achieve control of osteoporosis as evidenced by prevention of bone fractures maintain control of diabetes as evidenced by A1c less than 8%  through collaboration with PharmD and provider.   Interventions: 1:1 collaboration with [name], MD regarding development and update of comprehensive plan of care as evidenced by provider attestation and co-signature Inter-disciplinary care team collaboration (see longitudinal plan of care) Comprehensive medication review performed; medication list updated in electronic medical record  Hypertension (BP goal <140/90) -Uncontrolled -Current treatment: Amlodipine 2.5 mg nightly Lisinopril 40 mg daily  -Medications previously tried: NA  -Current home readings: does not monitor at home.  -Denies hypotensive/hypertensive symptoms -Counseled to monitor BP at home weekly, document, and provide log at future appointments -Recommended to  continue current medication  Hyperlipidemia: (LDL goal < 100) -Controlled -Current treatment: Atorvastatin 40 mg daily  -Medications previously tried: NA  -Recommended to continue current medication  Diabetes (A1c goal <8%) -Controlled -Current medications: Tresiba 10 units daily  -Medications previously tried: Metformin Januvia,Glipizide   -Current home glucose readings Before Supper: 130-221  -Denies hypoglycemic/hyperglycemic symptoms -Current dietary patterns: Eating milk shakes and soup.  -Recommended to continue current medication  Osteoporosis / Osteopenia (Goal Prevent bone fractures) -Controlled -Patient is a candidate for pharmacologic treatment due to T-Score < -2.5 in femoral neck, T-Score < -2.5 in total hip , and T-Score < -2.5 in lumbar spine -Current treatment  Calcium + D 600-800 daily  -Medications previously tried: Alendronate (ineffective), Prolia (cost)  -Recommended to continue current medication  Depression/Anxiety (Goal: Maintain stable mood) -Uncontrolled -Current treatment: Sertraline 50 mg daily  -Medications previously tried/failed: NA -PHQ9: 0 -Recommended to continue current medication  Hypothyroidism (Goal: Maintain stable thyroid function) -Controlled -Current treatment  Levothyroxine 112 mcg 1/2 tablet on Sunday, 1 tablet all other days  -Medications previously tried: NA  -Recommended to continue current medication  Patient Goals/Self-Care Activities Patient will:  - check glucose daily before breakfast, document, and provide at future appointments check blood pressure weekly, document, and provide at future appointments  Follow Up Plan: Telephone follow up appointment with care management team member scheduled for:  02/02/2023 at 2:00 PM     Medication Assistance: None required.  Patient affirms current coverage meets needs.  Compliance/Adherence/Medication fill history: Care Gaps: Shingrix  Covid Booster   Star-Rating  Drugs: Atorvastatin 40 mg last filled on 03/18/2021 for 90 day supply at [facility]. Lisinopril 40 mg last filled on 03/18/2021 for 90 day supply at [facility].  Patient's preferred pharmacy is:  [id number] - [place], [place] - [address] [place] [address] Phone: [phone] Fax: [place], [place]  [place] [address] [place] [address] Phone: [phone] Fax: [phone]   Uses pill box? Yes Pt endorses 100% compliance  We discussed: Current pharmacy is preferred with insurance plan and patient is satisfied with pharmacy services Patient decided to: Continue current medication management strategy  Care Plan and Follow Up Patient Decision:  Patient agrees to Care Plan and Follow-up.  Plan: Telephone follow up appointment with care management team member scheduled for:  02/02/2023 at 2:00 PM  Malva Limes, [place] Pharmacist Practitioner  [facility] [phone]

## 2022-08-12 ENCOUNTER — Other Ambulatory Visit: Payer: Self-pay | Admitting: Internal Medicine

## 2022-08-12 DIAGNOSIS — E039 Hypothyroidism, unspecified: Secondary | ICD-10-CM

## 2022-08-13 ENCOUNTER — Other Ambulatory Visit: Payer: Self-pay | Admitting: Internal Medicine

## 2022-08-13 DIAGNOSIS — F339 Major depressive disorder, recurrent, unspecified: Secondary | ICD-10-CM

## 2022-08-13 NOTE — Telephone Encounter (Signed)
Requested Prescriptions  Pending Prescriptions Disp Refills   sertraline (ZOLOFT) 50 MG tablet [Pharmacy Med Name: SERTRALINE '50MG'$  TABLETS] 90 tablet 0    Sig: TAKE 1 TABLET(50 MG) BY MOUTH DAILY     Psychiatry:  Antidepressants - SSRI - sertraline Passed - 08/13/2022 11:57 AM      Passed - AST in normal range and within 360 days    AST  Date Value Ref Range Status  02/19/2022 18 10 - 35 U/L Final         Passed - ALT in normal range and within 360 days    ALT  Date Value Ref Range Status  02/19/2022 17 6 - 29 U/L Final         Passed - Completed PHQ-2 or PHQ-9 in the last 360 days      Passed - Valid encounter within last 6 months    Recent Outpatient Visits           3 months ago Controlled type 2 diabetes mellitus with microalbuminuria, with long-term current use of insulin [facility])   [place], DO   5 months ago Hyperlipidemia associated with type 2 diabetes mellitus [facility])   [facility], Drue Stager, MD   7 months ago Tick bite of left lower leg, subsequent encounter   [facility] Teodora Medici, DO   8 months ago Tick bite of left lower leg, subsequent encounter   [facility] Teodora Medici, DO   9 months ago Controlled type 2 diabetes mellitus with microalbuminuria, with long-term current use of insulin [facility])   [facility] Teodora Medici, DO       Future Appointments             In 2 months Teodora Medici, [facility], [facility]

## 2022-08-13 NOTE — Telephone Encounter (Signed)
Requested Prescriptions  Pending Prescriptions Disp Refills   levothyroxine (SYNTHROID) 112 MCG tablet [Pharmacy Med Name: LEVOTHYROXINE 0.'112MG'$  (112MCG) TABS] 90 tablet 1    Sig: TAKE 1 TABLET BY MOUTH DAILY AND HALF ON SUNDAYS     Endocrinology:  Hypothyroid Agents Passed - 08/12/2022  2:54 PM      Passed - TSH in normal range and within 360 days    TSH  Date Value Ref Range Status  02/19/2022 0.86 0.40 - 4.50 mIU/L Final         Passed - Valid encounter within last 12 months    Recent Outpatient Visits           3 months ago Controlled type 2 diabetes mellitus with microalbuminuria, with long-term current use of insulin [facility])   [facility] Teodora Medici, DO   5 months ago Hyperlipidemia associated with type 2 diabetes mellitus [facility])   [facility], Drue Stager, MD   7 months ago Tick bite of left lower leg, subsequent encounter   [facility] Teodora Medici, DO   8 months ago Tick bite of left lower leg, subsequent encounter   [facility] Teodora Medici, DO   9 months ago Controlled type 2 diabetes mellitus with microalbuminuria, with long-term current use of insulin [facility])   [facility] Teodora Medici, DO       Future Appointments             In 2 months Teodora Medici, [facility], [facility]

## 2022-08-20 NOTE — Patient Instructions (Signed)
Visit Information It was great speaking with you today!  Please let me know if you have any questions about our visit.   Goals Addressed             This Visit's Progress    Monitor and Manage My Blood Sugar-Diabetes Type 2   On track    Timeframe:  Long-Range Goal Priority:  High Start Date: 02/02/2021                            Expected End Date: 02/03/2023                      Follow Up within 90 days   - check blood sugar at prescribed times - check blood sugar if I feel it is too high or too low - enter blood sugar readings and medication or insulin into daily log    Why is this important?   Checking your blood sugar at home helps to keep it from getting very high or very low.  Writing the results in a diary or log helps the doctor know how to care for you.  Your blood sugar log should have the time, date and the results.  Also, write down the amount of insulin or other medicine that you take.  Other information, like what you ate, exercise done and how you were feeling, will also be helpful.     Notes:         Patient Care Plan: General Pharmacy (Adult)     Problem Identified: Hypertension, Hyperlipidemia, Diabetes, Hypothyroidism, Osteoporosis, and Chronic Pain   Priority: High     Long-Range Goal: Patient-Specific Goal   Start Date: 02/04/2021  Expected End Date: 08/21/2023  This Visit's Progress: On track  Recent Progress: On track  Priority: High  Note:   Current Barriers:  Unable to achieve control of osteoporosis   Pharmacist Clinical Goal(s):  Patient will achieve control of osteoporosis as evidenced by prevention of bone fractures maintain control of diabetes as evidenced by A1c less than 8%  through collaboration with PharmD and provider.   Interventions: 1:1 collaboration with [name], MD regarding development and update of comprehensive plan of care as evidenced by provider attestation and co-signature Inter-disciplinary care team  collaboration (see longitudinal plan of care) Comprehensive medication review performed; medication list updated in electronic medical record  Hypertension (BP goal <140/90) -Uncontrolled -Current treatment: Amlodipine 2.5 mg nightly Lisinopril 40 mg daily  -Medications previously tried: NA  -Current home readings: does not monitor at home.  -Denies hypotensive/hypertensive symptoms -Counseled to monitor BP at home weekly, document, and provide log at future appointments -Recommended to continue current medication  Hyperlipidemia: (LDL goal < 100) -Controlled -Current treatment: Atorvastatin 40 mg daily  -Medications previously tried: NA  -Recommended to continue current medication  Diabetes (A1c goal <8%) -Controlled -Current medications: Tresiba 10 units daily  -Medications previously tried: Metformin Januvia,Glipizide   -Current home glucose readings Before Supper: 130-221  -Denies hypoglycemic/hyperglycemic symptoms -Current dietary patterns: Eating milk shakes and soup.  -Recommended to continue current medication  Osteoporosis / Osteopenia (Goal Prevent bone fractures) -Controlled -Patient is a candidate for pharmacologic treatment due to T-Score < -2.5 in femoral neck, T-Score < -2.5 in total hip , and T-Score < -2.5 in lumbar spine -Current treatment  Calcium + D 600-800 daily  -Medications previously tried: Alendronate (ineffective), Prolia (cost)  -Recommended to continue current medication  Depression/Anxiety (Goal:  Maintain stable mood) -Uncontrolled -Current treatment: Sertraline 50 mg daily  -Medications previously tried/failed: NA -PHQ9: 0 -Recommended to continue current medication  Hypothyroidism (Goal: Maintain stable thyroid function) -Controlled -Current treatment  Levothyroxine 112 mcg 1/2 tablet on Sunday, 1 tablet all other days  -Medications previously tried: NA  -Recommended to continue current medication  Patient Goals/Self-Care  Activities Patient will:  - check glucose daily before breakfast, document, and provide at future appointments check blood pressure weekly, document, and provide at future appointments  Follow Up Plan: Telephone follow up appointment with care management team member scheduled for:  02/02/2023 at 2:00 PM    Patient agreed to services and verbal consent obtained.   Patient verbalizes understanding of instructions and care plan provided today and agrees to view in [place]. Active MyChart status and patient understanding of how to access instructions and care plan via MyChart confirmed with patient.     Malva Limes, [place] Pharmacist Practitioner  [facility] [phone]

## 2022-08-26 ENCOUNTER — Ambulatory Visit: Payer: PPO | Admitting: Podiatry

## 2022-10-05 ENCOUNTER — Encounter: Payer: Self-pay | Admitting: Podiatry

## 2022-10-05 ENCOUNTER — Ambulatory Visit (INDEPENDENT_AMBULATORY_CARE_PROVIDER_SITE_OTHER): Payer: PPO | Admitting: Podiatry

## 2022-10-05 DIAGNOSIS — M79674 Pain in right toe(s): Secondary | ICD-10-CM

## 2022-10-05 DIAGNOSIS — Z794 Long term (current) use of insulin: Secondary | ICD-10-CM | POA: Diagnosis not present

## 2022-10-05 DIAGNOSIS — R809 Proteinuria, unspecified: Secondary | ICD-10-CM | POA: Diagnosis not present

## 2022-10-05 DIAGNOSIS — M79675 Pain in left toe(s): Secondary | ICD-10-CM | POA: Diagnosis not present

## 2022-10-05 DIAGNOSIS — E1129 Type 2 diabetes mellitus with other diabetic kidney complication: Secondary | ICD-10-CM

## 2022-10-05 DIAGNOSIS — B351 Tinea unguium: Secondary | ICD-10-CM | POA: Diagnosis not present

## 2022-10-05 NOTE — Progress Notes (Signed)

## 2022-10-31 ENCOUNTER — Other Ambulatory Visit: Payer: Self-pay | Admitting: Internal Medicine

## 2022-10-31 DIAGNOSIS — E1169 Type 2 diabetes mellitus with other specified complication: Secondary | ICD-10-CM

## 2022-11-01 NOTE — Telephone Encounter (Signed)
Requested by interface surescripts. Last refill doc. 09/05/22 . Medication requested too soon. Requested Prescriptions  Refused Prescriptions Disp Refills   atorvastatin (LIPITOR) 40 MG tablet [Pharmacy Med Name: ATORVASTATIN '40MG'$  TABLETS] 90 tablet 1    Sig: TAKE 1 TABLET(40 MG) BY MOUTH DAILY     Cardiovascular:  Antilipid - Statins Failed - 10/31/2022  6:36 AM      Failed - Lipid Panel in normal range within the last 12 months    Cholesterol, Total  Date Value Ref Range Status  12/29/2015 151 100 - 199 mg/dL Final   Cholesterol  Date Value Ref Range Status  02/19/2022 143 <200 mg/dL Final   LDL Cholesterol (Calc)  Date Value Ref Range Status  02/19/2022 81 mg/dL (calc) Final    Comment:    Reference range: <100 . Desirable range <100 mg/dL for primary prevention;   <70 mg/dL for patients with CHD or diabetic patients  with > or = 2 CHD risk factors. [name] LDL-C is now calculated using the Martin-Hopkins  calculation, which is a validated novel method providing  better accuracy than the Friedewald equation in the  estimation of LDL-C.  Cresenciano Genre et al. Annamaria Helling. [id number]): 2061-2068  (http://education.[facility].com/faq/FAQ164)    HDL  Date Value Ref Range Status  02/19/2022 41 (L) > OR = 50 mg/dL Final  12/29/2015 53 >39 mg/dL Final   Triglycerides  Date Value Ref Range Status  02/19/2022 115 <150 mg/dL Final         Passed - Patient is not pregnant      Passed - Valid encounter within last 12 months    Recent Outpatient Visits           6 months ago Controlled type 2 diabetes mellitus with microalbuminuria, with long-term current use of insulin [facility])   [facility] Teodora Medici, DO   8 months ago Hyperlipidemia associated with type 2 diabetes mellitus [facility])   [facility] [name], MD   10 months ago Tick bite of left lower leg, subsequent encounter   [facility]  Teodora Medici, DO   10 months ago Tick bite of left lower leg, subsequent encounter   [facility] Teodora Medici, DO   12 months ago Controlled type 2 diabetes mellitus with microalbuminuria, with long-term current use of insulin [facility])   [place], DO       Future Appointments             Tomorrow Teodora Medici, [facility], [facility]

## 2022-11-01 NOTE — Progress Notes (Unsigned)
Established Patient Office Visit  Subjective   Patient ID: Lindsey [name], female    DOB: 12-18-1940  Age: 82 y.o. MRN: [id number]  No chief complaint on file.   HPI  Patient is here to follow up on chronic medical conditions.   Hypertension: -Medications: Lisinopril 40 mg, Amlodipine 2.5 mg  -Patient is compliant with above medications and reports no side effects. -Checking BP at home (average): not checking  -Denies any SOB, CP, vision changes, LE edema or symptoms of hypotension  HLD: -Medications: Lipitor 40 mg -Patient is compliant with above medications and reports no side effects.  -Last lipid panel: Lipid Panel     Component Value Date/Time   CHOL 143 02/19/2022 1511   CHOL 151 12/29/2015 0954   TRIG 115 02/19/2022 1511   HDL 41 (L) 02/19/2022 1511   HDL 53 12/29/2015 0954   CHOLHDL 3.5 02/19/2022 1511   VLDL 16 01/27/2017 1128   LDLCALC 81 02/19/2022 1511   LABVLDL 24 12/29/2015 0954   Diabetes, Type 2: -Last A1c 9/23 7.4% -Medications: Tresiba 10 units -Patient is compliant with the above medications and reports no side effects.  -Checking BG at home: 120-131. No hypoglycemic events -Eye exam: Due in October  -Foot exam: UTD 9/23 -Microalbumin: UTD 6/23 -Statin: yes -PNA vaccine: Prevnar 20 due -Denies symptoms of hypoglycemia, polyuria, polydipsia, numbness extremities, foot ulcers/trauma.   Hypothyroidism: -Medications: Levothyroxine 112 mcg -Patient is compliant with the above medication (s) at the above dose and reports no medication side effects.  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, stable   MDD: -Mood status: stable -Current treatment: Zoloft 50 mg  -Satisfied with current treatment?: yes -Duration of current treatment : chronic -Side effects: no Medication compliance:  excellent compliance     07/28/2022    3:14 PM 05/04/2022    3:36 PM 02/19/2022    2:21 PM 12/24/2021    4:01 PM 12/24/2021    3:33 PM  Depression screen PHQ 2/9  Decreased Interest 0 0 2 0 0  Down, Depressed, Hopeless 0 0 1 0 0  PHQ - 2 Score 0 0 3 0 0  Altered sleeping 0 0 1 0 0  Tired, decreased energy  0 1 0 0  Change in appetite 0 0 0 0 0  Feeling bad or failure about yourself  0 0 3 0 0  Trouble concentrating 0 0 0 0 0  Moving slowly or fidgety/restless 0 0 0 0 0  Suicidal thoughts 0 0 0 0 0  PHQ-9 Score 0 0 8 0 0  Difficult doing work/chores  Not difficult at all  Not difficult at all Not difficult at all      Review of Systems  Constitutional:  Negative for chills and fever.  Eyes:  Negative for blurred vision.  Respiratory:  Negative for shortness of breath.   Cardiovascular:  Negative for chest pain.      Objective:     There were no vitals taken for this visit. BP Readings from Last 3 Encounters:  05/04/22 116/78  02/19/22 128/78  12/10/21 140/82   Wt Readings from Last 3 Encounters:  05/04/22 179 lb 3.2 oz (81.3 kg)  02/19/22 177 lb (80.3 kg)  12/10/21 180 lb 14.4 oz (82.1 kg)      Physical Exam Constitutional:      Appearance: Normal  appearance.  HENT:     Head: Normocephalic and atraumatic.  Eyes:     Conjunctiva/sclera: Conjunctivae normal.  Cardiovascular:     Rate and Rhythm: Normal rate and regular rhythm.     Pulses:          Dorsalis pedis pulses are 2+ on the right side and 2+ on the left side.  Pulmonary:     Effort: Pulmonary effort is normal.     Breath sounds: Normal breath sounds.  Musculoskeletal:     Right lower leg: No edema.     Left lower leg: No edema.     Right foot: Normal range of motion. No deformity, bunion, Charcot foot, foot drop or prominent metatarsal heads.     Left foot: Normal range of motion. No deformity, bunion, Charcot foot, foot drop or prominent metatarsal heads.  Feet:     Right foot:     Protective  Sensation: 6 sites tested.  6 sites sensed.     Skin integrity: Skin integrity normal.     Toenail Condition: Right toenails are abnormally thick and long.     Left foot:     Protective Sensation: 5 sites tested.  6 sites sensed.     Skin integrity: Skin integrity normal.     Toenail Condition: Left toenails are abnormally thick and long.  Skin:    General: Skin is warm and dry.  Neurological:     General: No focal deficit present.     Mental Status: She is alert. Mental status is at baseline.  Psychiatric:        Mood and Affect: Mood normal.        Behavior: Behavior normal.      No results found for any visits on 11/02/22.   Last CBC Lab Results  Component Value Date   WBC 5.4 02/19/2022   HGB 13.6 02/19/2022   HCT 41.0 02/19/2022   MCV 89.3 02/19/2022   MCH 29.6 02/19/2022   RDW 13.5 02/19/2022   PLT 159 [id number]   Last metabolic panel Lab Results  Component Value Date   GLUCOSE 121 (H) 02/19/2022   NA 144 02/19/2022   K 4.8 02/19/2022   CL 108 02/19/2022   CO2 29 02/19/2022   BUN 28 (H) 02/19/2022   CREATININE 0.91 02/19/2022   EGFR 63 02/19/2022   CALCIUM 9.5 02/19/2022   PHOS 3.9 03/31/2020   PROT 6.8 02/19/2022   ALBUMIN 4.4 09/30/2016   LABGLOB 2.6 01/16/2016   AGRATIO 1.7 01/16/2016   BILITOT 0.9 02/19/2022   ALKPHOS 50 09/30/2016   AST 18 02/19/2022   ALT 17 02/19/2022   ANIONGAP 10 09/12/2020   Last lipids Lab Results  Component Value Date   CHOL 143 02/19/2022   HDL 41 (L) 02/19/2022   LDLCALC 81 02/19/2022   TRIG 115 02/19/2022   CHOLHDL 3.5 02/19/2022   Last hemoglobin A1c Lab Results  Component Value Date   HGBA1C 7.4 (A) 05/04/2022   Last thyroid functions Lab Results  Component Value Date   TSH 0.86 02/19/2022   Last vitamin D Lab Results  Component Value Date   VD25OH 40 11/24/2017   Last vitamin B12 and Folate Lab Results  Component Value Date   VITAMINB12 1,376 (H) 02/19/2022   FOLATE >24.0 02/19/2022       The ASCVD Risk score (Arnett DK, et al., 2019) failed to calculate for the following reasons:   The 2019 ASCVD risk score is only valid for ages 30  to 79    Assessment & Plan:   1. Controlled type 2 diabetes mellitus with microalbuminuria, with long-term current use of insulin (HCC)/Overgrown toenails: A1c 7.4% today.  Continue Tresiba 10 units daily, refilled today.  Diabetic foot exam done today, referral to podiatry for overgrown toenails placed today.  Follow-up in 6 months.  - POCT HgB A1C - HM Diabetes Foot Exam - insulin degludec (TRESIBA FLEXTOUCH) 100 UNIT/ML FlexTouch Pen; Inject 10 Units into the skin daily.  Dispense: 18 mL; Refill: 0 - Ambulatory referral to Podiatry  2. Benign hypertension: Chronic and stable.  Blood pressure at goal today.  Continue lisinopril 40 mg and amlodipine 2.5 mg today.  - lisinopril (ZESTRIL) 40 MG tablet; Take 1 tablet (40 mg total) by mouth at bedtime.  Dispense: 90 tablet; Refill: 1 - amLODipine (NORVASC) 2.5 MG tablet; Take 1 tablet (2.5 mg total) by mouth daily.  Dispense: 90 tablet; Refill: 1  3. Dyslipidemia associated with type 2 diabetes mellitus ([place]): Stable.  Reviewed the patient's last lipid panel in June with the patient, cholesterol well controlled.  Continue Lipitor 40 mg daily, refilled today.  - atorvastatin (LIPITOR) 40 MG tablet; Take 1 tablet (40 mg total) by mouth daily.  Dispense: 90 tablet; Refill: 1  4. Hypothyroidism, unspecified type: Stable.  TSH normal this summer controlled.  Continue levothyroxine 112 mcg daily.  5. Major depression, recurrent, chronic ([place]): Chronic and stable.  Continue Zoloft 50 mg daily, refilled today.  - sertraline (ZOLOFT) 50 MG tablet; TAKE 1 TABLET(50 MG) BY MOUTH DAILY  Dispense: 90 tablet; Refill: 0  6. Need for immunization against influenza: Flu vaccine administered today. - Flu Vaccine QUAD High Dose(Fluad)  No follow-ups on file.    Teodora Medici, DO

## 2022-11-02 ENCOUNTER — Ambulatory Visit (INDEPENDENT_AMBULATORY_CARE_PROVIDER_SITE_OTHER): Payer: PPO | Admitting: Internal Medicine

## 2022-11-02 ENCOUNTER — Encounter: Payer: Self-pay | Admitting: Internal Medicine

## 2022-11-02 VITALS — BP 136/80 | HR 68 | Temp 98.0°F | Resp 16 | Ht 61.0 in | Wt 182.4 lb

## 2022-11-02 DIAGNOSIS — E1169 Type 2 diabetes mellitus with other specified complication: Secondary | ICD-10-CM

## 2022-11-02 DIAGNOSIS — E1129 Type 2 diabetes mellitus with other diabetic kidney complication: Secondary | ICD-10-CM

## 2022-11-02 DIAGNOSIS — E785 Hyperlipidemia, unspecified: Secondary | ICD-10-CM | POA: Diagnosis not present

## 2022-11-02 DIAGNOSIS — I10 Essential (primary) hypertension: Secondary | ICD-10-CM

## 2022-11-02 DIAGNOSIS — Z23 Encounter for immunization: Secondary | ICD-10-CM | POA: Diagnosis not present

## 2022-11-02 DIAGNOSIS — E039 Hypothyroidism, unspecified: Secondary | ICD-10-CM

## 2022-11-02 DIAGNOSIS — F339 Major depressive disorder, recurrent, unspecified: Secondary | ICD-10-CM | POA: Diagnosis not present

## 2022-11-02 DIAGNOSIS — M1990 Unspecified osteoarthritis, unspecified site: Secondary | ICD-10-CM | POA: Diagnosis not present

## 2022-11-02 DIAGNOSIS — R809 Proteinuria, unspecified: Secondary | ICD-10-CM | POA: Diagnosis not present

## 2022-11-02 DIAGNOSIS — Z794 Long term (current) use of insulin: Secondary | ICD-10-CM

## 2022-11-02 LAB — POCT GLYCOSYLATED HEMOGLOBIN (HGB A1C): Hemoglobin A1C: 7.4 % — AB (ref 4.0–5.6)

## 2022-11-02 MED ORDER — LISINOPRIL 40 MG PO TABS: 40.0000 mg | ORAL_TABLET | Freq: Every day | ORAL | 1 refills | Status: DC

## 2022-11-02 MED ORDER — LEVOTHYROXINE SODIUM 112 MCG PO TABS: 112.0000 ug | ORAL_TABLET | Freq: Every day | ORAL | 1 refills | Status: DC

## 2022-11-02 MED ORDER — CELECOXIB 100 MG PO CAPS: 100.0000 mg | ORAL_CAPSULE | Freq: Every day | ORAL | 0 refills | Status: DC | PRN

## 2022-11-02 MED ORDER — TRESIBA FLEXTOUCH 100 UNIT/ML ~~LOC~~ SOPN: 10.0000 [IU] | PEN_INJECTOR | Freq: Every day | SUBCUTANEOUS | 0 refills | Status: DC

## 2022-11-02 MED ORDER — AMLODIPINE BESYLATE 2.5 MG PO TABS: 2.5000 mg | ORAL_TABLET | Freq: Every day | ORAL | 1 refills | Status: DC

## 2022-11-02 MED ORDER — SERTRALINE HCL 50 MG PO TABS: 50.0000 mg | ORAL_TABLET | Freq: Every day | ORAL | 1 refills | Status: DC

## 2022-11-02 MED ORDER — ATORVASTATIN CALCIUM 40 MG PO TABS: 40.0000 mg | ORAL_TABLET | Freq: Every day | ORAL | 1 refills | Status: DC

## 2022-11-02 NOTE — Patient Instructions (Addendum)
It was great seeing you today!  Plan discussed at today's visit: -A1c 7.4% -Medications refilled -Prevnar 20 vaccine today -Try Celebrex 100 mg as needed for pain - take with food and avoid all other anti-inflammatories while taking this medication -Recommend zinc oxide barrier cream for skin irritation, moisturize regularly   Follow up in: 6 months   Take care and let us know if you have any questions or concerns prior to your next visit.  Dr. Rosana Berger

## 2022-11-29 ENCOUNTER — Other Ambulatory Visit: Payer: Self-pay | Admitting: Internal Medicine

## 2022-11-29 DIAGNOSIS — Z794 Long term (current) use of insulin: Secondary | ICD-10-CM

## 2022-11-30 NOTE — Telephone Encounter (Signed)
Requested Prescriptions  Pending Prescriptions Disp Refills   glucose blood (ONETOUCH ULTRA) test strip [Pharmacy Med Name: ONE TOUCH ULTRA BLUE TESTST(NEW)100] 300 strip 2    Sig: USE TO TEST FOUR TIMES DAILY     Endocrinology: Diabetes - Testing Supplies Passed - 11/29/2022  2:55 PM      Passed - Valid encounter within last 12 months    Recent Outpatient Visits           4 weeks ago Type 2 diabetes mellitus with microalbuminuria, with long-term current use of insulin [facility])   [place] [facility] Margarita Mail, DO   7 months ago Controlled type 2 diabetes mellitus with microalbuminuria, with long-term current use of insulin [facility])   [facility] [facility] Margarita Mail, DO   9 months ago Hyperlipidemia associated with type 2 diabetes mellitus [facility])   [facility] Alba Cory, MD   11 months ago Tick bite of left lower leg, subsequent encounter   [facility] Margarita Mail, DO   11 months ago Tick bite of left lower leg, subsequent encounter   [facility] Margarita Mail, DO       Future Appointments             In 5 months Margarita Mail, DO [facility], [facility]

## 2022-12-06 DIAGNOSIS — E113393 Type 2 diabetes mellitus with moderate nonproliferative diabetic retinopathy without macular edema, bilateral: Secondary | ICD-10-CM | POA: Diagnosis not present

## 2022-12-09 ENCOUNTER — Telehealth: Payer: Self-pay | Admitting: Internal Medicine

## 2022-12-09 NOTE — Telephone Encounter (Signed)
Contacted Lindsey [name] to schedule their annual wellness visit. Appointment made for 01/06/2023.  [facility] Care Guide [facility] AWV TEAM Direct Dial: [phone]

## 2022-12-21 ENCOUNTER — Other Ambulatory Visit: Payer: Self-pay | Admitting: Internal Medicine

## 2022-12-21 DIAGNOSIS — R809 Proteinuria, unspecified: Secondary | ICD-10-CM

## 2022-12-22 NOTE — Telephone Encounter (Signed)
Requested Prescriptions  Pending Prescriptions Disp Refills   B-D UF III MINI PEN NEEDLES 31G X 5 MM MISC [Pharmacy Med Name: B-D PEN NDL MINI 31GX5MM(3/16)PRPL] 100 each 0    Sig: USE AS DIRECTED BY DISCARD REMAINDER TO INJECT INSULIN     Endocrinology: Diabetes - Testing Supplies Passed - 12/21/2022  5:29 PM      Passed - Valid encounter within last 12 months    Recent Outpatient Visits           1 month ago Type 2 diabetes mellitus with microalbuminuria, with long-term current use of insulin [facility])   [place] [facility] Margarita Mail, DO   7 months ago Controlled type 2 diabetes mellitus with microalbuminuria, with long-term current use of insulin [facility])   [place] [facility] Margarita Mail, DO   10 months ago Hyperlipidemia associated with type 2 diabetes mellitus [facility])   [place] [facility] Alba Cory, MD   12 months ago Tick bite of left lower leg, subsequent encounter   [facility] Margarita Mail, DO   1 year ago Tick bite of left lower leg, subsequent encounter   [facility] Margarita Mail, DO       Future Appointments             In 4 months Margarita Mail, DO [facility], [facility]

## 2023-01-06 ENCOUNTER — Ambulatory Visit (INDEPENDENT_AMBULATORY_CARE_PROVIDER_SITE_OTHER): Payer: PPO

## 2023-01-06 VITALS — Ht 61.0 in | Wt 182.0 lb

## 2023-01-06 DIAGNOSIS — Z0000 Encounter for general adult medical examination without abnormal findings: Secondary | ICD-10-CM | POA: Diagnosis not present

## 2023-01-06 NOTE — Patient Instructions (Signed)
Lindsey [name] , Thank you for taking time to come for your Medicare Wellness Visit. I appreciate your ongoing commitment to your health goals. Please review the following plan we discussed and let me know if I can assist you in the future.   These are the goals we discussed:  Goals      DIET - INCREASE WATER INTAKE     Recommend to drink 6-8 8oz glasses of water per day     Monitor and Manage My Blood Sugar-Diabetes Type 2     Timeframe:  Long-Range Goal Priority:  High Start Date: 02/02/2021                            Expected End Date: 02/03/2023                      Follow Up within 90 days   - check blood sugar at prescribed times - check blood sugar if I feel it is too high or too low - enter blood sugar readings and medication or insulin into daily log    Why is this important?   Checking your blood sugar at home helps to keep it from getting very high or very low.  Writing the results in a diary or log helps the doctor know how to care for you.  Your blood sugar log should have the time, date and the results.  Also, write down the amount of insulin or other medicine that you take.  Other information, like what you ate, exercise done and how you were feeling, will also be helpful.     Notes:         This is a list of the screening recommended for you and due dates:  Health Maintenance  Topic Date Due   COVID-19 Vaccine (3 - Pfizer risk series) 11/17/2019   Eye exam for diabetics  06/22/2022   Zoster (Shingles) Vaccine (1 of 2) 02/02/2023*   Yearly kidney function blood test for diabetes  02/20/2023   Yearly kidney health urinalysis for diabetes  02/20/2023   Flu Shot  03/24/2023   Complete foot exam   05/05/2023   Hemoglobin A1C  05/05/2023   Medicare Annual Wellness Visit  01/06/2024   DTaP/Tdap/Td vaccine (2 - Td or Tdap) 06/23/2030   Pneumonia Vaccine  Completed   DEXA scan (bone density measurement)  Completed   HPV Vaccine  Aged Out  *Topic was postponed. The date  shown is not the original due date.    Advanced directives: yes  Conditions/risks identified: low falls risk  Next appointment: Follow up in one year for your annual wellness visit 01/12/2024 @ 11:30am telephone   Preventive Care 65 Years and Older, Female Preventive care refers to lifestyle choices and visits with your health care provider that can promote health and wellness. What does preventive care include? A yearly physical exam. This is also called an annual well check. Dental exams once or twice a year. Routine eye exams. Ask your health care provider how often you should have your eyes checked. Personal lifestyle choices, including: Daily care of your teeth and gums. Regular physical activity. Eating a healthy diet. Avoiding tobacco and drug use. Limiting alcohol use. Practicing safe sex. Taking low-dose aspirin every day. Taking vitamin and mineral supplements as recommended by your health care provider. What happens during an annual well check? The services and screenings done by your health care provider during  your annual well check will depend on your age, overall health, lifestyle risk factors, and family history of disease. Counseling  Your health care provider [place] ask you questions about your: Alcohol use. Tobacco use. Drug use. Emotional well-being. Home and relationship well-being. Sexual activity. Eating habits. History of falls. Memory and ability to understand (cognition). Work and work Astronomer. Reproductive health. Screening  You Shiller have the following tests or measurements: Height, weight, and BMI. Blood pressure. Lipid and cholesterol levels. These Kappes be checked every 5 years, or more frequently if you are over 74 years old. Skin check. Lung cancer screening. You Macken have this screening every year starting at age 32 if you have a 30-pack-year history of smoking and currently smoke or have quit within the past 15 years. Fecal occult blood  test (FOBT) of the stool. You Morken have this test every year starting at age 29. Flexible sigmoidoscopy or colonoscopy. You Bring have a sigmoidoscopy every 5 years or a colonoscopy every 10 years starting at age 51. Hepatitis C blood test. Hepatitis B blood test. Sexually transmitted disease (STD) testing. Diabetes screening. This is done by checking your blood sugar (glucose) after you have not eaten for a while (fasting). You Mccalister have this done every 1-3 years. Bone density scan. This is done to screen for osteoporosis. You [place] have this done starting at age 80. Mammogram. This Babe be done every 1-2 years. Talk to your health care provider about how often you should have regular mammograms. Talk with your health care provider about your test results, treatment options, and if necessary, the need for more tests. Vaccines  Your health care provider [place]arpelli recommend certain vaccines, such as: Influenza vaccine. This is recommended every year. Tetanus, diphtheria, and acellular pertussis (Tdap, Td) vaccine. You [place] need a Td booster every 10 years. Zoster vaccine. You [place]cher need this after age 23. Pneumococcal 13-valent conjugate (PCV13) vaccine. One dose is recommended after age 56. Pneumococcal polysaccharide (PPSV23) vaccine. One dose is recommended after age 68. Talk to your health care provider about which screenings and vaccines you need and how often you need them. This information is not intended to replace advice given to you by your health care provider. Make sure you discuss any questions you have with your health care provider. Document Released: 09/05/2015 Document Revised: 04/28/2016 Document Reviewed: 06/10/2015 Elsevier Interactive Patient Education  2017 [employer].  Fall Prevention in the Home Falls can cause injuries. They can happen to people of all ages. There are many things you can do to make your home safe and to help prevent falls. What can I do on the outside of my  home? Regularly fix the edges of walkways and driveways and fix any cracks. Remove anything that might make you trip as you walk through a door, such as a raised step or threshold. Trim any bushes or trees on the path to your home. Use bright outdoor lighting. Clear any walking paths of anything that might make someone trip, such as rocks or tools. Regularly check to see if handrails are loose or broken. Make sure that both sides of any steps have handrails. Any raised decks and porches should have guardrails on the edges. Have any leaves, snow, or ice cleared regularly. Use sand or salt on walking paths during winter. Clean up any spills in your garage right away. This includes oil or grease spills. What can I do in the bathroom? Use night lights. Install grab bars by the toilet and  in the tub and shower. Do not use towel bars as grab bars. Use non-skid mats or decals in the tub or shower. If you need to sit down in the shower, use a plastic, non-slip stool. Keep the floor dry. Clean up any water that spills on the floor as soon as it happens. Remove soap buildup in the tub or shower regularly. Attach bath mats securely with double-sided non-slip rug tape. Do not have throw rugs and other things on the floor that can make you trip. What can I do in the bedroom? Use night lights. Make sure that you have a light by your bed that is easy to reach. Do not use any sheets or blankets that are too big for your bed. They should not hang down onto the floor. Have a firm chair that has side arms. You can use this for support while you get dressed. Do not have throw rugs and other things on the floor that can make you trip. What can I do in the kitchen? Clean up any spills right away. Avoid walking on wet floors. Keep items that you use a lot in easy-to-reach places. If you need to reach something above you, use a strong step stool that has a grab bar. Keep electrical cords out of the way. Do  not use floor polish or wax that makes floors slippery. If you must use wax, use non-skid floor wax. Do not have throw rugs and other things on the floor that can make you trip. What can I do with my stairs? Do not leave any items on the stairs. Make sure that there are handrails on both sides of the stairs and use them. Fix handrails that are broken or loose. Make sure that handrails are as long as the stairways. Check any carpeting to make sure that it is firmly attached to the stairs. Fix any carpet that is loose or worn. Avoid having throw rugs at the top or bottom of the stairs. If you do have throw rugs, attach them to the floor with carpet tape. Make sure that you have a light switch at the top of the stairs and the bottom of the stairs. If you do not have them, ask someone to add them for you. What else can I do to help prevent falls? Wear shoes that: Do not have high heels. Have rubber bottoms. Are comfortable and fit you well. Are closed at the toe. Do not wear sandals. If you use a stepladder: Make sure that it is fully opened. Do not climb a closed stepladder. Make sure that both sides of the stepladder are locked into place. Ask someone to hold it for you, if possible. Clearly mark and make sure that you can see: Any grab bars or handrails. First and last steps. Where the edge of each step is. Use tools that help you move around (mobility aids) if they are needed. These include: Canes. Walkers. Scooters. Crutches. Turn on the lights when you go into a dark area. Replace any light bulbs as soon as they burn out. Set up your furniture so you have a clear path. Avoid moving your furniture around. If any of your floors are uneven, fix them. If there are any pets around you, be aware of where they are. Review your medicines with your doctor. Some medicines can make you feel dizzy. This can increase your chance of falling. Ask your doctor what other things that you can do to  help prevent falls. This  information is not intended to replace advice given to you by your health care provider. Make sure you discuss any questions you have with your health care provider. Document Released: 06/05/2009 Document Revised: 01/15/2016 Document Reviewed: 09/13/2014 Elsevier Interactive Patient Education  2017 [employer].

## 2023-01-06 NOTE — Progress Notes (Signed)
I connected with  Lindsey [name] on 01/06/23 by a audio enabled telemedicine application and verified that I am speaking with the correct person using two identifiers.  Patient Location: Home  Provider Location: Office/Clinic  I discussed the limitations of evaluation and management by telemedicine. The patient expressed understanding and agreed to proceed.  Subjective:   Lindsey [name] is a 82 y.o. female who presents for Medicare Annual (Subsequent) preventive examination.  Review of Systems    Cardiac Risk Factors include: advanced age (>40men, >70 women);diabetes mellitus;dyslipidemia;hypertension;obesity (BMI >30kg/m2);sedentary lifestyle    Objective:    Today's Vitals   01/06/23 1153  Weight: 182 lb (82.6 kg)  Height: 5\' 1"  (1.549 m)  PainSc: 4    Body mass index is 34.39 kg/m.     01/06/2023   12:02 PM 12/24/2021    4:02 PM 12/23/2020    3:58 PM 09/12/2020    5:12 PM 12/04/2019    2:27 PM 12/08/2018    1:56 PM 07/29/2017   10:03 AM  Advanced Directives  Does Patient Have a Medical Advance Directive? Yes Yes Yes No No Yes Yes  Type of Estate agent of [place];Living will Healthcare Power of [place];Living will Healthcare Power of [place];Living will   Healthcare Power of [place];Living will Healthcare Power of [place];Living will  Does patient want to make changes to medical advance directive?      No - Patient declined   Copy of Healthcare Power of Attorney in Chart?  No - copy requested No - copy requested    No - copy requested  Would patient like information on creating a medical advance directive?    No - Patient declined No - Patient declined      Current Medications (verified) Outpatient Encounter Medications as of 01/06/2023  Medication Sig   amLODipine (NORVASC) 2.5 MG tablet Take 1 tablet (2.5 mg total) by mouth daily.   atorvastatin (LIPITOR) 40 MG tablet Take 1 tablet (40 mg total) by mouth daily.   blood glucose meter kit and supplies  Dispense based on patient and insurance preference. Use up to four times daily as directed. (FOR ICD-10 E10.9, E11.9).   celecoxib (CELEBREX) 100 MG capsule Take 1 capsule (100 mg total) by mouth daily as needed for moderate pain.   cholecalciferol (VITAMIN D) 1000 UNITS tablet Take by mouth.   glucose blood (ONETOUCH ULTRA) test strip USE TO TEST FOUR TIMES DAILY   hydrocortisone cream 1 % Apply 1 application. topically 2 (two) times daily.   insulin degludec (TRESIBA FLEXTOUCH) 100 UNIT/ML FlexTouch Pen Inject 10 Units into the skin daily.   Insulin Pen Needle (B-D UF III MINI PEN NEEDLES) 31G X 5 MM MISC USE AS DIRECTED BY DISCARD REMAINDER TO INJECT INSULIN   ketoconazole (NIZORAL) 2 % cream Apply topically 2 (two) times daily.   Lancets (ONETOUCH DELICA PLUS LANCET30G) MISC USE TO TEST UP TO FOUR TIMES A DAY AS DIRECTED   levothyroxine (SYNTHROID) 112 MCG tablet Take 1 tablet (112 mcg total) by mouth daily before breakfast.   lisinopril (ZESTRIL) 40 MG tablet Take 1 tablet (40 mg total) by mouth at bedtime.   sertraline (ZOLOFT) 50 MG tablet Take 1 tablet (50 mg total) by mouth daily.   No facility-administered encounter medications on file as of 01/06/2023.    Allergies (verified) Patient has no known allergies.   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   Bursitis    R knee   Closed fracture of left ulna and radius    Complication of anesthesia    Depression    Diabetes mellitus type 2, controlled, without complications (HCC) 04/25/2015   Diabetes mellitus without complication (HCC)    GERD (gastroesophageal reflux disease)    TUMS   Hyperlipidemia    Hypertension    Hypothyroidism    Osteoporosis    Osteoporosis, 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   PONV (postoperative nausea and  vomiting)    Thyroid disease    Past Surgical History:  Procedure Laterality Date   BREAST BIOPSY Left 11-04-15   BREAST BIOPSY Left 10-10-07   CATARACT EXTRACTION Bilateral 1990   CHOLECYSTECTOMY     IR KYPHO EA ADDL LEVEL THORACIC OR LUMBAR  12/25/2020   IR KYPHO LUMBAR INC FX REDUCE BONE BX UNI/BIL CANNULATION INC/IMAGING  12/25/2020   IR RADIOLOGIST EVAL & MGMT  12/17/2020   IR VERTEBROPLASTY LUMBAR BX INC UNI/BIL INC/INJECT/IMAGING  12/25/2020   MASTECTOMY Right 1983   Dr Isac Sarna   MASTECTOMY Right 1983   MASTECTOMY W/ SENTINEL NODE BIOPSY Left 12/12/2015   Procedure: MASTECTOMY WITH SENTINEL LYMPH NODE BIOPSY;  Surgeon: Kieth Brightly, MD;  Location: [facility];  Service: General;  Laterality: Left;   ORIF ULNAR FRACTURE Left 12/11/2018   Procedure: OPEN REDUCTION INTERNAL FIXATION (ORIF) LEFT PROXIMAL ULNA FRACTURE AND  RADIAL HEAD REPLACEMENT;  Surgeon: Mack Hook, MD;  Location: [facility];  Service: Orthopedics;  Laterality: Left;   RADIAL HEAD ARTHROPLASTY Left 12/11/2018   Procedure: OPEN REDUCTION INTERNAL FIXATION (ORIF) LEFT PROXIMAL ULNA FRACTURE AND  RADIAL HEAD REPLACEMENT;  Surgeon: Mack Hook, MD;  Location: [facility];  Service: Orthopedics;  Laterality: Left;   Family History  Problem Relation Age of Onset   Kidney disease Mother    Heart disease Father    Stroke Sister    Cancer Sister        breast   Breast cancer Sister    Stroke Brother    Diabetes Brother    Hypertension Brother    Cancer Daughter 23       DCIS/lumpectomy/genetic negative   Breast cancer Daughter    Social History   Socioeconomic History   Marital status: Married    Spouse name: Not on file   Number of children: 3   Years of education: Not on file   Highest education level: Not on file  Occupational History   Occupation: Retired  Tobacco Use   Smoking status: Never   Smokeless tobacco: Never   Tobacco comments:    Non-smoker. Smoking  cessation materials contraindicated  Vaping Use   Vaping Use: Never used  Substance and Sexual Activity   Alcohol use: No    Alcohol/week: 0.0 standard drinks of alcohol   Drug use: No   Sexual activity: Never  Other Topics Concern   Not on file  Social History Narrative   Not on file   Social Determinants of Health   Financial Resource Strain: Low Risk  (01/06/2023)   Overall Financial Resource Strain (CARDIA)    Difficulty of Paying Living Expenses: Not hard at all  Food Insecurity: No Food Insecurity (01/06/2023)   Hunger Vital Sign    Worried About Running Out of Food in the Last Year: Never true  Ran Out of Food in the Last Year: Never true  Transportation Needs: No Transportation Needs (01/06/2023)   PRAPARE - Administrator, Civil Service (Medical): No    Lack of Transportation (Non-Medical): No  Physical Activity: Inactive (01/06/2023)   Exercise Vital Sign    Days of Exercise per Week: 0 days    Minutes of Exercise per Session: 0 min  Stress: Stress Concern Present (01/06/2023)   [employer] of Occupational Health - Occupational Stress Questionnaire    Feeling of Stress : Rather much  Social Connections: Moderately Isolated (01/06/2023)   Social Connection and Isolation Panel [NHANES]    Frequency of Communication with Friends and Family: Not on file    Frequency of Social Gatherings with Friends and Family: Three times a week    Attends Religious Services: Never    Active Member of Clubs or Organizations: No    Attends Banker Meetings: Never    Marital Status: Married    Tobacco Counseling Counseling given: Not Answered Tobacco comments: Non-smoker. Smoking cessation materials contraindicated  Clinical Intake:  Pre-visit preparation completed: Yes  Pain : 0-10 Pain Score: 4  Pain Type: Chronic pain Pain Location: Back Pain Orientation: Lower Pain Descriptors / Indicators: Aching Pain Relieving Factors: tylenol  Pain  Relieving Factors: tylenol  BMI - recorded: 34.39 Nutritional Status: BMI > 30  Obese Nutritional Risks: None Diabetes: Yes CBG done?: No Did pt. bring in CBG monitor from home?: No  How often do you need to have someone help you when you read instructions, pamphlets, or other written materials from your doctor or pharmacy?: 1 - Never  Diabetic?yes  Interpreter Needed?: No  Comments: lives with husband Information entered by :: B.Montee Tallman,LPN   Activities of Daily Living    01/06/2023   12:03 PM 11/02/2022    3:16 PM  In your present state of health, do you have any difficulty performing the following activities:  Hearing? 1 1  Vision? 1 0  Difficulty concentrating or making decisions? 1 0  Walking or climbing stairs? 1 1  Dressing or bathing? 0 0  Doing errands, shopping? 1 0  Preparing Food and eating ? N   Using the Toilet? N   In the past six months, have you accidently leaked urine? Y   Do you have problems with loss of bowel control? N   Managing your Medications? N   Managing your Finances? N   Housekeeping or managing your Housekeeping? Y   Comment family helps with chores     Patient Care Team: Margarita Mail, DO as PCP - General (Internal Medicine) Marcene Corning, MD as Consulting Physician (Orthopedic Surgery) Gaspar Cola, [facility] as Pharmacist (Pharmacist)  Indicate any recent Medical Services you Marcinek have received from other than [facility] providers in the past year (date Hitt be approximate).     Assessment:   This is a routine wellness examination for Lorice.  Hearing/Vision screen Hearing Screening - Comments:: Inadequate hearing has hearing aids... Vision Screening - Comments:: Adequate vision Dr Hulen Luster  Dietary issues and exercise activities discussed: Exercise limited by: orthopedic condition(s)   Goals Addressed             This Visit's Progress    DIET - INCREASE WATER INTAKE   On track    Recommend to drink 6-8 8oz  glasses of water per day     Monitor and Manage My Blood Sugar-Diabetes Type 2   On track  Timeframe:  Long-Range Goal Priority:  High Start Date: 02/02/2021                            Expected End Date: 02/03/2023                      Follow Up within 90 days   - check blood sugar at prescribed times - check blood sugar if I feel it is too high or too low - enter blood sugar readings and medication or insulin into daily log    Why is this important?   Checking your blood sugar at home helps to keep it from getting very high or very low.  Writing the results in a diary or log helps the doctor know how to care for you.  Your blood sugar log should have the time, date and the results.  Also, write down the amount of insulin or other medicine that you take.  Other information, like what you ate, exercise done and how you were feeling, will also be helpful.     Notes:        Depression Screen    01/06/2023   11:58 AM 11/02/2022    3:16 PM 07/28/2022    3:14 PM 05/04/2022    3:36 PM 02/19/2022    2:21 PM 12/24/2021    4:01 PM 12/24/2021    3:33 PM  PHQ 2/9 Scores  PHQ - 2 Score 2 2 0 0 3 0 0  PHQ- 9 Score 4 2 0 0 8 0 0    Fall Risk    01/06/2023   11:56 AM 11/02/2022    3:11 PM 05/04/2022    3:35 PM 02/19/2022    2:20 PM 12/24/2021    4:02 PM  Fall Risk   Falls in the past year? 0 0 0 0 0  Number falls in past yr: 0 0 0 0 0  Injury with Fall? 0 0 0 0 0  Risk for fall due to : No Fall Risks   Impaired balance/gait;Impaired mobility No Fall Risks  Follow up Education provided;Falls prevention discussed   Falls prevention discussed Falls prevention discussed    FALL RISK PREVENTION PERTAINING TO THE HOME:  Any stairs in or around the home? Yes dont use steps/ramp If so, are there any without handrails? Yes  Home free of loose throw rugs in walkways, pet beds, electrical cords, etc? No  Adequate lighting in your home to reduce risk of falls? Yes   ASSISTIVE DEVICES UTILIZED TO  PREVENT FALLS:  Life alert? No  Use of a cane, walker or w/c? Yes  Grab bars in the bathroom? No  Shower chair or bench in shower? No  Elevated toilet seat or a handicapped toilet? Yes   Cognitive Function:        01/06/2023   12:04 PM 12/04/2019    2:30 PM 07/29/2017    9:42 AM  6CIT Screen  What Year? 0 points 0 points 0 points  What month? 0 points 0 points 0 points  What time? 0 points 0 points 0 points  Count back from 20 0 points 0 points 0 points  Months in reverse 0 points 0 points 0 points  Repeat phrase 0 points 0 points 4 points  Total Score 0 points 0 points 4 points    Immunizations Immunization History  Administered Date(s) Administered   Fluad Quad(high Dose 65+) 05/30/2019, 06/23/2020, 05/04/2022  Influenza, High Dose Seasonal PF 06/12/2014, 06/09/2015, 07/01/2016, 04/29/2017, 06/28/2018, 05/11/2021   Influenza,inj,Quad PF,6+ Mos 07/23/2013   PFIZER(Purple Top)SARS-COV-2 Vaccination 09/29/2019, 10/20/2019   PNEUMOCOCCAL CONJUGATE-20 11/02/2022   Pneumococcal Conjugate-13 08/13/2014   Pneumococcal Polysaccharide-23 07/29/2017   Tdap 06/23/2020    TDAP status: Up to date  Flu Vaccine status: Up to date  Pneumococcal vaccine status: Up to date  Covid-19 vaccine status: Completed vaccines  Qualifies for Shingles Vaccine? Yes   Zostavax completed No   Shingrix Completed?: No.    Education has been provided regarding the importance of this vaccine. Patient has been advised to call insurance company to determine out of pocket expense if they have not yet received this vaccine. Advised [place] also receive vaccine at local pharmacy or Health Dept. Verbalized acceptance and understanding.  Screening Tests Health Maintenance  Topic Date Due   COVID-19 Vaccine (3 - Pfizer risk series) 11/17/2019   OPHTHALMOLOGY EXAM  06/22/2022   Zoster Vaccines- Shingrix (1 of 2) 02/02/2023 (Originally 12/30/1959)   Diabetic kidney evaluation - eGFR measurement  02/20/2023    Diabetic kidney evaluation - Urine ACR  02/20/2023   INFLUENZA VACCINE  03/24/2023   FOOT EXAM  05/05/2023   HEMOGLOBIN A1C  05/05/2023   Medicare Annual Wellness (AWV)  01/06/2024   DTaP/Tdap/Td (2 - Td or Tdap) 06/23/2030   Pneumonia Vaccine 57+ Years old  Completed   DEXA SCAN  Completed   HPV VACCINES  Aged Out    Health Maintenance  Health Maintenance Due  Topic Date Due   COVID-19 Vaccine (3 - Pfizer risk series) 11/17/2019   OPHTHALMOLOGY EXAM  06/22/2022    Colorectal cancer screening: No longer required.   Mammogram status: No longer required due to age.  Bone Density status: Completed yes. Results reflect: Bone density results: OSTEOPOROSIS. Repeat every 3 years.  Lung Cancer Screening: (Low Dose CT Chest recommended if Age 74-80 years, 30 pack-year currently smoking OR have quit w/in 15years.) does not qualify.   Lung Cancer Screening Referral: no  Additional Screening:  Hepatitis C Screening: does not qualify; Completed yes  Vision Screening: Recommended annual ophthalmology exams for early detection of glaucoma and other disorders of the eye. Is the patient up to date with their annual eye exam?  Yes  Who is the provider or what is the name of the office in which the patient attends annual eye exams? Dr Alvester Morin If pt is not established with a provider, would they like to be referred to a provider to establish care? No .   Dental Screening: Recommended annual dental exams for proper oral hygiene  Community Resource Referral / Chronic Care Management: CRR required this visit?  No   CCM required this visit?  No     Plan:     I have personally reviewed and noted the following in the patient's chart:   Medical and social history Use of alcohol, tobacco or illicit drugs  Current medications and supplements including opioid prescriptions. Patient is not currently taking opioid prescriptions. Functional ability and status Nutritional status Physical  activity Advanced directives List of other physicians Hospitalizations, surgeries, and ER visits in previous 12 months Vitals Screenings to include cognitive, depression, and falls Referrals and appointments  In addition, I have reviewed and discussed with patient certain preventive protocols, quality metrics, and best practice recommendations. A written personalized care plan for preventive services as well as general preventive health recommendations were provided to patient.     Sue Lush, LPN  01/06/2023   Nurse Notes: Pt  says she is doing well other than being frustrated by limitations in doing for herself due to back pain. She relays as long as she sits she is alright but movement causes very bad pain (after getting around). Pt wants to know if there are any things she can do to help. She says she will try a back brace and see if that helps when standing. Otherwise pt has no other concerns or questions at this time.

## 2023-01-07 ENCOUNTER — Ambulatory Visit: Payer: PPO | Admitting: Podiatry

## 2023-02-02 ENCOUNTER — Telehealth: Payer: PPO

## 2023-02-02 ENCOUNTER — Ambulatory Visit: Payer: PPO

## 2023-02-02 DIAGNOSIS — Z794 Long term (current) use of insulin: Secondary | ICD-10-CM

## 2023-02-02 DIAGNOSIS — I10 Essential (primary) hypertension: Secondary | ICD-10-CM

## 2023-02-02 NOTE — Progress Notes (Signed)
Care Management & Coordination Services Pharmacy Note  02/02/2023 Name:  Lindsey [name] MRN:  [id number] DOB:  May 06, 1941  Summary: Patient presents for follow-up consult.   Patient has severe gaps in her fill history, which indicates potential non-compliance with her medications. She states she takes all of her medications daily, but most of the pill bottles she had on hand during our phone call were dated from 2023 or 2022. Stressed the importance of adherence to her medication regimen and encouraged her to start using a pillbox to keep her medications organized.   Patient reports severe back pain that is highly limiting. She is going to schedule a follow-up with her orthopedic doctor.   Recommendations/Changes made from today's visit: Continue current medications   Follow up plan: CPP follow-up 6 months   Patient-Specific Goals:  Subjective: Lindsey [name] is an 82 y.o. year old female who is a primary patient of Margarita Mail, DO.  The care coordination team was consulted for assistance with disease management and care coordination needs.    Engaged with patient by telephone for follow up visit.  Recent office visits: 11/02/22: Patient presented to Dr. Caralee Ates for follow-up.   Recent consult visits: None ID  Hospital visits: None in previous 6 months   Objective:  Lab Results  Component Value Date   CREATININE 0.91 02/19/2022   BUN 28 (H) 02/19/2022   EGFR 63 02/19/2022   GFRNONAA 65 01/06/2021   GFRAA 76 01/06/2021   NA 144 02/19/2022   K 4.8 02/19/2022   CALCIUM 9.5 02/19/2022   CO2 29 02/19/2022   GLUCOSE 121 (H) 02/19/2022    Lab Results  Component Value Date/Time   HGBA1C 7.4 (A) 11/02/2022 03:25 PM   HGBA1C 7.4 (A) 05/04/2022 03:41 PM   HGBA1C 7.6 (H) 11/06/2021 04:10 PM   HGBA1C 11.8 (H) 12/19/2019 11:34 AM   MICROALBUR 1.5 02/19/2022 03:11 PM   MICROALBUR 8.0 01/06/2021 11:31 AM   MICROALBUR NEG 07/01/2016 10:10 AM    Last diabetic Eye exam:   Lab Results  Component Value Date/Time   HMDIABEYEEXA Retinopathy (A) 06/22/2021 12:00 AM    Last diabetic Foot exam: No results found for: "HMDIABFOOTEX"   Lab Results  Component Value Date   CHOL 143 02/19/2022   HDL 41 (L) 02/19/2022   LDLCALC 81 02/19/2022   TRIG 115 02/19/2022   CHOLHDL 3.5 02/19/2022       Latest Ref Rng & Units 02/19/2022    3:11 PM 01/06/2021   11:31 AM 12/19/2019   11:34 AM  Hepatic Function  Total Protein 6.1 - 8.1 g/dL 6.8  7.4  6.8   AST 10 - 35 U/L 18  17  16    ALT 6 - 29 U/L 17  18  17    Total Bilirubin 0.2 - 1.2 mg/dL 0.9  0.9  0.8     Lab Results  Component Value Date/Time   TSH 0.86 02/19/2022 03:11 PM   TSH 3.59 01/06/2021 11:31 AM       Latest Ref Rng & Units 02/19/2022    3:11 PM 01/06/2021   11:31 AM 12/19/2020   10:20 AM  CBC  WBC 3.8 - 10.8 Thousand/uL 5.4  7.2  4.6   Hemoglobin 11.7 - 15.5 g/dL 40.9  81.1  91.4   Hematocrit 35.0 - 45.0 % 41.0  45.8  40.7   Platelets 140 - 400 Thousand/uL 159  233  208     Lab Results  Component Value Date/Time  VD25OH 40 11/24/2017 10:57 AM   VD25OH 42 09/30/2016 10:07 AM   VITAMINB12 1,376 (H) 02/19/2022 03:11 PM   VITAMINB12 541 01/06/2021 11:31 AM    Clinical ASCVD: No  The ASCVD Risk score (Arnett DK, et al., 2019) failed to calculate for the following reasons:   The 2019 ASCVD risk score is only valid for ages 39 to 64       01/06/2023   11:58 AM 11/02/2022    3:16 PM 07/28/2022    3:14 PM  Depression screen PHQ 2/9  Decreased Interest 1 1 0  Down, Depressed, Hopeless 1 1 0  PHQ - 2 Score 2 2 0  Altered sleeping 2 0 0  Tired, decreased energy 0 0   Change in appetite 0 0 0  Feeling bad or failure about yourself  0 0 0  Trouble concentrating 0 0 0  Moving slowly or fidgety/restless 0 0 0  Suicidal thoughts 0 0 0  PHQ-9 Score 4 2 0  Difficult doing work/chores Somewhat difficult Not difficult at all      Social History   Tobacco Use  Smoking Status Never  Smokeless  Tobacco Never  Tobacco Comments   Non-smoker. Smoking cessation materials contraindicated   BP Readings from Last 3 Encounters:  11/02/22 136/80  05/04/22 116/78  02/19/22 128/78   Pulse Readings from Last 3 Encounters:  11/02/22 68  05/04/22 73  02/19/22 83   Wt Readings from Last 3 Encounters:  01/06/23 182 lb (82.6 kg)  11/02/22 182 lb 6.4 oz (82.7 kg)  05/04/22 179 lb 3.2 oz (81.3 kg)   BMI Readings from Last 3 Encounters:  01/06/23 34.39 kg/m  11/02/22 34.46 kg/m  05/04/22 33.86 kg/m    No Known Allergies  Medications Reviewed Today     Reviewed by Sue Lush, LPN (Licensed Practical Nurse) on 01/06/23 at 1157  Med List Status: <None>   Medication Order Taking? Sig Documenting Provider Last Dose Status Informant  amLODipine (NORVASC) 2.5 MG tablet [id number]  Take 1 tablet (2.5 mg total) by mouth daily. Margarita Mail, DO  Active   atorvastatin (LIPITOR) 40 MG tablet [id number]  Take 1 tablet (40 mg total) by mouth daily. Margarita Mail, DO  Active   blood glucose meter kit and supplies [id number] No Dispense based on patient and insurance preference. Use up to four times daily as directed. (FOR ICD-10 E10.9, E11.9). Alba Cory, MD Taking Active   celecoxib (CELEBREX) 100 MG capsule [id number]  Take 1 capsule (100 mg total) by mouth daily as needed for moderate pain. Margarita Mail, DO  Active   cholecalciferol (VITAMIN D) 1000 UNITS tablet [id number] No Take by mouth. [provider] Taking Active            Med Note Mady Gemma, Rosanne Sack D   Tue Dec 04, 2019  2:26 PM)    glucose blood [facility] ULTRA) test strip [id number]  USE TO TEST FOUR TIMES DAILY Margarita Mail, DO  Active   hydrocortisone cream 1 % [id number] No Apply 1 application. topically 2 (two) times daily. Margarita Mail, DO Taking Active   insulin degludec (TRESIBA FLEXTOUCH) 100 UNIT/ML FlexTouch Pen [id number]  Inject 10 Units into the skin daily. Margarita Mail, DO   Active   Insulin Pen Needle (B-D UF III MINI PEN NEEDLES) 31G X 5 MM MISC [id number]  USE AS DIRECTED BY DISCARD REMAINDER TO INJECT INSULIN Margarita Mail, DO  Active   ketoconazole (NIZORAL) 2 % cream [id number] No Apply topically  2 (two) times daily. Alba Cory, MD Taking Active   Lancets [facility] DELICA PLUS [place]) MISC [id number] No USE TO TEST UP TO FOUR TIMES A DAY AS DIRECTED Margarita Mail, DO Taking Active   levothyroxine (SYNTHROID) 112 MCG tablet [id number]  Take 1 tablet (112 mcg total) by mouth daily before breakfast. Margarita Mail, DO  Active   lisinopril (ZESTRIL) 40 MG tablet [id number]  Take 1 tablet (40 mg total) by mouth at bedtime. Margarita Mail, DO  Active   sertraline (ZOLOFT) 50 MG tablet [id number]  Take 1 tablet (50 mg total) by mouth daily. Margarita Mail, DO  Active             SDOH:  (Social Determinants of Health) assessments and interventions performed: Yes SDOH Interventions    Flowsheet Row Clinical Support from 01/06/2023 in [facility] Office Visit from 02/19/2022 in [facility] Chronic Care Management from 01/27/2022 in [facility] Clinical Support from 12/24/2021 in [facility] Chronic Care Management from 08/05/2021 in [facility] Office Visit from 03/11/2021 in [facility]  SDOH Interventions        Food Insecurity Interventions Intervention Not Indicated -- -- Intervention Not Indicated -- --  Housing Interventions Intervention Not Indicated -- -- Intervention Not Indicated -- --  Transportation Interventions Intervention Not Indicated -- -- Intervention Not Indicated -- --  Utilities Interventions Intervention Not Indicated -- -- -- -- --  Alcohol Usage Interventions Intervention Not Indicated (Score <7) -- -- -- -- --  Depression Interventions/Treatment  -- Currently on  Treatment -- -- -- Currently on Treatment  Financial Strain Interventions Intervention Not Indicated -- Intervention Not Indicated Intervention Not Indicated Intervention Not Indicated --  Physical Activity Interventions Intervention Not Indicated -- -- Intervention Not Indicated -- --  Stress Interventions Intervention Not Indicated -- -- Intervention Not Indicated -- --  Social Connections Interventions Intervention Not Indicated -- -- Intervention Not Indicated -- --       Medication Assistance: None required.  Patient affirms current coverage meets needs.  Assessment/Plan  Hypertension (BP goal <140/90) -Uncontrolled -Current treatment: Amlodipine 2.5 mg nightly Lisinopril 40 mg daily  -Medications previously tried: NA  -Current home readings: does not monitor at home.  -Denies hypotensive/hypertensive symptoms -Counseled to monitor BP at home weekly, document, and provide log at future appointments -Recommended to continue current medication  Hyperlipidemia: (LDL goal < 70) -Controlled -Current treatment: Atorvastatin 40 mg daily  -Medications previously tried: NA  -Recommended to continue current medication  Diabetes (A1c goal <8%) -Controlled -Current medications: Tresiba 10 units daily  -Medications previously tried: Metformin Januvia,Glipizide   -Current home glucose readings Before Supper: 130-221  -Denies hypoglycemic/hyperglycemic symptoms -Current dietary patterns: Eating milk shakes and soup.  -Recommended to continue current medication  Osteoporosis / Osteopenia (Goal Prevent bone fractures) -Controlled -Patient is a candidate for pharmacologic treatment due to T-Score < -2.5 in femoral neck, T-Score < -2.5 in total hip , and T-Score < -2.5 in lumbar spine -Current treatment  Calcium + D 600-800 daily  -Medications previously tried: Alendronate (ineffective), Prolia (cost)  -Recommended to continue current medication  Depression/Anxiety (Goal: Maintain  stable mood) -Uncontrolled -Current treatment: Sertraline 50 mg daily  -Medications previously tried/failed: NA -PHQ9: 0 -Recommended to continue current medication  Hypothyroidism (Goal: Maintain stable thyroid function) -Controlled -Current treatment  Levothyroxine 112 mcg daily -Medications previously tried: NA  -Recommended to continue current medication  Malva Limes, [place] Pharmacist Practitioner  [facility] [phone]

## 2023-02-23 DIAGNOSIS — M545 Low back pain, unspecified: Secondary | ICD-10-CM | POA: Diagnosis not present

## 2023-03-02 ENCOUNTER — Other Ambulatory Visit: Payer: Self-pay | Admitting: Internal Medicine

## 2023-03-02 DIAGNOSIS — E1129 Type 2 diabetes mellitus with other diabetic kidney complication: Secondary | ICD-10-CM

## 2023-03-03 NOTE — Telephone Encounter (Signed)
Requested Prescriptions  Pending Prescriptions Disp Refills   Insulin Pen Needle (B-D UF III MINI PEN NEEDLES) 31G X 5 MM MISC [Pharmacy Med Name: B-D PEN NDL MINI 31GX5MM(3/16)PRPL] 100 each 0    Sig: USE TO INJECT INSULIN     Endocrinology: Diabetes - Testing Supplies Passed - 03/02/2023  2:05 PM      Passed - Valid encounter within last 12 months    Recent Outpatient Visits           4 months ago Type 2 diabetes mellitus with microalbuminuria, with long-term current use of insulin [facility])   [facility] Margarita Mail, DO   10 months ago Controlled type 2 diabetes mellitus with microalbuminuria, with long-term current use of insulin [facility])   [place] [facility] Margarita Mail, DO   1 year ago Hyperlipidemia associated with type 2 diabetes mellitus [facility])   [place] [facility] Alba Cory, MD   1 year ago Tick bite of left lower leg, subsequent encounter   [facility] Margarita Mail, DO   1 year ago Tick bite of left lower leg, subsequent encounter   [facility] Margarita Mail, DO       Future Appointments             In 2 months Margarita Mail, DO [facility]

## 2023-04-11 ENCOUNTER — Encounter: Payer: Self-pay | Admitting: Pharmacist

## 2023-04-11 NOTE — Progress Notes (Signed)
   04/11/2023  Patient ID: Lindsey [name], female   DOB: 1941/01/22, 82 y.o.   MRN: [id number]  Pharmacy Quality Measure Review  This patient is appearing on a report for adherence measure for cholesterol (statin) medications this calendar year.   Medication: atorvastatin 40 mg Last fill date: 03/29/2023 for 90 day supply  [employer] report was not up to date. No action needed at this time.   Estelle Grumbles, PharmD, [facility] [phone]

## 2023-05-04 ENCOUNTER — Telehealth: Payer: Self-pay | Admitting: *Deleted

## 2023-05-04 NOTE — Progress Notes (Signed)
Established Patient Office Visit  Subjective   Patient ID: Lindsey [name], female    DOB: 10/13/1940  Age: 82 y.o. MRN: [id number]  Chief Complaint  Patient presents with   Follow-up    HPI  Patient is here to follow up on chronic medical conditions.   Hypertension: -Medications: Lisinopril 40 mg, Amlodipine 2.5 mg  -Patient is compliant with above medications and reports no side effects. -Checking BP at home (average): not checking  -Denies any SOB, CP, vision changes, LE edema or symptoms of hypotension  HLD: -Medications: Lipitor 40 mg -Patient is compliant with above medications and reports no side effects.  -Last lipid panel: Lipid Panel     Component Value Date/Time   CHOL 143 02/19/2022 1511   CHOL 151 12/29/2015 0954   TRIG 115 02/19/2022 1511   HDL 41 (L) 02/19/2022 1511   HDL 53 12/29/2015 0954   CHOLHDL 3.5 02/19/2022 1511   VLDL 16 01/27/2017 1128   LDLCALC 81 02/19/2022 1511   LABVLDL 24 12/29/2015 0954   Diabetes, Type 2: -Last A1c 3/24 7.4% -Medications: Tresiba 10 units -Patient is compliant with the above medications and reports no side effects.  -Checking BG at home: Unable to check her blood sugar on her own, usually her granddaughter does it but they have not been checking recently.  Would like to try a continuous glucose monitor -Eye exam: UTD 4/24 -Foot exam: Due  -Microalbumin: Due  -Statin: yes -PNA vaccine: UTD -Denies symptoms of hypoglycemia, polyuria, polydipsia, numbness extremities, foot ulcers/trauma.   Hypothyroidism: -Medications: Levothyroxine 112 mcg -Patient is compliant with the above medication (s) at the above dose and reports no medication side effects.  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, stable   MDD: -Mood status: stable -Current treatment: Zoloft 50 mg   -Satisfied with current treatment?: yes -Duration of current treatment : chronic -Side effects: no Medication compliance: excellent compliance     05/06/2023   10:07 AM 01/06/2023   11:58 AM 11/02/2022    3:16 PM 07/28/2022    3:14 PM 05/04/2022    3:36 PM  Depression screen PHQ 2/9  Decreased Interest 0 1 1 0 0  Down, Depressed, Hopeless 1 1 1  0 0  PHQ - 2 Score 1 2 2  0 0  Altered sleeping 1 2 0 0 0  Tired, decreased energy 2 0 0  0  Change in appetite 0 0 0 0 0  Feeling bad or failure about yourself  1 0 0 0 0  Trouble concentrating 0 0 0 0 0  Moving slowly or fidgety/restless 0 0 0 0 0  Suicidal thoughts 0 0 0 0 0  PHQ-9 Score 5 4 2  0 0  Difficult doing work/chores Not difficult at all Somewhat difficult Not difficult at all  Not difficult at all   Arthritis: -Has pain in her legs, hands and back.  Is taking Tylenol as needed but it is not helping. -Had prescribed Celebrex 100 mg daily back in the spring, patient took it at that time and it did help her pain, however she did not request any refills of it.  Review of Systems  Constitutional:  Negative for chills and fever.  Eyes:  Negative for blurred vision.  Respiratory:  Negative for shortness of breath.   Cardiovascular:  Negative for chest pain.  Musculoskeletal:  Positive for back pain.      Objective:     BP 120/70   Pulse 79   Temp 98 F (36.7 C)   Ht 5' 1.2" (1.554 m)   Wt 183 lb 4.8 oz (83.1 kg)   SpO2 90%   BMI 34.41 kg/m  BP Readings from Last 3 Encounters:  05/06/23 120/70  11/02/22 136/80  05/04/22 116/78   Wt Readings from Last 3 Encounters:  05/06/23 183 lb 4.8 oz (83.1 kg)  01/06/23 182 lb (82.6 kg)  11/02/22 182 lb 6.4 oz (82.7 kg)      Physical Exam Constitutional:      Appearance: Normal appearance.  HENT:     Head: Normocephalic and atraumatic.  Eyes:     Conjunctiva/sclera: Conjunctivae normal.  Cardiovascular:     Rate and Rhythm: Normal rate and regular rhythm.     Pulses:           Dorsalis pedis pulses are 2+ on the right side and 2+ on the left side.  Pulmonary:     Effort: Pulmonary effort is normal.     Breath sounds: Normal breath sounds.  Musculoskeletal:     Right foot: Normal range of motion. No deformity, bunion, Charcot foot, foot drop or prominent metatarsal heads.     Left foot: Normal range of motion. No deformity, bunion, Charcot foot, foot drop or prominent metatarsal heads.  Feet:     Right foot:     Protective Sensation: 6 sites tested.  5 sites sensed.     Skin integrity: Skin integrity normal.     Toenail Condition: Right toenails are long.     Left foot:     Protective Sensation: 6 sites tested.  6 sites sensed.     Skin integrity: Skin integrity normal.     Toenail Condition: Left toenails are long.  Skin:    General: Skin is warm and dry.  Neurological:     General: No focal deficit present.     Mental Status: She is alert. Mental status is at baseline.  Psychiatric:        Mood and Affect: Mood normal.        Behavior: Behavior normal.      No results found for any visits on 05/06/23.   Last CBC Lab Results  Component Value Date   WBC 5.4 02/19/2022   HGB 13.6 02/19/2022   HCT 41.0 02/19/2022   MCV 89.3 02/19/2022   MCH 29.6 02/19/2022   RDW 13.5 02/19/2022   PLT 159 02/19/2022   Last metabolic panel Lab Results  Component Value Date   GLUCOSE 121 (H) 02/19/2022   NA 144 02/19/2022   K 4.8 02/19/2022   CL 108 02/19/2022   CO2 29 02/19/2022   BUN 28 (H) 02/19/2022   CREATININE 0.91 02/19/2022   EGFR 63 02/19/2022   CALCIUM 9.5 02/19/2022   PHOS 3.9 03/31/2020   PROT 6.8 02/19/2022   ALBUMIN 4.4 09/30/2016   LABGLOB 2.6 01/16/2016   AGRATIO 1.7 01/16/2016   BILITOT 0.9 02/19/2022   ALKPHOS 50 09/30/2016   AST 18 02/19/2022   ALT 17 02/19/2022   ANIONGAP 10 09/12/2020   Last lipids Lab Results  Component Value Date   CHOL 143 02/19/2022   HDL 41 (L) 02/19/2022   LDLCALC 81 02/19/2022   TRIG 115  02/19/2022   CHOLHDL 3.5 02/19/2022   Last hemoglobin A1c Lab Results  Component Value Date   HGBA1C 7.4 (A) 11/02/2022  Last thyroid functions Lab Results  Component Value Date   TSH 0.86 02/19/2022   Last vitamin D Lab Results  Component Value Date   VD25OH 40 11/24/2017   Last vitamin B12 and Folate Lab Results  Component Value Date   VITAMINB12 1,376 (H) 02/19/2022   FOLATE >24.0 02/19/2022      The ASCVD Risk score (Arnett DK, et al., 2019) failed to calculate for the following reasons:   The 2019 ASCVD risk score is only valid for ages 33 to 65    Assessment & Plan:   1. Benign hypertension: Blood pressure well-controlled.  Continue lisinopril 40 mg, amlodipine 2.5 mg, sent to pharmacy.  Annual labs due today.  - CBC w/Diff/Platelet - COMPLETE METABOLIC PANEL WITH GFR - amLODipine (NORVASC) 2.5 MG tablet; Take 1 tablet (2.5 mg total) by mouth daily.  Dispense: 90 tablet; Refill: 1 - lisinopril (ZESTRIL) 40 MG tablet; Take 1 tablet (40 mg total) by mouth at bedtime.  Dispense: 90 tablet; Refill: 1  2. Dyslipidemia associated with type 2 diabetes mellitus (HCC): Recheck lipid panel, refill Lipitor 40 mg.  - Lipid Profile - atorvastatin (LIPITOR) 40 MG tablet; Take 1 tablet (40 mg total) by mouth daily.  Dispense: 90 tablet; Refill: 1  3. Type 2 diabetes mellitus with microalbuminuria, with long-term current use of insulin (HCC): Recheck A1c today.  Foot exam and microalbumin today as well.  Will prescribe freestyle libre 2 reader and sensors, as patient does not have a smart phone.  Continue Tresiba 10 units daily.  - HgB A1c - Urine Microalbumin w/creat. ratio - Continuous Glucose Receiver (FREESTYLE LIBRE 2 READER) DEVI; 1 each by Does not apply route every 14 (fourteen) days.  Dispense: 1 each; Refill: 0 - Continuous Glucose Sensor (FREESTYLE LIBRE 2 SENSOR) MISC; 1 each by Does not apply route every 14 (fourteen) days.  Dispense: 2 each; Refill: 6 - HM  Diabetes Foot Exam - insulin degludec (TRESIBA FLEXTOUCH) 100 UNIT/ML FlexTouch Pen; Inject 10 Units into the skin daily.  Dispense: 18 mL; Refill: 0 - Insulin Pen Needle (B-D UF III MINI PEN NEEDLES) 31G X 5 MM MISC; USE TO INJECT INSULIN  Dispense: 100 each; Refill: 0  4. Adult hypothyroidism: Recheck thyroid.  Refill levothyroxine 112 mcg.  - TSH - levothyroxine (SYNTHROID) 112 MCG tablet; Take 1 tablet (112 mcg total) by mouth daily before breakfast.  Dispense: 90 tablet; Refill: 1  5. Major depression, recurrent, chronic (HCC): Mood stable, refill Zoloft 50 mg.  - sertraline (ZOLOFT) 50 MG tablet; Take 1 tablet (50 mg total) by mouth daily.  Dispense: 90 tablet; Refill: 1  6. Arthritis: Will prescribe Celebrex 100 mg to take daily as needed for pain.  Patient counseled to take with food.  Will recheck kidney function today and make any changes if necessary..  - celecoxib (CELEBREX) 100 MG capsule; Take 1 capsule (100 mg total) by mouth daily as needed for moderate pain.  Dispense: 90 capsule; Refill: 1  7. Low serum vitamin B12: Recheck vitamin B12.  - Vitamin B12   Return in about 6 months (around 11/03/2023).    Margarita Mail, DO

## 2023-05-04 NOTE — Patient Outreach (Signed)
  Care Coordination   05/04/2023 Name: Lindsey [name] MRN: [id number] DOB: Garson 31, 1942   Care Coordination Outreach Attempts:  An unsuccessful telephone outreach was attempted today to offer the patient information about available care coordination services.  Follow Up Plan:  Additional outreach attempts will be made to offer the patient care coordination information and services.   Encounter Outcome:  No Answer   Care Coordination Interventions:  No, not indicated    Kebra Lowrimore, LCSW [place]  [facility], [facility] Licensed Clinical Social Worker Care Coordinator  Direct Dial: [phone]

## 2023-05-06 ENCOUNTER — Ambulatory Visit (INDEPENDENT_AMBULATORY_CARE_PROVIDER_SITE_OTHER): Payer: PPO | Admitting: Internal Medicine

## 2023-05-06 ENCOUNTER — Encounter: Payer: Self-pay | Admitting: Internal Medicine

## 2023-05-06 VITALS — BP 120/70 | HR 79 | Temp 98.0°F | Ht 61.2 in | Wt 183.3 lb

## 2023-05-06 DIAGNOSIS — E039 Hypothyroidism, unspecified: Secondary | ICD-10-CM | POA: Diagnosis not present

## 2023-05-06 DIAGNOSIS — E538 Deficiency of other specified B group vitamins: Secondary | ICD-10-CM

## 2023-05-06 DIAGNOSIS — F339 Major depressive disorder, recurrent, unspecified: Secondary | ICD-10-CM | POA: Diagnosis not present

## 2023-05-06 DIAGNOSIS — E1169 Type 2 diabetes mellitus with other specified complication: Secondary | ICD-10-CM | POA: Diagnosis not present

## 2023-05-06 DIAGNOSIS — I10 Essential (primary) hypertension: Secondary | ICD-10-CM | POA: Diagnosis not present

## 2023-05-06 DIAGNOSIS — E785 Hyperlipidemia, unspecified: Secondary | ICD-10-CM

## 2023-05-06 DIAGNOSIS — M1990 Unspecified osteoarthritis, unspecified site: Secondary | ICD-10-CM

## 2023-05-06 DIAGNOSIS — R809 Proteinuria, unspecified: Secondary | ICD-10-CM

## 2023-05-06 DIAGNOSIS — E1129 Type 2 diabetes mellitus with other diabetic kidney complication: Secondary | ICD-10-CM

## 2023-05-06 DIAGNOSIS — Z794 Long term (current) use of insulin: Secondary | ICD-10-CM

## 2023-05-06 MED ORDER — LISINOPRIL 40 MG PO TABS
40.0000 mg | ORAL_TABLET | Freq: Every day | ORAL | 1 refills | Status: DC
Start: 2023-05-06 — End: 2023-11-03

## 2023-05-06 MED ORDER — LEVOTHYROXINE SODIUM 112 MCG PO TABS
112.0000 ug | ORAL_TABLET | Freq: Every day | ORAL | 1 refills | Status: DC
Start: 2023-05-06 — End: 2023-11-03

## 2023-05-06 MED ORDER — ATORVASTATIN CALCIUM 40 MG PO TABS
40.0000 mg | ORAL_TABLET | Freq: Every day | ORAL | 1 refills | Status: DC
Start: 2023-05-06 — End: 2023-11-03

## 2023-05-06 MED ORDER — TRESIBA FLEXTOUCH 100 UNIT/ML ~~LOC~~ SOPN
10.0000 [IU] | PEN_INJECTOR | Freq: Every day | SUBCUTANEOUS | 0 refills | Status: DC
Start: 2023-05-06 — End: 2023-11-03

## 2023-05-06 MED ORDER — AMLODIPINE BESYLATE 2.5 MG PO TABS
2.5000 mg | ORAL_TABLET | Freq: Every day | ORAL | 1 refills | Status: DC
Start: 2023-05-06 — End: 2024-05-11

## 2023-05-06 MED ORDER — SERTRALINE HCL 50 MG PO TABS
50.0000 mg | ORAL_TABLET | Freq: Every day | ORAL | 1 refills | Status: DC
Start: 2023-05-06 — End: 2023-11-03

## 2023-05-06 MED ORDER — BD PEN NEEDLE MINI U/F 31G X 5 MM MISC
0 refills | Status: DC
Start: 2023-05-06 — End: 2023-10-31

## 2023-05-06 MED ORDER — FREESTYLE LIBRE 2 SENSOR MISC
1.0000 | 6 refills | Status: DC
Start: 2023-05-06 — End: 2023-11-03

## 2023-05-06 MED ORDER — FREESTYLE LIBRE 2 READER DEVI
1.0000 | 0 refills | Status: DC
Start: 2023-05-06 — End: 2023-11-03

## 2023-05-06 MED ORDER — CELECOXIB 100 MG PO CAPS
100.0000 mg | ORAL_CAPSULE | Freq: Every day | ORAL | 1 refills | Status: DC | PRN
Start: 2023-05-06 — End: 2023-11-03

## 2023-05-06 NOTE — Patient Instructions (Signed)
It was great seeing you today!  Plan discussed at today's visit: -Blood work ordered today, results will be uploaded to MyChart.  -Free Style Libre 2 ordered to help check sugars -Flu vaccine today -Please call [facility] here [phone] to be scheduled in [place] -Medications refilled   Follow up in: 6 months   Take care and let us know if you have any questions or concerns prior to your next visit.  Dr. Caralee Ates

## 2023-05-07 LAB — CBC WITH DIFFERENTIAL/PLATELET
Absolute Monocytes: 410 {cells}/uL (ref 200–950)
Basophils Absolute: 40 {cells}/uL (ref 0–200)
Basophils Relative: 0.7 %
Eosinophils Absolute: 188 {cells}/uL (ref 15–500)
Eosinophils Relative: 3.3 %
HCT: 42.5 % (ref 35.0–45.0)
Hemoglobin: 13.9 g/dL (ref 11.7–15.5)
Lymphs Abs: 1653 {cells}/uL (ref 850–3900)
MCH: 29.4 pg (ref 27.0–33.0)
MCHC: 32.7 g/dL (ref 32.0–36.0)
MCV: 89.9 fL (ref 80.0–100.0)
MPV: 11.9 fL (ref 7.5–12.5)
Monocytes Relative: 7.2 %
Neutro Abs: 3409 {cells}/uL (ref 1500–7800)
Neutrophils Relative %: 59.8 %
Platelets: 164 10*3/uL (ref 140–400)
RBC: 4.73 10*6/uL (ref 3.80–5.10)
RDW: 13 % (ref 11.0–15.0)
Total Lymphocyte: 29 %
WBC: 5.7 10*3/uL (ref 3.8–10.8)

## 2023-05-07 LAB — COMPLETE METABOLIC PANEL WITH GFR
AG Ratio: 2 (calc) (ref 1.0–2.5)
ALT: 14 U/L (ref 6–29)
AST: 16 U/L (ref 10–35)
Albumin: 4.5 g/dL (ref 3.6–5.1)
Alkaline phosphatase (APISO): 56 U/L (ref 37–153)
BUN: 21 mg/dL (ref 7–25)
CO2: 30 mmol/L (ref 20–32)
Calcium: 9.5 mg/dL (ref 8.6–10.4)
Chloride: 105 mmol/L (ref 98–110)
Creat: 0.92 mg/dL (ref 0.60–0.95)
Globulin: 2.2 g/dL (ref 1.9–3.7)
Glucose, Bld: 163 mg/dL — ABNORMAL HIGH (ref 65–99)
Potassium: 4.2 mmol/L (ref 3.5–5.3)
Sodium: 142 mmol/L (ref 135–146)
Total Bilirubin: 1 mg/dL (ref 0.2–1.2)
Total Protein: 6.7 g/dL (ref 6.1–8.1)
eGFR: 62 mL/min/{1.73_m2} (ref >=60)

## 2023-05-07 LAB — MICROALBUMIN / CREATININE URINE RATIO
Creatinine, Urine: 147 mg/dL (ref 20–275)
Microalb Creat Ratio: 12 mg/g{creat} (ref <30)
Microalb, Ur: 1.7 mg/dL

## 2023-05-07 LAB — HEMOGLOBIN A1C
Hgb A1c MFr Bld: 7.8 %{Hb} — ABNORMAL HIGH (ref <5.7)
Mean Plasma Glucose: 177 mg/dL
eAG (mmol/L): 9.8 mmol/L

## 2023-05-07 LAB — LIPID PANEL
Cholesterol: 149 mg/dL (ref <200)
HDL: 49 mg/dL — ABNORMAL LOW (ref >=50)
LDL Cholesterol (Calc): 79 mg/dL
Non-HDL Cholesterol (Calc): 100 mg/dL (ref <130)
Total CHOL/HDL Ratio: 3 (calc) (ref <5.0)
Triglycerides: 116 mg/dL (ref <150)

## 2023-05-07 LAB — VITAMIN B12: Vitamin B-12: 597 pg/mL (ref 200–1100)

## 2023-05-07 LAB — TSH: TSH: 0.95 m[IU]/L (ref 0.40–4.50)

## 2023-06-16 DIAGNOSIS — E113393 Type 2 diabetes mellitus with moderate nonproliferative diabetic retinopathy without macular edema, bilateral: Secondary | ICD-10-CM | POA: Diagnosis not present

## 2023-08-12 ENCOUNTER — Ambulatory Visit: Payer: PPO | Admitting: Podiatry

## 2023-08-26 ENCOUNTER — Ambulatory Visit: Payer: Self-pay

## 2023-08-26 NOTE — Telephone Encounter (Signed)
 Chief Complaint: Cough Symptoms: runny nose like water (clear), nasal congestion, mild headache sometimes, cough severity 1-4/10 Frequency: onset 08/23/23 Pertinent Negatives: Patient denies other symptoms Disposition: [] ED /[] Urgent Care (no appt availability in office) / [] Appointment(In office/virtual)/ []  [place] [facility]/ [x] Home Care/ [] Refused Recommended Disposition /[] [place] [facility]/ []  Follow-up with PCP Additional Notes: Patient advised I can schedule her for next week, she says she Cashatt be better by then. Home care advice given, advised to buy OTC loratadine to take for nasal drip and congestion, advised to call back on Monday or go to UC if symptoms don't improve or worsen over the weekend. She verbalized understanding.   Summary: Medication request   Pt's husband is calling in because pt is coughing, runny nose, and a stuffy head and wants to know if she can get some medicine called in.     Reason for Disposition  Cough with cold symptoms (e.g., runny nose, postnasal drip, throat clearing)  Answer Assessment - Initial Assessment Questions 1. ONSET: When did the cough begin?      08/23/23 2. SEVERITY: How bad is the cough today?      1-4 3. SPUTUM: Describe the color of your sputum (none, dry cough; clear, white, yellow, green)     Dry cough 4. HEMOPTYSIS: Are you coughing up any blood? If so ask: How much? (flecks, streaks, tablespoons, etc.)     No 5. DIFFICULTY BREATHING: Are you having difficulty breathing? If Yes, ask: How bad is it? (e.g., mild, moderate, severe)    - MILD: No SOB at rest, mild SOB with walking, speaks normally in sentences, can lie down, no retractions, pulse < 100.    - MODERATE: SOB at rest, SOB with minimal exertion and prefers to sit, cannot lie down flat, speaks in phrases, mild retractions, audible wheezing, pulse 100-120.    - SEVERE: Very SOB at rest, speaks in single words, struggling to breathe, sitting hunched  forward, retractions, pulse > 120      No 6. FEVER: Do you have a fever? If Yes, ask: What is your temperature, how was it measured, and when did it start?     No 7. CARDIAC HISTORY: Do you have any history of heart disease? (e.g., heart attack, congestive heart failure)      No 8. LUNG HISTORY: Do you have any history of lung disease?  (e.g., pulmonary embolus, asthma, emphysema)     No 9. OTHER SYMPTOMS: Do you have any other symptoms? (e.g., runny nose, wheezing, chest pain)       Runny nose clear water, sinus congestion, sometimes a mild headache, drainage to the back of throat  Protocols used: Cough - Acute Non-Productive-A-AH

## 2023-08-29 NOTE — Telephone Encounter (Signed)
 Called and spoke with Baldo Ash and he said that she was in the bed and that they have no way today . Is going to call us back.

## 2023-08-31 ENCOUNTER — Encounter: Payer: Self-pay | Admitting: Podiatry

## 2023-08-31 ENCOUNTER — Ambulatory Visit: Payer: PPO | Admitting: Podiatry

## 2023-08-31 VITALS — Ht 61.2 in | Wt 183.3 lb

## 2023-08-31 DIAGNOSIS — B351 Tinea unguium: Secondary | ICD-10-CM

## 2023-08-31 DIAGNOSIS — Z794 Long term (current) use of insulin: Secondary | ICD-10-CM

## 2023-08-31 DIAGNOSIS — E1129 Type 2 diabetes mellitus with other diabetic kidney complication: Secondary | ICD-10-CM | POA: Diagnosis not present

## 2023-08-31 DIAGNOSIS — M79674 Pain in right toe(s): Secondary | ICD-10-CM | POA: Diagnosis not present

## 2023-08-31 DIAGNOSIS — R809 Proteinuria, unspecified: Secondary | ICD-10-CM

## 2023-08-31 DIAGNOSIS — M79675 Pain in left toe(s): Secondary | ICD-10-CM | POA: Diagnosis not present

## 2023-08-31 NOTE — Progress Notes (Signed)

## 2023-10-29 ENCOUNTER — Other Ambulatory Visit: Payer: Self-pay | Admitting: Internal Medicine

## 2023-10-29 DIAGNOSIS — Z794 Long term (current) use of insulin: Secondary | ICD-10-CM

## 2023-10-31 NOTE — Telephone Encounter (Signed)
 Requested Prescriptions  Pending Prescriptions Disp Refills   B-D UF III MINI PEN NEEDLES 31G X 5 MM MISC [Pharmacy Med Name: B-D PEN NDL MINI 31GX5MM(3/16)PRPL] 100 each 0    Sig: USE TO INJECT INSULIN     Endocrinology: Diabetes - Testing Supplies Passed - 10/31/2023  4:14 PM      Passed - Valid encounter within last 12 months    Recent Outpatient Visits           5 months ago Benign hypertension   [facility] Margarita Mail, DO   12 months ago Type 2 diabetes mellitus with microalbuminuria, with long-term current use of insulin [facility])   [place] [facility] Margarita Mail, DO   1 year ago Controlled type 2 diabetes mellitus with microalbuminuria, with long-term current use of insulin [facility])   [place] [facility] Margarita Mail, DO   1 year ago Hyperlipidemia associated with type 2 diabetes mellitus [facility])   [facility] Alba Cory, MD   1 year ago Tick bite of left lower leg, subsequent encounter   [facility] Margarita Mail, DO       Future Appointments             In 3 days Margarita Mail, DO [facility], [facility]

## 2023-11-03 ENCOUNTER — Ambulatory Visit (INDEPENDENT_AMBULATORY_CARE_PROVIDER_SITE_OTHER): Payer: Self-pay | Admitting: Internal Medicine

## 2023-11-03 ENCOUNTER — Other Ambulatory Visit: Payer: Self-pay

## 2023-11-03 ENCOUNTER — Encounter: Payer: Self-pay | Admitting: Internal Medicine

## 2023-11-03 VITALS — BP 138/76 | HR 82 | Temp 98.0°F | Resp 16 | Ht 61.5 in | Wt 183.1 lb

## 2023-11-03 DIAGNOSIS — I10 Essential (primary) hypertension: Secondary | ICD-10-CM | POA: Diagnosis not present

## 2023-11-03 DIAGNOSIS — E1129 Type 2 diabetes mellitus with other diabetic kidney complication: Secondary | ICD-10-CM

## 2023-11-03 DIAGNOSIS — Z794 Long term (current) use of insulin: Secondary | ICD-10-CM | POA: Diagnosis not present

## 2023-11-03 DIAGNOSIS — R809 Proteinuria, unspecified: Secondary | ICD-10-CM | POA: Diagnosis not present

## 2023-11-03 DIAGNOSIS — R262 Difficulty in walking, not elsewhere classified: Secondary | ICD-10-CM | POA: Diagnosis not present

## 2023-11-03 DIAGNOSIS — E785 Hyperlipidemia, unspecified: Secondary | ICD-10-CM

## 2023-11-03 DIAGNOSIS — F339 Major depressive disorder, recurrent, unspecified: Secondary | ICD-10-CM | POA: Diagnosis not present

## 2023-11-03 DIAGNOSIS — E1169 Type 2 diabetes mellitus with other specified complication: Secondary | ICD-10-CM

## 2023-11-03 DIAGNOSIS — E039 Hypothyroidism, unspecified: Secondary | ICD-10-CM | POA: Diagnosis not present

## 2023-11-03 LAB — POCT GLYCOSYLATED HEMOGLOBIN (HGB A1C): Hemoglobin A1C: 7.5 % — AB (ref 4.0–5.6)

## 2023-11-03 MED ORDER — ATORVASTATIN CALCIUM 40 MG PO TABS
40.0000 mg | ORAL_TABLET | Freq: Every day | ORAL | 1 refills | Status: DC
Start: 2023-11-03 — End: 2024-05-11

## 2023-11-03 MED ORDER — SERTRALINE HCL 50 MG PO TABS: 50.0000 mg | ORAL_TABLET | Freq: Every day | ORAL | 1 refills | Status: DC

## 2023-11-03 MED ORDER — ONETOUCH DELICA PLUS LANCET30G MISC: 1 refills | Status: AC

## 2023-11-03 MED ORDER — LEVOTHYROXINE SODIUM 112 MCG PO TABS: 112.0000 ug | ORAL_TABLET | Freq: Every day | ORAL | 1 refills | Status: DC

## 2023-11-03 MED ORDER — TRESIBA FLEXTOUCH 100 UNIT/ML ~~LOC~~ SOPN: 10.0000 [IU] | PEN_INJECTOR | Freq: Every day | SUBCUTANEOUS | 0 refills | Status: DC

## 2023-11-03 MED ORDER — LISINOPRIL 40 MG PO TABS: 40.0000 mg | ORAL_TABLET | Freq: Every day | ORAL | 1 refills | Status: DC

## 2023-11-03 NOTE — Assessment & Plan Note (Signed)
 Moods controlled, continue and refill zoloft.

## 2023-11-03 NOTE — Assessment & Plan Note (Signed)
 Stable, refill statin.

## 2023-11-03 NOTE — Assessment & Plan Note (Signed)
 A1c good today at 7.5%, continue Tresiba 10 units.

## 2023-11-03 NOTE — Assessment & Plan Note (Signed)
 BP borderline but patient having symptoms of orthostatic hypotension, will continue Lisinopril 40 mg and hold Amlodipine at night.

## 2023-11-03 NOTE — Progress Notes (Signed)
 Established Patient Office Visit  Subjective   Patient ID: Lindsey [name], female    DOB: 1941-03-30  Age: 83 y.o. MRN: [id number]  Chief Complaint  Patient presents with   Medical Management of Chronic Issues    6 month recheck    HPI  Patient is here to follow up on chronic medical conditions. Patient states she is doing well overall with exception of feeling lightheaded when she stands up. This is worse at  night after taking her amlodipine. Denies falls. Does use a walker when outside of her house and a walking stick in her backyard but doesn't have anything to use in her house.   Hypertension: -Medications: Lisinopril 40 mg, Amlodipine 2.5 mg  -Patient is compliant with above medications and reports no side effects. -Checking BP at home (average): not checking  -Denies any SOB, CP, vision changes, LE edema   HLD: -Medications: Lipitor 40 mg -Patient is compliant with above medications and reports no side effects.  -Last lipid panel: Lipid Panel     Component Value Date/Time   CHOL 149 05/06/2023 1045   CHOL 151 12/29/2015 0954   TRIG 116 05/06/2023 1045   HDL 49 (L) 05/06/2023 1045   HDL 53 12/29/2015 0954   CHOLHDL 3.0 05/06/2023 1045   VLDL 16 01/27/2017 1128   LDLCALC 79 05/06/2023 1045   LABVLDL 24 12/29/2015 0954   Diabetes, Type 2: -Last A1c 9/24% 7.8 -Medications: Tresiba 10 units -Patient is compliant with the above medications and reports no side effects.  -Checking BG at home: checking in the afternoon, yesterday it was high at 220 -Eye exam: Due, scheduled for April  -Foot exam: UTD 9/24 -Microalbumin: UTD 9/24 -Statin: yes -PNA vaccine: UTD -Denies symptoms of hypoglycemia, polyuria, polydipsia, numbness extremities, foot ulcers/trauma.   Hypothyroidism: -Medications: Levothyroxine 112 mcg -Patient is compliant with the above medication (s) at the above dose and reports no medication side effects.  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, stable   MDD: -Mood status: stable -Current treatment: Zoloft 50 mg  -Satisfied with current treatment?: yes -Duration of current treatment : chronic -Side effects: no Medication compliance: excellent compliance     11/03/2023    9:59 AM 05/06/2023   10:07 AM 01/06/2023   11:58 AM 11/02/2022    3:16 PM 07/28/2022    3:14 PM  Depression screen PHQ 2/9  Decreased Interest 0 0 1 1 0  Down, Depressed, Hopeless 0 1 1 1  0  PHQ - 2 Score 0 1 2 2  0  Altered sleeping  1 2 0 0  Tired, decreased energy  2 0 0   Change in appetite  0 0 0 0  Feeling bad or failure about yourself   1 0 0 0  Trouble concentrating  0 0 0 0  Moving slowly or fidgety/restless  0 0 0 0  Suicidal thoughts  0 0 0 0  PHQ-9 Score  5 4 2  0  Difficult doing work/chores  Not difficult at all Somewhat difficult Not difficult at all    Arthritis: -Has pain in her legs, hands and back.  Is taking Tylenol as needed but it is not helping. -Had prescribed Celebrex 100 mg daily last year, patient states it has not been helping for her pain so she doesn't take it   Review of  Systems  Constitutional:  Negative for chills and fever.  Eyes:  Negative for blurred vision.  Respiratory:  Negative for shortness of breath.   Cardiovascular:  Negative for chest pain.  Musculoskeletal:  Positive for back pain.      Objective:     BP 138/76 (Cuff Size: Large)   Pulse 82   Temp 98 F (36.7 C) (Oral)   Resp 16   Ht 5' 1.5" (1.562 m)   Wt 183 lb 1.6 oz (83.1 kg)   SpO2 98%   BMI 34.04 kg/m  BP Readings from Last 3 Encounters:  11/03/23 138/76  05/06/23 120/70  11/02/22 136/80   Wt Readings from Last 3 Encounters:  08/31/23 183 lb 4.8 oz (83.1 kg)  05/06/23 183 lb 4.8 oz (83.1 kg)  01/06/23 182 lb (82.6 kg)      Physical Exam Constitutional:       Appearance: Normal appearance.  HENT:     Head: Normocephalic and atraumatic.  Eyes:     Conjunctiva/sclera: Conjunctivae normal.  Cardiovascular:     Rate and Rhythm: Normal rate and regular rhythm.     Pulses:          Dorsalis pedis pulses are 2+ on the right side and 2+ on the left side.  Pulmonary:     Effort: Pulmonary effort is normal.     Breath sounds: Normal breath sounds.  Musculoskeletal:     Right foot: Normal range of motion. No deformity, bunion, Charcot foot, foot drop or prominent metatarsal heads.     Left foot: Normal range of motion. No deformity, bunion, Charcot foot, foot drop or prominent metatarsal heads.  Feet:     Right foot:     Protective Sensation: 6 sites tested.  5 sites sensed.     Skin integrity: Skin integrity normal.     Toenail Condition: Right toenails are long.     Left foot:     Protective Sensation: 6 sites tested.  6 sites sensed.     Skin integrity: Skin integrity normal.     Toenail Condition: Left toenails are long.  Skin:    General: Skin is warm and dry.  Neurological:     General: No focal deficit present.     Mental Status: She is alert. Mental status is at baseline.  Psychiatric:        Mood and Affect: Mood normal.        Behavior: Behavior normal.      No results found for any visits on 11/03/23.   Last CBC Lab Results  Component Value Date   WBC 5.7 05/06/2023   HGB 13.9 05/06/2023   HCT 42.5 05/06/2023   MCV 89.9 05/06/2023   MCH 29.4 05/06/2023   RDW 13.0 05/06/2023   PLT 164 05/06/2023   Last metabolic panel Lab Results  Component Value Date   GLUCOSE 163 (H) 05/06/2023   NA 142 05/06/2023   K 4.2 05/06/2023   CL 105 05/06/2023   CO2 30 05/06/2023   BUN 21 05/06/2023   CREATININE 0.92 05/06/2023   EGFR 62 05/06/2023   CALCIUM 9.5 05/06/2023   PHOS 3.9 03/31/2020   PROT 6.7 05/06/2023   ALBUMIN 4.4 09/30/2016   LABGLOB 2.6 01/16/2016   AGRATIO 1.7 01/16/2016   BILITOT 1.0 05/06/2023   ALKPHOS 50  09/30/2016   AST 16 05/06/2023   ALT 14 05/06/2023   ANIONGAP 10 09/12/2020   Last lipids Lab Results  Component Value Date  CHOL 149 05/06/2023   HDL 49 (L) 05/06/2023   LDLCALC 79 05/06/2023   TRIG 116 05/06/2023   CHOLHDL 3.0 05/06/2023   Last hemoglobin A1c Lab Results  Component Value Date   HGBA1C 7.8 (H) 05/06/2023   Last thyroid functions Lab Results  Component Value Date   TSH 0.95 05/06/2023   Last vitamin D Lab Results  Component Value Date   VD25OH 40 11/24/2017   Last vitamin B12 and Folate Lab Results  Component Value Date   VITAMINB12 597 05/06/2023   FOLATE >24.0 02/19/2022      The ASCVD Risk score (Arnett DK, et al., 2019) failed to calculate for the following reasons:   The 2019 ASCVD risk score is only valid for ages 53 to 43    Assessment & Plan:   Type 2 diabetes mellitus with microalbuminuria, with long-term current use of insulin (HCC) Assessment & Plan: A1c good today at 7.5%, continue Tresiba 10 units.   Orders: -     POCT glycosylated hemoglobin (Hb A1C) -     Evaristo Bury FlexTouch; Inject 10 Units into the skin daily.  Dispense: 18 mL; Refill: 0 -     OneTouch Delica Plus Lancet30G; USE TO TEST UP TO FOUR TIMES A DAY AS DIRECTED  Dispense: 100 each; Refill: 1  Benign hypertension Assessment & Plan: BP borderline but patient having symptoms of orthostatic hypotension, will continue Lisinopril 40 mg and hold Amlodipine at night.   Orders: -     Lisinopril; Take 1 tablet (40 mg total) by mouth at bedtime.  Dispense: 90 tablet; Refill: 1  Dyslipidemia associated with type 2 diabetes mellitus (HCC) Assessment & Plan: Stable, refill statin.   Orders: -     Atorvastatin Calcium; Take 1 tablet (40 mg total) by mouth daily.  Dispense: 90 tablet; Refill: 1  Major depression, recurrent, chronic (HCC) Assessment & Plan: Moods controlled, continue and refill zoloft.   Orders: -     Sertraline HCl; Take 1 tablet (50 mg total) by mouth  daily.  Dispense: 90 tablet; Refill: 1  Adult hypothyroidism Assessment & Plan: Stable, continue Levothyroxine, refilled.  Orders: -     Levothyroxine Sodium; Take 1 tablet (112 mcg total) by mouth daily before breakfast.  Dispense: 90 tablet; Refill: 1  Ambulatory dysfunction -     For home use only DME Other see comment  DME order placed for 3 pronged cane.    Return in about 6 months (around 05/05/2024).    Margarita Mail, DO

## 2023-11-03 NOTE — Assessment & Plan Note (Signed)
 Stable, continue Levothyroxine, refilled.

## 2023-11-04 DIAGNOSIS — R262 Difficulty in walking, not elsewhere classified: Secondary | ICD-10-CM | POA: Diagnosis not present

## 2023-11-22 ENCOUNTER — Ambulatory Visit (INDEPENDENT_AMBULATORY_CARE_PROVIDER_SITE_OTHER): Admitting: Internal Medicine

## 2023-11-22 ENCOUNTER — Ambulatory Visit: Payer: Self-pay

## 2023-11-22 ENCOUNTER — Encounter: Payer: Self-pay | Admitting: Internal Medicine

## 2023-11-22 ENCOUNTER — Other Ambulatory Visit: Payer: Self-pay

## 2023-11-22 VITALS — BP 140/82 | HR 93 | Temp 97.9°F | Resp 16 | Ht 61.5 in | Wt 183.7 lb

## 2023-11-22 DIAGNOSIS — M7989 Other specified soft tissue disorders: Secondary | ICD-10-CM

## 2023-11-22 NOTE — Telephone Encounter (Signed)
 Chief Complaint: lump to the R leg Symptoms: lump to R thigh, some knee swelling Frequency: noticed it yesterday Pertinent Negatives: Patient denies CP, SOB, difficulty ambulating d/t lump, pain Disposition: [] ED /[] Urgent Care (no appt availability in office) / [x] Appointment(In office/virtual)/ []  [place] [facility]/ [] Home Care/ [] Refused Recommended Disposition /[]  Mobile Bus/ []  Follow-up with PCP Additional Notes: Granddaughter Lelon Mast called about the patient. RN spoke to pt's husband first. Currently patient is sleeping. Husband states pt has a lump to her R thigh and states the pt feels like "something is between her legs" when she sits down. Husband also endorses some swelling to the pt's R knee as well. Husband denies that the patient has pain. Husband states neither he nor the pt drive anymore and asked RN to speak with Lelon Mast since she would be the one driving patient to an appt. RN called Lelon Mast. Lelon Mast states the lump is "jiggly", the size of an orange, and skin-colored. Denies pain or tenderness. Denies skin color change, numbness, tingling, CP, SOB. Denies that the lump is making it more difficult to walk (pt has some difficulty d/t a back problem). RN asked Lelon Mast about the pt's R knee swelling, Lelon Mast states she did not notice it herself. RN advised Lelon Mast pt should be seen within 4 hours. Lelon Mast states she is at school all day but will call her dad to see if he can assist with transportation. RN scheduled pt for 3:40pm this afternoon in the office and advised Lelon Mast she can give [place] a call back if the pt would be unable to make that appt. RN advised Lelon Mast and pt's husband if the pt develops CP or SOB they need to call 911 and to call us back with any worsening. They all verbalized understanding.    Reason for Disposition  [1] Thigh, calf, or ankle swelling AND [2] only 1 side  Answer Assessment - Initial Assessment Questions 1. APPEARANCE of  SWELLING: "What does it look like?"     Lump on the R leg, feels like "something is between her legs when she sits down" 2. SIZE: "How large is the swelling?" (e.g., inches, cm; or compare to size of pinhead, tip of pen, eraser, coin, pea, grape, ping pong ball)      Size of an orange  3. LOCATION: "Where is the swelling located?"     R leg - upper thigh 4. ONSET: "When did the swelling start?"     Noticed it yesterday 5. COLOR: "What color is it?" "Is there more than one color?"     "I can't tell you that" 6. PAIN: "Is there any pain?" If Yes, ask: "How bad is the pain?" (e.g., scale 1-10; or mild, moderate, severe)     - NONE (0): no pain   - MILD (1-3): doesn't interfere with normal activities    - MODERATE (4-7): interferes with normal activities or awakens from sleep    - SEVERE (8-10): excruciating pain, unable to do any normal activities     Uncomfortable when she sits but not necessarily painful, not tender on palpation 7. ITCH: "Does it itch?" If Yes, ask: "How bad is the itch?"      No 8. CAUSE: "What do you think caused the swelling?" 9 OTHER SYMPTOMS: "Do you have any other symptoms?" (e.g., fever)     No fever. Some difficulty walking d/t back problem. Not endorsing SOB or CP. Denies hx of DVT - but pt worried about that. Denies N/V/D. Husband states  her knee is "swollen up" and the leg "wants to tingle at night and it keeps her from sleeping." Knee has been swollen a day or two per husband. Same leg as the lump. Husband does not know if she has had leg swelling before. "Might be a little redness to her knee." Pt stated yesterday her R leg is better than her L. Then [place] her granddaughter stated her legs looked to be the same size.  Answer Assessment - Initial Assessment Questions 1. ONSET: "When did the swelling start?" (e.g., minutes, hours, days)     First noticed it yesterday 2. LOCATION: "What part of the leg is swollen?"  "Are both legs swollen or just one leg?"      Orange sized area to R leg 3. SEVERITY: "How bad is the swelling?" (e.g., localized; mild, moderate, severe)   - Localized: Small area of swelling localized to one leg.   - MILD pedal edema: Swelling limited to foot and ankle, pitting edema < 1/4 inch (6 mm) deep, rest and elevation eliminate most or all swelling.   - MODERATE edema: Swelling of lower leg to knee, pitting edema > 1/4 inch (6 mm) deep, rest and elevation only partially reduce swelling.   - SEVERE edema: Swelling extends above knee, facial or hand swelling present.      Orange sized lump - "jiggly" 4. REDNESS: "Does the swelling look red or infected?"     No 5. PAIN: "Is the swelling painful to touch?" If Yes, ask: "How painful is it?"   (Scale 1-10; mild, moderate or severe)     No 6. FEVER: "Do you have a fever?" If Yes, ask: "What is it, how was it measured, and when did it start?"      No 7. CAUSE: "What do you think is causing the leg swelling?"     Not sure  8. MEDICAL HISTORY: "Do you have a history of blood clots (e.g., DVT), cancer, heart failure, kidney disease, or liver failure?"     No DVT. Hx of HTN and DM2 9. RECURRENT SYMPTOM: "Have you had leg swelling before?" If Yes, ask: "When was the last time?" "What happened that time?"     No 10. OTHER SYMPTOMS: "Do you have any other symptoms?" (e.g., chest pain, difficulty breathing)       Orange sized lump to the R thigh and some knee swelling (per husband)  Protocols used: Skin Lump or Localized Swelling-A-AH, Leg Swelling and Edema-A-AH

## 2023-11-22 NOTE — Telephone Encounter (Signed)
 RN contacted granddaughter Lelon Mast for more information. Samantha did not answer, RN LVM with a callback number. RN also contacted the patient. Patient did not answer, RN LVM with a callback number.

## 2023-11-22 NOTE — Progress Notes (Signed)
   Acute Office Visit  Subjective:     Patient ID: Lindsey [name], female    DOB: 24-Jun-1941, 83 y.o.   MRN: [id number]  Chief Complaint  Patient presents with   Leg Swelling    Left inner thigh for 3 days    HPI Patient is in today for swelling in her left inner thigh.  She first noticed this about 3 days ago.  Denies change in size, pain but does have mild swelling.  She denies fevers or any other skin changes.  Review of Systems  Constitutional:  Negative for chills and fever.  Cardiovascular:  Negative for leg swelling.  Skin:  Negative for itching and rash.        Objective:    BP (!) 140/82 (Cuff Size: Normal)   Pulse 93   Temp 97.9 F (36.6 C) (Oral)   Resp 16   Ht 5' 1.5" (1.562 m)   Wt 183 lb 11.2 oz (83.3 kg)   SpO2 98%   BMI 34.15 kg/m    Physical Exam Constitutional:      Appearance: Normal appearance.  HENT:     Head: Normocephalic and atraumatic.  Eyes:     Conjunctiva/sclera: Conjunctivae normal.  Cardiovascular:     Rate and Rhythm: Normal rate and regular rhythm.  Pulmonary:     Effort: Pulmonary effort is normal.     Breath sounds: Normal breath sounds.  Skin:    General: Skin is warm and dry.     Comments: Large cysts like structure on her left inner thigh, nontender without overlapping skin changes.  Consistent with cyst versus lipoma.  Neurological:     General: No focal deficit present.     Mental Status: She is alert. Mental status is at baseline.  Psychiatric:        Mood and Affect: Mood normal.        Behavior: Behavior normal.     No results found for any visits on 11/22/23.      Assessment & Plan:   1. Cyst of soft tissue (Primary): Large cyst vs. Lipoma on exam, will order [place] for further evaluation. Most likely will require surgical referral either for drainage or excision. No signs of infection/DVT but patient will call with changes.   - [place] LT LOWER EXTREM LTD SOFT TISSUE NON VASCULAR; Future   Return if symptoms  worsen or fail to improve.  Margarita Mail, DO

## 2023-11-30 ENCOUNTER — Ambulatory Visit
Admission: RE | Admit: 2023-11-30 | Discharge: 2023-11-30 | Disposition: A | Discharge status: Home / Self Care | Source: Ambulatory Visit | Attending: Internal Medicine | Admitting: Internal Medicine

## 2023-11-30 DIAGNOSIS — M7989 Other specified soft tissue disorders: Secondary | ICD-10-CM | POA: Diagnosis not present

## 2023-11-30 DIAGNOSIS — R2242 Localized swelling, mass and lump, left lower limb: Secondary | ICD-10-CM | POA: Diagnosis not present

## 2023-12-01 ENCOUNTER — Ambulatory Visit (INDEPENDENT_AMBULATORY_CARE_PROVIDER_SITE_OTHER): Payer: PPO | Admitting: Podiatry

## 2023-12-01 ENCOUNTER — Encounter: Payer: Self-pay | Admitting: Podiatry

## 2023-12-01 VITALS — Ht 61.5 in | Wt 183.7 lb

## 2023-12-01 DIAGNOSIS — Z794 Long term (current) use of insulin: Secondary | ICD-10-CM

## 2023-12-01 DIAGNOSIS — R809 Proteinuria, unspecified: Secondary | ICD-10-CM | POA: Diagnosis not present

## 2023-12-01 DIAGNOSIS — M79674 Pain in right toe(s): Secondary | ICD-10-CM

## 2023-12-01 DIAGNOSIS — B351 Tinea unguium: Secondary | ICD-10-CM

## 2023-12-01 DIAGNOSIS — E1129 Type 2 diabetes mellitus with other diabetic kidney complication: Secondary | ICD-10-CM | POA: Diagnosis not present

## 2023-12-01 DIAGNOSIS — M79675 Pain in left toe(s): Secondary | ICD-10-CM | POA: Diagnosis not present

## 2023-12-01 NOTE — Progress Notes (Signed)

## 2023-12-12 ENCOUNTER — Encounter: Payer: Self-pay | Admitting: Internal Medicine

## 2023-12-12 DIAGNOSIS — E113393 Type 2 diabetes mellitus with moderate nonproliferative diabetic retinopathy without macular edema, bilateral: Secondary | ICD-10-CM | POA: Diagnosis not present

## 2023-12-16 ENCOUNTER — Ambulatory Visit: Payer: Self-pay

## 2023-12-16 DIAGNOSIS — E1129 Type 2 diabetes mellitus with other diabetic kidney complication: Secondary | ICD-10-CM | POA: Diagnosis not present

## 2023-12-16 DIAGNOSIS — L03116 Cellulitis of left lower limb: Secondary | ICD-10-CM | POA: Diagnosis not present

## 2023-12-16 DIAGNOSIS — Z794 Long term (current) use of insulin: Secondary | ICD-10-CM | POA: Diagnosis not present

## 2023-12-16 DIAGNOSIS — W5503XA Scratched by cat, initial encounter: Secondary | ICD-10-CM | POA: Diagnosis not present

## 2023-12-16 DIAGNOSIS — I10 Essential (primary) hypertension: Secondary | ICD-10-CM | POA: Diagnosis not present

## 2023-12-16 DIAGNOSIS — R809 Proteinuria, unspecified: Secondary | ICD-10-CM | POA: Diagnosis not present

## 2023-12-16 NOTE — Telephone Encounter (Signed)
  Chief Complaint: left leg swelling Symptoms: swelling of left leg below knee, redness, mild pain Frequency: Wednesday night  Pertinent Negatives: Patient denies fever, chest pain, sob Disposition: [] ED /[x] Urgent Care (no appt availability in office) / [] Appointment(In office/virtual)/ []  [place] [facility]/ [] Home Care/ [] Refused Recommended Disposition /[] [place] [facility]/ []  Follow-up with PCP Additional Notes: Patient and granddaughter called in and report patient has been experiencing swelling in her left leg from her shin down to her foot since Wednesday. Granddaughter states the area is very red, swollen, and mildly painful to touch. Of note patient was seen at the beginning of the month for a cyst on her left leg, but granddaughter states the cyst is on her thigh and not near the current area of swelling and redness. Per protocol, attempted to schedule in office appt. No availability today so recommending UC at this time. Advised to call back with worsening symptoms. Granddaughter verbalized understanding.  Copied from CRM [id number]. Topic: Clinical - Red Word Triage >> Dec 16, 2023  3:47 PM Sasha H wrote: Red Word that prompted transfer to Nurse Triage: pt and her granddaughter, Ova Bloomer called in that pt is having swelling, redness, and warm to touch on her left leg from her shin down to her foot. Reason for Disposition  [1] Red area or streak [2] large (> 2 in. or 5 cm)  Answer Assessment - Initial Assessment Questions 1. ONSET: "When did the swelling start?" (e.g., minutes, hours, days) wednesday 2. LOCATION: "What part of the leg is swollen?"  "Are both legs swollen or just one leg?"     Left leg, shin to foot 3. SEVERITY: "How bad is the swelling?" (e.g., localized; mild, moderate, severe)   - Localized: Small area of swelling localized to one leg.   - MILD pedal edema: Swelling limited to foot and ankle, pitting edema < 1/4 inch (6 mm) deep, rest and elevation eliminate  most or all swelling.   - MODERATE edema: Swelling of lower leg to knee, pitting edema > 1/4 inch (6 mm) deep, rest and elevation only partially reduce swelling.   - SEVERE edema: Swelling extends above knee, facial or hand swelling present.      moderate 4. REDNESS: "Does the swelling look red or infected?"     redness 5. PAIN: "Is the swelling painful to touch?" If Yes, ask: "How painful is it?"   (Scale 1-10; mild, moderate or severe)     Mild,  2/10 6. FEVER: "Do you have a fever?" If Yes, ask: "What is it, how was it measured, and when did it start?"      denies 7. CAUSE: "What do you think is causing the leg swelling?"     unsure 8. MEDICAL HISTORY: "Do you have a history of blood clots (e.g., DVT), cancer, heart failure, kidney disease, or liver failure?"     denies 9. RECURRENT SYMPTOM: "Have you had leg swelling before?" If Yes, ask: "When was the last time?" "What happened that time?"     denies 10. OTHER SYMPTOMS: "Do you have any other symptoms?" (e.g., chest pain, difficulty breathing)       denies  Protocols used: Leg Swelling and Edema-A-AH

## 2023-12-16 NOTE — Progress Notes (Signed)
 History of Present Illness:   Lindsey [name] is a 83 y.o. female here for   Verbally consented to the use of AI for note-taking.   Chief Complaint  Patient presents with  . Foot Swelling    Patient states that she has had lower leg and foot swelling.  Started Wednesday.  Has warmth and swelling.  Does have some scratches on her legs, she states that this is from her cat.  No fever.  Has not put any medicine on it.  Has diabetes.    History of Present Illness Lindsey [name] is an 83 year old female with diabetes who presents with cat scratches on her leg and concerns of cellulitis.  She was scratched by her cat on her left lower leg approximately two days ago, leading to swelling and redness in the area. Initially, the leg was hot, but it is not currently. The redness, described as 'bright red', and swelling has improved since yesterday. She experiences tenderness in the leg, especially when pressure is applied, and pain in her foot when lifting her leg. No fever, chills, or pain behind the knee or when flexing her foot.  An ultrasound of her leg on April 9th was negative for a blood clot, focusing on the upper part of her leg and was negative.  Her blood pressure was elevated at 168/80, which she attributes to being nervous, with a subsequent reading showing improvement at 152/68.    Past Medical History:   Past Medical History:  Diagnosis Date  . Anxiety   . Arthritis   . Bursitis   . Closed fracture of left ulna and radius   . Complication of anesthesia   . Depression   . GERD (gastroesophageal reflux disease)   . History of breast cancer 1983, 11/04/2015  . Hypercholesteremia   . Hyperlipidemia   . Hypertension   . Hypothyroid   . Osteoporosis 09/29/2015   post-menopausal  . PONV (postoperative nausea and vomiting)   . Type 2 diabetes mellitus (CMS/HHS-HCC) 04/25/2015    Past Surgical History:   Past Surgical History:  Procedure Laterality Date  . MASTECTOMY SIMPLE  Right 1983  . MASTECTOMY SIMPLE Left 2017   T1c, N0, ER/PR positive, HER-2 negative.  . ORIF ULNAR / RADIAL SHAFT FRACTURE Left 12/11/2018  . RADIAL HEAD ARTHROPLASTY Left 12/11/2018  . CHOLECYSTECTOMY    . INCISIONAL BIOPSY BREAST Left 10-10-07, 11-04-15  . LAPAROSCOPIC CHOLECYSTECTOMY      Allergies:  No Known Allergies  Current Medications:   Prior to Admission medications   Medication Sig Taking? Last Dose  atorvastatin  (LIPITOR) 40 MG tablet Take 40 mg by mouth once daily Yes Taking  cholecalciferol (CHOLECALCIFEROL) 1,000 unit tablet Take by mouth once daily    Yes Taking  cyanocobalamin  (VITAMIN B12) 1000 MCG tablet Take 1,000 mcg by mouth once daily Yes Taking  JANUVIA  100 mg tablet Take 100 mg by mouth once daily    Yes Taking  ketoconazole  (NIZORAL ) 2 % cream Apply topically 2 (two) times daily    Yes PRN Not Currently Taking  letrozole  (FEMARA ) 2.5 mg tablet Take 2.5 mg by mouth once daily Yes Taking  lisinopril  (PRINIVIL ,ZESTRIL ) 20 MG tablet Take 40 mg by mouth once daily    Yes Taking  naproxen sodium (ALEVE, ANAPROX) 220 MG tablet Take 220 mg by mouth 3 (three) times daily as needed for Pain Yes PRN Not Currently Taking  sertraline  (ZOLOFT ) 50 MG tablet Take 50 mg by mouth once daily  Yes Taking  SYNTHROID  112 mcg tablet Take 112 mcg by mouth once daily Take on an empty stomach with a glass of water at least 30-60 minutes before breakfast. Yes Taking  TRESIBA  FLEXTOUCH U-100 pen injector (concentration 100 units/mL) Inject 10 Units subcutaneously Yes   aspirin 81 MG EC tablet Take 81 mg by mouth once daily Patient not taking: Reported on 12/16/2023  Not Taking  pregabalin  (LYRICA ) 50 MG capsule Take 50 mg by mouth 3 (three) times daily    Patient not taking: Reported on 12/16/2023  Not Taking    Family History:   Family History  Problem Relation Name Age of Onset  . High blood pressure (Hypertension) Mother    . Kidney disease Mother    . High blood pressure  (Hypertension) Sister    . Breast cancer Sister    . Stroke Sister    . High blood pressure (Hypertension) Brother    . Kidney disease Brother    . Stroke Brother    . Diabetes Brother    . Breast cancer Daughter    . Heart disease Father    . Colon cancer Neg Hx      Social History:   Social History   Socioeconomic History  . Marital status: Married  Tobacco Use  . Smoking status: Never  . Smokeless tobacco: Never  Vaping Use  . Vaping status: Never Used  Substance and Sexual Activity  . Alcohol use: Not Currently  . Drug use: Never  . Sexual activity: Defer   Social Drivers of Health   Financial Resource Strain: Low Risk  (12/16/2023)   Overall Financial Resource Strain (CARDIA)   . Difficulty of Paying Living Expenses: Not hard at all  Food Insecurity: No Food Insecurity (12/16/2023)   Hunger Vital Sign   . Worried About Programme researcher, broadcasting/film/video in the Last Year: Never true   . Ran Out of Food in the Last Year: Never true  Transportation Needs: No Transportation Needs (12/16/2023)   PRAPARE - Transportation   . Lack of Transportation (Medical): No   . Lack of Transportation (Non-Medical): No  Physical Activity: Inactive (01/06/2023)   Received from [facility]   Exercise Vital Sign   . Days of Exercise per Week: 0 days   . Minutes of Exercise per Session: 0 min  Stress: Stress Concern Present (01/06/2023)   Received from [facility] of Occupational Health - Occupational Stress Questionnaire   . Feeling of Stress : Rather much  Social Connections: Moderately Isolated (01/06/2023)   Received from [facility]   Social Connection and Isolation Panel [NHANES]   . Frequency of Social Gatherings with Friends and Family: Three times a week   . Attends Religious Services: Never   . Active Member of Clubs or Organizations: No   . Attends Banker Meetings: Never   . Marital Status: Married  Housing Stability: Unknown (12/16/2023)   Housing  Stability Vital Sign   . Unable to Pay for Housing in the Last Year: No   . Homeless in the Last Year: No    Review of Systems:   A 10 point review of systems is negative, except for the pertinent positives and negatives detailed in the HPI.  Vitals:   Vitals:   12/16/23 1755 12/16/23 1836  BP: (!) 168/80 (!) 152/68  Pulse: 83   Temp: 36.6 C (97.8 F)   SpO2: 96%   Weight: 83 kg (183 lb)  Height: 154.9 cm (5' 1)      Body mass index is 34.58 kg/m.  Physical Exam:   Physical Exam Vitals and nursing note reviewed.  Constitutional:      General: She is not in acute distress.    Appearance: Normal appearance. She is not ill-appearing, toxic-appearing or diaphoretic.  HENT:     Head: Normocephalic and atraumatic.     Right Ear: External ear normal.     Left Ear: External ear normal.  Eyes:     General:        Right eye: No discharge.        Left eye: No discharge.     Conjunctiva/sclera: Conjunctivae normal.  Cardiovascular:     Rate and Rhythm: Normal rate and regular rhythm.     Pulses: Normal pulses.     Heart sounds: Normal heart sounds. No murmur heard.    No friction rub. No gallop.  Pulmonary:     Effort: Pulmonary effort is normal. No respiratory distress.     Breath sounds: Normal breath sounds. No stridor. No wheezing, rhonchi or rales.  Chest:     Chest wall: No tenderness.  Skin:    General: Skin is warm and dry.     Capillary Refill: Capillary refill takes less than 2 seconds.     Findings: Erythema present.          Comments: 2+ edema and erythema to left lower leg.  Several scabbed over abrasions from recent cat scratches.  Neurological:     Mental Status: She is alert.     Assessment and Plan:  No results found for this visit on 12/16/23.  Diagnoses and all orders for this visit:  Cellulitis of left lower extremity  Cat scratch  Type 2 diabetes mellitus with microalbuminuria, with long-term current use of insulin   (CMS/HHS-HCC)  Benign hypertension    Assessment & Plan Cellulitis Cellulitis of the leg following cat scratches, with swelling and redness. No fever or chills. Given the diabetes, there is an increased risk of complications, warranting antibiotic therapy. - Prescribe doxycycline  to be taken twice daily with food to prevent gastrointestinal upset. - Advise leg elevation to reduce swelling, using a recliner or pillows. - Encourage adequate hydration. - Instruct to seek emergency care if no improvement or worsening within 24 hours.  Diabetes Diabetes complicates cellulitis healing and increases infection risk.  Hypertension Blood pressure was elevated at 168/80 mmHg initially, likely due to anxiety. Upon recheck, it was 152/68 mmHg, indicating some improvement but still elevated.  Follow up She will call the office with any new or worsening symptoms.   This note has been created using automated tools and reviewed for accuracy by provider.  Patient received an After Visit Summary    Attestation Statement:   I personally performed the service, non-incident to. (WP)   GLENDA MACARIO HADDOCK, NP

## 2024-01-12 ENCOUNTER — Ambulatory Visit: Payer: PPO

## 2024-01-12 VITALS — BP 138/68 | Ht 61.5 in | Wt 185.5 lb

## 2024-01-12 DIAGNOSIS — Z0000 Encounter for general adult medical examination without abnormal findings: Secondary | ICD-10-CM | POA: Diagnosis not present

## 2024-01-12 NOTE — Patient Instructions (Signed)
 Ms. Stailey , Thank you for taking time out of your busy schedule to complete your Annual Wellness Visit with me. I enjoyed our conversation and look forward to speaking with you again next year. I, as well as your care team,  appreciate your ongoing commitment to your health goals. Please review the following plan we discussed and let me know if I can assist you in the future.   Follow up Visits: Next Medicare AWV with our clinical staff: 01/17/25 @ 12:40 PM IN PERSON   Have you seen your provider in the last 6 months (3 months if uncontrolled diabetes)? Yes   Clinician Recommendations:  Aim for 30 minutes of exercise or brisk walking, 6-8 glasses of water, and 5 servings of fruits and vegetables each day. TAKE CARE!      This is a list of the screening recommended for you and due dates:  Health Maintenance  Topic Date Due   Zoster (Shingles) Vaccine (1 of 2) Never done   COVID-19 Vaccine (3 - Pfizer risk series) 11/17/2019   Eye exam for diabetics  06/22/2022   Flu Shot  03/23/2024   Yearly kidney function blood test for diabetes  05/05/2024   Yearly kidney health urinalysis for diabetes  05/05/2024   Complete foot exam   05/05/2024   Hemoglobin A1C  05/05/2024   Medicare Annual Wellness Visit  01/11/2025   DTaP/Tdap/Td vaccine (2 - Td or Tdap) 06/23/2030   Pneumonia Vaccine  Completed   DEXA scan (bone density measurement)  Completed   HPV Vaccine  Aged Out   Meningitis B Vaccine  Aged Out    Advanced directives: (ACP Link)Information on Advanced Care Planning can be found at [place]  Secretary of [facility] Advance Health Care Directives Advance Health Care Directives. [email or web address]  Advance Care Planning is important because it:  [x]  Makes sure you receive the medical care that is consistent with your values, goals, and preferences  [x]  It provides guidance to your family and loved ones and reduces their decisional burden about whether or not they are making the right decisions based  on your wishes.  Follow the link provided in your after visit summary or read over the paperwork we have mailed to you to help you started getting your Advance Directives in place. If you need assistance in completing these, please reach out to us  so that we can help you!

## 2024-01-12 NOTE — Progress Notes (Signed)
 Subjective:   Kameah Rawl Heyward is a 83 y.o. who presents for a Medicare Wellness preventive visit.  As a reminder, Annual Wellness Visits don't include a physical exam, and some assessments Beltre be limited, especially if this visit is performed virtually. We Nicholson recommend an in-person follow-up visit with your provider if needed.  Visit Complete: In person   Persons Participating in Visit: Patient.  AWV Questionnaire: No: Patient Medicare AWV questionnaire was not completed prior to this visit.  Cardiac Risk Factors include: advanced age (>30men, >84 women);diabetes mellitus;dyslipidemia;female gender;hypertension;sedentary lifestyle;obesity (BMI >30kg/m2)     Objective:     Today's Vitals   01/12/24 1121  BP: 138/68  Weight: 185 lb 8 oz (84.1 kg)  Height: 5' 1.5" (1.562 m)   Body mass index is 34.48 kg/m.     01/12/2024   11:31 AM 01/06/2023   12:02 PM 12/24/2021    4:02 PM 12/23/2020    3:58 PM 09/12/2020    5:12 PM 12/04/2019    2:27 PM 12/08/2018    1:56 PM  Advanced Directives  Does Patient Have a Medical Advance Directive? No Yes Yes Yes No No Yes  Type of Special educational needs teacher of [place];Living will Healthcare Power of [place];Living will Healthcare Power of [place];Living will   Healthcare Power of [place];Living will  Does patient want to make changes to medical advance directive?       No - Patient declined  Copy of Healthcare Power of Attorney in Chart?   No - copy requested No - copy requested     Would patient like information on creating a medical advance directive? No - Patient declined    No - Patient declined No - Patient declined     Current Medications (verified) Outpatient Encounter Medications as of 01/12/2024  Medication Sig   acetaminophen  (TYLENOL ) 650 MG CR tablet Take 1,300 mg by mouth daily.   amLODipine  (NORVASC ) 2.5 MG tablet Take 1 tablet (2.5 mg total) by mouth daily.   atorvastatin  (LIPITOR) 40 MG tablet Take 1 tablet (40 mg total)  by mouth daily.   B-D UF III MINI PEN NEEDLES 31G X 5 MM MISC USE TO INJECT INSULIN    blood glucose meter kit and supplies Dispense based on patient and insurance preference. Use up to four times daily as directed. (FOR ICD-10 E10.9, E11.9).   cholecalciferol (VITAMIN D ) 1000 UNITS tablet Take by mouth.   glucose blood (ONETOUCH ULTRA) test strip USE TO TEST FOUR TIMES DAILY   hydrocortisone  cream 1 % Apply 1 application. topically 2 (two) times daily.   insulin  degludec (TRESIBA  FLEXTOUCH) 100 UNIT/ML FlexTouch Pen Inject 10 Units into the skin daily.   ketoconazole  (NIZORAL ) 2 % cream Apply topically 2 (two) times daily.   Lancets (ONETOUCH DELICA PLUS LANCET30G) MISC USE TO TEST UP TO FOUR TIMES A DAY AS DIRECTED   levothyroxine  (SYNTHROID ) 112 MCG tablet Take 1 tablet (112 mcg total) by mouth daily before breakfast.   lisinopril  (ZESTRIL ) 40 MG tablet Take 1 tablet (40 mg total) by mouth at bedtime.   sertraline  (ZOLOFT ) 50 MG tablet Take 1 tablet (50 mg total) by mouth daily.   No facility-administered encounter medications on file as of 01/12/2024.    Allergies (verified) Patient has no known allergies.   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   Bursitis    R knee   Closed fracture of left ulna and radius    Complication of anesthesia    Depression    Diabetes mellitus type 2, controlled, without complications (HCC) 04/25/2015   Diabetes mellitus without complication (HCC)    GERD (gastroesophageal reflux disease)    TUMS   Hyperlipidemia    Hypertension    Hypothyroidism    Osteoporosis    Osteoporosis, 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   PONV (postoperative nausea and vomiting)    Thyroid  disease    Past Surgical History:  Procedure Laterality Date   BREAST BIOPSY Left 11-04-15    BREAST BIOPSY Left 10-10-07   CATARACT EXTRACTION Bilateral 1990   CHOLECYSTECTOMY     IR KYPHO EA ADDL LEVEL THORACIC OR LUMBAR  12/25/2020   IR KYPHO LUMBAR INC FX REDUCE BONE BX UNI/BIL CANNULATION INC/IMAGING  12/25/2020   IR RADIOLOGIST EVAL & MGMT  12/17/2020   IR VERTEBROPLASTY LUMBAR BX INC UNI/BIL INC/INJECT/IMAGING  12/25/2020   MASTECTOMY Right 1983   Dr Felipa Horsfall   MASTECTOMY Right 1983   MASTECTOMY W/ SENTINEL NODE BIOPSY Left 12/12/2015   Procedure: MASTECTOMY WITH SENTINEL LYMPH NODE BIOPSY;  Surgeon: Jerlean Mood, MD;  Location: [facility];  Service: General;  Laterality: Left;   ORIF ULNAR FRACTURE Left 12/11/2018   Procedure: OPEN REDUCTION INTERNAL FIXATION (ORIF) LEFT PROXIMAL ULNA FRACTURE AND  RADIAL HEAD REPLACEMENT;  Surgeon: Rober Chimera, MD;  Location: [facility];  Service: Orthopedics;  Laterality: Left;   RADIAL HEAD ARTHROPLASTY Left 12/11/2018   Procedure: OPEN REDUCTION INTERNAL FIXATION (ORIF) LEFT PROXIMAL ULNA FRACTURE AND  RADIAL HEAD REPLACEMENT;  Surgeon: Rober Chimera, MD;  Location: [facility];  Service: Orthopedics;  Laterality: Left;   Family History  Problem Relation Age of Onset   Kidney disease Mother    Heart disease Father    Stroke Sister    Cancer Sister        breast   Breast cancer Sister    Stroke Brother    Diabetes Brother    Hypertension Brother    Cancer Daughter 53       DCIS/lumpectomy/genetic negative   Breast cancer Daughter    Social History   Socioeconomic History   Marital status: Married    Spouse name: Not on file   Number of children: 3   Years of education: Not on file   Highest education level: Not on file  Occupational History   Occupation: Retired  Tobacco Use   Smoking status: Never   Smokeless tobacco: Never   Tobacco comments:    Non-smoker. Smoking cessation materials contraindicated  Vaping Use   Vaping status: Never Used  Substance and Sexual Activity    Alcohol use: No    Alcohol/week: 0.0 standard drinks of alcohol   Drug use: No   Sexual activity: Never  Other Topics Concern   Not on file  Social History Narrative   Not on file   Social Drivers of Health   Financial Resource Strain: Low Risk  (01/12/2024)   Overall Financial Resource Strain (CARDIA)    Difficulty of Paying Living Expenses: Not hard at all  Food Insecurity: No Food Insecurity (01/12/2024)   Hunger Vital Sign    Worried About Running Out of Food in the Last Year: Never true    Ran Out of Food in the Last Year: Never true  Transportation Needs: No Transportation Needs (01/12/2024)   PRAPARE - Administrator, Civil Service (Medical): No    Lack of Transportation (Non-Medical): No  Physical Activity: Insufficiently Active (01/12/2024)   Exercise Vital Sign    Days of Exercise per Week: 2 days    Minutes of Exercise per Session: 20 min  Stress: Stress Concern Present (01/12/2024)   [employer] of Occupational Health - Occupational Stress Questionnaire    Feeling of Stress : To some extent  Social Connections: Socially Isolated (01/12/2024)   Social Connection and Isolation Panel [NHANES]    Frequency of Communication with Friends and Family: Once a week    Frequency of Social Gatherings with Friends and Family: Once a week    Attends Religious Services: Never    Database administrator or Organizations: No    Attends Banker Meetings: Never    Marital Status: Married    Tobacco Counseling Counseling given: Not Answered Tobacco comments: Non-smoker. Smoking cessation materials contraindicated    Clinical Intake:  Pre-visit preparation completed: Yes  Pain : No/denies pain     BMI - recorded: 34 Nutritional Status: BMI > 30  Obese Nutritional Risks: None Diabetes: Yes CBG done?: No Did pt. bring in CBG monitor from home?: No  Lab Results  Component Value Date   HGBA1C 7.5 (A) 11/03/2023   HGBA1C 7.8 (H) 05/06/2023    HGBA1C 7.4 (A) 11/02/2022     How often do you need to have someone help you when you read instructions, pamphlets, or other written materials from your doctor or pharmacy?: 1 - Never  Interpreter Needed?: No  Information entered by :: Dellie Fergusson, LPN   Activities of Daily Living     01/12/2024   11:32 AM 11/03/2023    9:59 AM  In your present state of health, do you have any difficulty performing the following activities:  Hearing? 1 1  Vision? 0 0  Difficulty concentrating or making decisions? 0 1  Walking or climbing stairs? 1 1  Dressing or bathing? 0 0  Doing errands, shopping? 1 0  Preparing Food and eating ? N   Using the Toilet? N   In the past six months, have you accidently leaked urine? Y   Do you have problems with loss of bowel control? Y   Managing your Medications? N   Managing your Finances? N   Housekeeping or managing your Housekeeping? N     Patient Care Team: Rockney Cid, DO as PCP - General (Internal Medicine) Dayne Even, MD as Consulting Physician (Orthopedic Surgery) Bary Boss., MD (Ophthalmology)  Indicate any recent Medical Services you [place] have received from other than [facility] providers in the past year (date [place] be approximate).     Assessment:    This is a routine wellness examination for Janiylah.  Hearing/Vision screen Hearing Screening - Comments:: WEARS AIDS, BOTH EARS  Vision Screening - Comments:: WEARS GLASSES ALL DAY- DR.BELL   Goals Addressed             This Visit's Progress    DIET - EAT MORE FRUITS AND VEGETABLES         Depression Screen     01/12/2024   11:29 AM 11/03/2023    9:59 AM 05/06/2023   10:07 AM 01/06/2023   11:58 AM 11/02/2022    3:16 PM 07/28/2022    3:14 PM 05/04/2022    3:36 PM  PHQ 2/9 Scores  PHQ - 2 Score  2 0 1 2 2  0 0  PHQ- 9 Score 3  5 4 2  0 0    Fall Risk     01/12/2024   11:32 AM 11/03/2023    9:59 AM 05/06/2023   10:07 AM 01/06/2023   11:56 AM 11/02/2022    3:11 PM  Fall  Risk   Falls in the past year? 0 0 0 0 0  Number falls in past yr: 0 0 0 0 0  Injury with Fall? 0 0 0 0 0  Risk for fall due to : No Fall Risks No Fall Risks  No Fall Risks   Follow up Falls evaluation completed Falls evaluation completed  Education provided;Falls prevention discussed     MEDICARE RISK AT HOME:  Medicare Risk at Home Any stairs in or around the home?: Yes If so, are there any without handrails?: No Home free of loose throw rugs in walkways, pet beds, electrical cords, etc?: Yes Adequate lighting in your home to reduce risk of falls?: Yes Life alert?: No Use of a cane, walker or w/c?: Yes (USES CANE WHEN OUT OF HOUSE- HAS ROLLATOR) Grab bars in the bathroom?: No Shower chair or bench in shower?: Yes Elevated toilet seat or a handicapped toilet?: Yes  TIMED UP AND GO:  Was the test performed?  Yes  Length of time to ambulate 10 feet: 6 sec Gait slow and steady with assistive device  Cognitive Function: 6CIT completed        01/12/2024   11:35 AM 01/06/2023   12:04 PM 12/04/2019    2:30 PM 07/29/2017    9:42 AM  6CIT Screen  What Year? 0 points 0 points 0 points 0 points  What month? 0 points 0 points 0 points 0 points  What time? 3 points 0 points 0 points 0 points  Count back from 20 0 points 0 points 0 points 0 points  Months in reverse 0 points 0 points 0 points 0 points  Repeat phrase 2 points 0 points 0 points 4 points  Total Score 5 points 0 points 0 points 4 points    Immunizations Immunization History  Administered Date(s) Administered   Fluad Quad(high Dose 65+) 05/30/2019, 06/23/2020, 05/04/2022   Influenza, High Dose Seasonal PF 06/12/2014, 06/09/2015, 07/01/2016, 04/29/2017, 06/28/2018, 05/11/2021   Influenza,inj,Quad PF,6+ Mos 07/23/2013   Influenza-Unspecified 06/15/2023   PFIZER(Purple Top)SARS-COV-2 Vaccination 09/29/2019, 10/20/2019   PNEUMOCOCCAL CONJUGATE-20 11/02/2022   Pneumococcal Conjugate-13 08/13/2014   Pneumococcal  Polysaccharide-23 07/29/2017   Tdap 06/23/2020    Screening Tests Health Maintenance  Topic Date Due   Zoster Vaccines- Shingrix (1 of 2) Never done   COVID-19 Vaccine (3 - Pfizer risk series) 11/17/2019   OPHTHALMOLOGY EXAM  06/22/2022   INFLUENZA VACCINE  03/23/2024   Diabetic kidney evaluation - eGFR measurement  05/05/2024   Diabetic kidney evaluation - Urine ACR  05/05/2024   FOOT EXAM  05/05/2024   HEMOGLOBIN A1C  05/05/2024   Medicare Annual Wellness (AWV)  01/11/2025   DTaP/Tdap/Td (2 - Td or Tdap) 06/23/2030   Pneumonia Vaccine 73+ Years old  Completed   DEXA SCAN  Completed   HPV VACCINES  Aged Out   Meningococcal B Vaccine  Aged Out    Health Maintenance  Health Maintenance Due  Topic Date Due   Zoster Vaccines- Shingrix (1 of 2) Never done   COVID-19 Vaccine (3 - Pfizer risk series) 11/17/2019   OPHTHALMOLOGY EXAM  06/22/2022   Health Maintenance Items Addressed: UP TO  DATE ON EYE EXAM (FAXED FOR REPORT); UP TO DATE TDAP & PNA- DECLINES SHINGRIX & COVID  Additional Screening:  Vision Screening: Recommended annual ophthalmology exams for early detection of glaucoma and other disorders of the eye.  Dental Screening: Recommended annual dental exams for proper oral hygiene  Community Resource Referral / Chronic Care Management: CRR required this visit?  No   CCM required this visit?  No   Plan:    I have personally reviewed and noted the following in the patient's chart:   Medical and social history Use of alcohol, tobacco or illicit drugs  Current medications and supplements including opioid prescriptions. Patient is not currently taking opioid prescriptions. Functional ability and status Nutritional status Physical activity Advanced directives List of other physicians Hospitalizations, surgeries, and ER visits in previous 12 months Vitals Screenings to include cognitive, depression, and falls Referrals and appointments  In addition, I have  reviewed and discussed with patient certain preventive protocols, quality metrics, and best practice recommendations. A written personalized care plan for preventive services as well as general preventive health recommendations were provided to patient.   Pinky Bright, LPN   1/61/0960   After Visit Summary: (In Person-Declined) Patient declined AVS at this time.  Notes: Nothing significant to report at this time.

## 2024-01-17 ENCOUNTER — Other Ambulatory Visit: Payer: Self-pay | Admitting: Internal Medicine

## 2024-01-17 DIAGNOSIS — Z794 Long term (current) use of insulin: Secondary | ICD-10-CM

## 2024-01-20 NOTE — Telephone Encounter (Signed)
 Requested Prescriptions  Pending Prescriptions Disp Refills   BD PEN NEEDLE MINI ULTRAFINE 31G X 5 MM MISC [Pharmacy Med Name: B-D PEN NDL MINI 31GX5MM(3/16)PRPL] 100 each 0    Sig: USE TO INJECT INSULIN      Endocrinology: Diabetes - Testing Supplies Passed - 01/20/2024  8:50 AM      Passed - Valid encounter within last 12 months    Recent Outpatient Visits           1 month ago Cyst of soft tissue   [facility] Rockney Cid, DO   2 months ago Type 2 diabetes mellitus with microalbuminuria, with long-term current use of insulin  [facility])   [place] [facility] Rockney Cid, DO       Future Appointments             In 3 months Rockney Cid, DO [facility]             glucose blood (ONETOUCH ULTRA) test strip [place] Med Name: ONE TOUCH ULTRA BLUE TESTST(NEW)100] 400 strip 0    Sig: USE TO TEST FOUR TIMES DAILY     Endocrinology: Diabetes - Testing Supplies Passed - 01/20/2024  8:50 AM      Passed - Valid encounter within last 12 months    Recent Outpatient Visits           1 month ago Cyst of soft tissue   [facility] Rockney Cid, DO   2 months ago Type 2 diabetes mellitus with microalbuminuria, with long-term current use of insulin  [facility])   [facility] Rockney Cid, DO       Future Appointments             In 3 months Rockney Cid, DO [facility] [address], [facility]

## 2024-02-04 ENCOUNTER — Other Ambulatory Visit: Payer: Self-pay | Admitting: Internal Medicine

## 2024-02-04 DIAGNOSIS — E1169 Type 2 diabetes mellitus with other specified complication: Secondary | ICD-10-CM

## 2024-02-06 NOTE — Telephone Encounter (Signed)
 Requested Prescriptions  Refused Prescriptions Disp Refills   atorvastatin  (LIPITOR) 40 MG tablet [Pharmacy Med Name: ATORVASTATIN  40MG  TABLETS] 90 tablet 1    Sig: TAKE 1 TABLET(40 MG) BY MOUTH DAILY     Cardiovascular:  Antilipid - Statins Failed - 02/06/2024  5:34 PM      Failed - Lipid Panel in normal range within the last 12 months    Cholesterol, Total  Date Value Ref Range Status  12/29/2015 151 100 - 199 mg/dL Final   Cholesterol  Date Value Ref Range Status  05/06/2023 149 <200 mg/dL Final   LDL Cholesterol (Calc)  Date Value Ref Range Status  05/06/2023 79 mg/dL (calc) Final    Comment:    Reference range: <100 . Desirable range <100 mg/dL for primary prevention;   <70 mg/dL for patients with CHD or diabetic patients  with > or = 2 CHD risk factors. Aaron Aas LDL-C is now calculated using the Martin-Hopkins  calculation, which is a validated novel method providing  better accuracy than the Friedewald equation in the  estimation of LDL-C.  Melinda Sprawls et al. Erroll Heard. 0981;191(47): 2061-2068  (http://education.Quest[employer].com/faq/FAQ164)    HDL  Date Value Ref Range Status  05/06/2023 49 (L) > OR = 50 mg/dL Final  82/95/6213 53 >08 mg/dL Final   Triglycerides  Date Value Ref Range Status  05/06/2023 116 <150 mg/dL Final         Passed - Patient is not pregnant      Passed - Valid encounter within last 12 months    Recent Outpatient Visits           2 months ago Cyst of soft tissue   [facility] Rockney Cid, DO   3 months ago Type 2 diabetes mellitus with microalbuminuria, with long-term current use of insulin  [facility])   [facility] Rockney Cid, DO       Future Appointments             In 3 months Rockney Cid, DO [facility], [facility]

## 2024-02-16 DIAGNOSIS — M545 Low back pain, unspecified: Secondary | ICD-10-CM | POA: Diagnosis not present

## 2024-03-01 ENCOUNTER — Ambulatory Visit: Admitting: Podiatry

## 2024-03-05 ENCOUNTER — Ambulatory Visit: Payer: Self-pay

## 2024-03-05 NOTE — Telephone Encounter (Signed)
 FYI Only or Action Required?: FYI only for provider.  Patient was last seen in primary care on 11/22/2023 by Bernardo Fend, DO.  Called Nurse Triage reporting Leg Swelling.  Symptoms began several weeks ago.  Interventions attempted: Prescription medications: pt was on abx from 02/16/24 x 10 days from Ortho.  Symptoms are: stable.  Triage Disposition: See Physician Within 24 Hours  Patient/caregiver understands and will follow disposition?: Yes  Spoke with pt's granddaughter. She states pt still having swelling and one spot weeping on L shin area. Pt has completed abx but still having redness as well. Appt scheduled tomorrow with PCP  Copied from CRM [id number]. Topic: Clinical - Red Word Triage >> Mar 05, 2024 12:29 PM Antonio DEL wrote: Red Word that prompted transfer to Nurse Triage: Swelling in left leg for about 2 weeks, noticed drainage in leg last night. Reason for Disposition  [1] MODERATE leg swelling (e.g., swelling extends up to knees) AND [2] new-onset or getting worse  Answer Assessment - Initial Assessment Questions 1. ONSET: When did the swelling start? (e.g., minutes, hours, days)     2 weeks  2. LOCATION: What part of the leg is swollen?  Are both legs swollen or just one leg?     L leg  3. SEVERITY: How bad is the swelling? (e.g., localized; mild, moderate, severe)     Mild to moderate  4. REDNESS: Is there redness or signs of infection?     Some redness 5. PAIN: Is the swelling painful to touch? If Yes, ask: How painful is it?   (Scale 1-10; mild, moderate or severe)     Some pain  6. FEVER: Do you have a fever? If Yes, ask: What is it, how was it measured, and when did it start?      no 8. MEDICAL HISTORY: Do you have a history of blood clots (e.g., DVT), cancer, heart failure, kidney disease, or liver failure?      10. OTHER SYMPTOMS: Do you have any other symptoms? (e.g., chest pain, difficulty breathing)       Shin area clear  drainage  Ortho gave abx for cellulitis  02/16/24 x 10 days  Protocols used: Leg Swelling and Edema-A-AH

## 2024-03-06 ENCOUNTER — Encounter: Payer: Self-pay | Admitting: Internal Medicine

## 2024-03-06 ENCOUNTER — Other Ambulatory Visit: Payer: Self-pay

## 2024-03-06 ENCOUNTER — Ambulatory Visit (INDEPENDENT_AMBULATORY_CARE_PROVIDER_SITE_OTHER): Admitting: Internal Medicine

## 2024-03-06 VITALS — BP 124/72 | HR 95 | Temp 98.0°F | Resp 16 | Ht 61.5 in | Wt 185.1 lb

## 2024-03-06 DIAGNOSIS — L03116 Cellulitis of left lower limb: Secondary | ICD-10-CM

## 2024-03-06 DIAGNOSIS — R600 Localized edema: Secondary | ICD-10-CM

## 2024-03-06 MED ORDER — FUROSEMIDE 20 MG PO TABS: 10.0000 mg | ORAL_TABLET | Freq: Every day | ORAL | 0 refills | Status: DC

## 2024-03-06 MED ORDER — DOXYCYCLINE HYCLATE 100 MG PO TABS
100.0000 mg | ORAL_TABLET | Freq: Two times a day (BID) | ORAL | 0 refills | Status: AC
Start: 2024-03-06 — End: 2024-03-13

## 2024-03-06 NOTE — Patient Instructions (Addendum)
 It was great seeing you today!  Plan discussed at today's visit: -Take antibiotic twice daily for 7 days -Take water pill half tablet (10 mg) daily in the morning -Elevated legs and try compression stockings -Plan for labs at follow up  Follow up in: 2 weeks   Take care and let us  know if you have any questions or concerns prior to your next visit.  Dr. Bernardo    Peripheral Edema  Peripheral edema is swelling that is caused by a buildup of fluid. Peripheral edema most often affects the lower legs, ankles, and feet. It can also develop in the arms, hands, and face. The area of the body that has peripheral edema will look swollen. It Yohn also feel heavy or warm. Your clothes Sch[place]p start to feel tight. Pressing on the area Novinger make a temporary dent in your skin (pitting edema). You Geimer not be able to move your swollen arm or leg as much as usual. There are many causes of peripheral edema. It can happen because of a complication of other conditions such as heart failure, kidney disease, or a problem with your circulation. It also can be a side effect of certain medicines or happen because of an infection. It often happens to women during pregnancy. Sometimes, the cause is not known. Follow these instructions at home: Managing pain, stiffness, and swelling  Raise (elevate) your legs while you are sitting or lying down. Move around often to prevent stiffness and to reduce swelling. Do not sit or stand for long periods of time. Do not wear tight clothing. Do not wear garters on your upper legs. Exercise your legs to get your circulation going. This helps to move the fluid back into your blood vessels, and it Ihnen help the swelling go down. Wear compression stockings as told by your health care provider. These stockings help to prevent blood clots and reduce swelling in your legs. It is important that these are the correct size. These stockings should be prescribed by your doctor to prevent  possible injuries. If elastic bandages or wraps are recommended, use them as told by your health care provider. Medicines Take over-the-counter and prescription medicines only as told by your health care provider. Your health care provider [place] prescribe medicine to help your body get rid of excess water (diuretic). Take this medicine if you are told to take it. General instructions Eat a low-salt (low-sodium) diet as told by your health care provider. Sometimes, eating less salt Gap[place] reduce swelling. Pay attention to any changes in your symptoms. Moisturize your skin daily to help prevent skin from cracking and draining. Keep all follow-up visits. This is important. Contact a health care provider if: You have a fever. You have swelling in only one leg. You have increased swelling, redness, or pain in one or both of your legs. You have drainage or sores at the area where you have edema. Get help right away if: You have edema that starts suddenly or is getting worse, especially if you are pregnant or have a medical condition. You develop shortness of breath, especially when you are lying down. You have pain in your chest or abdomen. You feel weak. You feel like you will faint. These symptoms Patane be an emergency. Get help right away. Call 911. Do not wait to see if the symptoms will go away. Do not drive yourself to the hospital. Summary Peripheral edema is swelling that is caused by a buildup of fluid. Peripheral edema most often affects  the lower legs, ankles, and feet. Move around often to prevent stiffness and to reduce swelling. Do not sit or stand for long periods of time. Pay attention to any changes in your symptoms. Contact a health care provider if you have edema that starts suddenly or is getting worse, especially if you are pregnant or have a medical condition. Get help right away if you develop shortness of breath, especially when lying down. This information is not intended to  replace advice given to you by your health care provider. Make sure you discuss any questions you have with your health care provider. Document Revised: 04/13/2021 Document Reviewed: 04/13/2021 Elsevier Patient Education  2024 [employer].

## 2024-03-06 NOTE — Progress Notes (Signed)
 Acute Office Visit  Subjective:     Patient ID: Lindsey [name], female    DOB: 10/03/1940, 83 y.o.   MRN: [id number]  Chief Complaint  Patient presents with   Cellulitis    Left leg for 3 days    HPI Patient is in today for left leg skin change.   Discussed the use of AI scribe software for clinical note transcription with the patient, who gave verbal consent to proceed.  History of Present Illness Lindsey [name] is an 83 year old female who presents with recurrent leg swelling and redness.  She experiences persistent redness and pain in her leg, with increased swelling. An x-ray was performed previously, and she completed a ten-day course of Keflex  without significant improvement. An ultrasound showed no abnormalities. There is no history of trauma, but she acknowledges a scratch after the swelling began.  This is the third consecutive occurrence of these symptoms. A similar episode required urgent care, where she was prescribed doxycycline . She is not currently taking any diuretics.  Her current medications include Keflex , taken four times a day during the last episode. She has used doxycycline  in the past for similar symptoms. She has compression stockings at home but has not been using them recently.    Review of Systems  Constitutional:  Negative for chills and fever.  Respiratory:  Negative for shortness of breath.   Cardiovascular:  Positive for leg swelling.  Skin:  Positive for rash.        Objective:    BP 124/72 (Cuff Size: Large)   Pulse 95   Temp 98 F (36.7 C) (Oral)   Resp 16   Ht 5' 1.5 (1.562 m)   Wt 185 lb 1.6 oz (84 kg)   SpO2 96%   BMI 34.41 kg/m  BP Readings from Last 3 Encounters:  03/06/24 124/72  01/12/24 138/68  11/22/23 (!) 140/82   Wt Readings from Last 3 Encounters:  03/06/24 185 lb 1.6 oz (84 kg)  01/12/24 185 lb 8 oz (84.1 kg)  12/01/23 183 lb 11.2 oz (83.3 kg)      Physical Exam Constitutional:      Appearance: Normal  appearance.  HENT:     Head: Normocephalic and atraumatic.  Eyes:     Conjunctiva/sclera: Conjunctivae normal.  Cardiovascular:     Rate and Rhythm: Normal rate and regular rhythm.  Pulmonary:     Effort: Pulmonary effort is normal.     Breath sounds: Normal breath sounds.  Musculoskeletal:     Right lower leg: Edema present.     Left lower leg: Edema present.     Comments: Mild BLE edema  Skin:    General: Skin is warm and dry.     Findings: Rash present.     Comments: Erythema, warm to the touch on left lower anterior shin with 2 cat scratches, no drainage.   Area of swelling in the left groin area - compressible and non-tender, consistent with enlarges superficial vein  Neurological:     General: No focal deficit present.     Mental Status: She is alert. Mental status is at baseline.  Psychiatric:        Mood and Affect: Mood normal.        Behavior: Behavior normal.     No results found for any visits on 03/06/24.      Assessment & Plan:   Assessment & Plan Cellulitis Recurrent leg cellulitis. Doxycycline  chosen for MRSA coverage and anti-inflammatory  effects. - Prescribe doxycycline  100 mg twice daily for 7 days. - Advise against wearing compression stockings until cellulitis resolves. - Educate on leg elevation to reduce swelling. - Instruct to monitor for worsening symptoms or lack of improvement. - Advise on sun protection due to doxycycline  increasing sunburn risk.  Peripheral Edema Chronic leg swelling likely due to lymphedema, contributing to recurrent cellulitis. Lasix  chosen to reduce fluid accumulation with caution for renal impact and hypotension. - Prescribe Lasix  10 mg daily to reduce fluid accumulation. - Advise on leg elevation and reduced salt intake. - Recommend wearing compression stockings after cellulitis resolves. - Educate on monitoring fluid intake and avoiding excessive water consumption. - Caution about potential dizziness or  lightheadedness due to blood pressure changes.  Superficial vein engorgement Engorged superficial vein related to fluid accumulation and venous insufficiency. Improvement expected with lymphedema treatment. - Monitor the area and reassess in two weeks. - Consider referral to a vascular specialist if no improvement.  Follow-up Follow-up required to assess treatment effectiveness for cellulitis and lymphedema, and to monitor renal function due to diuretic use. - Schedule follow-up appointment in two weeks. - Perform laboratory tests to assess renal function at follow-up.  - furosemide  (LASIX ) 20 MG tablet; Take 0.5 tablets (10 mg total) by mouth daily for 14 days.  Dispense: 10 tablet; Refill: 0 - doxycycline  (VIBRA -TABS) 100 MG tablet; Take 1 tablet (100 mg total) by mouth 2 (two) times daily for 7 days.  Dispense: 14 tablet; Refill: 0   Return in about 2 weeks (around 03/20/2024) for can use same day appointment slot is easier.  Sharyle Fischer, DO

## 2024-03-16 ENCOUNTER — Ambulatory Visit
Admission: EM | Admit: 2024-03-16 | Discharge: 2024-03-16 | Disposition: A | Discharge status: Home / Self Care | Attending: Nurse Practitioner | Admitting: Nurse Practitioner

## 2024-03-16 ENCOUNTER — Ambulatory Visit: Payer: Self-pay | Admitting: *Deleted

## 2024-03-16 ENCOUNTER — Ambulatory Visit: Payer: Self-pay

## 2024-03-16 DIAGNOSIS — L03119 Cellulitis of unspecified part of limb: Secondary | ICD-10-CM

## 2024-03-16 MED ORDER — SULFAMETHOXAZOLE-TRIMETHOPRIM 800-160 MG PO TABS
1.0000 | ORAL_TABLET | Freq: Two times a day (BID) | ORAL | 0 refills | Status: AC
Start: 2024-03-16 — End: 2024-03-23

## 2024-03-16 MED ORDER — TRAMADOL HCL 50 MG PO TABS: ORAL_TABLET | ORAL | 0 refills | Status: AC

## 2024-03-16 MED ORDER — AMOXICILLIN-POT CLAVULANATE 875-125 MG PO TABS: 1.0000 | ORAL_TABLET | Freq: Two times a day (BID) | ORAL | 0 refills | Status: DC

## 2024-03-16 NOTE — ED Provider Notes (Signed)
 Lindsey [name]    CSN: [id number] Arrival date & time: 03/16/24  1614      History   Chief Complaint Chief Complaint  Patient presents with   Leg Pain    HPI Lindsey [name] is a 83 y.o. female.   Discussed the use of AI scribe software for clinical note transcription with the patient, who gave verbal consent to proceed.   Patient presents with persistent redness and swelling in the left lower leg that has not improved despite previous antibiotic treatments. The issue has been ongoing for approximately one month. The patient reports that the redness in the affected leg fluctuates, with some days being worse than others. She has already undergone treatment with Doxycycline  and Keflex  without significant improvement. Despite the use of Lasix  prescribed on April 15th, swelling persists. The patient denies any recent trauma to the leg, fever, chills, body aches, nausea, or vomiting. Previous medical interventions include an X-ray and ultrasound performed and unremarkable. The patient has a history of diabetes with last A1C of 7.5%.  The following portions of the patient's history were reviewed and updated as appropriate: allergies, current medications, past family history, past medical history, past social history, past surgical history, and problem list.    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   Bursitis    R knee   Closed fracture of left ulna and radius    Complication of anesthesia    Depression    Diabetes mellitus type 2, controlled, without complications (HCC) 04/25/2015   Diabetes mellitus without complication (HCC)    GERD (gastroesophageal reflux disease)    TUMS   Hyperlipidemia    Hypertension    Hypothyroidism    Osteoporosis    Osteoporosis, 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    PONV (postoperative nausea and vomiting)    Thyroid  disease     Patient Active Problem List   Diagnosis Date Noted   Senile purpura (HCC) 02/19/2022   Primary osteoarthritis of both knees 01/06/2021   Low serum vitamin B12 01/06/2021   Chronic bilateral low back pain with right-sided sciatica 01/06/2021   Type 2 diabetes mellitus with microalbuminuria, with long-term current use of insulin  (HCC) 01/06/2021   Chronic pain syndrome 01/06/2021   Complete fecal incontinence 10/28/2020   History of breast cancer 06/28/2018   Bilateral lower extremity edema 01/16/2016   Osteoporosis, 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   Dyslipidemia associated with type 2 diabetes mellitus (HCC) 04/25/2015   Hyperlipidemia 04/25/2015   Benign hypertension 04/25/2015   Adult hypothyroidism 04/25/2015   At risk for falling 04/25/2015   Major depression, recurrent, chronic (HCC) 04/25/2015   Post menopausal syndrome 04/25/2015    Past Surgical History:  Procedure Laterality Date   BREAST BIOPSY Left 11-04-15   BREAST BIOPSY Left 10-10-07   CATARACT EXTRACTION Bilateral 1990   CHOLECYSTECTOMY     IR KYPHO EA ADDL LEVEL THORACIC OR LUMBAR  12/25/2020   IR KYPHO LUMBAR INC FX REDUCE BONE BX UNI/BIL CANNULATION INC/IMAGING  12/25/2020   IR RADIOLOGIST EVAL & MGMT  12/17/2020   IR VERTEBROPLASTY LUMBAR BX INC UNI/BIL INC/INJECT/IMAGING  12/25/2020   MASTECTOMY Right 1983   Dr RENETTE Schaffer   MASTECTOMY Right 1983   MASTECTOMY W/ SENTINEL NODE BIOPSY Left 12/12/2015   Procedure: MASTECTOMY  WITH SENTINEL LYMPH NODE BIOPSY;  Surgeon: Louanne KANDICE Muse, MD;  Location: [facility];  Service: General;  Laterality: Left;   ORIF ULNAR FRACTURE Left 12/11/2018   Procedure: OPEN REDUCTION INTERNAL FIXATION (ORIF) LEFT PROXIMAL ULNA FRACTURE AND  RADIAL HEAD REPLACEMENT;  Surgeon: Sebastian Lenis, MD;  Location: [facility];  Service: Orthopedics;  Laterality: Left;   RADIAL HEAD ARTHROPLASTY Left 12/11/2018    Procedure: OPEN REDUCTION INTERNAL FIXATION (ORIF) LEFT PROXIMAL ULNA FRACTURE AND  RADIAL HEAD REPLACEMENT;  Surgeon: Sebastian Lenis, MD;  Location: [facility];  Service: Orthopedics;  Laterality: Left;    OB History     Gravida  4   Para  2   Term      Preterm      AB  2   Living         SAB  2   IAB      Ectopic      Multiple      Live Births           Obstetric Comments  1st Menstrual Cycle:  11  1st Pregnancy:  22           Home Medications    Prior to Admission medications   Medication Sig Start Date End Date Taking? Authorizing Provider  amoxicillin -clavulanate (AUGMENTIN ) 875-125 MG tablet Take 1 tablet by mouth every 12 (twelve) hours. 03/16/24  Yes Ocia Simek, FNP  sulfamethoxazole-trimethoprim (BACTRIM DS) 800-160 MG tablet Take 1 tablet by mouth 2 (two) times daily for 7 days. 03/16/24 03/23/24 Yes Iola Lukes, FNP  traMADol (ULTRAM) 50 MG tablet Take 1 tablet (50 mg) by mouth every 4-6 hours as needed to moderate to severe pain. 03/16/24  Yes Iola Lukes, FNP  acetaminophen  (TYLENOL ) 650 MG CR tablet Take 1,300 mg by mouth daily.    [provider]  amLODipine  (NORVASC ) 2.5 MG tablet Take 1 tablet (2.5 mg total) by mouth daily. 05/06/23   Bernardo Fend, DO  atorvastatin  (LIPITOR) 40 MG tablet Take 1 tablet (40 mg total) by mouth daily. 11/03/23   Bernardo Fend, DO  BD PEN NEEDLE MINI ULTRAFINE 31G X 5 MM MISC USE TO INJECT INSULIN  01/20/24   Bernardo Fend, DO  blood glucose meter kit and supplies Dispense based on patient and insurance preference. Use up to four times daily as directed. (FOR ICD-10 E10.9, E11.9). 03/31/20   Sowles, Krichna, MD  cholecalciferol (VITAMIN D ) 1000 UNITS tablet Take by mouth.    [provider]  furosemide  (LASIX ) 20 MG tablet Take 0.5 tablets (10 mg total) by mouth daily for 14 days. 03/06/24 03/20/24  Bernardo Fend, DO  glucose blood [facility] ULTRA) test  strip USE TO TEST FOUR TIMES DAILY 01/20/24   Bernardo Fend, DO  hydrocortisone  cream 1 % Apply 1 application. topically 2 (two) times daily. 12/24/21   Bernardo Fend, DO  insulin  degludec (TRESIBA  FLEXTOUCH) 100 UNIT/ML FlexTouch Pen Inject 10 Units into the skin daily. 11/03/23   Bernardo Fend, DO  ketoconazole  (NIZORAL ) 2 % cream Apply topically 2 (two) times daily. 08/12/20   Sowles, Krichna, MD  Lancets [facility] DELICA PLUS LANCET30G) MISC USE TO TEST UP TO FOUR TIMES A DAY AS DIRECTED 11/03/23   Bernardo Fend, DO  levothyroxine  (SYNTHROID ) 112 MCG tablet Take 1 tablet (112 mcg total) by mouth daily before breakfast. 11/03/23   Bernardo Fend, DO  lisinopril  (ZESTRIL ) 40 MG tablet Take 1 tablet (40 mg total) by mouth at bedtime. 11/03/23  Bernardo Fend, DO  sertraline  (ZOLOFT ) 50 MG tablet Take 1 tablet (50 mg total) by mouth daily. 11/03/23   Bernardo Fend, DO    Family History Family History  Problem Relation Age of Onset   Kidney disease Mother    Heart disease Father    Stroke Sister    Cancer Sister        breast   Breast cancer Sister    Stroke Brother    Diabetes Brother    Hypertension Brother    Cancer Daughter 54       DCIS/lumpectomy/genetic negative   Breast cancer Daughter     Social History Social History   Tobacco Use   Smoking status: Never   Smokeless tobacco: Never   Tobacco comments:    Non-smoker. Smoking cessation materials contraindicated  Vaping Use   Vaping status: Never Used  Substance Use Topics   Alcohol use: No    Alcohol/week: 0.0 standard drinks of alcohol   Drug use: No     Allergies   Patient has no known allergies.   Review of Systems Review of Systems  Cardiovascular:  Positive for leg swelling.  Skin:  Negative for rash and wound.  Neurological:  Negative for weakness and numbness.  All other systems reviewed and are negative.    Physical Exam Triage Vital Signs ED Triage Vitals  Encounter  Vitals Group     BP 03/16/24 1634 (!) 177/76     Girls Systolic BP Percentile --      Girls Diastolic BP Percentile --      Boys Systolic BP Percentile --      Boys Diastolic BP Percentile --      Pulse Rate 03/16/24 1634 68     Resp 03/16/24 1634 18     Temp 03/16/24 1634 98.6 F (37 C)     Temp src --      SpO2 03/16/24 1634 98 %     Weight --      Height --      Head Circumference --      Peak Flow --      Pain Score 03/16/24 1624 6     Pain Loc --      Pain Education --      Exclude from Growth Chart --    No data found.  Updated Vital Signs BP (!) 177/76   Pulse 68   Temp 98.6 F (37 C)   Resp 18   SpO2 98%   Visual Acuity Right Eye Distance:   Left Eye Distance:   Bilateral Distance:    Right Eye Near:   Left Eye Near:    Bilateral Near:     Physical Exam Vitals reviewed.  Constitutional:      General: She is awake. She is not in acute distress.    Appearance: Normal appearance. She is well-developed. She is not ill-appearing, toxic-appearing or diaphoretic.  HENT:     Head: Normocephalic.     Right Ear: Hearing normal.     Left Ear: Hearing normal.     Nose: Nose normal.     Mouth/Throat:     Mouth: Mucous membranes are moist.  Eyes:     General: Vision grossly intact.     Conjunctiva/sclera: Conjunctivae normal.  Cardiovascular:     Rate and Rhythm: Normal rate and regular rhythm.     Heart sounds: Normal heart sounds.  Pulmonary:     Effort: Pulmonary effort is normal.  Breath sounds: Normal breath sounds and air entry.  Musculoskeletal:        General: Normal range of motion.     Cervical back: Full passive range of motion without pain, normal range of motion and neck supple.     Right lower leg: Edema present.     Left lower leg: Edema present.  Skin:    General: Skin is warm and dry.     Findings: Erythema present.     Comments: Erythema noted to the left lower leg. No significant warmth. No open areas or drainage noted.    Neurological:     General: No focal deficit present.     Mental Status: She is alert and oriented to person, place, and time.  Psychiatric:        Speech: Speech normal.        Behavior: Behavior is cooperative.      UC Treatments / Results  Labs (all labs ordered are listed, but only abnormal results are displayed) Labs Reviewed - No data to display  EKG   Radiology No results found.  Procedures Procedures (including critical care time)  Medications Ordered in UC Medications - No data to display  Initial Impression / Assessment and Plan / UC Course  I have reviewed the triage vital signs and the nursing notes.  Pertinent labs & imaging results that were available during my care of the patient were reviewed by me and considered in my medical decision making (see chart for details).     Patient presents with persistent cellulitis of the left leg ongoing for approximately one month, with incomplete response to prior courses of doxycycline  and cephalexin . Symptoms include fluctuating redness and swelling without associated trauma. X-ray and ultrasound imaging revealed no acute abnormalities. Kidney function is normal. The patient denies systemic symptoms such as fever, chills, body aches, nausea, or vomiting. The partial response to previous antibiotics suggests a polymicrobial infection involving both gram-positive and possibly anaerobic organisms. Current management includes initiating combination therapy with Bactrim for MRSA coverage and Augmentin  for anaerobic and streptococcal coverage. Patient was instructed to stagger the medications and take them with food to minimize gastrointestinal upset and to begin a daily oral probiotic to support gut health during treatment. Lasix  will be continued for lower extremity edema, and patient was advised to elevate the affected leg frequently and avoid compression stockings until the cellulitis resolves. Dietary salt intake should be  limited to help control swelling. Tramadol was continued for pain and dosing [facility] be increased to every 4-6 hours as needed, with monitoring for side effects such as dizziness and fall risk; 10 tablets were provided today due to low remaining supply. Patient was counseled to seek immediate medical attention if symptoms worsen or new signs such as fever, nausea, vomiting, or increased pain develop. If there is no improvement with this treatment course, referral to infectious disease will be considered through the patient's PCP.  Today's evaluation has revealed no signs of a dangerous process. Discussed diagnosis with patient and/or guardian. Patient and/or guardian aware of their diagnosis, possible red flag symptoms to watch out for and need for close follow up. Patient and/or guardian understands verbal and written discharge instructions. Patient and/or guardian comfortable with plan and disposition.  Patient and/or guardian has a clear mental status at this time, good insight into illness (after discussion and teaching) and has clear judgment to make decisions regarding their care  Documentation was completed with the aid of voice recognition software. Transcription [place]  contain typographical errors. Final Clinical Impressions(s) / UC Diagnoses   Final diagnoses:  Recurrent cellulitis of lower extremity     Discharge Instructions      You have been diagnosed with persistent cellulitis of the left leg that has not fully responded to previous antibiotics. You have been prescribed a combination of Bactrim and Augmentin  to target a broader range of bacteria, including MRSA, strep, and anaerobic organisms. Take both medications as directed, stagger the doses, and take them with food to help reduce stomach upset. Begin a daily oral probiotic to support gut health while on antibiotics.  Continue taking Lasix  as prescribed to help reduce leg swelling. Elevate your leg as much as possible during the day, and  avoid wearing compression stockings until the infection has fully resolved. Limit your salt intake to help manage fluid retention. You [place] continue using tramadol for pain and [place] take it every 4-6 hours as needed. Monitor for side effects such as dizziness or drowsiness, and use caution to prevent falls. You were given 10 additional tablets today due to low supply.  Follow up with your primary care provider if there is no improvement or if symptoms worsen. A referral to infectious disease [place] be needed if this treatment is not effective. Go to the emergency room if you experience increased redness, swelling, or pain; develop a fever, chills, nausea, or vomiting; or if you notice any signs of rapid worsening.      ED Prescriptions     Medication Sig Dispense Auth. Provider   sulfamethoxazole-trimethoprim (BACTRIM DS) 800-160 MG tablet Take 1 tablet by mouth 2 (two) times daily for 7 days. 14 tablet Iola Lukes, FNP   amoxicillin -clavulanate (AUGMENTIN ) 875-125 MG tablet Take 1 tablet by mouth every 12 (twelve) hours. 14 tablet Iola Lukes, FNP   traMADol (ULTRAM) 50 MG tablet Take 1 tablet (50 mg) by mouth every 4-6 hours as needed to moderate to severe pain. 10 tablet Iola Lukes, FNP      I have reviewed the PDMP during this encounter.   Iola Lukes, [place] 03/16/24 [id number]

## 2024-03-16 NOTE — Discharge Instructions (Signed)
 You have been diagnosed with persistent cellulitis of the left leg that has not fully responded to previous antibiotics. You have been prescribed a combination of Bactrim and Augmentin  to target a broader range of bacteria, including MRSA, strep, and anaerobic organisms. Take both medications as directed, stagger the doses, and take them with food to help reduce stomach upset. Begin a daily oral probiotic to support gut health while on antibiotics.  Continue taking Lasix  as prescribed to help reduce leg swelling. Elevate your leg as much as possible during the day, and avoid wearing compression stockings until the infection has fully resolved. Limit your salt intake to help manage fluid retention. You [place] continue using tramadol for pain and [place] take it every 4-6 hours as needed. Monitor for side effects such as dizziness or drowsiness, and use caution to prevent falls. You were given 10 additional tablets today due to low supply.  Follow up with your primary care provider if there is no improvement or if symptoms worsen. A referral to infectious disease [place] be needed if this treatment is not effective. Go to the emergency room if you experience increased redness, swelling, or pain; develop a fever, chills, nausea, or vomiting; or if you notice any signs of rapid worsening.

## 2024-03-16 NOTE — Telephone Encounter (Signed)
 FYI Only or Action Required?: FYI only for provider.  Patient was last seen in primary care on 03/06/2024 by Bernardo Fend, DO.  Called Nurse Triage reporting Leg Swelling.  Symptoms began several days ago.  Interventions attempted: Other: current treatment for cellulitis- finished antibiotic this week- still having pain and redness.  Symptoms are: gradually worsening.  Triage Disposition: See HCP Within 4 Hours (Or PCP Triage)  Patient/caregiver understands and will follow disposition?:yes- UC advised      [name], Lindsey L (Patient)   Subject  [name], Lindsey L (Patient)   Topic  Clinical - Red Word Triage    Communication  Red Word that prompted transfer to Nurse Triage: pt has a swollen left leg as well as pain   Reason for Disposition  [1] Red area or streak [2] large (> 2 inches or 5 cm)  Answer Assessment - Initial Assessment Questions 1. ONSET: When did the swelling start? (e.g., minutes, hours, days)     Chronic- pain started at [facility] 03/06/24 2. LOCATION: What part of the leg is swollen?  Are both legs swollen or just one leg?     Left leg 3. SEVERITY: How bad is the swelling? (e.g., localized; mild, moderate, severe)     Up to the knee 4. REDNESS: Is there redness or signs of infection?     Yes- foot- half of lower leg 5. PAIN: Is the swelling painful to touch? If Yes, ask: How painful is it?   (Scale 1-10; mild, moderate or severe)     Painful to touch, 7-8/10, elevation helps 6. FEVER: Do you have a fever? If Yes, ask: What is it, how was it measured, and when did it start?      no 7. CAUSE: What do you think is causing the leg swelling?     Cellulitis  8. MEDICAL HISTORY: Do you have a history of blood clots (e.g., DVT), cancer, heart failure, kidney disease, or liver failure?     Patient finished antibiotic Wednesday- seemed to help a little 9. RECURRENT SYMPTOM: Have you had leg swelling before? If Yes, ask: When was the last time?  What happened that time?     no 10. OTHER SYMPTOMS: Do you have any other symptoms? (e.g., chest pain, difficulty breathing)       no  Protocols used: Leg Swelling and Edema-A-AH

## 2024-03-16 NOTE — ED Triage Notes (Addendum)
 Patient to Urgent Care with complaints of left sided leg redness/ pain/ swelling. Area warm to the area. Pain worse w/ ambulation. Denies any known fevers. Started with a cat scratch.   Finished doxycycline  Wednesday. Little improvement. Symptoms started 3-4 weeks ago

## 2024-03-23 ENCOUNTER — Other Ambulatory Visit (HOSPITAL_COMMUNITY): Payer: Self-pay

## 2024-03-23 ENCOUNTER — Encounter (HOSPITAL_BASED_OUTPATIENT_CLINIC_OR_DEPARTMENT_OTHER): Payer: Self-pay

## 2024-03-23 ENCOUNTER — Emergency Department (HOSPITAL_BASED_OUTPATIENT_CLINIC_OR_DEPARTMENT_OTHER)

## 2024-03-23 ENCOUNTER — Emergency Department (HOSPITAL_BASED_OUTPATIENT_CLINIC_OR_DEPARTMENT_OTHER)
Admission: EM | Admit: 2024-03-23 | Discharge: 2024-03-23 | Disposition: A | Discharge status: Home / Self Care | Attending: Emergency Medicine | Admitting: Emergency Medicine

## 2024-03-23 ENCOUNTER — Other Ambulatory Visit: Payer: Self-pay

## 2024-03-23 DIAGNOSIS — M7989 Other specified soft tissue disorders: Secondary | ICD-10-CM | POA: Insufficient documentation

## 2024-03-23 DIAGNOSIS — M79605 Pain in left leg: Secondary | ICD-10-CM | POA: Insufficient documentation

## 2024-03-23 DIAGNOSIS — R600 Localized edema: Secondary | ICD-10-CM | POA: Diagnosis not present

## 2024-03-23 DIAGNOSIS — Z79899 Other long term (current) drug therapy: Secondary | ICD-10-CM | POA: Insufficient documentation

## 2024-03-23 LAB — CBC WITH DIFFERENTIAL/PLATELET
Abs Immature Granulocytes: 0.01 K/uL (ref 0.00–0.07)
Basophils Absolute: 0 K/uL (ref 0.0–0.1)
Basophils Relative: 1 %
Eosinophils Absolute: 0.2 K/uL (ref 0.0–0.5)
Eosinophils Relative: 4 %
HCT: 37.5 % (ref 36.0–46.0)
Hemoglobin: 11.9 g/dL — ABNORMAL LOW (ref 12.0–15.0)
Immature Granulocytes: 0 %
Lymphocytes Relative: 21 %
Lymphs Abs: 0.9 K/uL (ref 0.7–4.0)
MCH: 28.5 pg (ref 26.0–34.0)
MCHC: 31.7 g/dL (ref 30.0–36.0)
MCV: 89.7 fL (ref 80.0–100.0)
Monocytes Absolute: 0.4 K/uL (ref 0.1–1.0)
Monocytes Relative: 9 %
Neutro Abs: 2.7 K/uL (ref 1.7–7.7)
Neutrophils Relative %: 65 %
Platelets: 237 K/uL (ref 150–400)
RBC: 4.18 MIL/uL (ref 3.87–5.11)
RDW: 14.1 % (ref 11.5–15.5)
WBC: 4.1 K/uL (ref 4.0–10.5)
nRBC: 0 % (ref 0.0–0.2)

## 2024-03-23 LAB — BASIC METABOLIC PANEL WITH GFR
Anion gap: 12 (ref 5–15)
BUN: 19 mg/dL (ref 8–23)
CO2: 25 mmol/L (ref 22–32)
Calcium: 9.6 mg/dL (ref 8.9–10.3)
Chloride: 104 mmol/L (ref 98–111)
Creatinine, Ser: 1.14 mg/dL — ABNORMAL HIGH (ref 0.44–1.00)
GFR, Estimated: 47 mL/min — ABNORMAL LOW (ref >60)
Glucose, Bld: 168 mg/dL — ABNORMAL HIGH (ref 70–99)
Potassium: 4.4 mmol/L (ref 3.5–5.1)
Sodium: 141 mmol/L (ref 135–145)

## 2024-03-23 MED ORDER — OXYCODONE HCL 5 MG PO TABS
5.0000 mg | ORAL_TABLET | Freq: Four times a day (QID) | ORAL | 0 refills | Status: AC | PRN
  Filled 2024-03-23: qty 12, 3d supply, fill #0

## 2024-03-23 MED ORDER — POTASSIUM CHLORIDE ER 10 MEQ PO TBCR
10.0000 meq | EXTENDED_RELEASE_TABLET | Freq: Every day | ORAL | 0 refills | Status: AC
  Filled 2024-03-23: qty 7, 7d supply, fill #0

## 2024-03-23 MED ORDER — FUROSEMIDE 20 MG PO TABS
20.0000 mg | ORAL_TABLET | Freq: Every day | ORAL | 0 refills | Status: DC
  Filled 2024-03-23: qty 7, 7d supply, fill #0

## 2024-03-23 NOTE — ED Provider Notes (Signed)
 [place] EMERGENCY DEPARTMENT AT [facility] Provider Note   CSN: [id number] Arrival date & time: 03/23/24  1132     Patient presents with: Cellulitis   Lindsey [name] is a 83 y.o. female here with LL extremity redness, swelling and pain x 1 month.  The patient has completed 4 courses of antibiotics, including doxycycline , keflex , and recently this week (started 7/25) Augmentin  + Bactrim . No change in redness. No fevers or chills.   HPI     Prior to Admission medications   Medication Sig Start Date End Date Taking? Authorizing Provider  furosemide  (LASIX ) 20 MG tablet Take 1 tablet (20 mg total) by mouth daily for 7 days. 03/23/24 03/30/24 Yes Wynne Rozak, Donnice PARAS, MD  oxyCODONE  (ROXICODONE ) 5 MG immediate release tablet Take 1 tablet (5 mg total) by mouth every 6 (six) hours as needed for up to 12 doses for severe pain (pain score 7-10). 03/23/24  Yes Karolynn Infantino, Donnice PARAS, MD  potassium chloride  (KLOR-CON ) 10 MEQ tablet Take 1 tablet (10 mEq total) by mouth daily for 7 days. 03/23/24 03/30/24 Yes Roseanna Koplin, Donnice PARAS, MD  acetaminophen  (TYLENOL ) 650 MG CR tablet Take 1,300 mg by mouth daily.    [provider]  amLODipine  (NORVASC ) 2.5 MG tablet Take 1 tablet (2.5 mg total) by mouth daily. 05/06/23   Bernardo Fend, DO  amoxicillin -clavulanate (AUGMENTIN ) 875-125 MG tablet Take 1 tablet by mouth every 12 (twelve) hours. 03/16/24   Iola Lukes, FNP  atorvastatin  (LIPITOR) 40 MG tablet Take 1 tablet (40 mg total) by mouth daily. 11/03/23   Bernardo Fend, DO  BD PEN NEEDLE MINI ULTRAFINE 31G X 5 MM MISC USE TO INJECT INSULIN  01/20/24   Bernardo Fend, DO  blood glucose meter kit and supplies Dispense based on patient and insurance preference. Use up to four times daily as directed. (FOR ICD-10 E10.9, E11.9). 03/31/20   Sowles, Krichna, MD  cholecalciferol (VITAMIN D ) 1000 UNITS tablet Take by mouth.    [provider]  furosemide  (LASIX ) 20 MG tablet Take 0.5 tablets  (10 mg total) by mouth daily for 14 days. 03/06/24 03/20/24  Bernardo Fend, DO  glucose blood [place] ULTRA) test strip USE TO TEST FOUR TIMES DAILY 01/20/24   Bernardo Fend, DO  hydrocortisone  cream 1 % Apply 1 application. topically 2 (two) times daily. 12/24/21   Bernardo Fend, DO  insulin  degludec (TRESIBA  FLEXTOUCH) 100 UNIT/ML FlexTouch Pen Inject 10 Units into the skin daily. 11/03/23   Bernardo Fend, DO  ketoconazole  (NIZORAL ) 2 % cream Apply topically 2 (two) times daily. 08/12/20   Sowles, Krichna, MD  Lancets [facility] DELICA PLUS LANCET30G) MISC USE TO TEST UP TO FOUR TIMES A DAY AS DIRECTED 11/03/23   Bernardo Fend, DO  levothyroxine  (SYNTHROID ) 112 MCG tablet Take 1 tablet (112 mcg total) by mouth daily before breakfast. 11/03/23   Bernardo Fend, DO  lisinopril  (ZESTRIL ) 40 MG tablet Take 1 tablet (40 mg total) by mouth at bedtime. 11/03/23   Bernardo Fend, DO  sertraline  (ZOLOFT ) 50 MG tablet Take 1 tablet (50 mg total) by mouth daily. 11/03/23   Bernardo Fend, DO  sulfamethoxazole -trimethoprim  (BACTRIM  DS) 800-160 MG tablet Take 1 tablet by mouth 2 (two) times daily for 7 days. 03/16/24 03/23/24  Murrill, Samantha, FNP  traMADol  (ULTRAM ) 50 MG tablet Take 1 tablet (50 mg) by mouth every 4-6 hours as needed to moderate to severe pain. 03/16/24   Murrill, Samantha, FNP    Allergies: Patient has no allergy information on record.  Review of Systems  Updated Vital Signs BP 134/78   Pulse 66   Temp 98.5 F (36.9 C)   Resp 18   SpO2 100%   Physical Exam Constitutional:      General: She is not in acute distress. HENT:     Head: Normocephalic and atraumatic.  Eyes:     Conjunctiva/sclera: Conjunctivae normal.     Pupils: Pupils are equal, round, and reactive to light.  Cardiovascular:     Rate and Rhythm: Normal rate and regular rhythm.  Pulmonary:     Effort: Pulmonary effort is normal. No respiratory distress.  Abdominal:     General:  There is no distension.     Tenderness: There is no abdominal tenderness.  Musculoskeletal:     Right lower leg: Edema present.     Left lower leg: Edema present.     Comments: Erythema with mild warmth of left lower extremity  Skin:    General: Skin is warm and dry.  Neurological:     General: No focal deficit present.     Mental Status: She is alert. Mental status is at baseline.  Psychiatric:        Mood and Affect: Mood normal.        Behavior: Behavior normal.     (all labs ordered are listed, but only abnormal results are displayed) Labs Reviewed  BASIC METABOLIC PANEL WITH GFR - Abnormal; Notable for the following components:      Result Value   Glucose, Bld 168 (*)    Creatinine, Ser 1.14 (*)    GFR, Estimated 47 (*)    All other components within normal limits  CBC WITH DIFFERENTIAL/PLATELET - Abnormal; Notable for the following components:   Hemoglobin 11.9 (*)    All other components within normal limits    EKG: None  Radiology: US  Venous Img Lower Unilateral Left Result Date: 03/23/2024 CLINICAL DATA:  Left lower extremity edema. EXAM: LEFT LOWER EXTREMITY VENOUS DOPPLER ULTRASOUND TECHNIQUE: Gray-scale sonography with graded compression, as well as color Doppler and duplex ultrasound were performed to evaluate the lower extremity deep venous systems from the level of the common femoral vein and including the common femoral, femoral, profunda femoral, popliteal and calf veins including the posterior tibial, peroneal and gastrocnemius veins when visible. The superficial great saphenous vein was also interrogated. Spectral Doppler was utilized to evaluate flow at rest and with distal augmentation maneuvers in the common femoral, femoral and popliteal veins. COMPARISON:  None Available. FINDINGS: Contralateral Common Femoral Vein: Respiratory phasicity is normal and symmetric with the symptomatic side. No evidence of thrombus. Normal compressibility. Common Femoral Vein: No  evidence of thrombus. Normal compressibility, respiratory phasicity and response to augmentation. Saphenofemoral Junction: No evidence of thrombus. Normal compressibility and flow on color Doppler imaging. Profunda Femoral Vein: No evidence of thrombus. Normal compressibility and flow on color Doppler imaging. Femoral Vein: No evidence of thrombus. Normal compressibility, respiratory phasicity and response to augmentation. Popliteal Vein: No evidence of thrombus. Normal compressibility, respiratory phasicity and response to augmentation. Calf Veins: No evidence of thrombus. Normal compressibility and flow on color Doppler imaging. Superficial Great Saphenous Vein: No evidence of thrombus. Normal compressibility. Venous Reflux:  None. Other Findings: No evidence of superficial thrombophlebitis or abnormal fluid collection. IMPRESSION: No evidence of left lower extremity deep venous thrombosis. Electronically Signed   By: Marcey Moan M.D.   On: 03/23/2024 13:12     Procedures   Medications Ordered in the ED - No data  to display                                  Medical Decision Making Amount and/or Complexity of Data Reviewed Labs: ordered.  Risk Prescription drug management.   LLE erythema Ddx includes DVT vs venous stasis dermatitis vs other  Less likely ongoing infection after 4 courses of antibiotics in difference classes.  NO systemic signs of sepsis.  Vascular ultrasound reviewed, showing no acute DVT Labs with normal WBC  I do not think this is an infection.  This [place]man be lymphedema.  We can try lasix  with K+ for 7 days, and I advised PCP follow up     Final diagnoses:  Left leg pain    ED Discharge Orders          Ordered    oxyCODONE  (ROXICODONE ) 5 MG immediate release tablet  Every 6 hours PRN        03/23/24 1339    furosemide  (LASIX ) 20 MG tablet  Daily        03/23/24 1339    potassium chloride  (KLOR-CON ) 10 MEQ tablet  Daily        03/23/24 1339                Cottie Donnice PARAS, MD 03/23/24 1530

## 2024-03-23 NOTE — ED Triage Notes (Signed)
 Pt c/o L leg cellulitis- redness, swelling x17mo. Recently seen for same, treated w lots of abx but no help.

## 2024-03-23 NOTE — Discharge Instructions (Signed)
 Do NOT take tramadol  and oxycodone  together.  These are both opioid narcotics.  You should alternate one or the other every 6 hours, as needed - using them as sparingly as possible.

## 2024-03-23 NOTE — ED Notes (Signed)
 Pt d/c instructions, medications, and follow-up care reviewed with pt and family. Pt and family verbalized understanding and had no further questions at time of d/c. Pt CA&Ox4, ambulatory w/ walker, and in NAD at time of d/c

## 2024-03-27 ENCOUNTER — Other Ambulatory Visit: Payer: Self-pay

## 2024-03-27 ENCOUNTER — Encounter: Payer: Self-pay | Admitting: Internal Medicine

## 2024-03-27 ENCOUNTER — Ambulatory Visit (INDEPENDENT_AMBULATORY_CARE_PROVIDER_SITE_OTHER): Admitting: Internal Medicine

## 2024-03-27 VITALS — BP 128/76 | HR 74 | Temp 98.1°F | Resp 16 | Ht 61.5 in

## 2024-03-27 DIAGNOSIS — L03116 Cellulitis of left lower limb: Secondary | ICD-10-CM

## 2024-03-27 DIAGNOSIS — R600 Localized edema: Secondary | ICD-10-CM

## 2024-03-27 MED ORDER — FUROSEMIDE 20 MG PO TABS: 20.0000 mg | ORAL_TABLET | Freq: Every day | ORAL | 0 refills | Status: DC

## 2024-03-27 MED ORDER — DOXYCYCLINE HYCLATE 100 MG PO TABS: 100.0000 mg | ORAL_TABLET | Freq: Two times a day (BID) | ORAL | 0 refills | Status: AC

## 2024-03-27 NOTE — Progress Notes (Signed)
 Acute Office Visit  Subjective:     Patient ID: Lindsey [name], female    DOB: 06/21/1941, 83 y.o.   MRN: [id number]  Chief Complaint  Patient presents with   Cellulitis    2 week recheck, seen in ER    HPI Patient is in today for left leg skin change.   Discussed the use of AI scribe software for clinical note transcription with the patient, who gave verbal consent to proceed.  History of Present Illness  Lindsey [name] is an 83 year old female who presents with persistent leg pain and swelling.  She experiences persistent burning and throbbing pain with swelling in her leg, unresponsive to multiple antibiotics including doxycycline  and Augmentin . Redness is worsening, and the pain is not localized. An ultrasound ruled out a blood clot. She completed her antibiotics last weekend with no improvement. She was previously on Lasix  10 mg and it was increased to 20 mg last week. Her blood sugar levels are elevated, with readings in the 200s, without dietary changes. There are no wounds or skin breaks on her foot, and no symptoms such as burning in the foot.   Review of Systems  Constitutional:  Negative for chills and fever.  Respiratory:  Negative for shortness of breath.   Cardiovascular:  Positive for leg swelling.  Skin:  Positive for rash.        Objective:    BP 128/76 (Cuff Size: Large)   Pulse 74   Temp 98.1 F (36.7 C) (Oral)   Resp 16   Ht 5' 1.5 (1.562 m)   SpO2 98%   BMI 34.41 kg/m  BP Readings from Last 3 Encounters:  03/27/24 128/76  03/23/24 134/78  03/16/24 (!) 177/76   Wt Readings from Last 3 Encounters:  03/06/24 185 lb 1.6 oz (84 kg)  01/12/24 185 lb 8 oz (84.1 kg)  12/01/23 183 lb 11.2 oz (83.3 kg)      Physical Exam Constitutional:      Appearance: Normal appearance.  HENT:     Head: Normocephalic and atraumatic.  Eyes:     Conjunctiva/sclera: Conjunctivae normal.  Cardiovascular:     Rate and Rhythm: Normal rate and regular rhythm.   Pulmonary:     Effort: Pulmonary effort is normal.     Breath sounds: Normal breath sounds.  Musculoskeletal:     Right lower leg: Edema present.     Left lower leg: Edema present.     Comments: Mild BLE edema but improved since last visit  Skin:    General: Skin is warm and dry.     Findings: Rash present.     Comments: Erythema, warm to the touch slightly worse compared to last visit. See picture below.  Neurological:     General: No focal deficit present.     Mental Status: She is alert. Mental status is at baseline.  Psychiatric:        Mood and Affect: Mood normal.        Behavior: Behavior normal.     No results found for any visits on 03/27/24.      Assessment & Plan:   Assessment & Plan Lower extremity edema and possible cellulitis Persistent lower extremity edema with worsening redness and pain, suggesting possible cellulitis but primarily fluid retention. Ultrasound ruled out DVT. Previous antibiotics ineffective. Diuretics pose kidney function risks. - Prescribed doxycycline  for 14 days. - Prescribed Lasix  20 mg daily for 14 days. - Instructed to elevate legs and  monitor sodium intake. - Advised against excessive water intake. - Marked area of redness with a skin marker to monitor changes. - Scheduled follow-up in two weeks for reassessment and lab tests.  - doxycycline  (VIBRA -TABS) 100 MG tablet; Take 1 tablet (100 mg total) by mouth 2 (two) times daily for 14 days.  Dispense: 28 tablet; Refill: 0 - furosemide  (LASIX ) 20 MG tablet; Take 1 tablet (20 mg total) by mouth daily for 14 days.  Dispense: 14 tablet; Refill: 0   Return in about 2 weeks (around 04/10/2024).  Sharyle Fischer, DO

## 2024-04-02 ENCOUNTER — Encounter: Payer: Self-pay | Admitting: Podiatry

## 2024-04-02 ENCOUNTER — Ambulatory Visit: Admitting: Podiatry

## 2024-04-02 DIAGNOSIS — B351 Tinea unguium: Secondary | ICD-10-CM | POA: Diagnosis not present

## 2024-04-02 DIAGNOSIS — E1129 Type 2 diabetes mellitus with other diabetic kidney complication: Secondary | ICD-10-CM | POA: Diagnosis not present

## 2024-04-02 DIAGNOSIS — M79674 Pain in right toe(s): Secondary | ICD-10-CM

## 2024-04-02 DIAGNOSIS — Z794 Long term (current) use of insulin: Secondary | ICD-10-CM

## 2024-04-02 DIAGNOSIS — R809 Proteinuria, unspecified: Secondary | ICD-10-CM | POA: Diagnosis not present

## 2024-04-02 DIAGNOSIS — M79675 Pain in left toe(s): Secondary | ICD-10-CM | POA: Diagnosis not present

## 2024-04-02 NOTE — Progress Notes (Signed)

## 2024-04-07 ENCOUNTER — Other Ambulatory Visit: Payer: Self-pay | Admitting: Internal Medicine

## 2024-04-07 DIAGNOSIS — E1129 Type 2 diabetes mellitus with other diabetic kidney complication: Secondary | ICD-10-CM

## 2024-04-10 NOTE — Telephone Encounter (Signed)
 Second request

## 2024-04-10 NOTE — Telephone Encounter (Signed)
 Requested Prescriptions  Pending Prescriptions Disp Refills   BD PEN NEEDLE MINI ULTRAFINE 31G X 5 MM MISC [Pharmacy Med Name: B-D PEN NDL MINI 31GX5MM(3/16)PRPL] 100 each 0    Sig: USE TO INJECT INSULIN  EVERY DAY     Endocrinology: Diabetes - Testing Supplies Passed - 04/10/2024  2:50 PM      Passed - Valid encounter within last 12 months    Recent Outpatient Visits           2 weeks ago Peripheral edema   [facility] Bernardo Fend, DO   1 month ago Peripheral edema   [facility] Bernardo Fend, DO   4 months ago Cyst of soft tissue   [facility] Bernardo Fend, DO   5 months ago Type 2 diabetes mellitus with microalbuminuria, with long-term current use of insulin  [facility])   [facility] Bernardo Fend, DO       Future Appointments             Tomorrow Bernardo Fend, DO [facility]   In 3 weeks Bernardo Fend, DO [facility]

## 2024-04-11 ENCOUNTER — Other Ambulatory Visit: Payer: Self-pay

## 2024-04-11 ENCOUNTER — Ambulatory Visit (INDEPENDENT_AMBULATORY_CARE_PROVIDER_SITE_OTHER): Admitting: Internal Medicine

## 2024-04-11 VITALS — BP 132/80 | HR 79 | Temp 98.5°F | Resp 16

## 2024-04-11 DIAGNOSIS — L03116 Cellulitis of left lower limb: Secondary | ICD-10-CM | POA: Diagnosis not present

## 2024-04-11 DIAGNOSIS — R600 Localized edema: Secondary | ICD-10-CM

## 2024-04-11 MED ORDER — DOXYCYCLINE HYCLATE 100 MG PO TABS: 100.0000 mg | ORAL_TABLET | Freq: Two times a day (BID) | ORAL | 0 refills | Status: DC

## 2024-04-11 NOTE — Progress Notes (Signed)
 Acute Office Visit  Subjective:     Patient ID: Lindsey [name], female    DOB: [birth year], 83 y.o.   MRN: [id number]  Chief Complaint  Patient presents with   Follow-up    2 week recheck left leg cellulitis    HPI Patient is in today for left leg skin change.   Discussed the use of AI scribe software for clinical note transcription with the patient, who gave verbal consent to proceed.  History of Present Illness  Lindsey [name] is an 83 year old female who presents with leg pain and redness. She is here with her grand-daughter today.   She experiences leg pain and redness primarily in the calf area, which has been improving with treatment. The pain persists but is less severe, and the redness has significantly diminished, though some remains. The pain is localized to the calf rather than the ankle.  She is currently taking doxycycline  and Lasix , which she believes are contributing to her improvement. She has not experienced any gastrointestinal side effects from the doxycycline .  No fevers, rashes, or pain elsewhere. She denies shortness of breath and coughing.   Review of Systems  Constitutional:  Negative for chills and fever.  Respiratory:  Negative for shortness of breath.   Cardiovascular:  Positive for leg swelling.  Skin:  Positive for rash.        Objective:    BP 132/80 (Cuff Size: Large)   Pulse 79   Temp 98.5 F (36.9 C) (Oral)   Resp 16   SpO2 96%  BP Readings from Last 3 Encounters:  04/11/24 132/80  03/27/24 128/76  03/23/24 134/78   Wt Readings from Last 3 Encounters:  03/06/24 185 lb 1.6 oz (84 kg)  01/12/24 185 lb 8 oz (84.1 kg)  12/01/23 183 lb 11.2 oz (83.3 kg)      Physical Exam Constitutional:      Appearance: Normal appearance.  HENT:     Head: Normocephalic and atraumatic.  Eyes:     Conjunctiva/sclera: Conjunctivae normal.  Cardiovascular:     Rate and Rhythm: Normal rate and regular rhythm.  Pulmonary:     Effort: Pulmonary  effort is normal.     Breath sounds: Normal breath sounds.  Musculoskeletal:     Comments: Edema improved in the ankles but superficially vein on the left lower extremity slightly engorged/swollen  Skin:    General: Skin is warm and dry.     Findings: Rash present.     Comments: Erythema still present but much improved.   Neurological:     General: No focal deficit present.     Mental Status: She is alert. Mental status is at baseline.  Psychiatric:        Mood and Affect: Mood normal.        Behavior: Behavior normal.     No results found for any visits on 04/11/24.      Assessment & Plan:   Assessment & Plan  Cellulitis with venous insufficiency Cellulitis improving with antibiotics and diuretics. Venous insufficiency complicates recovery. No adverse effects from doxycycline . Pain improving. Explained prolonged resolution due to fluid retention. Lasix  requires kidney monitoring. - Check kidney function for Lasix  safety. - Continue doxycycline  for one week. - Continue Lasix  for one week, contingent on kidney function. - Schedule follow-up in one week, consider virtual visit.  - Basic Metabolic Panel (BMET) - doxycycline  (VIBRA -TABS) 100 MG tablet; Take 1 tablet (100 mg total) by mouth 2 (two) times  daily for 7 days.  Dispense: 14 tablet; Refill: 0   Return in about 1 week (around 04/18/2024) for can be virtual, whichever is easier .  Sharyle Fischer, DO

## 2024-04-12 ENCOUNTER — Ambulatory Visit: Payer: Self-pay | Admitting: Internal Medicine

## 2024-04-12 DIAGNOSIS — L03116 Cellulitis of left lower limb: Secondary | ICD-10-CM

## 2024-04-12 DIAGNOSIS — R600 Localized edema: Secondary | ICD-10-CM

## 2024-04-12 LAB — BASIC METABOLIC PANEL WITH GFR
BUN: 21 mg/dL (ref 7–25)
CO2: 29 mmol/L (ref 20–32)
Calcium: 9.3 mg/dL (ref 8.6–10.4)
Chloride: 106 mmol/L (ref 98–110)
Creat: 0.94 mg/dL (ref 0.60–0.95)
Glucose, Bld: 171 mg/dL — ABNORMAL HIGH (ref 65–99)
Potassium: 4.6 mmol/L (ref 3.5–5.3)
Sodium: 144 mmol/L (ref 135–146)
eGFR: 60 mL/min/1.73m2 (ref >=60)

## 2024-04-12 MED ORDER — FUROSEMIDE 20 MG PO TABS: 20.0000 mg | ORAL_TABLET | Freq: Every day | ORAL | 0 refills | Status: DC

## 2024-04-17 ENCOUNTER — Other Ambulatory Visit: Payer: Self-pay

## 2024-04-17 ENCOUNTER — Telehealth (INDEPENDENT_AMBULATORY_CARE_PROVIDER_SITE_OTHER): Admitting: Internal Medicine

## 2024-04-17 DIAGNOSIS — L03116 Cellulitis of left lower limb: Secondary | ICD-10-CM

## 2024-04-17 DIAGNOSIS — R600 Localized edema: Secondary | ICD-10-CM

## 2024-04-17 MED ORDER — FUROSEMIDE 20 MG PO TABS: 20.0000 mg | ORAL_TABLET | Freq: Every day | ORAL | 0 refills | Status: DC

## 2024-04-17 MED ORDER — DOXYCYCLINE HYCLATE 100 MG PO TABS: 100.0000 mg | ORAL_TABLET | Freq: Two times a day (BID) | ORAL | 0 refills | Status: AC

## 2024-04-17 NOTE — Progress Notes (Signed)
 Virtual Visit via Video Note  I connected with Lindsey [name] on 04/17/24 at 11:20 AM EDT by a video enabled telemedicine application and verified that I am speaking with the correct person using two identifiers.  Location: Patient: Home Provider: [facility]   I discussed the limitations of evaluation and management by telemedicine and the availability of in person appointments. The patient expressed understanding and agreed to proceed.  History of Present Illness:  Patient presenting for follow up on her lower extremity cellulitis and swelling.   Discussed the use of AI scribe software for clinical note transcription with the patient, who gave verbal consent to proceed.  History of Present Illness Lindsey [name] is an 83 year old female who presents with persistent leg pain and swelling.  She has experienced leg pain and swelling for several weeks. Swelling has decreased, but redness and pain persist. She is currently on doxycycline , taking two pills daily for three weeks, with six pills remaining. Her kidney function is good. She has not used Tylenol  since starting antibiotics and is considering topical creams like Aspirin cream, Voltaren, or Bengay for pain relief.    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 Continued treatment necessary to prevent exacerbation. - Continue doxycycline  for another week. - Refill doxycycline  prescription. - Continue Lasix  for another week. - Allow use of Tylenol  for pain management. - Permit use of topical creams such as Aspirin cream, Voltaren, or Bengay, provided  there are no open wounds. - Schedule a follow-up virtual visit next week to reassess the need for continued antibiotics.  - furosemide  (LASIX ) 20 MG tablet; Take 1 tablet (20 mg total) by mouth daily for 7 days.  Dispense: 7 tablet; Refill: 0 - doxycycline  (VIBRA -TABS) 100 MG tablet; Take 1 tablet (100 mg total) by mouth 2 (two) times daily for 7 days.  Dispense: 14 tablet; Refill: 0   Follow Up Instructions: 1 week     I discussed the assessment and treatment plan with the patient. The patient was provided an opportunity to ask questions and all were answered. The patient agreed with the plan and demonstrated an understanding of the instructions.   The patient was advised to call back or seek an in-person evaluation if the symptoms worsen or if the condition fails to improve as anticipated.  I provided 15 minutes of non-face-to-face time during this encounter.   Sharyle Fischer, DO

## 2024-04-24 ENCOUNTER — Telehealth (INDEPENDENT_AMBULATORY_CARE_PROVIDER_SITE_OTHER): Admitting: Internal Medicine

## 2024-04-24 ENCOUNTER — Encounter: Payer: Self-pay | Admitting: Internal Medicine

## 2024-04-24 ENCOUNTER — Other Ambulatory Visit: Payer: Self-pay

## 2024-04-24 DIAGNOSIS — L03116 Cellulitis of left lower limb: Secondary | ICD-10-CM | POA: Diagnosis not present

## 2024-04-24 NOTE — Progress Notes (Signed)
 Virtual Visit via Video Note  I connected with Lindsey [name] on 04/24/24 at 11:20 AM EDT by a video enabled telemedicine application and verified that I am speaking with the correct person using two identifiers.  Location: Patient: Home Provider: [facility]   I discussed the limitations of evaluation and management by telemedicine and the availability of in person appointments. The patient expressed understanding and agreed to proceed.  History of Present Illness:  Patient presenting for follow up on her lower extremity cellulitis and swelling.   Discussed the use of AI scribe software for clinical note transcription with the patient, who gave verbal consent to proceed.  History of Present Illness Lindsey [name] is an 83 year old female who presents with improvement in leg pain and redness following cellulitis treatment.  Leg pain has significantly improved and is now only occasional. Redness in the leg has also improved but remains slightly red. She is currently taking doxycycline  for cellulitis, with ten pills remaining, taken twice daily. She uses Lasix  for swelling and is monitoring her leg for any recurrence of swelling.    Observations/Objective:  General: well appearing, no acute distress ENT: conjunctiva normal appearing bilaterally  Skin: redness mild, swelling resolved Neuro: answers all questions appropriately   Assessment and Plan:  Assessment & Plan Cellulitis of lower limb Cellulitis improving with doxycycline . Pain decreased, redness resolving. Concern for recurrence if treatment stopped early. - Continue doxycycline  for 5 more days. - Monitor for worsening symptoms post-antibiotics and contact provider if needed.  Edema of leg - Discontinue daily Lasix . - Use Lasix  as needed for recurrent swelling.  Follow Up Instructions: already scheduled 9/15    I discussed the assessment and treatment plan with the patient. The patient was provided an opportunity to ask questions  and all were answered. The patient agreed with the plan and demonstrated an understanding of the instructions.   The patient was advised to call back or seek an in-person evaluation if the symptoms worsen or if the condition fails to improve as anticipated.  I provided 5 minutes of non-face-to-face time during this encounter.   Sharyle Fischer, DO

## 2024-05-07 ENCOUNTER — Ambulatory Visit: Admitting: Internal Medicine

## 2024-05-11 ENCOUNTER — Ambulatory Visit (INDEPENDENT_AMBULATORY_CARE_PROVIDER_SITE_OTHER): Admitting: Internal Medicine

## 2024-05-11 ENCOUNTER — Other Ambulatory Visit: Payer: Self-pay

## 2024-05-11 ENCOUNTER — Encounter: Payer: Self-pay | Admitting: Internal Medicine

## 2024-05-11 VITALS — BP 128/76 | HR 87 | Temp 98.3°F | Resp 16 | Ht 61.5 in | Wt 185.4 lb

## 2024-05-11 DIAGNOSIS — Z23 Encounter for immunization: Secondary | ICD-10-CM | POA: Diagnosis not present

## 2024-05-11 DIAGNOSIS — E1129 Type 2 diabetes mellitus with other diabetic kidney complication: Secondary | ICD-10-CM | POA: Diagnosis not present

## 2024-05-11 DIAGNOSIS — E785 Hyperlipidemia, unspecified: Secondary | ICD-10-CM

## 2024-05-11 DIAGNOSIS — E039 Hypothyroidism, unspecified: Secondary | ICD-10-CM | POA: Diagnosis not present

## 2024-05-11 DIAGNOSIS — F339 Major depressive disorder, recurrent, unspecified: Secondary | ICD-10-CM

## 2024-05-11 DIAGNOSIS — Z794 Long term (current) use of insulin: Secondary | ICD-10-CM

## 2024-05-11 DIAGNOSIS — E1169 Type 2 diabetes mellitus with other specified complication: Secondary | ICD-10-CM | POA: Diagnosis not present

## 2024-05-11 DIAGNOSIS — I10 Essential (primary) hypertension: Secondary | ICD-10-CM

## 2024-05-11 DIAGNOSIS — R809 Proteinuria, unspecified: Secondary | ICD-10-CM | POA: Diagnosis not present

## 2024-05-11 MED ORDER — ATORVASTATIN CALCIUM 40 MG PO TABS: 40.0000 mg | ORAL_TABLET | Freq: Every day | ORAL | 1 refills | Status: AC

## 2024-05-11 MED ORDER — LEVOTHYROXINE SODIUM 112 MCG PO TABS: 112.0000 ug | ORAL_TABLET | Freq: Every day | ORAL | 1 refills | Status: AC

## 2024-05-11 MED ORDER — SERTRALINE HCL 50 MG PO TABS: 50.0000 mg | ORAL_TABLET | Freq: Every day | ORAL | 1 refills | Status: AC

## 2024-05-11 MED ORDER — LISINOPRIL 40 MG PO TABS
40.0000 mg | ORAL_TABLET | Freq: Every day | ORAL | 1 refills | Status: AC
Start: 2024-05-11 — End: ?

## 2024-05-11 NOTE — Progress Notes (Signed)
 Established Patient Office Visit  Subjective   Patient ID: Lindsey [name], female    DOB: 20-May-1941  Age: 83 y.o. MRN: [id number]  Chief Complaint  Patient presents with   Medical Management of Chronic Issues    6 month recheck    HPI  Patient is here to follow up on chronic medical conditions. She is here with her granddaughter. Leg is much better now.   Discussed the use of AI scribe software for clinical note transcription with the patient, who gave verbal consent to proceed.  History of Present Illness Lindsey [name] is an 83 year old female with diabetes and hypertension who presents for a chronic checkup and medication review.  Diabetes management includes Tresiba  at 10 units, with regular blood sugar monitoring. Recent readings have improved, with evening levels of 120-130 mg/dL. Her last A1c in March was 7.5%. She has discontinued amlodipine  and Lasix . She takes Lipitor for cholesterol.  She is on Zoloft  for mood, which she finds effective. Neuropathy is present, particularly in her heels, and she has a heel spur causing pain and discomfort.   Hypertension: -Medications: Lisinopril  40 mg -Had been on Amlodipine  but discontinued -Patient is compliant with above medications and reports no side effects. -Checking BP at home (average): not checking  -Denies any SOB, CP, vision changes, LE edema   HLD: -Medications: Lipitor 40 mg -Patient is compliant with above medications and reports no side effects.  -Last lipid panel: Lipid Panel     Component Value Date/Time   CHOL 149 05/06/2023 1045   CHOL 151 12/29/2015 0954   TRIG 116 05/06/2023 1045   HDL 49 (L) 05/06/2023 1045   HDL 53 12/29/2015 0954   CHOLHDL 3.0 05/06/2023 1045   VLDL 16 01/27/2017 1128   LDLCALC 79 05/06/2023 1045   LABVLDL 24 12/29/2015 0954   Diabetes, Type 2: -Last A1c 3/25 7.5% -Medications: Tresiba  10 units -Patient is compliant with the above medications and reports no side effects.  -Checking  BG at home: higher recently - last night 120-130 -Eye exam: Due -Foot exam: Due -Microalbumin: Due -Statin: yes -PNA vaccine: UTD -Denies symptoms of hypoglycemia, polyuria, polydipsia, numbness extremities, foot ulcers/trauma.   Hypothyroidism: -Medications: Levothyroxine  112 mcg -Patient is compliant with the above medication (s) at the above dose and reports no medication side effects.  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, stable   MDD: -Mood status: stable -Current treatment: Zoloft  50 mg  -Satisfied with current treatment?: yes -Duration of current treatment : chronic -Side effects: no Medication compliance: excellent compliance     01/12/2024   11:29 AM 11/03/2023    9:59 AM 05/06/2023   10:07 AM 01/06/2023   11:58 AM 11/02/2022    3:16 PM  Depression screen PHQ 2/9  Decreased Interest 1 0 0 1 1  Down, Depressed, Hopeless 1 0 1 1 1   PHQ - 2 Score 2 0 1 2 2   Altered sleeping 0  1 2 0  Tired, decreased energy 1  2 0 0  Change in appetite 0  0 0 0  Feeling bad or failure about yourself  0  1 0 0  Trouble concentrating 0  0 0 0  Moving slowly or fidgety/restless 0  0 0 0  Suicidal thoughts 0  0 0 0  PHQ-9 Score 3  5  4 2  Difficult doing work/chores Not difficult at all  Not difficult at all Somewhat difficult Not difficult at all     Review of Systems  All other systems reviewed and are negative.     Objective:     BP 128/76 (Cuff Size: Large)   Pulse 87   Temp 98.3 F (36.8 C) (Oral)   Resp 16   Ht 5' 1.5 (1.562 m)   Wt 185 lb 6.4 oz (84.1 kg)   SpO2 99%   BMI 34.46 kg/m  BP Readings from Last 3 Encounters:  04/11/24 132/80  03/27/24 128/76  03/23/24 134/78   Wt Readings from Last 3 Encounters:  05/11/24 185 lb 6.4 oz (84.1 kg)  03/06/24 185 lb 1.6 oz (84 kg)  01/12/24  185 lb 8 oz (84.1 kg)      Physical Exam Constitutional:      Appearance: Normal appearance.  HENT:     Head: Normocephalic and atraumatic.  Eyes:     Conjunctiva/sclera: Conjunctivae normal.  Cardiovascular:     Rate and Rhythm: Normal rate and regular rhythm.     Pulses:          Dorsalis pedis pulses are 2+ on the right side and 2+ on the left side.  Pulmonary:     Effort: Pulmonary effort is normal.     Breath sounds: Normal breath sounds.  Musculoskeletal:     Right foot: Normal range of motion. No deformity, bunion, Charcot foot, foot drop or prominent metatarsal heads.     Left foot: Normal range of motion. No deformity, bunion, Charcot foot, foot drop or prominent metatarsal heads.  Feet:     Right foot:     Protective Sensation: 6 sites tested.  4 sites sensed.     Skin integrity: Skin integrity normal.     Toenail Condition: Right toenails are normal.     Left foot:     Protective Sensation: 6 sites tested.  5 sites sensed.     Skin integrity: Skin integrity normal.     Toenail Condition: Left toenails are normal.  Skin:    General: Skin is warm and dry.  Neurological:     General: No focal deficit present.     Mental Status: She is alert. Mental status is at baseline.  Psychiatric:        Mood and Affect: Mood normal.        Behavior: Behavior normal.      No results found for any visits on 05/11/24.   Last CBC Lab Results  Component Value Date   WBC 4.1 03/23/2024   HGB 11.9 (L) 03/23/2024   HCT 37.5 03/23/2024   MCV 89.7 03/23/2024   MCH 28.5 03/23/2024   RDW 14.1 03/23/2024   PLT 237 03/23/2024   Last metabolic panel Lab Results  Component Value Date   GLUCOSE 171 (H) 04/11/2024   NA 144 04/11/2024   K 4.6 04/11/2024   CL 106 04/11/2024   CO2 29 04/11/2024   BUN 21 04/11/2024   CREATININE 0.94 04/11/2024   EGFR 60 04/11/2024   CALCIUM  9.3 04/11/2024   PHOS 3.9 03/31/2020   PROT 6.7 05/06/2023   ALBUMIN 4.4 09/30/2016   LABGLOB 2.6  01/16/2016   AGRATIO 1.7 01/16/2016   BILITOT 1.0 05/06/2023   ALKPHOS 50 09/30/2016   AST 16 05/06/2023   ALT 14 05/06/2023   ANIONGAP 12 03/23/2024   Last lipids Lab Results  Component Value Date   CHOL  149 05/06/2023   HDL 49 (L) 05/06/2023   LDLCALC 79 05/06/2023   TRIG 116 05/06/2023   CHOLHDL 3.0 05/06/2023   Last hemoglobin A1c Lab Results  Component Value Date   HGBA1C 7.5 (A) 11/03/2023   Last thyroid  functions Lab Results  Component Value Date   TSH 0.95 05/06/2023   Last vitamin D  Lab Results  Component Value Date   VD25OH 40 11/24/2017   Last vitamin B12 and Folate Lab Results  Component Value Date   VITAMINB12 597 05/06/2023   FOLATE >24.0 02/19/2022      The ASCVD Risk score (Arnett DK, et al., 2019) failed to calculate for the following reasons:   The 2019 ASCVD risk score is only valid for ages 35 to 85    Assessment & Plan:   Assessment & Plan 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 Blood sugar stabilized. Neuropathy symptoms unlikely related to heel spur. Kidney function requires monitoring. - Order A1c test. - Order urine test for kidney function. - Perform foot exam. - Continue Tresiba  10 units. - Encourage use of leg exerciser.  Essential hypertension Blood pressure controlled at 128/76 mmHg. Amlodipine  discontinued, lisinopril  continued. - Continue lisinopril . - Discontinue amlodipine .  Hypothyroidism Thyroid  function to be assessed. Current management stable. - Order thyroid  function test. - Continue current thyroid  medication.  Dyslipidemia Cholesterol controlled. Lipitor due for refill. Non-fasting cholesterol test to be conducted. - Order cholesterol test. - Continue Lipitor.  Depression Mood stable with Zoloft . - Continue Zoloft .  General Health Maintenance Annual labs and medication review due. Encouraged physical activity. - Order  annual labs. - Review and refill medications as needed.  - HgB A1c - Urine Microalbumin w/creat. ratio - HM Diabetes Foot Exam - Comprehensive Metabolic Panel (CMET) - CBC w/Diff/Platelet - lisinopril  (ZESTRIL ) 40 MG tablet; Take 1 tablet (40 mg total) by mouth at bedtime.  Dispense: 90 tablet; Refill: 1 - Lipid Profile - atorvastatin  (LIPITOR) 40 MG tablet; Take 1 tablet (40 mg total) by mouth daily.  Dispense: 90 tablet; Refill: 1 - TSH - levothyroxine  (SYNTHROID ) 112 MCG tablet; Take 1 tablet (112 mcg total) by mouth daily before breakfast.  Dispense: 90 tablet; Refill: 1 - sertraline  (ZOLOFT ) 50 MG tablet; Take 1 tablet (50 mg total) by mouth daily.  Dispense: 90 tablet; Refill: 1 - Flu vaccine HIGH DOSE PF(Fluzone Trivalent)   Return in about 6 months (around 11/08/2024).    Sharyle Fischer, DO

## 2024-05-12 LAB — CBC WITH DIFFERENTIAL/PLATELET
Absolute Lymphocytes: 1744 {cells}/uL (ref 850–3900)
Absolute Monocytes: 362 {cells}/uL (ref 200–950)
Basophils Absolute: 41 {cells}/uL (ref 0–200)
Basophils Relative: 0.8 %
Eosinophils Absolute: 199 {cells}/uL (ref 15–500)
Eosinophils Relative: 3.9 %
HCT: 40.3 % (ref 35.0–45.0)
Hemoglobin: 13.1 g/dL (ref 11.7–15.5)
MCH: 28.2 pg (ref 27.0–33.0)
MCHC: 32.5 g/dL (ref 32.0–36.0)
MCV: 86.9 fL (ref 80.0–100.0)
MPV: 12.2 fL (ref 7.5–12.5)
Monocytes Relative: 7.1 %
Neutro Abs: 2754 {cells}/uL (ref 1500–7800)
Neutrophils Relative %: 54 %
Platelets: 168 Thousand/uL (ref 140–400)
RBC: 4.64 Million/uL (ref 3.80–5.10)
RDW: 13.8 % (ref 11.0–15.0)
Total Lymphocyte: 34.2 %
WBC: 5.1 Thousand/uL (ref 3.8–10.8)

## 2024-05-12 LAB — COMPREHENSIVE METABOLIC PANEL WITH GFR
AG Ratio: 2.1 (calc) (ref 1.0–2.5)
ALT: 14 U/L (ref 6–29)
AST: 17 U/L (ref 10–35)
Albumin: 4.6 g/dL (ref 3.6–5.1)
Alkaline phosphatase (APISO): 47 U/L (ref 37–153)
BUN: 19 mg/dL (ref 7–25)
CO2: 27 mmol/L (ref 20–32)
Calcium: 9.6 mg/dL (ref 8.6–10.4)
Chloride: 107 mmol/L (ref 98–110)
Creat: 0.91 mg/dL (ref 0.60–0.95)
Globulin: 2.2 g/dL (ref 1.9–3.7)
Glucose, Bld: 151 mg/dL — ABNORMAL HIGH (ref 65–99)
Potassium: 4.5 mmol/L (ref 3.5–5.3)
Sodium: 143 mmol/L (ref 135–146)
Total Bilirubin: 0.6 mg/dL (ref 0.2–1.2)
Total Protein: 6.8 g/dL (ref 6.1–8.1)
eGFR: 62 mL/min/1.73m2 (ref >=60)

## 2024-05-12 LAB — MICROALBUMIN / CREATININE URINE RATIO
Creatinine, Urine: 117 mg/dL (ref 20–275)
Microalb Creat Ratio: 24 mg/g{creat} (ref <30)
Microalb, Ur: 2.8 mg/dL

## 2024-05-12 LAB — LIPID PANEL
Cholesterol: 126 mg/dL (ref <200)
HDL: 53 mg/dL (ref >=50)
LDL Cholesterol (Calc): 57 mg/dL
Non-HDL Cholesterol (Calc): 73 mg/dL (ref <130)
Total CHOL/HDL Ratio: 2.4 (calc) (ref <5.0)
Triglycerides: 84 mg/dL (ref <150)

## 2024-05-12 LAB — HEMOGLOBIN A1C
Hgb A1c MFr Bld: 7.8 % — ABNORMAL HIGH (ref <5.7)
Mean Plasma Glucose: 177 mg/dL
eAG (mmol/L): 9.8 mmol/L

## 2024-05-12 LAB — TSH: TSH: 1.22 m[IU]/L (ref 0.40–4.50)

## 2024-05-14 ENCOUNTER — Ambulatory Visit: Payer: Self-pay | Admitting: Internal Medicine

## 2024-06-05 DIAGNOSIS — E113393 Type 2 diabetes mellitus with moderate nonproliferative diabetic retinopathy without macular edema, bilateral: Secondary | ICD-10-CM | POA: Diagnosis not present

## 2024-07-03 ENCOUNTER — Ambulatory Visit: Admitting: Podiatry

## 2024-07-03 DIAGNOSIS — B351 Tinea unguium: Secondary | ICD-10-CM | POA: Diagnosis not present

## 2024-07-03 DIAGNOSIS — M79675 Pain in left toe(s): Secondary | ICD-10-CM

## 2024-07-03 DIAGNOSIS — R809 Proteinuria, unspecified: Secondary | ICD-10-CM

## 2024-07-03 DIAGNOSIS — M79674 Pain in right toe(s): Secondary | ICD-10-CM

## 2024-07-03 DIAGNOSIS — E1129 Type 2 diabetes mellitus with other diabetic kidney complication: Secondary | ICD-10-CM

## 2024-07-03 DIAGNOSIS — Z794 Long term (current) use of insulin: Secondary | ICD-10-CM | POA: Diagnosis not present

## 2024-07-03 NOTE — Progress Notes (Signed)

## 2024-07-12 ENCOUNTER — Other Ambulatory Visit: Payer: Self-pay | Admitting: Internal Medicine

## 2024-07-12 DIAGNOSIS — E1129 Type 2 diabetes mellitus with other diabetic kidney complication: Secondary | ICD-10-CM

## 2024-07-14 NOTE — Telephone Encounter (Signed)
 Requested Prescriptions  Pending Prescriptions Disp Refills   TRESIBA  FLEXTOUCH 100 UNIT/ML FlexTouch Pen [Pharmacy Med Name: TRESIBA  FLEXTOUCH PEN ([id number]] 18 mL 0    Sig: INJECT 10 UNITS INTO THE SKIN EVERY DAY     Endocrinology:  Diabetes - Insulins Passed - 07/14/2024  8:27 AM      Passed - HBA1C is between 0 and 7.9 and within 180 days    Hgb A1c MFr Bld  Date Value Ref Range Status  05/11/2024 7.8 (H) <5.7 % Final    Comment:    For someone without known diabetes, a hemoglobin A1c value of 6.5% or greater indicates that they [place] have  diabetes and this should be confirmed with a follow-up  test. . For someone with known diabetes, a value <7% indicates  that their diabetes is well controlled and a value  greater than or equal to 7% indicates suboptimal  control. A1c targets should be individualized based on  duration of diabetes, age, comorbid conditions, and  other considerations. . Currently, no consensus exists regarding use of hemoglobin A1c for diagnosis of diabetes for children. SABRA Amy - Valid encounter within last 6 months    Recent Outpatient Visits           2 months ago Type 2 diabetes mellitus with microalbuminuria, with long-term current use of insulin  [facility])   [facility] Bernardo Fend, DO   2 months ago Cellulitis of left lower extremity   [facility] Bernardo Fend, DO   2 months ago Cellulitis of left lower extremity   [facility] Bernardo Fend, DO   3 months ago Peripheral edema   [facility] Bernardo Fend, DO   3 months ago Peripheral edema   [facility] Bernardo Fend, DO               BD PEN NEEDLE MINI ULTRAFINE 31G X 5 MM MISC [Pharmacy Med Name: B-D PEN NDL MINI 31GX5MM(3/16)PRPL] 100 each 0    Sig: USE TO INJECT INSULIN  EVERY DAY     Endocrinology: Diabetes -  Testing Supplies Passed - 07/14/2024  8:27 AM      Passed - Valid encounter within last 12 months    Recent Outpatient Visits           2 months ago Type 2 diabetes mellitus with microalbuminuria, with long-term current use of insulin  [facility])   [facility] Bernardo Fend, DO   2 months ago Cellulitis of left lower extremity   [facility] Bernardo Fend, DO   2 months ago Cellulitis of left lower extremity   [facility] Bernardo Fend, DO   3 months ago Peripheral edema   [facility] Bernardo Fend, DO   3 months ago Peripheral edema   [facility] Bernardo Fend, [place]

## 2024-10-03 ENCOUNTER — Ambulatory Visit: Admitting: Podiatry

## 2024-11-08 ENCOUNTER — Ambulatory Visit: Admitting: Internal Medicine

## 2025-01-17 ENCOUNTER — Ambulatory Visit

## 2025-01-18 ENCOUNTER — Ambulatory Visit

## 2025-01-25 ENCOUNTER — Ambulatory Visit
